# Patient Record
Sex: Male | Born: 1945
Health system: Southern US, Community
[De-identification: ages and names within clinical notes are randomized; demographics above are authoritative.]

## PROBLEM LIST (undated history)

## (undated) DIAGNOSIS — J449 Chronic obstructive pulmonary disease, unspecified: Secondary | ICD-10-CM

## (undated) DIAGNOSIS — Z5181 Encounter for therapeutic drug level monitoring: Secondary | ICD-10-CM

## (undated) DIAGNOSIS — K2970 Gastritis, unspecified, without bleeding: Secondary | ICD-10-CM

## (undated) DIAGNOSIS — I714 Abdominal aortic aneurysm, without rupture, unspecified: Secondary | ICD-10-CM

## (undated) DIAGNOSIS — I2510 Atherosclerotic heart disease of native coronary artery without angina pectoris: Secondary | ICD-10-CM

## (undated) DIAGNOSIS — I4891 Unspecified atrial fibrillation: Secondary | ICD-10-CM

## (undated) DIAGNOSIS — I10 Essential (primary) hypertension: Secondary | ICD-10-CM

## (undated) DIAGNOSIS — I499 Cardiac arrhythmia, unspecified: Secondary | ICD-10-CM

## (undated) DIAGNOSIS — F419 Anxiety disorder, unspecified: Secondary | ICD-10-CM

## (undated) DIAGNOSIS — Z79899 Other long term (current) drug therapy: Secondary | ICD-10-CM

## (undated) DIAGNOSIS — Z8719 Personal history of other diseases of the digestive system: Secondary | ICD-10-CM

## (undated) DIAGNOSIS — R51 Headache: Secondary | ICD-10-CM

## (undated) DIAGNOSIS — I739 Peripheral vascular disease, unspecified: Secondary | ICD-10-CM

## (undated) HISTORY — PX: INCISION AND DRAINAGE DEEP NECK ABSCESS: SHX1797

## (undated) HISTORY — PX: CORONARY ARTERY BYPASS GRAFT: SHX141

## (undated) HISTORY — DX: Headache: R51

## (undated) HISTORY — DX: Peripheral vascular disease, unspecified: I73.9

## (undated) HISTORY — DX: Chronic obstructive pulmonary disease, unspecified: J44.9

## (undated) HISTORY — DX: Atherosclerotic heart disease of native coronary artery without angina pectoris: I25.10

## (undated) HISTORY — DX: Essential (primary) hypertension: I10

## (undated) HISTORY — DX: Gastritis, unspecified, without bleeding: K29.70

## (undated) HISTORY — PX: ROTATOR CUFF REPAIR: SHX139

---

## 1999-05-30 ENCOUNTER — Emergency Department (HOSPITAL_COMMUNITY): Admission: EM | Admit: 1999-05-30 | Discharge: 1999-05-30 | Payer: Self-pay | Admitting: Emergency Medicine

## 1999-05-31 ENCOUNTER — Encounter: Payer: Self-pay | Admitting: Emergency Medicine

## 1999-06-08 ENCOUNTER — Ambulatory Visit (HOSPITAL_COMMUNITY): Admission: RE | Admit: 1999-06-08 | Discharge: 1999-06-08 | Payer: Self-pay | Admitting: *Deleted

## 1999-06-10 ENCOUNTER — Encounter: Payer: Self-pay | Admitting: *Deleted

## 1999-06-10 ENCOUNTER — Ambulatory Visit (HOSPITAL_COMMUNITY): Admission: RE | Admit: 1999-06-10 | Discharge: 1999-06-10 | Payer: Self-pay | Admitting: *Deleted

## 2001-02-01 ENCOUNTER — Encounter: Payer: Self-pay | Admitting: Family Medicine

## 2001-02-01 ENCOUNTER — Ambulatory Visit (HOSPITAL_COMMUNITY): Admission: RE | Admit: 2001-02-01 | Discharge: 2001-02-01 | Payer: Self-pay | Admitting: Family Medicine

## 2005-06-07 ENCOUNTER — Ambulatory Visit: Payer: Self-pay | Admitting: Orthopedic Surgery

## 2005-10-03 ENCOUNTER — Ambulatory Visit (HOSPITAL_COMMUNITY): Admission: RE | Admit: 2005-10-03 | Discharge: 2005-10-03 | Payer: Self-pay | Admitting: Family Medicine

## 2006-08-28 DIAGNOSIS — I2510 Atherosclerotic heart disease of native coronary artery without angina pectoris: Secondary | ICD-10-CM

## 2006-08-28 HISTORY — DX: Atherosclerotic heart disease of native coronary artery without angina pectoris: I25.10

## 2007-03-05 ENCOUNTER — Encounter: Payer: Self-pay | Admitting: Cardiothoracic Surgery

## 2007-03-05 ENCOUNTER — Ambulatory Visit: Payer: Self-pay | Admitting: Cardiothoracic Surgery

## 2007-03-05 ENCOUNTER — Inpatient Hospital Stay (HOSPITAL_COMMUNITY): Admission: EM | Admit: 2007-03-05 | Discharge: 2007-03-12 | Payer: Self-pay | Admitting: Emergency Medicine

## 2007-03-05 ENCOUNTER — Ambulatory Visit: Payer: Self-pay | Admitting: Cardiology

## 2007-04-12 ENCOUNTER — Ambulatory Visit: Payer: Self-pay | Admitting: Cardiothoracic Surgery

## 2007-04-12 ENCOUNTER — Encounter: Admission: RE | Admit: 2007-04-12 | Discharge: 2007-04-12 | Payer: Self-pay | Admitting: Cardiothoracic Surgery

## 2007-09-09 ENCOUNTER — Encounter: Admission: RE | Admit: 2007-09-09 | Discharge: 2007-09-09 | Payer: Self-pay | Admitting: Cardiology

## 2007-10-11 ENCOUNTER — Ambulatory Visit (HOSPITAL_COMMUNITY): Admission: RE | Admit: 2007-10-11 | Discharge: 2007-10-11 | Payer: Self-pay | Admitting: Family Medicine

## 2008-02-07 ENCOUNTER — Ambulatory Visit (HOSPITAL_COMMUNITY): Admission: RE | Admit: 2008-02-07 | Discharge: 2008-02-07 | Payer: Self-pay | Admitting: Family Medicine

## 2008-02-26 ENCOUNTER — Ambulatory Visit (HOSPITAL_COMMUNITY): Admission: RE | Admit: 2008-02-26 | Discharge: 2008-02-26 | Payer: Self-pay | Admitting: Family Medicine

## 2008-02-27 ENCOUNTER — Emergency Department (HOSPITAL_COMMUNITY): Admission: EM | Admit: 2008-02-27 | Discharge: 2008-02-27 | Payer: Self-pay | Admitting: Emergency Medicine

## 2008-04-16 ENCOUNTER — Ambulatory Visit (HOSPITAL_BASED_OUTPATIENT_CLINIC_OR_DEPARTMENT_OTHER): Admission: RE | Admit: 2008-04-16 | Discharge: 2008-04-17 | Payer: Self-pay | Admitting: Orthopedic Surgery

## 2008-09-15 ENCOUNTER — Encounter (INDEPENDENT_AMBULATORY_CARE_PROVIDER_SITE_OTHER): Payer: Self-pay | Admitting: Cardiology

## 2008-09-15 ENCOUNTER — Ambulatory Visit (HOSPITAL_COMMUNITY): Admission: RE | Admit: 2008-09-15 | Discharge: 2008-09-15 | Payer: Self-pay | Admitting: Cardiology

## 2008-09-15 ENCOUNTER — Ambulatory Visit: Payer: Self-pay | Admitting: Vascular Surgery

## 2008-11-25 ENCOUNTER — Ambulatory Visit (HOSPITAL_COMMUNITY): Admission: RE | Admit: 2008-11-25 | Discharge: 2008-11-25 | Payer: Self-pay | Admitting: Family Medicine

## 2009-01-14 ENCOUNTER — Ambulatory Visit: Payer: Self-pay | Admitting: *Deleted

## 2009-09-15 ENCOUNTER — Inpatient Hospital Stay (HOSPITAL_COMMUNITY): Admission: AD | Admit: 2009-09-15 | Discharge: 2009-09-18 | Payer: Self-pay

## 2009-09-16 ENCOUNTER — Encounter (INDEPENDENT_AMBULATORY_CARE_PROVIDER_SITE_OTHER): Payer: Self-pay

## 2010-03-14 ENCOUNTER — Ambulatory Visit (HOSPITAL_COMMUNITY)
Admission: RE | Admit: 2010-03-14 | Discharge: 2010-03-14 | Payer: Self-pay | Source: Home / Self Care | Admitting: Cardiology

## 2010-09-27 ENCOUNTER — Ambulatory Visit (HOSPITAL_COMMUNITY)
Admission: RE | Admit: 2010-09-27 | Discharge: 2010-09-27 | Payer: Self-pay | Source: Home / Self Care | Attending: Family Medicine | Admitting: Family Medicine

## 2010-10-26 ENCOUNTER — Encounter (HOSPITAL_BASED_OUTPATIENT_CLINIC_OR_DEPARTMENT_OTHER)
Admission: RE | Admit: 2010-10-26 | Discharge: 2010-10-26 | Disposition: A | Discharge status: Home / Self Care | Payer: Medicare Other | Source: Ambulatory Visit | Attending: Orthopedic Surgery | Admitting: Orthopedic Surgery

## 2010-10-26 DIAGNOSIS — Z01812 Encounter for preprocedural laboratory examination: Secondary | ICD-10-CM | POA: Insufficient documentation

## 2010-10-26 LAB — BASIC METABOLIC PANEL
Calcium: 9.1 mg/dL (ref 8.4–10.5)
Creatinine, Ser: 1.22 mg/dL (ref 0.4–1.5)
GFR calc non Af Amer: 59 mL/min — ABNORMAL LOW (ref >60)
Glucose, Bld: 85 mg/dL (ref 70–99)

## 2010-10-27 ENCOUNTER — Ambulatory Visit (HOSPITAL_BASED_OUTPATIENT_CLINIC_OR_DEPARTMENT_OTHER)
Admission: RE | Admit: 2010-10-27 | Discharge: 2010-10-28 | Disposition: A | Discharge status: Home / Self Care | Payer: Medicare Other | Source: Ambulatory Visit | Attending: Orthopedic Surgery | Admitting: Orthopedic Surgery

## 2010-10-27 DIAGNOSIS — M67919 Unspecified disorder of synovium and tendon, unspecified shoulder: Secondary | ICD-10-CM | POA: Insufficient documentation

## 2010-10-27 DIAGNOSIS — Z01812 Encounter for preprocedural laboratory examination: Secondary | ICD-10-CM | POA: Insufficient documentation

## 2010-10-27 DIAGNOSIS — Z01818 Encounter for other preprocedural examination: Secondary | ICD-10-CM | POA: Insufficient documentation

## 2010-10-27 DIAGNOSIS — M719 Bursopathy, unspecified: Secondary | ICD-10-CM | POA: Insufficient documentation

## 2010-10-27 DIAGNOSIS — M25819 Other specified joint disorders, unspecified shoulder: Secondary | ICD-10-CM | POA: Insufficient documentation

## 2010-10-27 DIAGNOSIS — Z5333 Arthroscopic surgical procedure converted to open procedure: Secondary | ICD-10-CM | POA: Insufficient documentation

## 2010-10-27 DIAGNOSIS — G8929 Other chronic pain: Secondary | ICD-10-CM | POA: Insufficient documentation

## 2010-10-27 DIAGNOSIS — M19019 Primary osteoarthritis, unspecified shoulder: Secondary | ICD-10-CM | POA: Insufficient documentation

## 2010-10-27 DIAGNOSIS — M6580 Other synovitis and tenosynovitis, unspecified site: Secondary | ICD-10-CM | POA: Insufficient documentation

## 2010-10-27 LAB — POCT HEMOGLOBIN-HEMACUE: Hemoglobin: 14.5 g/dL (ref 13.0–17.0)

## 2010-11-08 NOTE — Op Note (Signed)
NAMEMarland Kitchen  TAVARI, LOADHOLT                ACCOUNT NO.:  [id number]  MEDICAL RECORD NO.:  [id number]           PATIENT TYPE:  LOCATION:                                 FACILITY:  PHYSICIAN:  Katy Fitch. Gillian Kluever, M.D. DATE OF BIRTH:  1946-05-14  DATE OF PROCEDURE:  10/27/2010 DATE OF DISCHARGE:                              OPERATIVE REPORT   PREOPERATIVE DIAGNOSIS:  Chronic right shoulder pain with MRI documented bursal side degenerative delaminated rotator cuff tear due to chronic stage III impingement due to acromioclavicular degenerative arthritis and unfavorable acromial anatomy.  POSTOPERATIVE DIAGNOSES:  Chronic right shoulder pain with MRI documented bursal side degenerative delaminated rotator cuff tear due to chronic stage III impingement due to acromioclavicular degenerative arthritis and unfavorable acromial anatomy with confirmation of 20% biceps degenerative tear and labral degenerative tearing as well as synovitis within glenohumeral joint.  OPERATION: 1. Diagnostic arthroscopy, right glenohumeral joint. 2. Arthroscopic debridement of labrum, biceps, and deep surface of     rotator cuff tear. 3. Arthroscopic subacromial bursectomy, coracoacromial electrosurgical     release, acromioplasty. 4. Arthroscopic distal clavicle resection. 5. Open hybrid repair of supraspinatus, infraspinatus, delaminated     rotator cuff tear utilizing a McLaughlin through bone suture to     anatomically replace the supraspinatus and a FiberTape to a lateral     swivel-lock for reinforcement and compression.  OPERATING SURGEON:  Katy Fitch. Theodore Rahrig, MD  ASSISTANT:  Marveen Reeks Dasnoit, PA-C  ANESTHESIA:  General by endotracheal technique supplemented by a ropivacaine interscalene block placed with ultrasound guidance.  SUPERVISING ANESTHESIOLOGIST:  Janetta Hora. Gelene Mink, MD  INDICATIONS:  [name] is a 65 year old gentleman referred through the courtesy of Dr. Lilyan Punt of [place],  [place] for management of a right rotator cuff tear.  In 2009, we had repaired his left rotator cuff with a very satisfactory result.  With recurrent shoulder pain, he saw Dr. Gerda Diss and had plain x-ray examination and an MRI documenting a retracting supraspinatus degenerative rotator cuff tear with unfavorable AC and acromial anatomy.  [name] was referred back for followup orthopedic consult.  Clinical examination confirmed weakness of abduction and external rotation and positive impingement signs.  Given his anatomic predicament and desire to remain quite active, he requested we proceed with repair of his rotator cuff at this time.  We advised him preoperatively that we would decompress the [facility] joint, the anterior and lateral acromion, debride the necrotic rotator cuff, and repair the rotator cuff to decorticated greater tuberosity with a surgical construct utilizing anchors and through bone suture.  This was similar to the repair that was performed on his left side.  Questions were invited and answered in detail.  [name] was quite familiar with the surgery and aftercare anticipated.  PROCEDURE:  [name] was brought to room 1 at [facility] and placed in supine position on the operating table.  In the holding area, Dr. Gelene Mink had provided detailed anesthesia informed consent and placed an ultrasound-guided interscalene block.  Excellent anesthesia of the right arm and forequarter was achieved.  Under Dr. Thornton Dales direct supervision,  general endotracheal anesthesia was induced followed by careful positioning of Mr. Artola in the beach-chair position with the aid of a torso and head holder designed for shoulder arthroscopy.  The right upper extremity and forequarter was prepped with DuraPrep and draped with impervious arthroscopy drapes.  A routine surgical time-out was accomplished followed by proceeding to use a marking pencil to lay out the  anatomy of the shoulder.  The arthroscope was introduced through a standard posterior viewing portal followed by diagnostic arthroscopy of the glenohumeral joint.  The full- thickness rotator cuff tear was visualized posterior to the biceps tendon.  There was some moderate degree of necrotic tendinopathy noted. The labrum was degenerative superiorly and anteriorly and the biceps had a 20% fraying inferior tear approximately 1 cm from its origin at the superior glenoid.  An anterior portal was created under direct vision followed by use of a 4.2-mm suction shaver to debride the biceps to a stable margin, the labral tissues to a stable margin, and the deep articular side of the rotator cuff tear.  After synovectomy was accomplished, hemostasis was not problematic.  The scope was then removed from glenohumeral joint and placed in subacromial space.  A florid bursitis was noted that was thoroughly debrided with suction shaver.  The cuff tear was noted be delaminated and extending into the mid substance of the infraspinatus.  The capsule of AC joint was taken down the cutting cautery.  The osteophyte of the distal clavicle documented with a digital camera followed by use of the suction bur to remove the distal centimeter of clavicle and to perform anterior and lateral acromioplasty.  Hemostasis was achieved under direct vision with bipolar cautery.  After completion of the acromioplasty, the arthroscopic equipment was removed and an anterior middle third deltoid splitting incision accomplished.  Redundant bursa was removed and the rotator cuff tear inspected.  There was a sizable tear measuring approximately 3 cm from anterior to posterior and the superficial fibers of the supraspinatus had retracted medially at least 3 cm.  The interval between the supraspinatus and infraspinatus was laminating with a part of infraspinatus still attached to the tuberosity.  The margins of the tear were  freshened with a sharp mid-size rongeur and the tuberosity was decorticated with a suction bur with the profile of the tuberosity lowered 3 mm to bleeding cancellous bone.  The supraspinatus was gathered with a FiberTape with scorpion suture passer followed by use of a #2 FiberWire to re-laminate the layers of the supraspinatus and infraspinatus by carefully placing sutures and replacing the layers anatomically.  The #2 FiberWire was then placed through drill holes in bone with McLaughlin through bone technique and after abducting the arm, the supraspinous and infraspinatus tendons were restored to an anatomic footprint of the decorticated tuberosity.  The FiberTape was then woven through the infraspinatus to apply compression to smooth the margin of the repair and an over-the-top technique to a lateral swivel-lock was used for compression of the supraspinatus.  Hemostasis was achieved with bipolar cautery and the Bovie followed by irrigation.  The scope was placed in the glenohumeral joint from posterior approach and diagnostic arthroscopy confirmed that no sutures were following the long head biceps or other predicaments noted.  After intra-articular irrigation, the arthroscopic equipment was removed followed by repair of the portals with intradermal 3-0 Prolene and repair of the deltoid split with simple suture of 0 Vicryl, followed by repair of the subcutaneous tissue with 2-0 Vicryl and the skin with intradermal  3-0 Prolene and Steri-Strips.  For aftercare, Mr. Cindric will be admitted [facility] for observation of his vital signs.  We anticipate Ancef 1 g IV x3 doses and appropriate analgesics in the form of p.o. and IV Dilaudid once his ropivacaine block wears off.     Katy Fitch Penni Penado, M.D.     RVS/MEDQ  D:  10/27/2010  T:  10/28/2010  Job:  [id number]  cc:   Lorin Picket A. Gerda Diss, MD  Electronically Signed by Josephine Igo M.D. on 11/08/2010 01:11:16 PM

## 2010-11-12 LAB — CREATININE, SERUM: Creatinine, Ser: 1.06 mg/dL (ref 0.4–1.5)

## 2010-11-13 LAB — GRAM STAIN

## 2010-11-13 LAB — ANAEROBIC CULTURE

## 2010-11-13 LAB — BASIC METABOLIC PANEL
BUN: 14 mg/dL (ref 6–23)
CO2: 26 mEq/L (ref 19–32)
Calcium: 8.7 mg/dL (ref 8.4–10.5)
Chloride: 102 mEq/L (ref 96–112)
Glucose, Bld: 152 mg/dL — ABNORMAL HIGH (ref 70–99)

## 2010-11-13 LAB — CBC
HCT: 35.3 % — ABNORMAL LOW (ref 39.0–52.0)
Hemoglobin: 12 g/dL — ABNORMAL LOW (ref 13.0–17.0)
MCHC: 34.1 g/dL (ref 30.0–36.0)
RDW: 14.2 % (ref 11.5–15.5)

## 2010-11-13 LAB — DIFFERENTIAL
Eosinophils Absolute: 0.1 10*3/uL (ref 0.0–0.7)
Lymphs Abs: 1.5 10*3/uL (ref 0.7–4.0)
Monocytes Absolute: 0.7 10*3/uL (ref 0.1–1.0)
Neutro Abs: 8.1 10*3/uL — ABNORMAL HIGH (ref 1.7–7.7)

## 2010-11-13 LAB — WOUND CULTURE

## 2011-01-10 NOTE — Assessment & Plan Note (Signed)
OFFICE VISIT   [name], [name]  DOB:  02-12-1946                                        April 12, 2007  CHART #:  [id number]   CURRENT PROBLEMS:  1. Status post CABG x5 on March 06, 2007 for class IV unstable angina      with 3-vessel disease.  2. Hypertension.  3. History of a GI bleed from peptic ulcer disease.  4. COPD with recent smoking cessation 1 to 2 packs a day.   HISTORY OF PRESENT ILLNESS:  The patient returns for his first  postoperative office visit after CABG x5 for unstable angina.  He is  followed by Dr. Corliss Marcus and Dr. Lubertha South in [place].  He had  postoperative atrial fibrillation, but converted to sinus rhythm, and  his amiodarone had been reduced to 1 tablet daily.  He is also on  digoxin, Lopressor 12.5 b.i.d., and iron.  He has had no recurrent  angina and his surgical incisions are healing well.   PHYSICAL EXAM:  Blood pressure 120/70, pulse 60, respirations 18,  saturation 99%.  He is alert and pleasant.  Breath sounds are clear and  equal.  His cardiac rhythm is regular without gallops, murmurs, or rubs.  The sternal incision is well-healed.  The leg incision is well-healed  and there is no peripheral edema.   LABORATORY DATA:  PA and lateral chest x-ray shows clear lung fields, no  pleural effusion, and the sternal wires are well-aligned, and the  mediastinum appears intact.   IMPRESSION AND PLAN:  The patient has done well 1 month following  surgery and is ready to resume normal daily activities.  He knows not to  lift more than 20 pounds until October 1.  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  He will stop the digoxin  as  his heart rate is slow and he has maintained the sinus rhythm.  Otherwise, no prescriptions were requested and the patient will return  here as needed.   Kerin Perna, M.D.  Electronically Signed   PV/MEDQ  D:  04/12/2007  T:  04/13/2007  Job:  [id number]   cc:   Francisca December, M.D.

## 2011-01-10 NOTE — Procedures (Signed)
CAROTID DUPLEX EXAM   INDICATION:  Followup evaluation of known carotid artery disease.   HISTORY:  Diabetes:  No.  Cardiac:  Coronary artery bypass graft in July of 2008.  Atrial  fibrillation.  Hypertension:  Yes.  Smoking:  Yes.  Previous Surgery:  No.  CV History:  Previous duplex on 09/15/2008 revealed moderate right ICA  stenosis and mild left ICA stenosis.  Amaurosis Fugax No, Paresthesias No, Hemiparesis No                                       RIGHT             LEFT  Brachial systolic pressure:         116               112  Brachial Doppler waveforms:         Triphasic         Triphasic  Vertebral direction of flow:        Antegrade         Antegrade  DUPLEX VELOCITIES (cm/sec)  CCA peak systolic                   73                67  ECA peak systolic                   102               140  ICA peak systolic                   195               105  ICA end diastolic                   69                38  PLAQUE MORPHOLOGY:                  Mixed             Soft  PLAQUE AMOUNT:                      Moderate          Mild  PLAQUE LOCATION:                    Proximal ICA      Proximal ICA, ECA   IMPRESSION:  60-79% right ICA stenosis, 20-39% left ICA stenosis.       ___________________________________________  P. Liliane Bade, M.D.   MC/MEDQ  D:  01/14/2009  T:  01/14/2009  Job:  [id number]
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
## 2011-01-10 NOTE — Op Note (Signed)
NAMEMarland Kitchen  DISHAWN, BHARGAVA NO.:  [id number]   MEDICAL RECORD NO.:  [id number]          PATIENT TYPE:  INP   LOCATION:  2310                         FACILITY:  [facility]   PHYSICIAN:  Kerin Perna, M.D.  DATE OF BIRTH:  [birth year]   DATE OF PROCEDURE:  03/06/2007  DATE OF DISCHARGE:                               OPERATIVE REPORT   OPERATION:  1. Coronary artery bypass grafting x5 (left internal mammary artery to      left anterior descending, saphenous vein graft to diagonal,      saphenous vein graft to posterior descending, sequential saphenous      vein graft to obtuse marginal 1 and obtuse marginal 2).  2. Endoscopic vein harvest of both leg greater saphenous veins from      knee to groin.   SURGEON:  Kerin Perna, M.D.   ASSISTANT:  George Ina MD and Jacklynn Bue, [place]   PRE AND POSTOPERATIVE DIAGNOSIS:  Class IV unstable angina with severe  three-vessel coronary disease   ANESTHESIA:  General.   INDICATIONS:  The patient is a 65 year old male with COPD and active  smoking who has had exertional and nocturnal chest pain.  Cardiac  enzymes were negative when he presented to the emergency department and  Dr. Corliss Marcus proceeded with cardiac catheterization which  demonstrated high-grade 99% stenosis of the LAD diagonal with TIMI II  flow and high-grade stenosis of the circumflex right coronary arteries  as well.  Overall EF was fairly well-preserved.  He is felt to be  candidate for surgical revascularization.  Prior to surgery I reviewed  the patient's cardiac cath with the patient and family and discussed  indications and expected benefits of coronary bypass surgery for  treatment of his coronary artery disease.  I reviewed the alternatives  to surgical therapy as well.  I discussed the major aspects of the  planned operation including the choice of conduit to include internal  mammary artery and endoscopically harvested saphenous vein, location of  the surgical incisions and the use of general anesthesia and  cardiopulmonary bypass.  I discussed with the patient the risks to him  of coronary bypass surgery including risks of MI, CVA, bleeding, stroke,  infection and death.  After reviewing these issues, he demonstrated his  understanding and agreed to proceed with operation under what I felt was  an informed consent.   OPERATIVE FINDINGS:  The vein was of good quality from both legs.  The  mammary artery was small less than 1.5 mm but had good flow.  The LAD  was intramyocardial.  The diagonal and circumflex marginal vessels were  diffusely diseased.  The proximal right coronary was heavily diseased  and the right coronary graft was placed in the posterior descending.   PROCEDURE:  The patient was brought to operative and placed supine on  the operating table where general anesthesia was induced under invasive  hemodynamic monitoring.  The chest, abdomen and legs were prepped with  Betadine and draped as a sterile field.  A sternal incision was made as  the saphenous  vein was harvested endoscopically.  The left internal  mammary artery was harvested as a pedicle graft from its origin at the  subclavian vessels.  Heparin was administered and ACT was documented as  being therapeutic.  The sternal retractor was placed in the pericardium  was opened and suspended.  Pursestrings were placed in the ascending  aorta and right atrium and after the vein had been examined and found to  be adequate, the patient was cannulated and placed on bypass.  The  coronaries were identified for grafting and the mammary artery and vein  grafts were prepared for the distal anastomoses.  Cardioplegia catheters  were placed for both antegrade and retrograde cardioplegia.  The patient  was cooled to 30 degrees and aortic crossclamp was applied.  800 mL of  cold blood cardioplegia was delivered in split doses between the  antegrade aortic and retrograde  coronary catheters.  There is good  cardioplegic arrest and septal temperature dropped less than 12 degrees.   The distal coronary anastomoses were performed.  During the crossclamp  period, cardioplegia was delivered every 20 minutes.  The first distal  anastomosis was to posterior descending branch of right coronary.  This  was a 1.5-mm vessel and had a proximal 80% stenosis.  Reverse saphenous  vein was sewn end-to-side with running 7-0 Prolene.  There is good flow  through the graft.  The second distal anastomosis was to the diagonal  branch to LAD.  This a 1.4-mm vessel proximal 90% stenosis and reverse  saphenous vein was sewn end-to-side with running 7-0 Prolene with good  flow through graft.  The third and fourth distal anastomoses consisted  of a sequential vein graft to the OM1 and OM2.  The OM1 was a 1.5-mm  vessel with proximal 50% stenosis.  A side-to-side anastomosis with the  vein was constructed using running 7-0 Prolene.  The fourth distal  anastomosis was a continuation of this vein to the OM II which was a 1.5-  mm vessel with proximal 80% stenosis.  An end-to-side anastomosis was  constructed using running 7-0 Prolene.  There is good flow through  graft.  Cardioplegia was redosed.   The fifth distal anastomosis was to the mid LAD where it became  epicardial from its more proximal intramyocardial location.  The left  IMA pedicle was brought through an opening created in the left lateral  pericardium and was brought down onto the LAD and sewn end-to-side with  running 8-0 Prolene.  There is good flow through the anastomosis after  briefly releasing the pedicle bulldog on the mammary artery.  The  bulldog was reapplied and the pedicle secured epicardium.  Cardioplegia  was redosed.   While the crossclamp was still in place three proximal vein anastomoses  were performed on the ascending aorta using a 4.0-mm punch and running 6-  0 Prolene.  Prior to tying down the  final proximal anastomosis, air was  vented from the coronaries with a dose of retrograde warm blood  cardioplegia and the usual de-airing maneuvers on bypass.  The  crossclamp was then removed and the heart was reperfused.  The heart  resumed a spontaneous rhythm.  Air was aspirated from the vein grafts  and these were opened.  Each had good flow and hemostasis was documented  at the proximal distal sites.  Cardioplegia catheters were removed.  Temporary pacing wires were applied and after the patient had been  adequately rewarmed and reperfused, the lungs re-expanded.  The  ventilator was resumed.  The patient was then weaned from bypass on low-  dose dopamine with good cardiac output and stable blood pressure.  Protamine was administered without adverse reaction.  The cannulas were  removed.  The mediastinum was irrigated warm antibiotic irrigation.  Leg  incisions were irrigated and closed in a standard fashion.  The superior  pericardial fat was closed over the aorta.  Two mediastinal and left  pleural chest tube were placed brought through separate incisions.  The  sternum was closed interrupted steel wire.  The pectoralis fascia was  closed in running #1 Vicryl.  The subcutaneous and skin layers were  closed in running Vicryl and sterile dressings were applied.  Total  bypass time was 140 minutes with crossclamp time of 94 minutes.      Kerin Perna, M.D.  Electronically Signed     PV/MEDQ  D:  03/06/2007  T:  03/07/2007  Job:  [id number]   cc:   Francisca December, M.D.  Donna Bernard, M.D.

## 2011-01-10 NOTE — Cardiovascular Report (Signed)
[name], [name]                ACCOUNT NO.:  [id number]   MEDICAL RECORD NO.:  [id number]          PATIENT TYPE:  INP   LOCATION:  6525                         FACILITY:  [facility]   PHYSICIAN:  Francisca December, M.D.  DATE OF BIRTH:  12/20/1945   DATE OF PROCEDURE:  03/05/2007  DATE OF DISCHARGE:                            CARDIAC CATHETERIZATION   PROCEDURES PERFORMED:  1. Left heart catheterization.  2. Left ventriculogram.  3. Coronary angiography.   INDICATIONS:  Mr. [name] is a 65 year old man who has presented  with predominantly right-sided chest pain radiating to the right upper  arm.  He had three prolonged episodes.  No associated symptoms.  He was  admitted yesterday evening after a prolonged episode earlier in the day.  Initial cardiac enzymes were negative.  Subsequent troponin 0.11.  There  are  electrocardiographic changes of anterior ischemia.  He is brought  to catheterization laboratory at this time to identify the extent of  disease and provide for further therapeutic options.   PROCEDURE:  The patient is brought to cardiac catheterization laboratory  in fasting state.  The right groin was prepped and draped in the usual  sterile fashion.  Local anesthesia was obtained with infiltration of 1%  lidocaine.  A long 6-French catheter sheath was inserted percutaneously  into the right femoral artery utilizing an anterior approach over a  guiding J-wire.  A Wholey wire was required to place the catheter in the  femoral artery.  The J-wire persisted in turning retrograde.  There was  tortuosity in the right femoral artery.  Therefore a long sheath was  placed.  The 110 cm pigtail catheter was then used to measure pressures  in the ascending aorta and left ventricle both prior to and following  the ventriculogram.  A 30 degrees RAO cine left ventriculogram was  performed in a 30 degrees RAO angulation.  A coronary angiography then  proceeded using 6-French #4 left  and right Judkins catheters.  Cineangiography of each coronary was conducted in multiple LAO and RAO  projections.  At completion of the procedure the right femoral  arteriogram in the 45 degrees RAO angulation identified an aneurysm in  the distal femoral artery which is where the catheter entered and then a  significant stenosis just at the opening or neck to the aneurysm in the  range of 70-80%.  The femoral artery itself is tortuous and highly  diseased.  There is a 50% stenosis in the common femoral portion of the  artery.  Therefore, no AngioSeal was undertaken.   The patient was then transported to the recovery area where the sheath  was removed and he hemostasis achieved by direct pressure.   HEMODYNAMIC RESULTS:  Systemic arterial pressure was 148/68 with mean of  101 mmHg.  There was no systolic gradients across the aortic valve.  The  left ventricular end-diastolic pressure was 5 mmHg pre ventriculogram.   ANGIOGRAPHY:  The left ventriculogram demonstrated normal chamber size  and hyperdynamic global systolic function without regional wall motion  abnormality.  A visual estimate of the ejection fraction  is 80%.  There  is no mitral regurgitation and the aortic valve is trileaflet and opens  normally during systole.  There is left coronary calcification seen.   There is a right-dominant coronary system present.  The main left  coronary artery is short and normal.   The left anterior descending artery and its branches are highly  diseased; the vessel displays a subtotal/99% stenosis in the midportion  just at the bifurcation of the ongoing anterior descending artery and a  moderate-to-large sized diagonal branch.  There is ulceration seen in  the bifurcation LAD portion as well as some lucency suggestive of  thrombus.  The ongoing anterior descending artery reaches but does not  traverse the apex.  The vessel is under filled with does appear to have  a 50% mid to distal  narrowing and the diagonal has a 40% mid to distal  narrowing.   The left circumflex coronary artery is large and itself demonstrates no  significant disease.  There is a 20% narrowing in the proximal segment.  More distal portion of the artery divides into two large marginal  branches.  The superior marginal branch has a 50% narrowing proximally  and the inferior marginal branch has a 70% narrowing at the ostium.  These vessels go on to provide perfusion at the apex.   The right coronary artery and its branches are highly diseased; the  vessel has an ostial 80-90% narrowing.  There was pressure damping with  cannulation of the artery.  The midportion of the vessel then  demonstrates a tubular 70% narrowing.  The ongoing vessel then  demonstrates a 20% mid to distal narrowing and then the distal segment  is without obstruction.  The distal vessel divides into a moderate size  posterior descending artery and a small posterolateral segment and 2  small left ventricular branches.   Collateral vessels are not seen.   FINAL IMPRESSION:  1. Atherosclerotic coronary vascular disease, three-vessel.  2. Intact left ventricular size and hyperdynamic systolic function  3. 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   PLAN:  The patient will be initiated on IV nitroglycerin.  Anticoagulation is indicated despite his prior history of GI bleeding.  We will initiate at 6-12 hours after sheath removal.  A cardiac surgical  consult will be obtained.      Francisca December, M.D.  Electronically Signed     JHE/MEDQ  D:  03/05/2007  T:  03/05/2007  Job:  [id number]

## 2011-01-10 NOTE — Consult Note (Signed)
VASCULAR SURGERY CONSULTATION   [name], [name]  DOB:  03-Mar-1946                                       01/14/2009  EAVWU#:[id number]   REFERRING PHYSICIAN:  Corliss Marcus, MD.   REFERRAL DIAGNOSIS:  Carotid artery occlusive disease.   HISTORY:  The patient is a 65 year old gentleman with a history of  coronary artery disease, underwent coronary artery bypass in 2008 by Dr.  Donata Clay.   He has a history of heavy tobacco use in the past.  Continues to chew  tobacco daily.   He has an abnormal carotid Doppler.  Today in the office his carotid  reveals a 60-79% stenosis and left ICA minimal stenosis of 20-39%.  No  history of stroke.  Denies sensory, motor or visual deficit.  No speech  problems.  No gait abnormality.   Risk factors for cerebrovascular disease include tobacco use,  hypertension and coronary artery disease.   PAST MEDICAL HISTORY:  1. Coronary artery disease status post coronary artery bypass.  2. Hyperlipidemia.  3. Hypertension.  4. COPD.  5. Tobacco abuse.   MEDICATIONS:  1. Hydrochlorothiazide 25 mg daily.  2. Lisinopril 20 mg daily.  3. Metoprolol 25 mg 1/2 tablet b.i.d.  4. Pravastatin 40 mg 2 tablets daily.  5. Aspirin 325 mg daily.  6. Multivitamin 1 tablet daily.  7. Vitamin C 500 mg daily.   ALLERGIES:  DOXYCYCLINE causes a rash and hives.   SOCIAL HISTORY:  The patient is married with two children.  He works as  an Radio broadcast assistant.  He was smoking one pack of cigarettes daily  for 47 years, discontinued this in 2008.  Discontinued chew tobacco.  No  regular alcohol use.   REVIEW OF SYSTEMS:  Refer to patient encounter form.  The patient denies  any recent significant symptoms.   FAMILY HISTORY:  Mother is living age 67 with a history of congestive  heart failure.  Father deceased age 54 from a myocardial infarction.   PHYSICAL EXAM:  General:  A well-appearing 65 year old gentleman.  Alert  and oriented.  No  distress.  Vital signs:  BP is 125/83 in the left arm,  122/81 in the right arm, pulse is 57 per minute and regular.  HEENT:  Mouth and throat are clear.  Normocephalic.  Extraocular movements  intact.  Neck:  Supple.  No thyromegaly or adenopathy.  Chest:  Equal  air entry bilaterally without rales or rhonchi.  Cardiovascular:  Normal  heart sounds without murmurs.  No gallops or rubs.  No carotid bruits.  Regular rate and rhythm.  Abdomen:  Soft, nontender.  Normal bowel  sounds without bruits.  No masses or organomegaly.  Extremities:  No  peripheral edema.  Neurological:  Cranial nerves intact.  Strength equal  bilaterally.  1+ reflexes.  Skin:  Intact without rash or ulceration.   IMPRESSION:  1. Asymptomatic moderate to severe right internal carotid artery      stenosis.  2. Coronary artery disease.  3. Hyperlipidemia.  4. Hypertension.  5. Chronic obstructive pulmonary disease.  6. Tobacco abuse.   RECOMMENDATIONS:  The patient has moderate asymptomatic right internal  carotid artery stenosis, recommend 6 month followup with carotid  Doppler.  Discontinuation of tobacco use.  Continue current medications  for chronic medical conditions.   Balinda Quails, M.D.  Electronically  Signed  PGH/MEDQ  D:  01/14/2009  T:  01/15/2009  Job:  [id number]   cc:   Francisca December, M.D.  Donna Bernard, M.D.
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
## 2011-01-10 NOTE — H&P (Signed)
NAMEMarland Moyer  FREDI, GEILER NO.:  [id number]   MEDICAL RECORD NO.:  [id number]          PATIENT TYPE:  INP   LOCATION:  1824                         FACILITY:  [facility]   PHYSICIAN:  Vernice Jefferson, MD          DATE OF BIRTH:  [birth year]   DATE OF ADMISSION:  03/04/2007  DATE OF DISCHARGE:                              HISTORY & PHYSICAL   CHIEF COMPLAINT:  Chest pain.   HISTORY OF PRESENT ILLNESS:  Patient is a 65 year old white male with  hypertension and tobacco abuse only who comes in with a 3-4 day history  of right-sided chest pain that is somewhat pleuritic in nature and worse  when lying on his right side, not associated with exertion.  There are  no associated symptoms with this.  The patient states that the chest  pain did get worse and lasted longer than its usual 10 minutes.  He  additionally states it went across his precordium to the center of the  chest.  He does not have any left-sided pressure.  There is no dyspnea.  No diaphoresis, no nausea.  Patient is currently chest painfree.  Never  had a stress test and previously is being controlled for his  hypertension by his primary care Matheus Spiker in [place].   PAST MEDICAL HISTORY:  1. Hypertension.  2. Peptic ulcer disease, history of a GI bleed 2-3 years ago.   MEDICATIONS:  Vasotec and aspirin p.r.n. only.   ALLERGIES:  No known drug allergies.   SOCIAL HISTORY:  He is a 45-pack-year smoker, currently smoking a pack  and a half a day.  No alcohol.  No drug use.   FAMILY HISTORY:  His father had an MI at the age of 18 but no maternal  history of early cardiomyopathy or heart attack.   REVIEW OF SYSTEMS:  Negative 11-point review of systems except for  otherwise dictated in the above HPI.   PHYSICAL EXAMINATION:  VITAL SIGNS:  Blood pressure is 185/96, heart  rate is 66, afebrile.  GENERAL:  A well-developed and well-nourished white male in no acute  distress.  HEENT:  Moist mucous membranes.  No  scleral icterus.  No conjunctival  pallor.  NECK:  Supple.  Full range of motion.  No jugular venous distention.  No  carotid bruits noted.  CARDIOVASCULAR:  Regular rate and rhythm without murmurs, rubs or  gallops.  CHEST:  Clear to auscultation bilaterally.  No wheezes, rales, or  rhonchi.  ABDOMEN:  Soft, nontender, nondistended.  Normoactive bowel sounds.  EXTREMITIES:  No peripheral edema.  Pulses are 2+ bilaterally.  NEURO:  Nonfocal.   Chest x-ray is negative for acute infiltrates, hyperinflated lung fields  consistent with COPD.   EKG demonstrates a normal sinus rhythm with LVH and likely  repolarization abnormality.   LABORATORY DATA:  Hemoglobin 14, platelets 235, BUN and creatinine of 10  and 1.2.  Glucose 94.  First set of cardiac biomarkers are negative.   ASSESSMENT:  1. Acute coronary syndrome, unstable angina.  2. Hypertension.  3. Tobacco abuse.  4.  A positive family history for cardiovascular disease.   PLAN:  Will admit the patient to telemetry under Dr. Amil Amen service  since he is unassigned, rule out with serial biomarkers.  Given his  history of GI bleed, atypical story, and TIMI risk factor of 2-3, will  hold heparin, at least for now, and follow his biomarkers.  If positive,  actually will restart meds.  He has gotten aspirin therapy here in the  ED.  Beta blocker and Norvasc for his hypertension at the present.  Likely will need noninvasive risk stratification in the a.m.  Will let  Dr. Amil Amen decide and will keep patient n.p.o.      Vernice Jefferson, MD  Electronically Signed     JT/MEDQ  D:  03/05/2007  T:  03/05/2007  Job:  [id number]

## 2011-01-10 NOTE — Op Note (Signed)
NAMEMarland Kitchen  [name], [name]                ACCOUNT NO.:  [id number]   MEDICAL RECORD NO.:  [id number]          PATIENT TYPE:  AMB   LOCATION:  DSC                          FACILITY:  [facility]   PHYSICIAN:  Katy Fitch. Sypher, M.D. DATE OF BIRTH:  [birth year]   DATE OF PROCEDURE:  DATE OF DISCHARGE:                               OPERATIVE REPORT   PREOPERATIVE DIAGNOSIS:  Complex chronic retracted 3 tendon rotator cuff  tear, left shoulder.   POSTOPERATIVE DIAGNOSES:  Complex chronic retracted 3 tendon rotator  cuff tear, left shoulder with confirmation of significant  acromioclavicular degenerative arthritis.   OPERATION:  1. Open reconstruction of left rotator cuff tear with repair of      supraspinatus, infraspinatus, and delaminated teres minor tendon      with 4 McLaughlin 3 bone sutures and 2 over-the-top sutures.  2. Open resection of distal clavicle and subacromial decompression      including medial acromial osteophyte at [facility] joint.   OPERATING SURGEON:  Katy Fitch. Sypher, MD.   ASSISTANT:  None.   ANESTHESIA:  General by endotracheal technique supplemented at the  conclusion of procedure by 2% lidocaine intradermal and intra-articular  2% lidocaine.   SUPERVISING ANESTHESIOLOGIST:  Germaine Pomfret, MD   INDICATIONS:  [name] is a 65 year old gentleman referred  through the courtesy of Dr. Lubertha South of [place] for evaluation of  a chronic left shoulder impairment.  [name] has multiple background  medical problems including coronary artery disease, hypertension,  history of GI bleeding due to peptic ulcer disease, and chronic smoking.  He is status post coronary artery bypass graft surgery in 2008 for class  4 unstable angina.  He had a treadmill test in January 2009 that  revealed no signs of ischemia.  He subsequently saw Dr. Amil Amen, his  cardiologist for a preoperative screening visit in July 2009 and was  advised he was a safe candidate for general  anesthesia for a left  rotator cuff reconstruction.   Preoperatively, he was interviewed by Dr. Gypsy Balsam and Dr. Jairo Ben.  We discussed whether or not he was a candidate for an  infraclavicular block repair after pain management.   Given his history of some breathing difficulties following his coronary  bypass surgery, I elected to defer an infraclavicular block out of  concern that this would compromise his phrenic nerve.   After a lengthy informed consent in the office and once again in the  holding area, [name] was brought to the operating room at this time  anticipating primary repair of his complex rotator cuff predicament.   PROCEDURE:  [name] was brought to the operating room and  placed in supine position on the operating table.   Preoperatively, Dr. Jean Rosenthal had performed informed consent.  He was  brought to room 2, placed in supine position on the operating table and  under Dr. Edison Pace direct supervision, general anesthesia by  endotracheal technique was induced.   He was carefully positioned in a beach-chair position with aid of a  torso and headholder designed for  shoulder arthroscopy.  The left upper  extremity and forequarter were prepped with DuraPrep and draped with  impervious arthroscopy drapes.   The procedure commenced with a 6-cm incision from the distal clavicle  across the anterior acromion.  The anterior third of the deltoid was  elevated on a periosteal flap off of the [facility] joint capsule and anterior  acromion.  A type 3 acromion was noted.  After clearing bursa, a very  large retracted rotator cuff tear extending from the subscapularis  anteriorly all the way to the midportion of the teres minor was noted.  There was delamination and layered retraction of the teres minor.  The  supraspinatus and infraspinatus retracted to within 1 cm of the glenoid.   A very extensive mobilization of rotator the cuff was accomplished with  sharp  dissection using scissors, a osteotome, and a Cobb elevator.  Extensive bursectomy was accomplished.  The acromion was leveled to a  type 1 morphology and the distal 15 mm clavicle were dissected with the  oscillating saw.  The acromion was carefully tailored on its  undersurface removing the medial osteophyte at the [facility] joint.  The  rotator cuff was then gathered with a series of grasping sutures of #2  FiberWire.  The posterior delamination of the teres minor was  relaminated by weaving suture between the various layers, ultimately  achieving the anatomic footprint to decorticated bone.  This was  repaired with a through bone suture and a through 10 suture.  The  infraspinatus and supraspinatus were advanced through [facility] bone  tunnels to an anatomic footprint followed by placement of a medial  marked suture utilizing an RC needle creating a deep trans-bone tunnel  for mattress suture of the medial footprint of the rotator cuff.   An anatomic reconstruction of the cuff was achieved.  The tails of the  through bone sutures were then placed with over-the-top simple technique  to create good inset and profile of the repair.   The long head of the biceps was noted to be intact.   The wound was thoroughly lavaged with sterile saline followed by  meticulous repair of the capsule of the [facility] joint reconstructing the  origin of the deltoid followed by repair of the periosteum and the  deltoid over the anterior acromion, creating an anatomic reconstruction  of deltoid origin.  The split of the deltoid laterally was repaired with  simple suture of 0 Vicryl.  The skin was repaired with subcutaneous  suture of 2-0 Vicryl and intradermal 2-0 Prolene with Steri-Strips.   [name] was placed in a compressive dressing with sterile gauze and  Tegaderm.  For aftercare, we anticipate [facility] for observation of his vital signs.  He will be placed on PCA  morphine as  well as oral Dilaudid.   We anticipate Ancef 1 g IV q.8 h. x3 doses of prophylactic antibiotic  and was discharged him on oral Keflex.   He is noted to be allergic to VIBRAMYCIN.  He also has a history of GI  bleeding.  We will use nonsteroidal medication for only a few days in  the perioperative period for pain control and we will quickly  discontinue the nonsteroidal medication to prevent GI upset.      Katy Fitch Sypher, M.D.  Electronically Signed     RVS/MEDQ  D:  04/16/2008  T:  04/17/2008  Job:  [id number]   cc:   Donna Bernard, M.D.

## 2011-06-12 LAB — CBC
HCT: 24.6 — ABNORMAL LOW
Hemoglobin: 8.3 — ABNORMAL LOW
MCHC: 33.9
MCV: 95
Platelets: 229
RBC: 2.59 — ABNORMAL LOW
RDW: 14.3 — ABNORMAL HIGH
WBC: 10.9 — ABNORMAL HIGH

## 2011-06-13 LAB — COMPREHENSIVE METABOLIC PANEL
ALT: 11
ALT: 14
AST: 18
Albumin: 3 — ABNORMAL LOW
Alkaline Phosphatase: 70
BUN: 12
BUN: 12
CO2: 27
CO2: 29
Calcium: 8.4
Calcium: 8.6
Chloride: 105
Creatinine, Ser: 0.85
Creatinine, Ser: 1.04
GFR calc Af Amer: >60
GFR calc non Af Amer: >60
GFR calc non Af Amer: >60
Glucose, Bld: 114 — ABNORMAL HIGH
Glucose, Bld: 97
Potassium: 4.3
Sodium: 137
Total Bilirubin: 0.7
Total Protein: 6
Total Protein: 6.1

## 2011-06-13 LAB — CBC
HCT: 23.3 — ABNORMAL LOW
HCT: 25.8 — ABNORMAL LOW
HCT: 25.9 — ABNORMAL LOW
HCT: 26.5 — ABNORMAL LOW
HCT: 28.8 — ABNORMAL LOW
HCT: 31.9 — ABNORMAL LOW
HCT: 32.5 — ABNORMAL LOW
Hemoglobin: 10.8 — ABNORMAL LOW
Hemoglobin: 10.8 — ABNORMAL LOW
Hemoglobin: 12.8 — ABNORMAL LOW
Hemoglobin: 14
Hemoglobin: 7.9 — CL
Hemoglobin: 8.7 — ABNORMAL LOW
Hemoglobin: 8.7 — ABNORMAL LOW
Hemoglobin: 9.2 — ABNORMAL LOW
Hemoglobin: 9.7 — ABNORMAL LOW
MCHC: 33.2
MCHC: 33.6
MCHC: 33.7
MCHC: 33.7
MCHC: 33.8
MCHC: 33.9
MCHC: 34.1
MCHC: 34.6
MCV: 94.5
MCV: 95.3
MCV: 95.6
MCV: 96
MCV: 96.2
MCV: 96.2
MCV: 96.3
Platelets: 113 — ABNORMAL LOW
Platelets: 120 — ABNORMAL LOW
Platelets: 125 — ABNORMAL LOW
Platelets: 134 — ABNORMAL LOW
Platelets: 146 — ABNORMAL LOW
Platelets: 147 — ABNORMAL LOW
Platelets: 147 — ABNORMAL LOW
Platelets: 160
RBC: 2.41 — ABNORMAL LOW
RBC: 2.7 — ABNORMAL LOW
RBC: 2.7 — ABNORMAL LOW
RBC: 2.8 — ABNORMAL LOW
RBC: 3.02 — ABNORMAL LOW
RBC: 3.32 — ABNORMAL LOW
RBC: 3.38 — ABNORMAL LOW
RBC: 4.41
RDW: 13.8
RDW: 13.9
RDW: 14
RDW: 14
RDW: 14
RDW: 14.2 — ABNORMAL HIGH
RDW: 14.3 — ABNORMAL HIGH
RDW: 14.4 — ABNORMAL HIGH
RDW: 14.8 — ABNORMAL HIGH
WBC: 10.9 — ABNORMAL HIGH
WBC: 12.9 — ABNORMAL HIGH
WBC: 13.8 — ABNORMAL HIGH
WBC: 14.5 — ABNORMAL HIGH
WBC: 15.4 — ABNORMAL HIGH
WBC: 17.5 — ABNORMAL HIGH
WBC: 18 — ABNORMAL HIGH

## 2011-06-13 LAB — I-STAT 8, (EC8 V) (CONVERTED LAB)
Acid-Base Excess: 2
BUN: 10
Bicarbonate: 28.1 — ABNORMAL HIGH
Chloride: 107
HCT: 43
Hemoglobin: 14.6
Operator id: 282201
Sodium: 141
pCO2, Ven: 48.4

## 2011-06-13 LAB — POCT I-STAT 4, (NA,K, GLUC, HGB,HCT)
Glucose, Bld: 96
HCT: 25 — ABNORMAL LOW
HCT: 26 — ABNORMAL LOW
HCT: 33 — ABNORMAL LOW
HCT: 36 — ABNORMAL LOW
Hemoglobin: 11.2 — ABNORMAL LOW
Hemoglobin: 12.2 — ABNORMAL LOW
Hemoglobin: 12.9 — ABNORMAL LOW
Hemoglobin: 8.8 — ABNORMAL LOW
Operator id: 3342
Potassium: 3.3 — ABNORMAL LOW
Potassium: 4
Potassium: 4.1
Potassium: 5.9 — ABNORMAL HIGH
Sodium: 134 — ABNORMAL LOW
Sodium: 136
Sodium: 138
Sodium: 139
Sodium: 146 — ABNORMAL HIGH

## 2011-06-13 LAB — POCT CARDIAC MARKERS
Myoglobin, poc: 43
Myoglobin, poc: 51.5
Operator id: 272551
Troponin i, poc: <0.05

## 2011-06-13 LAB — APTT
aPTT: 35
aPTT: 38 — ABNORMAL HIGH
aPTT: 43 — ABNORMAL HIGH

## 2011-06-13 LAB — BASIC METABOLIC PANEL
BUN: 15
BUN: 17
BUN: 9
CO2: 24
CO2: 25
CO2: 27
CO2: 29
Calcium: 8 — ABNORMAL LOW
Calcium: 8.3 — ABNORMAL LOW
Calcium: 8.3 — ABNORMAL LOW
Calcium: 8.5
Chloride: 106
Chloride: 113 — ABNORMAL HIGH
Creatinine, Ser: 0.87
Creatinine, Ser: 1.03
Creatinine, Ser: 1.08
GFR calc Af Amer: >60
GFR calc Af Amer: >60
GFR calc non Af Amer: >60
GFR calc non Af Amer: >60
Glucose, Bld: 112 — ABNORMAL HIGH
Glucose, Bld: 112 — ABNORMAL HIGH
Glucose, Bld: 122 — ABNORMAL HIGH
Glucose, Bld: 83
Potassium: 4
Potassium: 4
Potassium: 4.4
Sodium: 138
Sodium: 138
Sodium: 143

## 2011-06-13 LAB — I-STAT EC8
BUN: 16
Bicarbonate: 24.2 — ABNORMAL HIGH
Chloride: 101
Glucose, Bld: 109 — ABNORMAL HIGH
pCO2 arterial: 45.6 — ABNORMAL HIGH
pH, Arterial: 7.334 — ABNORMAL LOW

## 2011-06-13 LAB — DIFFERENTIAL
Basophils Absolute: 0
Basophils Relative: 1
Lymphocytes Relative: 32
Monocytes Absolute: 0.5
Monocytes Relative: 5
Neutro Abs: 5.4
Neutrophils Relative %: 60

## 2011-06-13 LAB — POCT I-STAT 3, ART BLOOD GAS (G3+)
Acid-base deficit: 1
Bicarbonate: 22
Bicarbonate: 23.9
Bicarbonate: 24.1 — ABNORMAL HIGH
Bicarbonate: 24.5 — ABNORMAL HIGH
O2 Saturation: 100
O2 Saturation: 95
O2 Saturation: 96
Operator id: 257021
Operator id: 274841
Operator id: 3342
Patient temperature: 37
TCO2: 23
TCO2: 25
TCO2: 26
pCO2 arterial: 37.9
pCO2 arterial: 40.5
pCO2 arterial: 42.5
pCO2 arterial: 48.7 — ABNORMAL HIGH
pH, Arterial: 7.303 — ABNORMAL LOW
pH, Arterial: 7.41
pO2, Arterial: 313 — ABNORMAL HIGH
pO2, Arterial: 57 — ABNORMAL LOW
pO2, Arterial: 79 — ABNORMAL LOW

## 2011-06-13 LAB — URINALYSIS, ROUTINE W REFLEX MICROSCOPIC
Bilirubin Urine: NEGATIVE
Glucose, UA: NEGATIVE
Hgb urine dipstick: NEGATIVE
Ketones, ur: NEGATIVE
Nitrite: NEGATIVE
Nitrite: NEGATIVE
Protein, ur: 30 — AB
Protein, ur: NEGATIVE
Specific Gravity, Urine: 1.012
Urobilinogen, UA: 1
Urobilinogen, UA: 1
pH: 7

## 2011-06-13 LAB — BLOOD GAS, ARTERIAL
Acid-base deficit: 0.2
Bicarbonate: 24
FIO2: 0.21
O2 Saturation: 94.9
Patient temperature: 98.6
TCO2: 25.2
pCO2 arterial: 39.5
pH, Arterial: 7.401
pO2, Arterial: 73.4 — ABNORMAL LOW

## 2011-06-13 LAB — CK TOTAL AND CKMB (NOT AT ARMC)
CK, MB: 2.6
Total CK: 117

## 2011-06-13 LAB — URINE CULTURE: Culture: NO GROWTH

## 2011-06-13 LAB — HEMOGLOBIN A1C
Hgb A1c MFr Bld: 5.8
Hgb A1c MFr Bld: 6.2 — ABNORMAL HIGH

## 2011-06-13 LAB — URINE MICROSCOPIC-ADD ON

## 2011-06-13 LAB — TROPONIN I: Troponin I: 0.11 — ABNORMAL HIGH

## 2011-06-13 LAB — CREATININE, SERUM
Creatinine, Ser: 0.74
Creatinine, Ser: 1.27
GFR calc Af Amer: >60
GFR calc Af Amer: >60
GFR calc non Af Amer: 57 — ABNORMAL LOW
GFR calc non Af Amer: >60

## 2011-06-13 LAB — PLATELET COUNT: Platelets: 155

## 2011-06-13 LAB — POCT I-STAT CREATININE: Creatinine, Ser: 1.2

## 2011-06-13 LAB — CARDIAC PANEL(CRET KIN+CKTOT+MB+TROPI)
Relative Index: 2.5
Total CK: 111
Troponin I: 0.09 — ABNORMAL HIGH

## 2011-06-13 LAB — PROTIME-INR
INR: 0.9
INR: 1
INR: 1.3
Prothrombin Time: 12.4
Prothrombin Time: 13
Prothrombin Time: 16.2 — ABNORMAL HIGH

## 2011-06-13 LAB — TYPE AND SCREEN
ABO/RH(D): A POS
Antibody Screen: NEGATIVE

## 2011-06-13 LAB — HEMOGLOBIN AND HEMATOCRIT, BLOOD: Hemoglobin: 8.8 — ABNORMAL LOW

## 2011-06-13 LAB — POCT I-STAT 3, VENOUS BLOOD GAS (G3P V)
O2 Saturation: 83
TCO2: 25
pCO2, Ven: 52.9 — ABNORMAL HIGH
pO2, Ven: 55 — ABNORMAL HIGH

## 2011-06-13 LAB — MAGNESIUM
Magnesium: 2.5
Magnesium: 2.5
Magnesium: 2.7 — ABNORMAL HIGH

## 2011-06-13 LAB — LIPID PANEL
Triglycerides: 97
VLDL: 19

## 2011-06-13 LAB — POCT I-STAT GLUCOSE: Operator id: 156951

## 2011-06-13 LAB — ABO/RH: ABO/RH(D): A POS

## 2011-10-09 DIAGNOSIS — J209 Acute bronchitis, unspecified: Secondary | ICD-10-CM | POA: Diagnosis not present

## 2011-10-09 DIAGNOSIS — J0100 Acute maxillary sinusitis, unspecified: Secondary | ICD-10-CM | POA: Diagnosis not present

## 2011-10-23 DIAGNOSIS — J40 Bronchitis, not specified as acute or chronic: Secondary | ICD-10-CM | POA: Diagnosis not present

## 2011-10-25 DIAGNOSIS — I6529 Occlusion and stenosis of unspecified carotid artery: Secondary | ICD-10-CM | POA: Diagnosis not present

## 2011-11-15 ENCOUNTER — Encounter (INDEPENDENT_AMBULATORY_CARE_PROVIDER_SITE_OTHER): Payer: Self-pay | Admitting: General Surgery

## 2011-11-20 ENCOUNTER — Encounter (INDEPENDENT_AMBULATORY_CARE_PROVIDER_SITE_OTHER): Payer: Self-pay | Admitting: General Surgery

## 2011-11-20 ENCOUNTER — Ambulatory Visit (INDEPENDENT_AMBULATORY_CARE_PROVIDER_SITE_OTHER): Payer: Medicare Other | Admitting: General Surgery

## 2011-11-20 VITALS — BP 130/60 | HR 48 | Temp 98.0°F | Resp 16 | Ht 65.0 in | Wt 149.6 lb

## 2011-11-20 DIAGNOSIS — L905 Scar conditions and fibrosis of skin: Secondary | ICD-10-CM

## 2011-11-20 NOTE — Progress Notes (Signed)
Patient ID: [name], male   DOB: 11-03-1945, 66 y.o.   MRN: [id number]  Chief Complaint  Patient presents with  . Cyst    infected cyst on neck    HPI [name] is a 66 y.o. male.  He returns for a wound check.  On September 16, 2009 this gentleman was taken to the operating room for debridement of a complex carbuncle on his posterior neck. Cultures grew MRSA. The wound healed by secondary intention.  His wife is concerned because of what looks like a depression and hole in the wound and also because the area itches. The patient has noticed no pain, no tenderness, no drainage, and no redness to the skin. HPI  Past Medical History  Diagnosis Date  . Hypertension   . CAD (coronary artery disease)     Past Surgical History  Procedure Date  . Coronary artery bypass graft   . Incision and drainage deep neck abscess     No family history on file.  Social History History  Substance Use Topics  . Smoking status: Not on file  . Smokeless tobacco: Not on file  . Alcohol Use:     Allergies not on file  Current Outpatient Prescriptions  Medication Sig Dispense Refill  . Ascorbic Acid (VITAMIN C) 100 MG tablet Take 100 mg by mouth daily.      [name] aspirin 81 MG tablet Take 81 mg by mouth daily.      [name] atorvastatin (LIPITOR) 40 MG tablet Take 40 mg by mouth daily.      . fish oil-omega-3 fatty acids 1000 MG capsule Take 2 capsules by mouth daily.      [name] ALPRAZolam (XANAX) 0.5 MG tablet Take 0.5 mg by mouth 1 day or 1 dose.      [name] lisinopril (PRINIVIL,ZESTRIL) 20 MG tablet Take 20 mg by mouth 2 (two) times daily.      . metoprolol tartrate (LOPRESSOR) 25 MG tablet Take 25 mg by mouth 2 (two) times daily. One half pill in AM and one half pill in PM      . Multiple Vitamins-Minerals (MULTIVITAMIN WITH MINERALS) tablet Take 1 tablet by mouth daily.        Review of Systems Review of Systems  Constitutional: Negative for fever, chills and unexpected weight change.  HENT:  Negative for hearing loss, congestion, sore throat, trouble swallowing and voice change.   Eyes: Negative for visual disturbance.  Respiratory: Negative for cough and wheezing.   Cardiovascular: Negative for chest pain, palpitations and leg swelling.  Gastrointestinal: Negative for nausea, vomiting, abdominal pain, diarrhea, constipation, blood in stool, abdominal distention, anal bleeding and rectal pain.  Genitourinary: Negative for hematuria and difficulty urinating.  Musculoskeletal: Negative for arthralgias.  Skin: Negative for rash and wound.  Neurological: Negative for seizures, syncope, weakness and headaches.  Hematological: Negative for adenopathy. Does not bruise/bleed easily.  Psychiatric/Behavioral: Negative for confusion.    Blood pressure 130/60, pulse 48, temperature 98 F (36.7 C), resp. rate 16, height 5\' 5"  (1.651 m), weight 149 lb 9.6 oz (67.858 kg).  Physical Exam Physical Exam  Constitutional: He is oriented to person, place, and time. He appears well-developed and well-nourished. No distress.  HENT:  Head: Normocephalic and atraumatic.  Eyes: Scleral icterus is present.  Neck: Normal range of motion. Neck supple. No JVD present. No tracheal deviation present. No thyromegaly present.  Cardiovascular: Normal rate, regular rhythm and normal heart sounds.   Lymphadenopathy:    He  has no cervical adenopathy.  Neurological: He is alert and oriented to person, place, and time.  Skin: Skin is warm and dry. No rash noted. He is not diaphoretic. No erythema. No pallor.       Complex, depressed scar posterior neck just below the hairline. This is completely healed. There is no tenderness, no drainage, no mass, and no adenopathy.  Psychiatric: He has a normal mood and affect. His behavior is normal. Judgment and thought content normal.    Data Reviewed   Assessment    History complex MRSA carbuncle posterior neck.  Scar exam reveals complete healing without evidence  of recurrent infection.    Plan    The patient and his wife were reassured.  Return to see me p.r.n.       Angelia Mould. Derrell Lolling, M.D., [facility] General and Minimally invasive Surgery Breast and Colorectal Surgery Office:   [phone] Pager:   [phone]  11/20/2011, 4:15 PM

## 2011-11-20 NOTE — Patient Instructions (Signed)
The scar on the back of your neck has healed normally. There is no evidence of infection. Nothing further needs to be done.  Return to see Dr. Derrell Lolling if any new problems arise.

## 2011-11-23 DIAGNOSIS — I2510 Atherosclerotic heart disease of native coronary artery without angina pectoris: Secondary | ICD-10-CM | POA: Diagnosis not present

## 2011-11-23 DIAGNOSIS — I4949 Other premature depolarization: Secondary | ICD-10-CM | POA: Diagnosis not present

## 2011-11-23 DIAGNOSIS — I6529 Occlusion and stenosis of unspecified carotid artery: Secondary | ICD-10-CM | POA: Diagnosis not present

## 2011-11-23 DIAGNOSIS — E7800 Pure hypercholesterolemia, unspecified: Secondary | ICD-10-CM | POA: Diagnosis not present

## 2011-11-23 DIAGNOSIS — I10 Essential (primary) hypertension: Secondary | ICD-10-CM | POA: Diagnosis not present

## 2011-12-19 DIAGNOSIS — J218 Acute bronchiolitis due to other specified organisms: Secondary | ICD-10-CM | POA: Diagnosis not present

## 2011-12-19 DIAGNOSIS — J449 Chronic obstructive pulmonary disease, unspecified: Secondary | ICD-10-CM | POA: Diagnosis not present

## 2011-12-19 DIAGNOSIS — E785 Hyperlipidemia, unspecified: Secondary | ICD-10-CM | POA: Diagnosis not present

## 2011-12-19 DIAGNOSIS — I10 Essential (primary) hypertension: Secondary | ICD-10-CM | POA: Diagnosis not present

## 2011-12-26 DIAGNOSIS — Z125 Encounter for screening for malignant neoplasm of prostate: Secondary | ICD-10-CM | POA: Diagnosis not present

## 2011-12-26 DIAGNOSIS — E785 Hyperlipidemia, unspecified: Secondary | ICD-10-CM | POA: Diagnosis not present

## 2011-12-26 DIAGNOSIS — Z79899 Other long term (current) drug therapy: Secondary | ICD-10-CM | POA: Diagnosis not present

## 2012-01-23 DIAGNOSIS — Z0000 Encounter for general adult medical examination without abnormal findings: Secondary | ICD-10-CM | POA: Diagnosis not present

## 2012-06-03 DIAGNOSIS — J019 Acute sinusitis, unspecified: Secondary | ICD-10-CM | POA: Diagnosis not present

## 2012-07-05 DIAGNOSIS — I6529 Occlusion and stenosis of unspecified carotid artery: Secondary | ICD-10-CM | POA: Diagnosis not present

## 2012-07-12 DIAGNOSIS — Z23 Encounter for immunization: Secondary | ICD-10-CM | POA: Diagnosis not present

## 2012-07-18 DIAGNOSIS — I10 Essential (primary) hypertension: Secondary | ICD-10-CM | POA: Diagnosis not present

## 2012-07-18 DIAGNOSIS — I2510 Atherosclerotic heart disease of native coronary artery without angina pectoris: Secondary | ICD-10-CM | POA: Diagnosis not present

## 2012-07-18 DIAGNOSIS — I209 Angina pectoris, unspecified: Secondary | ICD-10-CM | POA: Diagnosis not present

## 2012-07-18 DIAGNOSIS — I6529 Occlusion and stenosis of unspecified carotid artery: Secondary | ICD-10-CM | POA: Diagnosis not present

## 2012-07-18 DIAGNOSIS — I4891 Unspecified atrial fibrillation: Secondary | ICD-10-CM | POA: Diagnosis not present

## 2012-08-05 DIAGNOSIS — J42 Unspecified chronic bronchitis: Secondary | ICD-10-CM | POA: Diagnosis not present

## 2012-08-05 DIAGNOSIS — J310 Chronic rhinitis: Secondary | ICD-10-CM | POA: Diagnosis not present

## 2012-09-02 ENCOUNTER — Encounter: Payer: Self-pay | Admitting: Vascular Surgery

## 2012-09-18 DIAGNOSIS — L570 Actinic keratosis: Secondary | ICD-10-CM | POA: Diagnosis not present

## 2012-09-18 DIAGNOSIS — D2370 Other benign neoplasm of skin of unspecified lower limb, including hip: Secondary | ICD-10-CM | POA: Diagnosis not present

## 2012-09-18 DIAGNOSIS — D485 Neoplasm of uncertain behavior of skin: Secondary | ICD-10-CM | POA: Diagnosis not present

## 2012-09-18 DIAGNOSIS — L821 Other seborrheic keratosis: Secondary | ICD-10-CM | POA: Diagnosis not present

## 2012-12-04 ENCOUNTER — Other Ambulatory Visit: Payer: Self-pay | Admitting: Gastroenterology

## 2012-12-04 DIAGNOSIS — Z09 Encounter for follow-up examination after completed treatment for conditions other than malignant neoplasm: Secondary | ICD-10-CM | POA: Diagnosis not present

## 2012-12-04 DIAGNOSIS — K5730 Diverticulosis of large intestine without perforation or abscess without bleeding: Secondary | ICD-10-CM | POA: Diagnosis not present

## 2012-12-04 DIAGNOSIS — Z86010 Personal history of colonic polyps: Secondary | ICD-10-CM | POA: Diagnosis not present

## 2012-12-04 DIAGNOSIS — D126 Benign neoplasm of colon, unspecified: Secondary | ICD-10-CM | POA: Diagnosis not present

## 2012-12-05 ENCOUNTER — Other Ambulatory Visit (HOSPITAL_COMMUNITY): Payer: Self-pay | Admitting: Family Medicine

## 2012-12-18 ENCOUNTER — Telehealth: Payer: Self-pay | Admitting: Family Medicine

## 2012-12-18 ENCOUNTER — Other Ambulatory Visit: Payer: Self-pay

## 2012-12-18 DIAGNOSIS — Z125 Encounter for screening for malignant neoplasm of prostate: Secondary | ICD-10-CM | POA: Diagnosis not present

## 2012-12-18 DIAGNOSIS — E782 Mixed hyperlipidemia: Secondary | ICD-10-CM

## 2012-12-18 DIAGNOSIS — Z79899 Other long term (current) drug therapy: Secondary | ICD-10-CM

## 2012-12-18 NOTE — Telephone Encounter (Signed)
BW papers for visit on 5/30

## 2012-12-18 NOTE — Telephone Encounter (Signed)
Lip Liv Met7 PSA ordered in system and faxed to [facility]. Left message on answering machine notifying patient.

## 2012-12-18 NOTE — Telephone Encounter (Signed)
Lip/liv/met 7/psa 

## 2012-12-30 DIAGNOSIS — Z125 Encounter for screening for malignant neoplasm of prostate: Secondary | ICD-10-CM | POA: Diagnosis not present

## 2012-12-30 DIAGNOSIS — Z79899 Other long term (current) drug therapy: Secondary | ICD-10-CM | POA: Diagnosis not present

## 2012-12-30 DIAGNOSIS — E782 Mixed hyperlipidemia: Secondary | ICD-10-CM | POA: Diagnosis not present

## 2012-12-30 LAB — BASIC METABOLIC PANEL
BUN: 12 mg/dL (ref 6–23)
CO2: 28 mEq/L (ref 19–32)
Chloride: 106 mEq/L (ref 96–112)
Creat: 1.13 mg/dL (ref 0.50–1.35)

## 2012-12-30 LAB — HEPATIC FUNCTION PANEL
Alkaline Phosphatase: 91 U/L (ref 39–117)
Indirect Bilirubin: 0.4 mg/dL (ref 0.0–0.9)
Total Bilirubin: 0.5 mg/dL (ref 0.3–1.2)

## 2012-12-30 LAB — LIPID PANEL: LDL Cholesterol: 55 mg/dL (ref 0–99)

## 2013-01-10 DIAGNOSIS — I6529 Occlusion and stenosis of unspecified carotid artery: Secondary | ICD-10-CM | POA: Diagnosis not present

## 2013-01-14 ENCOUNTER — Telehealth: Payer: Self-pay | Admitting: Family Medicine

## 2013-01-14 NOTE — Telephone Encounter (Signed)
Message:  Mr. [name] has an upcoming appointment @ [facility] with Dr. Mitzi Hansen, Anne Fu. The office is needing a copy of Mr. [name] last blood work to be faxed to them at [phone]. ** Mr. [name] last blood work was on 12/18/12. If you have any additional question and you cannot reach the patient on their land line, the cell phone number is [phone].

## 2013-01-14 NOTE — Telephone Encounter (Signed)
bloodwork faxed and pt notified by Matthias Hughs

## 2013-01-14 NOTE — Telephone Encounter (Signed)
Faxed over lab work encounter 12/18/12 to [facility](Dr. Anne Fu) & called patient to reconfirm on fax going out.-kal

## 2013-01-17 DIAGNOSIS — E7800 Pure hypercholesterolemia, unspecified: Secondary | ICD-10-CM | POA: Diagnosis not present

## 2013-01-17 DIAGNOSIS — I2510 Atherosclerotic heart disease of native coronary artery without angina pectoris: Secondary | ICD-10-CM | POA: Diagnosis not present

## 2013-01-17 DIAGNOSIS — I6529 Occlusion and stenosis of unspecified carotid artery: Secondary | ICD-10-CM | POA: Diagnosis not present

## 2013-01-17 DIAGNOSIS — I10 Essential (primary) hypertension: Secondary | ICD-10-CM | POA: Diagnosis not present

## 2013-01-17 DIAGNOSIS — F17200 Nicotine dependence, unspecified, uncomplicated: Secondary | ICD-10-CM | POA: Diagnosis not present

## 2013-01-21 ENCOUNTER — Encounter: Payer: Self-pay | Admitting: *Deleted

## 2013-01-24 ENCOUNTER — Telehealth: Payer: Self-pay | Admitting: Family Medicine

## 2013-01-24 ENCOUNTER — Encounter: Payer: Self-pay | Admitting: Family Medicine

## 2013-01-24 ENCOUNTER — Ambulatory Visit (INDEPENDENT_AMBULATORY_CARE_PROVIDER_SITE_OTHER): Payer: Medicare Other | Admitting: Family Medicine

## 2013-01-24 VITALS — Ht 66.5 in | Wt 151.8 lb

## 2013-01-24 DIAGNOSIS — E785 Hyperlipidemia, unspecified: Secondary | ICD-10-CM

## 2013-01-24 DIAGNOSIS — I25810 Atherosclerosis of coronary artery bypass graft(s) without angina pectoris: Secondary | ICD-10-CM

## 2013-01-24 DIAGNOSIS — I10 Essential (primary) hypertension: Secondary | ICD-10-CM

## 2013-01-24 DIAGNOSIS — Z0000 Encounter for general adult medical examination without abnormal findings: Secondary | ICD-10-CM

## 2013-01-24 DIAGNOSIS — J449 Chronic obstructive pulmonary disease, unspecified: Secondary | ICD-10-CM | POA: Diagnosis not present

## 2013-01-24 NOTE — Progress Notes (Signed)
Subjective:    Patient ID: [name], male    DOB: 05-27-1946, 67 y.o.   MRN: [id number]  HPI  Trying to watch diet "average". Sticking with meds. Results for orders placed in visit on 12/18/12  LIPID PANEL      Result Value Range   Cholesterol 109  0 - 200 mg/dL   Triglycerides 55  <409 mg/dL   HDL 43  >81 mg/dL   Total CHOL/HDL Ratio 2.5     VLDL 11  0 - 40 mg/dL   LDL Cholesterol 55  0 - 99 mg/dL  HEPATIC FUNCTION PANEL      Result Value Range   Total Bilirubin 0.5  0.3 - 1.2 mg/dL   Bilirubin, Direct 0.1  0.0 - 0.3 mg/dL   Indirect Bilirubin 0.4  0.0 - 0.9 mg/dL   Alkaline Phosphatase 91  39 - 117 U/L   AST 20  0 - 37 U/L   ALT 14  0 - 53 U/L   Total Protein 6.8  6.0 - 8.3 g/dL   Albumin 3.9  3.5 - 5.2 g/dL  BASIC METABOLIC PANEL      Result Value Range   Sodium 142  135 - 145 mEq/L   Potassium 4.2  3.5 - 5.3 mEq/L   Chloride 106  96 - 112 mEq/L   CO2 28  19 - 32 mEq/L   Glucose, Bld 93  70 - 99 mg/dL   BUN 12  6 - 23 mg/dL   Creat 1.91  4.78 - 2.95 mg/dL   Calcium 9.2  8.4 - 62.1 mg/dL  PSA, MEDICARE      Result Value Range   PSA 0.85  <=4.00 ng/mL   Staying active. Walking some. No chest pain. Somewhat short of breath when exercising excessively. Review of Systems  Constitutional: Negative for fever, activity change and appetite change.  HENT: Negative for congestion, rhinorrhea and neck pain.   Eyes: Negative for discharge.  Respiratory: Negative for cough and wheezing.   Cardiovascular: Negative for chest pain.  Gastrointestinal: Negative for vomiting, abdominal pain and blood in stool.  Genitourinary: Negative for frequency and difficulty urinating.  Skin: Negative for rash.  Allergic/Immunologic: Negative for environmental allergies and food allergies.  Neurological: Negative for weakness and headaches.  Psychiatric/Behavioral: Negative for agitation.       Objective:   Physical Exam  Vitals reviewed. Constitutional: He appears well-developed  and well-nourished.  HENT:  Head: Normocephalic and atraumatic.  Right Ear: External ear normal.  Left Ear: External ear normal.  Nose: Nose normal.  Mouth/Throat: Oropharynx is clear and moist.  Eyes: EOM are normal. Pupils are equal, round, and reactive to light.  Neck: Normal range of motion. Neck supple. No thyromegaly present.  Cardiovascular: Normal rate, regular rhythm and normal heart sounds.   No murmur heard. Pulmonary/Chest: Effort normal and breath sounds normal. No respiratory distress. He has no wheezes.  Breath sounds diffusely diminished but no obvious wheezes or crackles  Abdominal: Soft. Bowel sounds are normal. He exhibits no distension and no mass. There is no tenderness.  Genitourinary: Penis normal.  Musculoskeletal: Normal range of motion. He exhibits no edema.  Lymphadenopathy:    He has no cervical adenopathy.  Neurological: He is alert. He exhibits normal muscle tone.  Skin: Skin is warm and dry. No erythema.  Psychiatric: He has a normal mood and affect. His behavior is normal. Judgment normal.          Assessment & Plan:  Impression #1 wellness exam. #2 hypertension good control. #3 COPD stable. #4 coronary artery disease. #5 carotid stenosis followed by specialist. Plan diet exercise discussed. Maintain same medications. Blood work reviewed.

## 2013-01-24 NOTE — Telephone Encounter (Signed)
done

## 2013-01-24 NOTE — Telephone Encounter (Signed)
Patient needs a copy of his lab work mailed to him

## 2013-01-26 DIAGNOSIS — J449 Chronic obstructive pulmonary disease, unspecified: Secondary | ICD-10-CM | POA: Insufficient documentation

## 2013-01-26 DIAGNOSIS — E785 Hyperlipidemia, unspecified: Secondary | ICD-10-CM | POA: Insufficient documentation

## 2013-01-26 DIAGNOSIS — I25810 Atherosclerosis of coronary artery bypass graft(s) without angina pectoris: Secondary | ICD-10-CM | POA: Insufficient documentation

## 2013-01-26 DIAGNOSIS — I2510 Atherosclerotic heart disease of native coronary artery without angina pectoris: Secondary | ICD-10-CM | POA: Insufficient documentation

## 2013-01-26 DIAGNOSIS — I10 Essential (primary) hypertension: Secondary | ICD-10-CM | POA: Insufficient documentation

## 2013-02-14 ENCOUNTER — Other Ambulatory Visit: Payer: Self-pay | Admitting: Family Medicine

## 2013-04-11 ENCOUNTER — Other Ambulatory Visit: Payer: Self-pay | Admitting: Family Medicine

## 2013-06-03 ENCOUNTER — Other Ambulatory Visit: Payer: Self-pay | Admitting: Cardiology

## 2013-06-03 MED ORDER — LISINOPRIL 20 MG PO TABS: 20.0000 mg | ORAL_TABLET | Freq: Two times a day (BID) | ORAL | Status: DC

## 2013-06-04 ENCOUNTER — Telehealth: Payer: Self-pay | Admitting: Cardiology

## 2013-06-04 MED ORDER — LISINOPRIL 20 MG PO TABS: 20.0000 mg | ORAL_TABLET | Freq: Two times a day (BID) | ORAL | Status: DC

## 2013-06-04 NOTE — Telephone Encounter (Signed)
New message    Refill linsinopril at [facility]

## 2013-06-05 ENCOUNTER — Other Ambulatory Visit: Payer: Self-pay | Admitting: *Deleted

## 2013-06-05 ENCOUNTER — Other Ambulatory Visit: Payer: Self-pay | Admitting: Family Medicine

## 2013-06-05 MED ORDER — ALPRAZOLAM 0.5 MG PO TABS: 0.5000 mg | ORAL_TABLET | ORAL | Status: DC

## 2013-06-05 NOTE — Telephone Encounter (Signed)
Ok plus two ref 

## 2013-06-05 NOTE — Telephone Encounter (Signed)
Last office visit 01/24/13

## 2013-06-12 ENCOUNTER — Other Ambulatory Visit: Payer: Self-pay | Admitting: Family Medicine

## 2013-07-01 DIAGNOSIS — Z23 Encounter for immunization: Secondary | ICD-10-CM | POA: Diagnosis not present

## 2013-07-02 NOTE — Telephone Encounter (Signed)
Ok plus 5 ref 

## 2013-07-11 ENCOUNTER — Telehealth: Payer: Self-pay | Admitting: Family Medicine

## 2013-07-11 DIAGNOSIS — E782 Mixed hyperlipidemia: Secondary | ICD-10-CM

## 2013-07-11 DIAGNOSIS — Z79899 Other long term (current) drug therapy: Secondary | ICD-10-CM

## 2013-07-11 NOTE — Telephone Encounter (Signed)
Does patient need BW paperwork?

## 2013-07-13 NOTE — Telephone Encounter (Signed)
Lip and liv

## 2013-07-14 DIAGNOSIS — Z79899 Other long term (current) drug therapy: Secondary | ICD-10-CM | POA: Diagnosis not present

## 2013-07-14 DIAGNOSIS — E782 Mixed hyperlipidemia: Secondary | ICD-10-CM | POA: Diagnosis not present

## 2013-07-14 NOTE — Telephone Encounter (Signed)
Blood work ordered in [facility]. Left message on voicemail notifying patient.

## 2013-07-15 LAB — HEPATIC FUNCTION PANEL
ALT: 10 U/L (ref 0–53)
Albumin: 3.9 g/dL (ref 3.5–5.2)
Alkaline Phosphatase: 76 U/L (ref 39–117)
Indirect Bilirubin: 0.4 mg/dL (ref 0.0–0.9)
Total Protein: 6.6 g/dL (ref 6.0–8.3)

## 2013-07-15 LAB — LIPID PANEL
HDL: 46 mg/dL (ref >39)
LDL Cholesterol: 62 mg/dL (ref 0–99)
Total CHOL/HDL Ratio: 2.6 Ratio
Triglycerides: 67 mg/dL (ref <150)
VLDL: 13 mg/dL (ref 0–40)

## 2013-07-16 ENCOUNTER — Ambulatory Visit (HOSPITAL_COMMUNITY): Payer: Medicare Other | Attending: Cardiovascular Disease

## 2013-07-16 ENCOUNTER — Encounter: Payer: Self-pay | Admitting: Cardiovascular Disease

## 2013-07-16 ENCOUNTER — Encounter (INDEPENDENT_AMBULATORY_CARE_PROVIDER_SITE_OTHER): Payer: Self-pay

## 2013-07-16 DIAGNOSIS — E785 Hyperlipidemia, unspecified: Secondary | ICD-10-CM | POA: Insufficient documentation

## 2013-07-16 DIAGNOSIS — Z87891 Personal history of nicotine dependence: Secondary | ICD-10-CM | POA: Insufficient documentation

## 2013-07-16 DIAGNOSIS — I10 Essential (primary) hypertension: Secondary | ICD-10-CM | POA: Diagnosis not present

## 2013-07-16 DIAGNOSIS — I2510 Atherosclerotic heart disease of native coronary artery without angina pectoris: Secondary | ICD-10-CM | POA: Diagnosis not present

## 2013-07-16 DIAGNOSIS — I658 Occlusion and stenosis of other precerebral arteries: Secondary | ICD-10-CM | POA: Diagnosis not present

## 2013-07-16 DIAGNOSIS — I6529 Occlusion and stenosis of unspecified carotid artery: Secondary | ICD-10-CM | POA: Insufficient documentation

## 2013-07-16 DIAGNOSIS — R42 Dizziness and giddiness: Secondary | ICD-10-CM | POA: Insufficient documentation

## 2013-07-17 ENCOUNTER — Encounter (HOSPITAL_COMMUNITY): Payer: Self-pay | Admitting: Interventional Cardiology

## 2013-07-21 ENCOUNTER — Ambulatory Visit (INDEPENDENT_AMBULATORY_CARE_PROVIDER_SITE_OTHER): Payer: Medicare Other | Admitting: Cardiology

## 2013-07-21 ENCOUNTER — Encounter: Payer: Self-pay | Admitting: Cardiology

## 2013-07-21 VITALS — BP 142/90 | HR 62 | Ht 66.5 in | Wt 147.0 lb

## 2013-07-21 DIAGNOSIS — I779 Disorder of arteries and arterioles, unspecified: Secondary | ICD-10-CM

## 2013-07-21 DIAGNOSIS — J449 Chronic obstructive pulmonary disease, unspecified: Secondary | ICD-10-CM

## 2013-07-21 DIAGNOSIS — I6529 Occlusion and stenosis of unspecified carotid artery: Secondary | ICD-10-CM

## 2013-07-21 DIAGNOSIS — I10 Essential (primary) hypertension: Secondary | ICD-10-CM | POA: Diagnosis not present

## 2013-07-21 DIAGNOSIS — I25810 Atherosclerosis of coronary artery bypass graft(s) without angina pectoris: Secondary | ICD-10-CM | POA: Diagnosis not present

## 2013-07-21 DIAGNOSIS — E785 Hyperlipidemia, unspecified: Secondary | ICD-10-CM

## 2013-07-21 HISTORY — DX: Disorder of arteries and arterioles, unspecified: I77.9

## 2013-07-21 NOTE — Patient Instructions (Addendum)
Your physician recommends that you continue on your current medications as directed. Please refer to the Current Medication list given to you today.  Your physician has requested that you have a carotid duplex in 6 months.. This test is an ultrasound of the carotid arteries in your neck. It looks at blood flow through these arteries that supply the brain with blood. Allow one hour for this exam. There are no restrictions or special instructions.   Your physician wants you to follow-up in: 1 year with Dr. Anne Fu. You will receive a reminder letter in the mail two months in advance. If you don't receive a letter, please call our office to schedule the follow-up appointment.
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
## 2013-07-22 ENCOUNTER — Ambulatory Visit (INDEPENDENT_AMBULATORY_CARE_PROVIDER_SITE_OTHER): Payer: Medicare Other | Admitting: Family Medicine

## 2013-07-22 ENCOUNTER — Encounter: Payer: Self-pay | Admitting: Family Medicine

## 2013-07-22 VITALS — BP 128/84 | Ht 65.0 in | Wt 149.0 lb

## 2013-07-22 DIAGNOSIS — I779 Disorder of arteries and arterioles, unspecified: Secondary | ICD-10-CM | POA: Diagnosis not present

## 2013-07-22 DIAGNOSIS — J449 Chronic obstructive pulmonary disease, unspecified: Secondary | ICD-10-CM | POA: Diagnosis not present

## 2013-07-22 DIAGNOSIS — I25810 Atherosclerosis of coronary artery bypass graft(s) without angina pectoris: Secondary | ICD-10-CM | POA: Diagnosis not present

## 2013-07-22 DIAGNOSIS — E785 Hyperlipidemia, unspecified: Secondary | ICD-10-CM

## 2013-07-22 MED ORDER — ALPRAZOLAM 0.5 MG PO TABS: 0.5000 mg | ORAL_TABLET | ORAL | Status: DC

## 2013-07-22 MED ORDER — ATORVASTATIN CALCIUM 40 MG PO TABS: 40.0000 mg | ORAL_TABLET | Freq: Every day | ORAL | Status: DC

## 2013-07-22 MED ORDER — LISINOPRIL 20 MG PO TABS: 20.0000 mg | ORAL_TABLET | Freq: Two times a day (BID) | ORAL | Status: DC

## 2013-07-22 NOTE — Progress Notes (Signed)
  Subjective:    Patient ID: [name], male    DOB: 10-30-1945, 67 y.o.   MRN: [id number]  HPIHere for a med check up. No concerns.   Has had flu vaccine.   Staying real active, exercising regularly,  Breathing is stable. No excessive shortness of breath.  No chest pain. Sticking with aspirin faithfully,  Takes it in the morn, no exertional chest pain.  Compliant with lipid medications. Trying to watch fat and diet.  Compliant with blood pressure medicine. Watching salt in diet.   Review of Systems No chest pain no headache and back pain occasional shortness of breath with heavy exertion no nausea no diaphoresis no change in bowel habits no blood in stool ROS otherwise negative    Objective:   Physical Exam Alert HEENT normal. Lungs clear. Heart regular in rhythm. Ankles without edema. Blood pressure good on repeat. 136 or 78 Results for orders placed in visit on 07/11/13  LIPID PANEL      Result Value Range   Cholesterol 121  0 - 200 mg/dL   Triglycerides 67  <409 mg/dL   HDL 46  >81 mg/dL   Total CHOL/HDL Ratio 2.6     VLDL 13  0 - 40 mg/dL   LDL Cholesterol 62  0 - 99 mg/dL  HEPATIC FUNCTION PANEL      Result Value Range   Total Bilirubin 0.6  0.3 - 1.2 mg/dL   Bilirubin, Direct 0.2  0.0 - 0.3 mg/dL   Indirect Bilirubin 0.4  0.0 - 0.9 mg/dL   Alkaline Phosphatase 76  39 - 117 U/L   AST 17  0 - 37 U/L   ALT 10  0 - 53 U/L   Total Protein 6.6  6.0 - 8.3 g/dL   Albumin 3.9  3.5 - 5.2 g/dL         Assessment & Plan:  Impression 1 hypertension good control. #2 hyperlipidemia controlled good discuss. #3 coronary artery disease clinically silent. #4 COPD clinically silent. Plan patient is artery had flu shot. Diet exercise discussed. Maintain same medications. Warning signs discussed. Check every 6 months. WSL

## 2013-09-04 ENCOUNTER — Ambulatory Visit (INDEPENDENT_AMBULATORY_CARE_PROVIDER_SITE_OTHER): Payer: Medicare Other | Admitting: Family Medicine

## 2013-09-04 ENCOUNTER — Encounter: Payer: Self-pay | Admitting: Family Medicine

## 2013-09-04 VITALS — BP 110/68 | Temp 98.2°F | Ht 65.0 in | Wt 150.5 lb

## 2013-09-04 DIAGNOSIS — J329 Chronic sinusitis, unspecified: Secondary | ICD-10-CM | POA: Diagnosis not present

## 2013-09-04 MED ORDER — CEPHALEXIN 500 MG PO CAPS: 500.0000 mg | ORAL_CAPSULE | Freq: Four times a day (QID) | ORAL | Status: AC

## 2013-09-04 NOTE — Progress Notes (Signed)
   Subjective:    Patient ID: [name], male    DOB: Apr 25, 1946, 68 y.o.   MRN: [id number]  Cough This is a new problem. The current episode started in the past 7 days. The problem has been unchanged. The problem occurs constantly. The cough is non-productive. Associated symptoms include myalgias, nasal congestion and a sore throat. Nothing aggravates the symptoms. Treatments tried: theraflu. The treatment provided no relief.    Muscle aches, started mon night  Throat sore  No energy, no appetite  Notes congestion in chest, coughing a fair amnt  Headache  achey in joints   Review of Systems  HENT: Positive for sore throat.   Respiratory: Positive for cough.   Musculoskeletal: Positive for myalgias.   No vomiting no diarrhea no rash ROS otherwise negative    Objective:   Physical Exam  Alert mild malaise. Vital stable. HEENT mild nasal congestion pharynx normal neck supple. Lungs clear. Heart regular in rhythm.      Assessment & Plan:  Impression 1 rhinosinusitis likely post mild case of flu. Discussed plan Keflex 4 times a day 10 days. Symptomatic care discussed. WSL

## 2013-09-05 ENCOUNTER — Telehealth: Payer: Self-pay | Admitting: Family Medicine

## 2013-09-05 MED ORDER — HYDROCODONE-HOMATROPINE 5-1.5 MG/5ML PO SYRP: 5.0000 mL | ORAL_SOLUTION | Freq: Every evening | ORAL | Status: DC | PRN

## 2013-09-05 NOTE — Telephone Encounter (Signed)
Left message on voicemail to return call.

## 2013-09-05 NOTE — Telephone Encounter (Signed)
Patient stated he would like the hycodan prescribed. [name] stated she will be by the office in 45 mintues to pick up the script.

## 2013-09-05 NOTE — Telephone Encounter (Signed)
Pt seen yesterday for mild case of the flu according to wife  He has been coughing terribly since last night and they want to  Know if you can call in something for the cough?   [name]

## 2013-09-05 NOTE — Telephone Encounter (Signed)
Choices currently is Tessalon 3 times a day(#21 no refills) when necessary or Hycodan 1 teaspoon each bedtime when necessary-cautioned drowsiness not for long-term use (4 ounces no refills), please discuss with the family

## 2013-09-23 ENCOUNTER — Encounter: Payer: Self-pay | Admitting: Family Medicine

## 2013-09-23 ENCOUNTER — Ambulatory Visit (INDEPENDENT_AMBULATORY_CARE_PROVIDER_SITE_OTHER): Payer: Medicare Other | Admitting: Family Medicine

## 2013-09-23 VITALS — BP 132/88 | Temp 98.1°F | Ht 65.0 in | Wt 148.8 lb

## 2013-09-23 DIAGNOSIS — J209 Acute bronchitis, unspecified: Secondary | ICD-10-CM

## 2013-09-23 MED ORDER — LEVOFLOXACIN 500 MG PO TABS: 500.0000 mg | ORAL_TABLET | Freq: Every day | ORAL | Status: DC

## 2013-09-23 NOTE — Progress Notes (Signed)
   Subjective:    Patient ID: [name], male    DOB: March 22, 1946, 68 y.o.   MRN: [id number]  Cough This is a recurrent problem. The current episode started 1 to 4 weeks ago. The problem has been unchanged. The problem occurs every few minutes. The cough is productive of sputum. Associated symptoms include myalgias, nasal congestion and rhinorrhea. Associated symptoms comments: Pain in left shoulder that started yesterday. The symptoms are aggravated by lying down. He has tried OTC cough suppressant and prescription cough suppressant for the symptoms. The treatment provided mild relief.   cough productive of yellowish phlegm. Left posterior back pain.. Scapular worse with motions.    Review of Systems  HENT: Positive for rhinorrhea.   Respiratory: Positive for cough.   Musculoskeletal: Positive for myalgias.   ROS otherwise negative     Objective:   Physical Exam Alert bronchial cough during exam H&T moderate his congestion neck supple. Lungs no crackles no tachypnea some rhonchi no wheezes heart regular in rhythm. Left. Scapular tenderness.       Assessment & Plan:  Impression acute bronchitis plan Levaquin daily 10 days. Symptomatic care discussed. Back pain potentially related secondary to cough, but this does not represent pleurisy discussed. WSL

## 2013-11-12 DIAGNOSIS — M7020 Olecranon bursitis, unspecified elbow: Secondary | ICD-10-CM | POA: Diagnosis not present

## 2013-12-08 ENCOUNTER — Telehealth: Payer: Self-pay | Admitting: Family Medicine

## 2013-12-08 DIAGNOSIS — Z125 Encounter for screening for malignant neoplasm of prostate: Secondary | ICD-10-CM

## 2013-12-08 DIAGNOSIS — E782 Mixed hyperlipidemia: Secondary | ICD-10-CM

## 2013-12-08 DIAGNOSIS — Z79899 Other long term (current) drug therapy: Secondary | ICD-10-CM

## 2013-12-08 NOTE — Telephone Encounter (Signed)
Patient had lipid and liver 11/14

## 2013-12-08 NOTE — Telephone Encounter (Signed)
Blood work orders placed in Epic. Patient notified. 

## 2013-12-08 NOTE — Telephone Encounter (Signed)
Lip liv m7 psa 

## 2013-12-08 NOTE — Telephone Encounter (Signed)
bw orders for Wellness on 01/26/14   Does he need an PSA

## 2014-01-02 ENCOUNTER — Other Ambulatory Visit: Payer: Self-pay | Admitting: Family Medicine

## 2014-01-04 NOTE — Telephone Encounter (Signed)
Ok plus five monthly ref 

## 2014-01-15 ENCOUNTER — Ambulatory Visit (HOSPITAL_COMMUNITY): Payer: Medicare Other | Attending: Cardiovascular Disease | Admitting: Cardiology

## 2014-01-15 DIAGNOSIS — I658 Occlusion and stenosis of other precerebral arteries: Secondary | ICD-10-CM | POA: Insufficient documentation

## 2014-01-15 DIAGNOSIS — I6529 Occlusion and stenosis of unspecified carotid artery: Secondary | ICD-10-CM | POA: Diagnosis not present

## 2014-01-15 NOTE — Progress Notes (Signed)
Carotid duplex complete 

## 2014-01-16 DIAGNOSIS — E782 Mixed hyperlipidemia: Secondary | ICD-10-CM | POA: Diagnosis not present

## 2014-01-16 DIAGNOSIS — Z79899 Other long term (current) drug therapy: Secondary | ICD-10-CM | POA: Diagnosis not present

## 2014-01-16 DIAGNOSIS — Z125 Encounter for screening for malignant neoplasm of prostate: Secondary | ICD-10-CM | POA: Diagnosis not present

## 2014-01-16 LAB — HEPATIC FUNCTION PANEL
ALK PHOS: 79 U/L (ref 39–117)
ALT: 12 U/L (ref 0–53)
AST: 17 U/L (ref 0–37)
Albumin: 3.9 g/dL (ref 3.5–5.2)
BILIRUBIN INDIRECT: 0.5 mg/dL (ref 0.2–1.2)
Bilirubin, Direct: 0.1 mg/dL (ref 0.0–0.3)
TOTAL PROTEIN: 6.7 g/dL (ref 6.0–8.3)
Total Bilirubin: 0.6 mg/dL (ref 0.2–1.2)

## 2014-01-16 LAB — BASIC METABOLIC PANEL
BUN: 15 mg/dL (ref 6–23)
CO2: 29 mEq/L (ref 19–32)
Calcium: 9.3 mg/dL (ref 8.4–10.5)
Chloride: 106 mEq/L (ref 96–112)
Creat: 1.11 mg/dL (ref 0.50–1.35)
Glucose, Bld: 94 mg/dL (ref 70–99)
Potassium: 4.7 mEq/L (ref 3.5–5.3)
SODIUM: 144 meq/L (ref 135–145)

## 2014-01-16 LAB — LIPID PANEL
CHOLESTEROL: 120 mg/dL (ref 0–200)
HDL: 48 mg/dL (ref >39)
LDL Cholesterol: 61 mg/dL (ref 0–99)
TRIGLYCERIDES: 54 mg/dL (ref <150)
Total CHOL/HDL Ratio: 2.5 Ratio
VLDL: 11 mg/dL (ref 0–40)

## 2014-01-17 LAB — PSA, MEDICARE: PSA: 0.86 ng/mL (ref <=4.00)

## 2014-01-23 ENCOUNTER — Telehealth: Payer: Self-pay

## 2014-01-23 NOTE — Telephone Encounter (Signed)
called to give pt carotid dopp results.lmtcb 

## 2014-01-23 NOTE — Telephone Encounter (Signed)
receieved message for Mindy I., that pr was wanting to speak with me directly.returned call.lmom for pt or pt wife to call back if they have add questions.

## 2014-01-23 NOTE — Telephone Encounter (Signed)
Message copied by Lamar Laundry on Fri Jan 23, 2014  8:36 AM ------      Message from: Jerline Pain      Created: Fri Jan 16, 2014  6:15 AM       Stable. No changes. Repeat Doppler in 6 months. ------

## 2014-01-23 NOTE — Telephone Encounter (Signed)
Spoke with pts wife

## 2014-01-23 NOTE — Telephone Encounter (Signed)
Patients wife returned your call. I told her that I would let you know as she would really like to speak to you. I went ahead and told her as documented by Dr Marlou Porch that the doppler was stable with no changes and repeat in 6 months. She was very pleased that I was able to relay that message to her. Thanks, MI

## 2014-01-26 ENCOUNTER — Encounter: Payer: Self-pay | Admitting: Family Medicine

## 2014-01-26 ENCOUNTER — Ambulatory Visit (INDEPENDENT_AMBULATORY_CARE_PROVIDER_SITE_OTHER): Payer: Medicare Other | Admitting: Family Medicine

## 2014-01-26 VITALS — BP 122/82 | Ht 65.0 in | Wt 148.2 lb

## 2014-01-26 DIAGNOSIS — Z0000 Encounter for general adult medical examination without abnormal findings: Secondary | ICD-10-CM | POA: Diagnosis not present

## 2014-01-26 MED ORDER — LISINOPRIL 20 MG PO TABS: 20.0000 mg | ORAL_TABLET | Freq: Two times a day (BID) | ORAL | Status: DC

## 2014-01-26 MED ORDER — ATORVASTATIN CALCIUM 40 MG PO TABS: 40.0000 mg | ORAL_TABLET | Freq: Every day | ORAL | Status: DC

## 2014-01-26 MED ORDER — ALPRAZOLAM 0.5 MG PO TABS: ORAL_TABLET | ORAL | Status: DC

## 2014-01-26 NOTE — Progress Notes (Signed)
Subjective:    Patient ID: [name], male    DOB: 1946/03/18, 68 y.o.   MRN: [id number]  HPI Patient arrives for a medicare wellness exam. Mini cog-pass and no falls in last 6 months per patient. Discuss results of lab work.  Results for orders placed in visit on 12/08/13  LIPID PANEL      Result Value Ref Range   Cholesterol 120  0 - 200 mg/dL   Triglycerides 54  <150 mg/dL   HDL 48  >39 mg/dL   Total CHOL/HDL Ratio 2.5     VLDL 11  0 - 40 mg/dL   LDL Cholesterol 61  0 - 99 mg/dL  HEPATIC FUNCTION PANEL      Result Value Ref Range   Total Bilirubin 0.6  0.2 - 1.2 mg/dL   Bilirubin, Direct 0.1  0.0 - 0.3 mg/dL   Indirect Bilirubin 0.5  0.2 - 1.2 mg/dL   Alkaline Phosphatase 79  39 - 117 U/L   AST 17  0 - 37 U/L   ALT 12  0 - 53 U/L   Total Protein 6.7  6.0 - 8.3 g/dL   Albumin 3.9  3.5 - 5.2 g/dL  BASIC METABOLIC PANEL      Result Value Ref Range   Sodium 144  135 - 145 mEq/L   Potassium 4.7  3.5 - 5.3 mEq/L   Chloride 106  96 - 112 mEq/L   CO2 29  19 - 32 mEq/L   Glucose, Bld 94  70 - 99 mg/dL   BUN 15  6 - 23 mg/dL   Creat 1.11  0.50 - 1.35 mg/dL   Calcium 9.3  8.4 - 10.5 mg/dL  PSA, MEDICARE      Result Value Ref Range   PSA 0.86  <=4.00 ng/mL   Dr Watt Climes did the colonoscopy in 2014  Watching chol intake  Reg exercisre  Review of Systems  Constitutional: Negative for fever, activity change and appetite change.  HENT: Negative for congestion and rhinorrhea.   Eyes: Negative for discharge.  Respiratory: Negative for cough and wheezing.   Cardiovascular: Negative for chest pain.  Gastrointestinal: Negative for vomiting, abdominal pain and blood in stool.  Genitourinary: Negative for frequency and difficulty urinating.  Musculoskeletal: Negative for neck pain.  Skin: Negative for rash.  Allergic/Immunologic: Negative for environmental allergies and food allergies.  Neurological: Negative for weakness and headaches.  Psychiatric/Behavioral: Negative for  agitation.  All other systems reviewed and are negative.      Objective:   Physical Exam  Vitals reviewed. Constitutional: He appears well-developed and well-nourished.  HENT:  Head: Normocephalic and atraumatic.  Right Ear: External ear normal.  Left Ear: External ear normal.  Nose: Nose normal.  Mouth/Throat: Oropharynx is clear and moist.  Eyes: EOM are normal. Pupils are equal, round, and reactive to light.  Neck: Normal range of motion. Neck supple. No thyromegaly present.  Cardiovascular: Normal rate, regular rhythm and normal heart sounds.   No murmur heard. Pulmonary/Chest: Effort normal and breath sounds normal. No respiratory distress. He has no wheezes.  Abdominal: Soft. Bowel sounds are normal. He exhibits no distension and no mass. There is no tenderness.  Genitourinary: Penis normal.  Musculoskeletal: Normal range of motion. He exhibits no edema.  Lymphadenopathy:    He has no cervical adenopathy.  Neurological: He is alert. He exhibits normal muscle tone.  Skin: Skin is warm and dry. No erythema.  Psychiatric: He has a normal mood  and affect. His behavior is normal. Judgment normal.          Assessment & Plan:  Impression 1 wellness exam #2 coronary artery disease #3 hypertension #4 COPD #5 hyperlipidemia plan diet discussed. Exercise discussed. Compliant with medications discussed in encourage WSL

## 2014-03-18 ENCOUNTER — Encounter: Payer: Self-pay | Admitting: Cardiology

## 2014-05-05 ENCOUNTER — Telehealth: Payer: Self-pay | Admitting: Cardiology

## 2014-05-05 DIAGNOSIS — I10 Essential (primary) hypertension: Secondary | ICD-10-CM

## 2014-05-05 DIAGNOSIS — I779 Disorder of arteries and arterioles, unspecified: Secondary | ICD-10-CM

## 2014-05-05 DIAGNOSIS — I739 Peripheral vascular disease, unspecified: Principal | ICD-10-CM

## 2014-05-05 NOTE — Telephone Encounter (Signed)
Spoke with pts wife and noted in Dr Marlou Porch last result note from carotid doppler from May 2015 that pt should have this repeated in 6 months.  Wife states that she will have this scheduled for pt for November, b/c Oct is a busy month for them.  Wife states she will call scheduling herself to have this appt and f/u OV with Dr Marlou Porch set up.  Informed wife that would be fine, and I will place the order in epic.  Wife verbalized understanding and gracious for all the assistance provided.

## 2014-05-05 NOTE — Telephone Encounter (Signed)
New Message  Pt wife called states that the pt was supposed to have 2 carotids per year. She requests a call back to determine if the 2nd carotid is needed this year.. Please call to discuss

## 2014-05-18 ENCOUNTER — Encounter: Payer: Self-pay | Admitting: Cardiology

## 2014-06-29 ENCOUNTER — Encounter (HOSPITAL_COMMUNITY): Payer: Medicare Other

## 2014-07-02 ENCOUNTER — Other Ambulatory Visit: Payer: Self-pay | Admitting: Family Medicine

## 2014-07-02 NOTE — Telephone Encounter (Signed)
May refill lisinopril 3 may refill Xanax 1 needs office visitin December thank you

## 2014-07-02 NOTE — Telephone Encounter (Signed)
Last seen 01/26/14

## 2014-07-03 ENCOUNTER — Ambulatory Visit: Payer: Medicare Other | Admitting: Cardiology

## 2014-07-09 ENCOUNTER — Ambulatory Visit: Payer: Medicare Other | Admitting: Cardiology

## 2014-07-14 DIAGNOSIS — Z23 Encounter for immunization: Secondary | ICD-10-CM | POA: Diagnosis not present

## 2014-07-24 ENCOUNTER — Encounter: Payer: Self-pay | Admitting: Cardiology

## 2014-07-24 ENCOUNTER — Encounter (HOSPITAL_COMMUNITY): Payer: Medicare Other

## 2014-07-24 ENCOUNTER — Ambulatory Visit (HOSPITAL_COMMUNITY): Payer: Medicare Other | Attending: Cardiology | Admitting: *Deleted

## 2014-07-24 ENCOUNTER — Ambulatory Visit (INDEPENDENT_AMBULATORY_CARE_PROVIDER_SITE_OTHER): Payer: Medicare Other | Admitting: Cardiology

## 2014-07-24 VITALS — BP 120/72 | HR 62 | Ht 65.0 in | Wt 148.0 lb

## 2014-07-24 DIAGNOSIS — I10 Essential (primary) hypertension: Secondary | ICD-10-CM | POA: Diagnosis not present

## 2014-07-24 DIAGNOSIS — E785 Hyperlipidemia, unspecified: Secondary | ICD-10-CM

## 2014-07-24 DIAGNOSIS — I25810 Atherosclerosis of coronary artery bypass graft(s) without angina pectoris: Secondary | ICD-10-CM

## 2014-07-24 DIAGNOSIS — I6523 Occlusion and stenosis of bilateral carotid arteries: Secondary | ICD-10-CM | POA: Diagnosis not present

## 2014-07-24 DIAGNOSIS — Z720 Tobacco use: Secondary | ICD-10-CM | POA: Insufficient documentation

## 2014-07-24 DIAGNOSIS — R55 Syncope and collapse: Secondary | ICD-10-CM | POA: Diagnosis not present

## 2014-07-24 DIAGNOSIS — I6529 Occlusion and stenosis of unspecified carotid artery: Secondary | ICD-10-CM

## 2014-07-24 DIAGNOSIS — I779 Disorder of arteries and arterioles, unspecified: Secondary | ICD-10-CM

## 2014-07-24 DIAGNOSIS — I739 Peripheral vascular disease, unspecified: Secondary | ICD-10-CM

## 2014-07-24 NOTE — Patient Instructions (Signed)
The current medical regimen is effective;  continue present plan and medications.  Follow up in 1 year with Dr Skains.  You will receive a letter in the mail 2 months before you are due.  Please call us when you receive this letter to schedule your follow up appointment.  
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
## 2014-07-24 NOTE — Progress Notes (Signed)
Carotid duplex completed 

## 2014-07-29 ENCOUNTER — Ambulatory Visit: Payer: Medicare Other | Admitting: Family Medicine

## 2014-07-30 ENCOUNTER — Ambulatory Visit (INDEPENDENT_AMBULATORY_CARE_PROVIDER_SITE_OTHER): Payer: Medicare Other | Admitting: Family Medicine

## 2014-07-30 ENCOUNTER — Encounter: Payer: Self-pay | Admitting: Family Medicine

## 2014-07-30 VITALS — BP 158/88 | Ht 65.0 in | Wt 150.0 lb

## 2014-07-30 DIAGNOSIS — E785 Hyperlipidemia, unspecified: Secondary | ICD-10-CM | POA: Diagnosis not present

## 2014-07-30 DIAGNOSIS — M79642 Pain in left hand: Secondary | ICD-10-CM

## 2014-07-30 DIAGNOSIS — I6529 Occlusion and stenosis of unspecified carotid artery: Secondary | ICD-10-CM | POA: Diagnosis not present

## 2014-07-30 DIAGNOSIS — Z23 Encounter for immunization: Secondary | ICD-10-CM

## 2014-07-30 DIAGNOSIS — J438 Other emphysema: Secondary | ICD-10-CM | POA: Diagnosis not present

## 2014-07-30 DIAGNOSIS — Z79899 Other long term (current) drug therapy: Secondary | ICD-10-CM | POA: Diagnosis not present

## 2014-07-30 DIAGNOSIS — I10 Essential (primary) hypertension: Secondary | ICD-10-CM

## 2014-07-30 MED ORDER — AMOXICILLIN 500 MG PO TABS: 500.0000 mg | ORAL_TABLET | Freq: Three times a day (TID) | ORAL | Status: DC

## 2014-07-30 MED ORDER — HYDROCODONE-HOMATROPINE 5-1.5 MG/5ML PO SYRP: 5.0000 mL | ORAL_SOLUTION | Freq: Every evening | ORAL | Status: DC | PRN

## 2014-07-30 NOTE — Progress Notes (Signed)
   Subjective:    Patient ID: [name], male    DOB: 1945/11/24, 68 y.o.   MRN: [id number]  HPI Patient is here today for a check up.  Went to cardiologist. Has 79% blockage on right side and 50% on left side.   C/o cough that started Friday. It will wake him up from his sleep. Little bit of fever. Prod cough white in nature   cking carotids q six mo  bp elevated, compliant with nmeds  Lipid meds compliant. Sticking with diet. No obvious side effects.  Painful left hand. Injured it several months ago. Still experiencing swelling at the base of the thumb and the soft tissue between forefinger and thumb    Review of Systems No headache no chest pain no back pain no abdominal pain no change in bowel habits    Objective:   Physical Exam  Alert no acute distress. Vitals stable. Blood pressure good. Lungs clear. Heart rare rhythm. H&T mom his congestion intermittent bronchial cough during exam. Left hand some swelling has noted      Assessment & Plan:  Impression 1 hypertension good control #2 hyperlipidemia status uncertain #3 sinusitis/bronchitis discussed #4 status post hand injury plan x-ray hand. Antibiotics prescribed. Check appropriate blood work. Vaccines discuss and Prevnar administered. Recheck in 6 months. WSL

## 2014-08-03 DIAGNOSIS — E785 Hyperlipidemia, unspecified: Secondary | ICD-10-CM | POA: Diagnosis not present

## 2014-08-03 DIAGNOSIS — Z79899 Other long term (current) drug therapy: Secondary | ICD-10-CM | POA: Diagnosis not present

## 2014-08-04 ENCOUNTER — Encounter: Payer: Self-pay | Admitting: Family Medicine

## 2014-08-04 LAB — HEPATIC FUNCTION PANEL
ALBUMIN: 3.8 g/dL (ref 3.5–5.2)
ALK PHOS: 88 U/L (ref 39–117)
ALT: 24 U/L (ref 0–53)
AST: 26 U/L (ref 0–37)
Bilirubin, Direct: 0.1 mg/dL (ref 0.0–0.3)
Indirect Bilirubin: 0.3 mg/dL (ref 0.2–1.2)
TOTAL PROTEIN: 6.6 g/dL (ref 6.0–8.3)
Total Bilirubin: 0.4 mg/dL (ref 0.2–1.2)

## 2014-08-04 LAB — LIPID PANEL
CHOL/HDL RATIO: 2.6 ratio
Cholesterol: 120 mg/dL (ref 0–200)
HDL: 47 mg/dL (ref >39)
LDL CALC: 63 mg/dL (ref 0–99)
TRIGLYCERIDES: 51 mg/dL (ref <150)
VLDL: 10 mg/dL (ref 0–40)

## 2014-08-05 ENCOUNTER — Other Ambulatory Visit: Payer: Self-pay | Admitting: Family Medicine

## 2014-09-02 ENCOUNTER — Other Ambulatory Visit: Payer: Self-pay | Admitting: Family Medicine

## 2014-09-02 NOTE — Telephone Encounter (Signed)
Ok 6 mo worth 

## 2014-09-02 NOTE — Telephone Encounter (Signed)
Last seen 07/30/14

## 2014-09-03 ENCOUNTER — Other Ambulatory Visit: Payer: Self-pay | Admitting: Family Medicine

## 2014-09-04 NOTE — Telephone Encounter (Signed)
May fill this and 4 refills

## 2014-09-23 DIAGNOSIS — M79641 Pain in right hand: Secondary | ICD-10-CM | POA: Diagnosis not present

## 2014-09-24 ENCOUNTER — Telehealth: Payer: Self-pay | Admitting: *Deleted

## 2014-09-24 DIAGNOSIS — M79641 Pain in right hand: Secondary | ICD-10-CM | POA: Diagnosis not present

## 2014-09-24 MED ORDER — NITROGLYCERIN 0.4 MG SL SUBL: 0.4000 mg | SUBLINGUAL_TABLET | SUBLINGUAL | Status: DC | PRN

## 2014-09-24 NOTE — Telephone Encounter (Signed)
OK to refill

## 2014-09-24 NOTE — Telephone Encounter (Signed)
Ok to give patient an rx for nitro? Not on patients med list. Please advise. Thanks, MI

## 2014-09-24 NOTE — Telephone Encounter (Signed)
Follow up       Please call in presc for nitro to [facility]

## 2014-10-14 DIAGNOSIS — M79641 Pain in right hand: Secondary | ICD-10-CM | POA: Diagnosis not present

## 2014-11-26 ENCOUNTER — Telehealth: Payer: Self-pay | Admitting: Cardiology

## 2014-11-26 NOTE — Telephone Encounter (Signed)
Due back for repeat in 01/2015 - pt will be contacted by [facility] department.

## 2014-11-26 NOTE — Telephone Encounter (Signed)
New message     Patient wife calling wanted to know when his her husband next carotid doppler scheduled.    No order in the system.

## 2014-11-27 NOTE — Telephone Encounter (Signed)
Started to leave a message on voice mail as requested and wife picked up.  She is aware f/u carotid is due in June and pt will be contacted by that department to schedule.  She states understanding.

## 2014-11-30 ENCOUNTER — Telehealth: Payer: Self-pay | Admitting: Cardiology

## 2014-11-30 DIAGNOSIS — I739 Peripheral vascular disease, unspecified: Principal | ICD-10-CM

## 2014-11-30 DIAGNOSIS — I779 Disorder of arteries and arterioles, unspecified: Secondary | ICD-10-CM

## 2014-11-30 NOTE — Telephone Encounter (Signed)
New Message  Pt wife calling to speak w/ Rn about carotid doppler. Pt wants to do the test in May. No orders are present. Please call back and discuss.

## 2014-11-30 NOTE — Telephone Encounter (Signed)
Pt is needing to schedule his carotid doppler which was scheduled today for 5/10 at 3:30 pm.  They will call back if further issues or concerns.

## 2014-12-09 DIAGNOSIS — M79641 Pain in right hand: Secondary | ICD-10-CM | POA: Diagnosis not present

## 2014-12-21 ENCOUNTER — Telehealth: Payer: Self-pay | Admitting: Family Medicine

## 2014-12-21 DIAGNOSIS — Z125 Encounter for screening for malignant neoplasm of prostate: Secondary | ICD-10-CM

## 2014-12-21 DIAGNOSIS — Z79899 Other long term (current) drug therapy: Secondary | ICD-10-CM

## 2014-12-21 DIAGNOSIS — E785 Hyperlipidemia, unspecified: Secondary | ICD-10-CM

## 2014-12-21 NOTE — Telephone Encounter (Signed)
Pt is needing lab orders sent over for his upcoming appt. Last labs were lipid,hepatic,bmp,and psa on 01/16/14

## 2014-12-22 NOTE — Telephone Encounter (Signed)
Rep all same 

## 2014-12-22 NOTE — Telephone Encounter (Signed)
bw orders ready. Pt notified. 

## 2015-01-05 ENCOUNTER — Ambulatory Visit (HOSPITAL_COMMUNITY): Payer: Medicare Other | Attending: Cardiology

## 2015-01-05 ENCOUNTER — Encounter (HOSPITAL_COMMUNITY): Payer: Medicare Other

## 2015-01-05 DIAGNOSIS — I739 Peripheral vascular disease, unspecified: Secondary | ICD-10-CM

## 2015-01-05 DIAGNOSIS — I779 Disorder of arteries and arterioles, unspecified: Secondary | ICD-10-CM

## 2015-01-05 DIAGNOSIS — I6523 Occlusion and stenosis of bilateral carotid arteries: Secondary | ICD-10-CM | POA: Insufficient documentation

## 2015-01-08 DIAGNOSIS — Z125 Encounter for screening for malignant neoplasm of prostate: Secondary | ICD-10-CM | POA: Diagnosis not present

## 2015-01-08 DIAGNOSIS — E785 Hyperlipidemia, unspecified: Secondary | ICD-10-CM | POA: Diagnosis not present

## 2015-01-08 DIAGNOSIS — Z79899 Other long term (current) drug therapy: Secondary | ICD-10-CM | POA: Diagnosis not present

## 2015-01-09 LAB — LIPID PANEL
Chol/HDL Ratio: 2.1 ratio units (ref 0.0–5.0)
Cholesterol, Total: 125 mg/dL (ref 100–199)
HDL: 60 mg/dL (ref >39)
LDL Calculated: 57 mg/dL (ref 0–99)
TRIGLYCERIDES: 41 mg/dL (ref 0–149)
VLDL Cholesterol Cal: 8 mg/dL (ref 5–40)

## 2015-01-09 LAB — HEPATIC FUNCTION PANEL
ALT: 16 IU/L (ref 0–44)
AST: 26 IU/L (ref 0–40)
Albumin: 4 g/dL (ref 3.5–4.8)
Alkaline Phosphatase: 75 IU/L (ref 39–117)
BILIRUBIN, DIRECT: 0.19 mg/dL (ref 0.00–0.40)
Bilirubin Total: 0.6 mg/dL (ref 0.0–1.2)
TOTAL PROTEIN: 6.4 g/dL (ref 6.0–8.5)

## 2015-01-09 LAB — BASIC METABOLIC PANEL
BUN/Creatinine Ratio: 19 (ref 10–22)
BUN: 19 mg/dL (ref 8–27)
CALCIUM: 8.9 mg/dL (ref 8.6–10.2)
CO2: 23 mmol/L (ref 18–29)
Chloride: 104 mmol/L (ref 97–108)
Creatinine, Ser: 1.02 mg/dL (ref 0.76–1.27)
GFR calc Af Amer: 85 mL/min/{1.73_m2} (ref >59)
GFR calc non Af Amer: 74 mL/min/{1.73_m2} (ref >59)
GLUCOSE: 91 mg/dL (ref 65–99)
Potassium: 3.9 mmol/L (ref 3.5–5.2)
Sodium: 144 mmol/L (ref 134–144)

## 2015-01-09 LAB — PSA: Prostate Specific Ag, Serum: 0.8 ng/mL (ref 0.0–4.0)

## 2015-01-29 ENCOUNTER — Encounter: Payer: Medicare Other | Admitting: Family Medicine

## 2015-02-01 ENCOUNTER — Ambulatory Visit (INDEPENDENT_AMBULATORY_CARE_PROVIDER_SITE_OTHER): Payer: Medicare Other | Admitting: Family Medicine

## 2015-02-01 ENCOUNTER — Other Ambulatory Visit: Payer: Self-pay | Admitting: Family Medicine

## 2015-02-01 ENCOUNTER — Encounter: Payer: Self-pay | Admitting: Family Medicine

## 2015-02-01 VITALS — BP 120/74 | Ht 65.0 in | Wt 154.8 lb

## 2015-02-01 DIAGNOSIS — E785 Hyperlipidemia, unspecified: Secondary | ICD-10-CM

## 2015-02-01 DIAGNOSIS — Z0000 Encounter for general adult medical examination without abnormal findings: Secondary | ICD-10-CM

## 2015-02-01 DIAGNOSIS — I10 Essential (primary) hypertension: Secondary | ICD-10-CM

## 2015-02-01 MED ORDER — LISINOPRIL 20 MG PO TABS: 20.0000 mg | ORAL_TABLET | Freq: Two times a day (BID) | ORAL | Status: DC

## 2015-02-01 MED ORDER — ATORVASTATIN CALCIUM 40 MG PO TABS: 40.0000 mg | ORAL_TABLET | Freq: Every day | ORAL | Status: DC

## 2015-02-01 NOTE — Patient Instructions (Signed)
Results for orders placed or performed in visit on 12/21/14  Lipid panel  Result Value Ref Range   Cholesterol, Total 125 100 - 199 mg/dL   Triglycerides 41 0 - 149 mg/dL   HDL 60 >39 mg/dL   VLDL Cholesterol Cal 8 5 - 40 mg/dL   LDL Calculated 57 0 - 99 mg/dL   Chol/HDL Ratio 2.1 0.0 - 5.0 ratio units  Hepatic function panel  Result Value Ref Range   Total Protein 6.4 6.0 - 8.5 g/dL   Albumin 4.0 3.5 - 4.8 g/dL   Bilirubin Total 0.6 0.0 - 1.2 mg/dL   Bilirubin, Direct 0.19 0.00 - 0.40 mg/dL   Alkaline Phosphatase 75 39 - 117 IU/L   AST 26 0 - 40 IU/L   ALT 16 0 - 44 IU/L  Basic metabolic panel  Result Value Ref Range   Glucose 91 65 - 99 mg/dL   BUN 19 8 - 27 mg/dL   Creatinine, Ser 1.02 0.76 - 1.27 mg/dL   GFR calc non Af Amer 74 >59 mL/min/1.73   GFR calc Af Amer 85 >59 mL/min/1.73   BUN/Creatinine Ratio 19 10 - 22   Sodium 144 134 - 144 mmol/L   Potassium 3.9 3.5 - 5.2 mmol/L   Chloride 104 97 - 108 mmol/L   CO2 23 18 - 29 mmol/L   Calcium 8.9 8.6 - 10.2 mg/dL  PSA  Result Value Ref Range   Prostate Specific Ag, Serum 0.8 0.0 - 4.0 ng/mL

## 2015-02-01 NOTE — Telephone Encounter (Signed)
Ok six mo worth seeing soon

## 2015-02-01 NOTE — Progress Notes (Signed)
Subjective:    Patient ID: [name], male    DOB: [birth year], 69 y.o.   MRN: [id number]  HPI AWV- Annual Wellness Visit  The patient was seen for their annual wellness visit. The patient's past medical history, surgical history, and family history were reviewed. Pertinent vaccines were reviewed ( tetanus, pneumonia, shingles, flu) The patient's medication list was reviewed and updated.  The height and weight were entered. The patient's current BMI is: 25/76  Cognitive screening was completed. Outcome of Mini - Cog: pass  Falls within the past 6 months:none  Current tobacco usage: no (All patients who use tobacco were given written and verbal information on quitting)  Recent listing of emergency department/hospitalizations over the past year were reviewed.  current specialist the patient sees on a regular basis: cardiologist    Medicare annual wellness visit patient questionnaire was reviewed.  A written screening schedule for the patient for the next 5-10 years was given. Appropriate discussion of followup regarding next visit was discussed. .this  Results for orders placed or performed in visit on 12/21/14  Lipid panel  Result Value Ref Range   Cholesterol, Total 125 100 - 199 mg/dL   Triglycerides 41 0 - 149 mg/dL   HDL 60 >39 mg/dL   VLDL Cholesterol Cal 8 5 - 40 mg/dL   LDL Calculated 57 0 - 99 mg/dL   Chol/HDL Ratio 2.1 0.0 - 5.0 ratio units  Hepatic function panel  Result Value Ref Range   Total Protein 6.4 6.0 - 8.5 g/dL   Albumin 4.0 3.5 - 4.8 g/dL   Bilirubin Total 0.6 0.0 - 1.2 mg/dL   Bilirubin, Direct 0.19 0.00 - 0.40 mg/dL   Alkaline Phosphatase 75 39 - 117 IU/L   AST 26 0 - 40 IU/L   ALT 16 0 - 44 IU/L  Basic metabolic panel  Result Value Ref Range   Glucose 91 65 - 99 mg/dL   BUN 19 8 - 27 mg/dL   Creatinine, Ser 1.02 0.76 - 1.27 mg/dL   GFR calc non Af Amer 74 >59 mL/min/1.73   GFR calc Af Amer 85 >59 mL/min/1.73   BUN/Creatinine Ratio 19  10 - 22   Sodium 144 134 - 144 mmol/L   Potassium 3.9 3.5 - 5.2 mmol/L   Chloride 104 97 - 108 mmol/L   CO2 23 18 - 29 mmol/L   Calcium 8.9 8.6 - 10.2 mg/dL  PSA  Result Value Ref Range   Prostate Specific Ag, Serum 0.8 0.0 - 4.0 ng/mL   Compliant with lipid medication. No obvious side effects. Trying to cut down his spent intake.  Compliant blood pressure medicine. Does not miss a dose. Working on salt intake.  Patient had recent lab work   Review of Systems  Constitutional: Negative for fever, activity change and appetite change.  HENT: Negative for congestion and rhinorrhea.   Eyes: Negative for discharge.  Respiratory: Negative for cough and wheezing.   Cardiovascular: Negative for chest pain.  Gastrointestinal: Negative for vomiting, abdominal pain and blood in stool.  Genitourinary: Negative for frequency and difficulty urinating.  Musculoskeletal: Negative for neck pain.  Skin: Negative for rash.  Allergic/Immunologic: Negative for environmental allergies and food allergies.  Neurological: Negative for weakness and headaches.  Psychiatric/Behavioral: Negative for agitation.  All other systems reviewed and are negative.      Objective:   Physical Exam  Constitutional: He appears well-developed and well-nourished.  HENT:  Head: Normocephalic and atraumatic.  Right Ear:  External ear normal.  Left Ear: External ear normal.  Nose: Nose normal.  Mouth/Throat: Oropharynx is clear and moist.  Eyes: EOM are normal. Pupils are equal, round, and reactive to light.  Neck: Normal range of motion. Neck supple. No thyromegaly present.  Cardiovascular: Normal rate, regular rhythm and normal heart sounds.   No murmur heard. Pulmonary/Chest: Effort normal and breath sounds normal. No respiratory distress. He has no wheezes.  Abdominal: Soft. Bowel sounds are normal. He exhibits no distension and no mass. There is no tenderness.  Genitourinary: Penis normal.  Musculoskeletal:  Normal range of motion. He exhibits no edema.  Lymphadenopathy:    He has no cervical adenopathy.  Neurological: He is alert. He exhibits normal muscle tone.  Skin: Skin is warm and dry. No erythema.  Psychiatric: He has a normal mood and affect. His behavior is normal. Judgment normal.  Vitals reviewed.         Assessment & Plan:  Impression #1 wellness exam colonoscopy not due yet #2 coronary artery disease. #3 hypertension controlled good. #4 hyperlipidemia at a good goal considering the patient's coronary artery disease plan diet discussed exercise discussed. Medications refilled. Recheck in 6 months. WSL

## 2015-02-01 NOTE — Telephone Encounter (Signed)
This is Dr. Sandrea Hughs patient

## 2015-03-24 ENCOUNTER — Telehealth: Payer: Self-pay | Admitting: Cardiology

## 2015-03-24 NOTE — Telephone Encounter (Signed)
Printed copy of May Carotid Duplex results mailed to patient

## 2015-03-24 NOTE — Telephone Encounter (Signed)
New Message   Pt wife is calling to have rn call her back to talk bout her husbands Cardio Duplex Test

## 2015-06-09 ENCOUNTER — Telehealth: Payer: Self-pay | Admitting: Cardiology

## 2015-06-09 NOTE — Telephone Encounter (Signed)
New Message  Pt wife calling to see if pt needs a Carotid doppler before yearly appt w/ Skains on 12/1. If so, an order needs to be placed. Please call back and discuss.

## 2015-06-09 NOTE — Telephone Encounter (Signed)
Last carotid doppler 01/05/15, needs 6 mo follow-up per Dr. Marlou Porch

## 2015-06-10 NOTE — Telephone Encounter (Signed)
LMTCB

## 2015-06-24 ENCOUNTER — Ambulatory Visit (INDEPENDENT_AMBULATORY_CARE_PROVIDER_SITE_OTHER): Payer: Medicare Other | Admitting: Family Medicine

## 2015-06-24 VITALS — BP 144/84 | Temp 98.1°F | Ht 65.0 in | Wt 149.1 lb

## 2015-06-24 DIAGNOSIS — B9689 Other specified bacterial agents as the cause of diseases classified elsewhere: Secondary | ICD-10-CM

## 2015-06-24 DIAGNOSIS — J019 Acute sinusitis, unspecified: Secondary | ICD-10-CM | POA: Diagnosis not present

## 2015-06-24 MED ORDER — AMOXICILLIN-POT CLAVULANATE 875-125 MG PO TABS: 1.0000 | ORAL_TABLET | Freq: Two times a day (BID) | ORAL | Status: DC

## 2015-06-24 NOTE — Progress Notes (Signed)
   Subjective:    Patient ID: [name], male    DOB: 26-Oct-1945, 69 y.o.   MRN: [id number]  Cough This is a new problem. The current episode started in the past 7 days. The problem has been gradually worsening. The problem occurs every few minutes. The cough is productive of sputum. Associated symptoms include nasal congestion and rhinorrhea. Pertinent negatives include no chest pain, ear pain, fever or wheezing. Nothing aggravates the symptoms. He has tried OTC cough suppressant (Mucinex DM) for the symptoms. The treatment provided mild relief.   Patient states no other concerns this visit. Patient with moderate head congestion drainage coughing not feeling good sinus pressure symptoms over the past week  Review of Systems  Constitutional: Negative for fever and activity change.  HENT: Positive for congestion and rhinorrhea. Negative for ear pain.   Eyes: Negative for discharge.  Respiratory: Positive for cough. Negative for wheezing.   Cardiovascular: Negative for chest pain.       Objective:   Physical Exam  Constitutional: He appears well-developed.  HENT:  Head: Normocephalic.  Mouth/Throat: Oropharynx is clear and moist. No oropharyngeal exudate.  Neck: Normal range of motion.  Cardiovascular: Normal rate, regular rhythm and normal heart sounds.   No murmur heard. Pulmonary/Chest: Effort normal and breath sounds normal. He has no wheezes.  Lymphadenopathy:    He has no cervical adenopathy.  Neurological: He exhibits normal muscle tone.  Skin: Skin is warm and dry.  Nursing note and vitals reviewed.         Assessment & Plan:  Acute rhinosinusitis Augmentin recommended twice a day for the next 10 days warning signs discussed follow-up if problems patient states he will get his flu shot couple weeks at the pharmacy  I recommend pneumococcal 23 when the patient follows up after December 3

## 2015-07-19 ENCOUNTER — Other Ambulatory Visit: Payer: Self-pay | Admitting: Cardiology

## 2015-07-19 DIAGNOSIS — I6523 Occlusion and stenosis of bilateral carotid arteries: Secondary | ICD-10-CM

## 2015-07-27 ENCOUNTER — Ambulatory Visit (HOSPITAL_COMMUNITY)
Admission: RE | Admit: 2015-07-27 | Discharge: 2015-07-27 | Disposition: A | Discharge status: Home / Self Care | Payer: Medicare Other | Source: Ambulatory Visit | Attending: Cardiovascular Disease | Admitting: Cardiovascular Disease

## 2015-07-27 DIAGNOSIS — I6523 Occlusion and stenosis of bilateral carotid arteries: Secondary | ICD-10-CM | POA: Insufficient documentation

## 2015-07-27 DIAGNOSIS — I10 Essential (primary) hypertension: Secondary | ICD-10-CM | POA: Insufficient documentation

## 2015-07-28 ENCOUNTER — Other Ambulatory Visit: Payer: Self-pay | Admitting: Family Medicine

## 2015-07-29 ENCOUNTER — Encounter: Payer: Self-pay | Admitting: *Deleted

## 2015-07-29 ENCOUNTER — Ambulatory Visit (INDEPENDENT_AMBULATORY_CARE_PROVIDER_SITE_OTHER): Payer: Medicare Other | Admitting: Cardiology

## 2015-07-29 ENCOUNTER — Telehealth: Payer: Self-pay | Admitting: Cardiology

## 2015-07-29 ENCOUNTER — Encounter: Payer: Self-pay | Admitting: Cardiology

## 2015-07-29 VITALS — BP 122/82 | HR 141 | Ht 65.0 in | Wt 150.8 lb

## 2015-07-29 DIAGNOSIS — I739 Peripheral vascular disease, unspecified: Secondary | ICD-10-CM

## 2015-07-29 DIAGNOSIS — I10 Essential (primary) hypertension: Secondary | ICD-10-CM | POA: Diagnosis not present

## 2015-07-29 DIAGNOSIS — I25810 Atherosclerosis of coronary artery bypass graft(s) without angina pectoris: Secondary | ICD-10-CM

## 2015-07-29 DIAGNOSIS — E785 Hyperlipidemia, unspecified: Secondary | ICD-10-CM

## 2015-07-29 DIAGNOSIS — I4892 Unspecified atrial flutter: Secondary | ICD-10-CM

## 2015-07-29 DIAGNOSIS — I779 Disorder of arteries and arterioles, unspecified: Secondary | ICD-10-CM | POA: Diagnosis not present

## 2015-07-29 DIAGNOSIS — I6523 Occlusion and stenosis of bilateral carotid arteries: Secondary | ICD-10-CM

## 2015-07-29 LAB — CBC
HCT: 41.5 % (ref 39.0–52.0)
HEMOGLOBIN: 13.7 g/dL (ref 13.0–17.0)
MCH: 30.9 pg (ref 26.0–34.0)
MCHC: 33 g/dL (ref 30.0–36.0)
MCV: 93.5 fL (ref 78.0–100.0)
MPV: 9.7 fL (ref 8.6–12.4)
Platelets: 226 10*3/uL (ref 150–400)
RBC: 4.44 MIL/uL (ref 4.22–5.81)
RDW: 13.4 % (ref 11.5–15.5)
WBC: 8.5 10*3/uL (ref 4.0–10.5)

## 2015-07-29 LAB — BASIC METABOLIC PANEL
BUN: 18 mg/dL (ref 7–25)
CALCIUM: 9.1 mg/dL (ref 8.6–10.3)
CO2: 27 mmol/L (ref 20–31)
Chloride: 105 mmol/L (ref 98–110)
Creat: 1.23 mg/dL — ABNORMAL HIGH (ref 0.70–1.18)
GLUCOSE: 79 mg/dL (ref 65–99)
Potassium: 4.2 mmol/L (ref 3.5–5.3)
Sodium: 141 mmol/L (ref 135–146)

## 2015-07-29 MED ORDER — METOPROLOL TARTRATE 25 MG PO TABS
25.0000 mg | ORAL_TABLET | Freq: Two times a day (BID) | ORAL | Status: DC
Start: 2015-07-29 — End: 2015-08-02

## 2015-07-29 MED ORDER — RIVAROXABAN 20 MG PO TABS: 20.0000 mg | ORAL_TABLET | Freq: Every day | ORAL | Status: DC

## 2015-07-29 NOTE — Patient Instructions (Signed)
Medication Instructions:  Please stop your ASA.  Start Metoprolol 25 mg one tablet twice a day.  Start Xarelto 20 mg once daily with your evening meal. Continue all other medications as listed.  Labwork: Please have blood work today (CBC, BMP)  Testing/Procedures: Your physician has requested that you have a TEE/Cardioversion. During a TEE, sound waves are used to create images of your heart. It provides your doctor with information about the size and shape of your heart and how well your heart's chambers and valves are working. In this test, a transducer is attached to the end of a flexible tube that is guided down you throat and into your esophagus (the tube leading from your mouth to your stomach) to get a more detailed image of your heart. Once the TEE has determined that a blood clot is not present, the cardioversion begins. Electrical Cardioversion uses a jolt of electricity to your heart either through paddles or wired patches attached to your chest. This is a controlled, usually prescheduled, procedure. This procedure is done at the hospital and you are not awake during the procedure. You usually go home the day of the procedure. Please see the instruction sheet given to you today for more information.  Follow-Up: Follow up approximately 2 weeks after your cardioversion.  If you need a refill on your cardiac medications before your next appointment, please call your pharmacy.  Thank you for choosing [facility]!!

## 2015-07-29 NOTE — Telephone Encounter (Signed)
New Message    Pt wife wants test results mailed home from todays visit

## 2015-07-29 NOTE — Telephone Encounter (Signed)
Will print and give results to pt while he is here today for his 2:15 appt
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
## 2015-07-30 ENCOUNTER — Telehealth: Payer: Self-pay | Admitting: Cardiology

## 2015-07-30 NOTE — Telephone Encounter (Signed)
New Message  Pt was seen yesterday- went to pick up xarelto Rx afterwards and stated that it was very expensive. Pt wife calling to speak w/ RN on a cheaper alternative. Please call back and discuss.

## 2015-07-30 NOTE — Addendum Note (Signed)
Addended by: Jerline Pain on: 07/30/2015 04:33 PM   Modules accepted: Orders

## 2015-07-30 NOTE — Telephone Encounter (Signed)
Spoke with wife RE: RX for Xarelto.  She reports when she went to pick it up the cost was $408 and pt can not afford that.  Pt is requesting he be changed to something less expensive as he doesn't have any medication insurance coverage.  Advised the only thing he can be changed to that would cost less would be coumadin.  Pt does not want to take coumadin.  Pt was given samples at his appt yesterday.  He was advised to continue to take the samples as instructed, have his TEE/cardioversion as scheduled and changes to medication can be discussed at his follow up appt.  Wife aware we will look into pt assistance programs for him since he doesn't have medication insurance coverage.  She states understanding of above.

## 2015-08-02 ENCOUNTER — Ambulatory Visit (HOSPITAL_COMMUNITY): Payer: Medicare Other | Admitting: Certified Registered Nurse Anesthetist

## 2015-08-02 ENCOUNTER — Ambulatory Visit (HOSPITAL_BASED_OUTPATIENT_CLINIC_OR_DEPARTMENT_OTHER)
Admission: RE | Admit: 2015-08-02 | Discharge: 2015-08-02 | Disposition: A | Discharge status: Home / Self Care | Payer: Medicare Other | Source: Ambulatory Visit | Attending: Cardiology | Admitting: Cardiology

## 2015-08-02 ENCOUNTER — Ambulatory Visit (HOSPITAL_COMMUNITY)
Admission: RE | Admit: 2015-08-02 | Discharge: 2015-08-02 | Disposition: A | Discharge status: Home / Self Care | Payer: Medicare Other | Source: Ambulatory Visit | Attending: Cardiology | Admitting: Cardiology

## 2015-08-02 ENCOUNTER — Telehealth: Payer: Self-pay | Admitting: Cardiology

## 2015-08-02 ENCOUNTER — Encounter (HOSPITAL_COMMUNITY)
Admission: RE | Disposition: A | Discharge status: Home / Self Care | Payer: Self-pay | Source: Ambulatory Visit | Attending: Cardiology

## 2015-08-02 ENCOUNTER — Encounter (HOSPITAL_COMMUNITY): Payer: Self-pay | Admitting: Certified Registered Nurse Anesthetist

## 2015-08-02 ENCOUNTER — Other Ambulatory Visit: Payer: Self-pay | Admitting: Family Medicine

## 2015-08-02 DIAGNOSIS — I739 Peripheral vascular disease, unspecified: Secondary | ICD-10-CM | POA: Insufficient documentation

## 2015-08-02 DIAGNOSIS — Z79899 Other long term (current) drug therapy: Secondary | ICD-10-CM | POA: Diagnosis not present

## 2015-08-02 DIAGNOSIS — E785 Hyperlipidemia, unspecified: Secondary | ICD-10-CM | POA: Insufficient documentation

## 2015-08-02 DIAGNOSIS — M180 Bilateral primary osteoarthritis of first carpometacarpal joints: Secondary | ICD-10-CM | POA: Insufficient documentation

## 2015-08-02 DIAGNOSIS — Z7982 Long term (current) use of aspirin: Secondary | ICD-10-CM | POA: Insufficient documentation

## 2015-08-02 DIAGNOSIS — I10 Essential (primary) hypertension: Secondary | ICD-10-CM | POA: Diagnosis not present

## 2015-08-02 DIAGNOSIS — I4892 Unspecified atrial flutter: Secondary | ICD-10-CM | POA: Diagnosis not present

## 2015-08-02 DIAGNOSIS — I340 Nonrheumatic mitral (valve) insufficiency: Secondary | ICD-10-CM | POA: Diagnosis not present

## 2015-08-02 DIAGNOSIS — I351 Nonrheumatic aortic (valve) insufficiency: Secondary | ICD-10-CM | POA: Diagnosis not present

## 2015-08-02 DIAGNOSIS — Z87891 Personal history of nicotine dependence: Secondary | ICD-10-CM | POA: Diagnosis not present

## 2015-08-02 DIAGNOSIS — I2510 Atherosclerotic heart disease of native coronary artery without angina pectoris: Secondary | ICD-10-CM | POA: Diagnosis not present

## 2015-08-02 DIAGNOSIS — J449 Chronic obstructive pulmonary disease, unspecified: Secondary | ICD-10-CM | POA: Insufficient documentation

## 2015-08-02 HISTORY — PX: TEE WITHOUT CARDIOVERSION: SHX5443

## 2015-08-02 SURGERY — ECHOCARDIOGRAM, TRANSESOPHAGEAL
Anesthesia: Monitor Anesthesia Care

## 2015-08-02 MED ORDER — METOPROLOL TARTRATE 25 MG PO TABS: 50.0000 mg | ORAL_TABLET | Freq: Two times a day (BID) | ORAL | Status: DC

## 2015-08-02 MED ORDER — PROPOFOL 500 MG/50ML IV EMUL
INTRAVENOUS | Status: DC | PRN
  Administered 2015-08-02: 150 ug/kg/min via INTRAVENOUS

## 2015-08-02 MED ORDER — BUTAMBEN-TETRACAINE-BENZOCAINE 2-2-14 % EX AERO
INHALATION_SPRAY | CUTANEOUS | Status: DC | PRN
  Administered 2015-08-02 (×2): 1 via TOPICAL

## 2015-08-02 MED ORDER — SODIUM CHLORIDE 0.9 % IV SOLN: INTRAVENOUS | Status: DC

## 2015-08-02 MED ORDER — LIDOCAINE VISCOUS 2 % MT SOLN
OROMUCOSAL | Status: AC
  Filled 2015-08-02: qty 15

## 2015-08-02 MED ORDER — LACTATED RINGERS IV SOLN
INTRAVENOUS | Status: DC
  Administered 2015-08-02: 11:00:00 via INTRAVENOUS
  Administered 2015-08-02: 10 mL via INTRAVENOUS

## 2015-08-02 MED ORDER — PROPOFOL 10 MG/ML IV BOLUS
INTRAVENOUS | Status: DC | PRN
  Administered 2015-08-02: 20 mg via INTRAVENOUS

## 2015-08-02 NOTE — Interval H&P Note (Signed)
History and Physical Interval Note:  08/02/2015 11:17 AM  [name]  has presented today for surgery, with the diagnosis of A FLUTTER  The various methods of treatment have been discussed with the patient and family. After consideration of risks, benefits and other options for treatment, the patient has consented to  Procedure(s): TRANSESOPHAGEAL ECHOCARDIOGRAM (TEE) (N/A) CARDIOVERSION (N/A) as a surgical intervention .  The patient's history has been reviewed, patient examined, no change in status, stable for surgery.  I have reviewed the patient's chart and labs.  Questions were answered to the patient's satisfaction.     Ruhani Umland R

## 2015-08-02 NOTE — Interval H&P Note (Signed)
History and Physical Interval Note:  08/02/2015 10:12 AM  [name]  has presented today for surgery, with the diagnosis of A FLUTTER  The various methods of treatment have been discussed with the patient and family. After consideration of risks, benefits and other options for treatment, the patient has consented to  Procedure(s): TRANSESOPHAGEAL ECHOCARDIOGRAM (TEE) (N/A) CARDIOVERSION (N/A) as a surgical intervention .  The patient's history has been reviewed, patient examined, no change in status, stable for surgery.  I have reviewed the patient's chart and labs.  Questions were answered to the patient's satisfaction.     [name]

## 2015-08-02 NOTE — Telephone Encounter (Signed)
Patient needs to see extender on Thursday 12/8 for followup of med change with Lopressor increased to 50mg  BID for rate control.

## 2015-08-02 NOTE — Anesthesia Procedure Notes (Signed)
Procedure Name: MAC Date/Time: 08/02/2015 11:20 AM Performed by: Merrilyn Puma B Pre-anesthesia Checklist: Timeout performed, Patient identified, Emergency Drugs available, Suction available and Patient being monitored Patient Re-evaluated:Patient Re-evaluated prior to inductionOxygen Delivery Method: Nasal cannula Preoxygenation: Pre-oxygenation with 100% oxygen Intubation Type: IV induction Placement Confirmation: positive ETCO2 and breath sounds checked- equal and bilateral Dental Injury: Teeth and Oropharynx as per pre-operative assessment

## 2015-08-02 NOTE — Progress Notes (Signed)
  Echocardiogram Echocardiogram Transesophageal has been performed.  [name] 08/02/2015, 11:51 AM

## 2015-08-02 NOTE — Discharge Instructions (Signed)

## 2015-08-02 NOTE — Transfer of Care (Signed)
Immediate Anesthesia Transfer of Care Note  Patient: [name]  Procedure(s) Performed: Procedure(s): TRANSESOPHAGEAL ECHOCARDIOGRAM (TEE) (N/A)  Patient Location: endoscopy  Anesthesia Type:MAC  Level of Consciousness: awake and alert   Airway & Oxygen Therapy: Patient Spontanous Breathing and Patient connected to nasal cannula oxygen  Post-op Assessment: Report given to RN and Post -op Vital signs reviewed and stable  Post vital signs: Reviewed and stable  Last Vitals:  Filed Vitals:   08/02/15 0941  BP: 176/90  Pulse: 91  Temp: 36.4 C  Resp: 21    Complications: No apparent anesthesia complications

## 2015-08-02 NOTE — CV Procedure (Signed)
    PROCEDURE NOTE:  Procedure:  Transesophageal echocardiogram Operator:  Fransico Him, MD Indications:  Atrial flutter Complications: None IV Meds:Propafol per anesthesia  Results: Left ventricle: There was severe concentric hypertrophy. Systolic function was normal. The estimated ejection fraction was in the range of 55% to 60%. Wall motion was normal; there were no regional wall motion abnormalities. - Aortic valve: There are thin mobile filamentous densities noted on the aortic side of the AV most consistent with Lamble&'s excrescences. These appear to intermittently prolapse into the LVOT. There was trivial regurgitation. - Aorta: Mildly dilated aortic root at sinuses of Valsalva measuring 4 cm. - Mitral valve: No evidence of vegetation. There was mild regurgitation. - Left atrium: There is no evidence of thrombus in the LA appendage. The LA is dilated with significant spontaneous echo contrast near the MV inflow tract consistent with sluggish flow. There is significant swirling in this area with possible early forming thrombus. There is a mobile bright very small target that is seen in this area intermittently of unknown significance. Cannot discern whether this is a torn chordae tendinae or small thrombus. Would expect more mitral regurgitation if this were a ruptured chord. Would consider this a possible thrombus in setting of significant smoke. No evidence of thrombus in the atrial cavity or appendage. - Right atrium: No evidence of thrombus in the atrial cavity or appendage. - Atrial septum: There was increased thickness of the septum, consistent with lipomatous hypertrophy. - Tricuspid valve: There was trivial regurgitation. - Pulmonic valve: No evidence of vegetation. There was trivial regurgitation.  Due to concern for early forming thrombus in the LA, cardioversion was cancelled.  Patient's heart rate was mildly  increased so he was instructed to increase Lopressor to 50mg  BID and he will be seen by our extender in the office on Friday.  The patient tolerated the procedure well and was transferred back to their room in stable condition.  Signed: Fransico Him, MD [facility]

## 2015-08-02 NOTE — Telephone Encounter (Signed)
Ok 30 d 

## 2015-08-02 NOTE — Anesthesia Postprocedure Evaluation (Signed)
Anesthesia Post Note  Patient: [name]  Procedure(s) Performed: Procedure(s) (LRB): TRANSESOPHAGEAL ECHOCARDIOGRAM (TEE) (N/A)  Patient location during evaluation: PACU Anesthesia Type: MAC Level of consciousness: awake and alert Pain management: satisfactory to patient Vital Signs Assessment: post-procedure vital signs reviewed and stable Respiratory status: spontaneous breathing Cardiovascular status: blood pressure returned to baseline Anesthetic complications: no    Last Vitals:  Filed Vitals:   08/02/15 1211 08/02/15 1230  BP: 115/75 160/99  Pulse:    Temp:    Resp:      Last Pain: There were no vitals filed for this visit.               Tiajuana Amass
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
## 2015-08-02 NOTE — Anesthesia Preprocedure Evaluation (Addendum)
Anesthesia Evaluation  Patient identified by MRN, date of birth, ID band Patient awake    Airway Mallampati: II  TM Distance: >3 FB     Dental  (+) Dental Advisory Given   Pulmonary COPD, former smoker,    breath sounds clear to auscultation       Cardiovascular hypertension, Pt. on medications and Pt. on home beta blockers + CAD and + Peripheral Vascular Disease   Rhythm:Irregular Rate:Normal     Neuro/Psych  Headaches,    GI/Hepatic   Endo/Other    Renal/GU      Musculoskeletal   Abdominal   Peds  Hematology   Anesthesia Other Findings   Reproductive/Obstetrics                            Anesthesia Physical Anesthesia Plan  ASA: III  Anesthesia Plan: MAC   Post-op Pain Management:    Induction: Intravenous  Airway Management Planned: Nasal Cannula  Additional Equipment:   Intra-op Plan:   Post-operative Plan:   Informed Consent: I have reviewed the patients History and Physical, chart, labs and discussed the procedure including the risks, benefits and alternatives for the proposed anesthesia with the patient or authorized representative who has indicated his/her understanding and acceptance.   Dental advisory given  Plan Discussed with: Anesthesiologist, Surgeon and CRNA  Anesthesia Plan Comments:        Anesthesia Quick Evaluation

## 2015-08-03 ENCOUNTER — Other Ambulatory Visit: Payer: Self-pay | Admitting: Family Medicine

## 2015-08-03 ENCOUNTER — Encounter (HOSPITAL_COMMUNITY): Payer: Self-pay | Admitting: Cardiology

## 2015-08-03 ENCOUNTER — Ambulatory Visit: Payer: Medicare Other | Admitting: Family Medicine

## 2015-08-05 ENCOUNTER — Ambulatory Visit (INDEPENDENT_AMBULATORY_CARE_PROVIDER_SITE_OTHER): Payer: Medicare Other | Admitting: Family Medicine

## 2015-08-05 ENCOUNTER — Encounter: Payer: Self-pay | Admitting: Family Medicine

## 2015-08-05 VITALS — BP 136/84 | Ht 65.0 in | Wt 148.5 lb

## 2015-08-05 DIAGNOSIS — E785 Hyperlipidemia, unspecified: Secondary | ICD-10-CM

## 2015-08-05 DIAGNOSIS — E782 Mixed hyperlipidemia: Secondary | ICD-10-CM

## 2015-08-05 DIAGNOSIS — I483 Typical atrial flutter: Secondary | ICD-10-CM | POA: Diagnosis not present

## 2015-08-05 DIAGNOSIS — Z79899 Other long term (current) drug therapy: Secondary | ICD-10-CM

## 2015-08-05 DIAGNOSIS — I6523 Occlusion and stenosis of bilateral carotid arteries: Secondary | ICD-10-CM | POA: Diagnosis not present

## 2015-08-05 DIAGNOSIS — I10 Essential (primary) hypertension: Secondary | ICD-10-CM

## 2015-08-05 MED ORDER — ATORVASTATIN CALCIUM 40 MG PO TABS: 40.0000 mg | ORAL_TABLET | Freq: Every day | ORAL | Status: DC

## 2015-08-05 MED ORDER — LISINOPRIL 20 MG PO TABS: 20.0000 mg | ORAL_TABLET | Freq: Two times a day (BID) | ORAL | Status: DC

## 2015-08-05 NOTE — Progress Notes (Signed)
   Subjective:    Patient ID: [name], male    DOB: 09-28-1945, 69 y.o.   MRN: [id number]  Hyperlipidemia This is a chronic problem. The current episode started more than 1 year ago. There are no compliance problems.   compliant with lipid medicine. Doesn't miss a dose. Meds reviewed today.  Compliant with lipid medication. Generally does not miss a dose. Meds reviewed today. Old blood work reviewed.   Now back on metotolol, helping some. Compliant blood pressure medicine. Meds reviewed today. No obvious side effects.  Staying active, but notes some fatigue at times with exertion  Right upper arm achey at times. Worse when sleeping on that side. Pain primarily in the lateral bursa region worse with certain motions recurred recalls no acute injury   Patient states no other concerns this visit.  Review of Systems No headache no back pain no change in bowel habits no blood in stools some shortness of breath with exertion no chest pain    Objective:   Physical Exam  Alert vital stable HEENT normal lungs clear heart irregular but controlled rhythm blood pressure good on repeat ankles trace edema Good range of motion shoulder some lateral bursa tenderness     Assessment & Plan:  Impression 1 hypertension good control meds reviewed maintain same #2 hyperlipidemia good control to now. Maintain same meds await further results with new blood work #3right shoulder pain likely element of bursitis local measures range of motion over-the-counter meds discussed#4 atrial flutter discussed maintain same meds. May be facing an attempt at cardioversion discussed WSL follow-up as scheduled further recommendations based blood work

## 2015-08-07 DIAGNOSIS — I4892 Unspecified atrial flutter: Secondary | ICD-10-CM | POA: Insufficient documentation

## 2015-08-11 DIAGNOSIS — Z79899 Other long term (current) drug therapy: Secondary | ICD-10-CM | POA: Diagnosis not present

## 2015-08-11 DIAGNOSIS — E785 Hyperlipidemia, unspecified: Secondary | ICD-10-CM | POA: Diagnosis not present

## 2015-08-12 LAB — LIPID PANEL
CHOLESTEROL TOTAL: 113 mg/dL (ref 100–199)
Chol/HDL Ratio: 2.1 ratio units (ref 0.0–5.0)
HDL: 54 mg/dL (ref >39)
LDL Calculated: 47 mg/dL (ref 0–99)
TRIGLYCERIDES: 59 mg/dL (ref 0–149)
VLDL Cholesterol Cal: 12 mg/dL (ref 5–40)

## 2015-08-12 LAB — HEPATIC FUNCTION PANEL
ALBUMIN: 3.7 g/dL (ref 3.5–4.8)
ALT: 81 IU/L — AB (ref 0–44)
AST: 36 IU/L (ref 0–40)
Alkaline Phosphatase: 107 IU/L (ref 39–117)
BILIRUBIN TOTAL: 1.2 mg/dL (ref 0.0–1.2)
BILIRUBIN, DIRECT: 0.37 mg/dL (ref 0.00–0.40)
Total Protein: 6.1 g/dL (ref 6.0–8.5)

## 2015-08-15 ENCOUNTER — Encounter: Payer: Self-pay | Admitting: Family Medicine

## 2015-08-16 ENCOUNTER — Ambulatory Visit (INDEPENDENT_AMBULATORY_CARE_PROVIDER_SITE_OTHER): Payer: Medicare Other | Admitting: Physician Assistant

## 2015-08-16 ENCOUNTER — Encounter: Payer: Self-pay | Admitting: Physician Assistant

## 2015-08-16 VITALS — BP 148/60 | HR 90 | Ht 65.0 in | Wt 149.4 lb

## 2015-08-16 DIAGNOSIS — I10 Essential (primary) hypertension: Secondary | ICD-10-CM | POA: Diagnosis not present

## 2015-08-16 DIAGNOSIS — I4892 Unspecified atrial flutter: Secondary | ICD-10-CM | POA: Diagnosis not present

## 2015-08-16 DIAGNOSIS — I25700 Atherosclerosis of coronary artery bypass graft(s), unspecified, with unstable angina pectoris: Secondary | ICD-10-CM

## 2015-08-16 DIAGNOSIS — R0602 Shortness of breath: Secondary | ICD-10-CM | POA: Diagnosis not present

## 2015-08-16 DIAGNOSIS — I6523 Occlusion and stenosis of bilateral carotid arteries: Secondary | ICD-10-CM

## 2015-08-16 NOTE — Assessment & Plan Note (Signed)
Stable Without chest pain 

## 2015-08-16 NOTE — Assessment & Plan Note (Signed)
Agents atrial flutter is controlled today but he awakened 2 nights in a row with increased dyspnea. There is no evidence of heart failure on exam today. Has had a history of bradycardia 2 years ago on metoprolol and it was stopped. We'll place the monitor on him to make sure his rate is controlled and not bradycardic or tachycardic. Follow-up with Dr. Luther Parody in 2-3 weeks to be scheduled for cardioversion. Since TEE showed significant swirling that could be possible early thrombus. There was a mobile bright very small target that is seen in the area intermittently of unknown significance. She can't discern whether this was a torn chordae tendon or small thrombus. There was significant smoke. Recommend Xarelto for 1 month before attempted cardioversion.

## 2015-08-16 NOTE — Patient Instructions (Addendum)
Medication Instructions:   CONTINUE ON SAME MEDICATIONS    If you need a refill on your cardiac medications before your next appointment, please call your pharmacy.   Labwork: NONE ORDER TODAY   Testing/Procedures:  Your physician has recommended that you wear a holter monitor. 48 HOURS..TODAY IF POSSIBLE OR ASAP.Marland KitchenHolter monitors are medical devices that record the heart's electrical activity. Doctors most often use these monitors to diagnose arrhythmias. Arrhythmias are problems with the speed or rhythm of the heartbeat. The monitor is a small, portable device. You can wear one while you do your normal daily activities. This is usually used to diagnose what is causing palpitations/syncope (passing out).   Follow-Up: WITH SKAINS IN January OR NEXT AS SOON AS POSSIBLE AVAILABLE APPT FOR CARDIOVERSION   Any Other Special Instructions Will Be Listed Below (If Applicable).

## 2015-08-16 NOTE — Progress Notes (Signed)
Cardiology Office Note   Date:  08/16/2015   ID:  Tareek, Thelen 28-Jun-1946, MRN [id number]  PCP:  Mickie Hillier, MD  Cardiologist:  Dr. Marlou Porch  Chief Complaint:    History of Present Illness: [name] is a 69 y.o. male who presents for post hospital follow-up. He has CAD status post CABG in 2008. He had postop atrial fibrillation but had had no recurrence until he presented on 07/29/15 complaining of dizziness and near syncope. Was found to be in atrial flutter and was placed on Xarelto and set up for TEE guided cardioversion.CHADSVASC=3. TEE showed questionable clot in the left atrium so cardioversion was canceled. Lopressor was increased and he is here today for follow-up. It should be noted he had bradycardia at the rate of 45, 2 years ago medications were adjusted. His Lopressor was increased to 50 mg twice a day. He also has HTN, HLD, carotid disease.  Patient comes in today complaining of increased shortness of breath. He awakened Friday and Saturday evening short of breath and had to sit up for about 3 hours. Last night his breathing was fine. He says ever since the metoprolol was increased at the hospital he has some dyspnea on exertion but it seems to be getting a little better. His heart rate was 141 bpm the hospital and Dr. Radford Pax increased his metoprolol.    Past Medical History  Diagnosis Date  . Hypertension   . CAD (coronary artery disease)   . Headache(784.0)   . COPD (chronic obstructive pulmonary disease) ([place])   . Gastritis   . Bilateral carotid artery disease ([place]) 07/21/2013    Past Surgical History  Procedure Laterality Date  . Coronary artery bypass graft    . Incision and drainage deep neck abscess    . Tee without cardioversion N/A 08/02/2015    Procedure: TRANSESOPHAGEAL ECHOCARDIOGRAM (TEE);  Surgeon: Sueanne Margarita, MD;  Location: [facility] ENDOSCOPY;  Service: Cardiovascular;  Laterality: N/A;     Current Outpatient Prescriptions  Medication Sig  Dispense Refill  . ALPRAZolam (XANAX) 0.25 MG tablet Take 0.25 mg by mouth. Take one-half to one tablet by mouth twice daily as needed for anxiety. MUST LAST ONE MONTH    . atorvastatin (LIPITOR) 40 MG tablet Take 1 tablet (40 mg total) by mouth daily. 90 tablet 1  . lisinopril (PRINIVIL,ZESTRIL) 20 MG tablet Take 1 tablet (20 mg total) by mouth 2 (two) times daily. 180 tablet 1  . metoprolol tartrate (LOPRESSOR) 25 MG tablet Take 2 tablets (50 mg total) by mouth 2 (two) times daily. 60 tablet 6  . Multiple Vitamins-Minerals (MULTIVITAMIN WITH MINERALS) tablet Take 1 tablet by mouth daily.    . nitroGLYCERIN (NITROSTAT) 0.4 MG SL tablet Place 1 tablet (0.4 mg total) under the tongue every 5 (five) minutes as needed for chest pain. 25 tablet 6  . Omega-3 Fatty Acids (FISH OIL) 1200 MG CAPS Take 1 capsule by mouth daily.     . rivaroxaban (XARELTO) 20 MG TABS tablet Take 1 tablet (20 mg total) by mouth daily with supper. 30 tablet 6  . vitamin C (ASCORBIC ACID) 500 MG tablet Take 500 mg by mouth daily.     No current facility-administered medications for this visit.    Allergies:   Doxycycline and Nsaids    Social History:  The patient  reports that he has quit smoking. He does not have any smokeless tobacco history on file.   Family History:  The patient's  family history includes Diabetes in his mother and sister; Heart attack in his father; Hypertension in his mother and sister; Sudden death in his father.    ROS:  Please see the history of present illness.   Otherwise, review of systems are positive for none.   All other systems are reviewed and negative.    PHYSICAL EXAM: VS:  BP 148/60 mmHg  Pulse 90  Ht 5\' 5"  (1.651 m)  Wt 149 lb 6.4 oz (67.767 kg)  BMI 24.86 kg/m2  SpO2 99% , BMI Body mass index is 24.86 kg/(m^2). GEN: Well nourished, well developed, in no acute distress Neck: no JVD, HJR, carotid bruits, or masses Cardiac:  RRR; no murmurs,gallop, rubs, thrill or heave,   Respiratory:  clear to auscultation bilaterally, normal work of breathing GI: soft, nontender, nondistended, + BS MS: no deformity or atrophy Extremities: without cyanosis, clubbing, edema, good distal pulses bilaterally.  Skin: warm and dry, no rash Neuro:  Strength and sensation are intact    EKG:  EKG is ordered today. The ekg ordered today demonstrates v with ST-T wave abnormality   Recent Labs: 07/29/2015: BUN 18; Creat 1.23*; Hemoglobin 13.7; Platelets 226; Potassium 4.2; Sodium 141 08/11/2015: ALT 81*    Lipid Panel    Component Value Date/Time   CHOL 113 08/11/2015 0856   CHOL 120 08/03/2014 0822   TRIG 59 08/11/2015 0856   HDL 54 08/11/2015 0856   HDL 47 08/03/2014 0822   CHOLHDL 2.1 08/11/2015 0856   CHOLHDL 2.6 08/03/2014 0822   VLDL 10 08/03/2014 0822   LDLCALC 47 08/11/2015 0856   LDLCALC 63 08/03/2014 0822      Wt Readings from Last 3 Encounters:  08/16/15 149 lb 6.4 oz (67.767 kg)  08/05/15 148 lb 8 oz (67.359 kg)  08/02/15 150 lb (68.04 kg)      Other studies Reviewed: Additional studies/ records that were reviewed today include and review of the records demonstrates:  TEE:  Results: Left ventricle: There was severe concentric hypertrophy. Systolic   function was normal. The estimated ejection fraction was in the   range of 55% to 60%. Wall motion was normal; there were no   regional wall motion abnormalities. - Aortic valve: There are thin mobile filamentous densities noted   on the aortic side of the AV most consistent with Lamble&'s   excrescences. These appear to intermittently prolapse into the   LVOT. There was trivial regurgitation. - Aorta: Mildly dilated aortic root at sinuses of Valsalva   measuring 4 cm. - Mitral valve: No evidence of vegetation. There was mild   regurgitation. - Left atrium: There is no evidence of thrombus in the LA   appendage. The LA is dilated with significant spontaneous echo   contrast near the MV inflow  tract consistent with sluggish flow.   There is significant swirling in this area with possible early   forming thrombus. There is a mobile bright very small target that   is seen in this area intermittently of unknown significance.   Cannot discern whether this is a torn chordae tendinae or small   thrombus. Would expect more mitral regurgitation if this were a   ruptured chord. Would consider this a possible thrombus in   setting of significant smoke. No evidence of thrombus in the   atrial cavity or appendage. - Right atrium: No evidence of thrombus in the atrial cavity or   appendage. - Atrial septum: There was increased thickness of the septum,  consistent with lipomatous hypertrophy. - Tricuspid valve: There was trivial regurgitation. - Pulmonic valve: No evidence of vegetation. There was trivial   regurgitation.  Due to concern for early forming thrombus in the LA, cardioversion was cancelled.  Patient's heart rate was mildly increased so he was instructed to increase Lopressor to 50mg  BID and he will be seen by our extender in the office on Friday.  The patient tolerated the procedure well and was transferred back to their room in stable condition.  Carotid dopplers: 07/27/15: Notes Recorded by Jerline Pain, MD on 07/28/2015 at 6:15 AM Heterogeneous plaque, bilaterally.  Stable 60-79% RICA stenosis.  Stable 123456 LICA stenosis.  Essentially stable >50% stenosis in the bilateral ECA's.  Normal subclavian arteries, bilaterally.  Patent vertebral arteries with antegrade flow.  F/U 1 year  SKAINS, MARK, MD   ASSESSMENT AND PLAN:  Atrial flutter ([place]) Agents atrial flutter is controlled today but he awakened 2 nights in a row with increased dyspnea. There is no evidence of heart failure on exam today. Has had a history of bradycardia 2 years ago on metoprolol and it was stopped. We'll place the monitor on him to make sure his rate is controlled and not bradycardic or  tachycardic. Follow-up with Dr. Luther Parody in 2-3 weeks to be scheduled for cardioversion. Since TEE showed significant swirling that could be possible early thrombus. There was a mobile bright very small target that is seen in the area intermittently of unknown significance. She can't discern whether this was a torn chordae tendon or small thrombus. There was significant smoke. Recommend Xarelto for 1 month before attempted cardioversion.  CAD (coronary artery disease) of artery bypass graft Stable Without chest pain  Essential hypertension, benign Blood pressure controlled    Signed, Ermalinda Barrios, PA-C  08/16/2015 10:01 AM    [facility] [place], [place], [place]  [address] Phone: [phone]; Fax: [phone]

## 2015-08-16 NOTE — Assessment & Plan Note (Signed)
Blood pressure controlled. 

## 2015-08-17 ENCOUNTER — Ambulatory Visit (HOSPITAL_COMMUNITY)
Admission: RE | Admit: 2015-08-17 | Discharge: 2015-08-17 | Disposition: A | Discharge status: Home / Self Care | Payer: Medicare Other | Source: Ambulatory Visit | Attending: Physician Assistant | Admitting: Physician Assistant

## 2015-08-17 DIAGNOSIS — R0602 Shortness of breath: Secondary | ICD-10-CM

## 2015-08-17 DIAGNOSIS — I4892 Unspecified atrial flutter: Secondary | ICD-10-CM | POA: Insufficient documentation

## 2015-08-27 ENCOUNTER — Telehealth: Payer: Self-pay | Admitting: *Deleted

## 2015-08-27 NOTE — Telephone Encounter (Signed)
-----   Message from Imogene Burn, PA-C sent at 08/27/2015 11:06 AM EST ----- Heart rate pretty well controlled but a little on the high side. Should have f/u with Dr. Marlou Porch soon. Make sure his symptoms are stable

## 2015-08-27 NOTE — Telephone Encounter (Signed)
Called pt re: holter monitor results.  Pt has been made aware that his heart rate was pretty well controlled, but was a little on the high side.  Pt advises that he is not having any symptoms that he is aware of.  Pt was advised that if he starting feeling like something was not right, to call the office.  I told him that I would let Estella Husk, PA-C know that his f/u appt was 09/10/15 with Dr. Marlou Porch.  Pt verbalized understanding.

## 2015-08-31 ENCOUNTER — Other Ambulatory Visit: Payer: Self-pay | Admitting: Family Medicine

## 2015-08-31 NOTE — Telephone Encounter (Signed)
Ok plus five monthly ref 

## 2015-09-06 ENCOUNTER — Encounter: Payer: Self-pay | Admitting: Nurse Practitioner

## 2015-09-06 ENCOUNTER — Ambulatory Visit (INDEPENDENT_AMBULATORY_CARE_PROVIDER_SITE_OTHER): Payer: Medicare Other | Admitting: Nurse Practitioner

## 2015-09-06 ENCOUNTER — Other Ambulatory Visit: Payer: Self-pay | Admitting: Nurse Practitioner

## 2015-09-06 ENCOUNTER — Ambulatory Visit
Admission: RE | Admit: 2015-09-06 | Discharge: 2015-09-06 | Disposition: A | Discharge status: Home / Self Care | Payer: Medicare Other | Source: Ambulatory Visit | Attending: Nurse Practitioner | Admitting: Nurse Practitioner

## 2015-09-06 ENCOUNTER — Telehealth: Payer: Self-pay | Admitting: Cardiology

## 2015-09-06 VITALS — BP 138/80 | HR 96 | Ht 65.0 in | Wt 151.8 lb

## 2015-09-06 DIAGNOSIS — R0600 Dyspnea, unspecified: Secondary | ICD-10-CM | POA: Diagnosis not present

## 2015-09-06 DIAGNOSIS — J449 Chronic obstructive pulmonary disease, unspecified: Secondary | ICD-10-CM | POA: Diagnosis not present

## 2015-09-06 DIAGNOSIS — I480 Paroxysmal atrial fibrillation: Secondary | ICD-10-CM | POA: Diagnosis not present

## 2015-09-06 DIAGNOSIS — Z01810 Encounter for preprocedural cardiovascular examination: Secondary | ICD-10-CM | POA: Diagnosis not present

## 2015-09-06 DIAGNOSIS — I4891 Unspecified atrial fibrillation: Secondary | ICD-10-CM | POA: Diagnosis not present

## 2015-09-06 LAB — BASIC METABOLIC PANEL
BUN: 17 mg/dL (ref 7–25)
CO2: 25 mmol/L (ref 20–31)
Calcium: 8.8 mg/dL (ref 8.6–10.3)
Chloride: 104 mmol/L (ref 98–110)
Creat: 1.14 mg/dL (ref 0.70–1.18)
Glucose, Bld: 95 mg/dL (ref 65–99)
Potassium: 4.2 mmol/L (ref 3.5–5.3)
Sodium: 138 mmol/L (ref 135–146)

## 2015-09-06 LAB — CBC
HCT: 38.9 % — ABNORMAL LOW (ref 39.0–52.0)
Hemoglobin: 13 g/dL (ref 13.0–17.0)
MCH: 30.7 pg (ref 26.0–34.0)
MCHC: 33.4 g/dL (ref 30.0–36.0)
MCV: 92 fL (ref 78.0–100.0)
MPV: 10 fL (ref 8.6–12.4)
Platelets: 171 10*3/uL (ref 150–400)
RBC: 4.23 MIL/uL (ref 4.22–5.81)
RDW: 13.8 % (ref 11.5–15.5)
WBC: 9.9 10*3/uL (ref 4.0–10.5)

## 2015-09-06 MED ORDER — FUROSEMIDE 20 MG PO TABS: 20.0000 mg | ORAL_TABLET | Freq: Every day | ORAL | Status: DC

## 2015-09-06 NOTE — Progress Notes (Signed)
CARDIOLOGY OFFICE NOTE  Date:  09/06/2015    [name] Date of Birth: Nov 09, 1945 Medical Record [id number]  PCP:  Mickie Hillier, MD  Cardiologist:  [facility]    Chief Complaint  Patient presents with  . Shortness of Breath    Work in visit - seen for Dr. Marlou Porch  . Atrial Flutter    History of Present Illness: [name] is a 70 y.o. male who presents today for a work in visit. Seen for Dr. Marlou Porch.   He has known CAD status post CABG in 2008 by Dr. Darcey Nora. He had postop atrial fibrillation but had had no recurrence until he presented on 07/29/15 complaining of dizziness and near syncope. Was found to be in atrial flutter and was placed on Xarelto and set up for TEE guided cardioversion.CHADSVASC=3. TEE showed questionable clot in the left atrium so cardioversion was canceled. Lopressor was increased due to elevated HR. It should be noted he had bradycardia at the rate of 45 two years ago and had metoprolol stopped. Other issues include HTN, HLD, COPD & carotid disease.  Saw Ermalinda Barrios, PA in mid December - complained of dyspnea. Did not appear to be volume overloaded. Holter was arranged. Was to see Dr. Marlou Porch back in mid January to arrange another attempt at cardioversion.   Phone call today - "C/O SOB worsening x 3 weeks, pt feels it may have to do with the increase of his metoprolol 3 weeks ago. He was scheduled for a cardioversion 08/02/15 that was cancelled, he was placed on Xarelto and is waiting for reschedule. Pt states he can not lay down to sleep has to sleep in chair or propped on several pillows.  Denies edema in legs/ abdomen. Pt does have a bit of congestion and cough.  Pt states he is having times when his breathing is in a panting pattern. When he sits upright it will stop.  Current bp 114/84 p 78. I will discuss with Dr Marlou Porch for further advice. Pt to be seen today/ placed on Truitt Merle NP schedule."  Thus added to my schedule for today.   Comes in  today. Here with his wife. He remains short of breath with any exertion. Can't lie down. Sleeping upright in the recliner. As long as he is upright he is "ok". When he lies back - he is short of breath. Has had persistent cough - some white sputum. No chest pain. Not really dizzy. No syncope. Weight is up just a few pounds.  Past Medical History  Diagnosis Date  . Hypertension   . CAD (coronary artery disease)   . Headache(784.0)   . COPD (chronic obstructive pulmonary disease) ([place])   . Gastritis   . Bilateral carotid artery disease ([place]) 07/21/2013    Past Surgical History  Procedure Laterality Date  . Coronary artery bypass graft    . Incision and drainage deep neck abscess    . Tee without cardioversion N/A 08/02/2015    Procedure: TRANSESOPHAGEAL ECHOCARDIOGRAM (TEE);  Surgeon: Sueanne Margarita, MD;  Location: [facility] ENDOSCOPY;  Service: Cardiovascular;  Laterality: N/A;     Medications: Current Outpatient Prescriptions  Medication Sig Dispense Refill  . acetaminophen (TYLENOL) 500 MG tablet Take 500 mg by mouth every 4 (four) hours as needed.    . ALPRAZolam (XANAX) 0.25 MG tablet TAKE ONE-HALF TO ONE TABLET BY MOUTH TWICE DAILY AS NEEDED (MUST  LAST  ONE  MONTH) 30 tablet 5  . atorvastatin (LIPITOR) 40  MG tablet Take 1 tablet (40 mg total) by mouth daily. 90 tablet 1  . dextromethorphan-guaiFENesin (MUCINEX DM) 30-600 MG 12hr tablet Take 1 tablet by mouth 2 (two) times daily as needed for cough.    [name] lisinopril (PRINIVIL,ZESTRIL) 20 MG tablet Take 1 tablet (20 mg total) by mouth 2 (two) times daily. 180 tablet 1  . metoprolol tartrate (LOPRESSOR) 25 MG tablet Take 2 tablets (50 mg total) by mouth 2 (two) times daily. 60 tablet 6  . Multiple Vitamins-Minerals (MULTIVITAMIN WITH MINERALS) tablet Take 1 tablet by mouth daily.    . nitroGLYCERIN (NITROSTAT) 0.4 MG SL tablet Place 1 tablet (0.4 mg total) under the tongue every 5 (five) minutes as needed for chest pain. 25 tablet 6  .  Omega-3 Fatty Acids (FISH OIL) 1200 MG CAPS Take 1 capsule by mouth daily.     . rivaroxaban (XARELTO) 20 MG TABS tablet Take 1 tablet (20 mg total) by mouth daily with supper. 30 tablet 6  . vitamin C (ASCORBIC ACID) 500 MG tablet Take 500 mg by mouth daily.    . furosemide (LASIX) 20 MG tablet Take 1 tablet (20 mg total) by mouth daily. 10 tablet 6   No current facility-administered medications for this visit.    Allergies: Allergies  Allergen Reactions  . Doxycycline Shortness Of Breath    Also has to urinate more often  . Nsaids     Gastritis     Social History: The patient  reports that he has quit smoking. He does not have any smokeless tobacco history on file.   Family History: The patient's family history includes Diabetes in his mother and sister; Heart attack in his father; Hypertension in his mother and sister; Sudden death in his father.   Review of Systems: Please see the history of present illness.   Otherwise, the review of systems is positive for none.   All other systems are reviewed and negative.   Physical Exam: VS:  BP 138/80 mmHg  Pulse 96  Ht 5\' 5"  (1.651 m)  Wt 151 lb 12.8 oz (68.856 kg)  BMI 25.26 kg/m2  SpO2 97% .  BMI Body mass index is 25.26 kg/(m^2).  Wt Readings from Last 3 Encounters:  09/06/15 151 lb 12.8 oz (68.856 kg)  08/16/15 149 lb 6.4 oz (67.767 kg)  08/05/15 148 lb 8 oz (67.359 kg)    General: Pleasant. He looks chronically ill but in no acute distress. When he laid back on the exam table - oxygen sat dropped to 83%. Increases to 97% when upright.  HEENT: Normal. Neck: Supple, no JVD, carotid bruits, or masses noted.  Cardiac: Irregular rhythm. Rate fair. Heart tones are distant. No edema.  Respiratory:  Lungs are fairly clear and with normal work of breathing at rest.  GI: Soft and nontender.  MS: No deformity or atrophy. Gait and ROM intact. Skin: Warm and dry. Color is normal.  Neuro:  Strength and sensation are intact and no  gross focal deficits noted.  Psych: Alert, appropriate and with normal affect.   LABORATORY DATA:  EKG:  EKG is ordered today. This demonstrates atrial fib with a ventricular response of 96. Diffuse T wave changes noted. Reviewed with Dr. Marlou Porch.   Lab Results  Component Value Date   WBC 8.5 07/29/2015   HGB 13.7 07/29/2015   HCT 41.5 07/29/2015   PLT 226 07/29/2015   GLUCOSE 79 07/29/2015   CHOL 113 08/11/2015   TRIG 59 08/11/2015   HDL  54 08/11/2015   LDLCALC 47 08/11/2015   ALT 81* 08/11/2015   AST 36 08/11/2015   NA 141 07/29/2015   K 4.2 07/29/2015   CL 105 07/29/2015   CREATININE 1.23* 07/29/2015   BUN 18 07/29/2015   CO2 27 07/29/2015   TSH 0.709 Test methodology is 3rd generation TSH 03/09/2007   PSA 0.8 01/08/2015   INR 1.3 03/06/2007   HGBA1C  03/05/2007    5.8 (NOTE)   The ADA recommends the following therapeutic goals for glycemic   control related to Hgb A1C measurement:   Goal of Therapy:   < 7.0% Hgb A1C   Action Suggested:  > 8.0% Hgb A1C   Ref:  Diabetes Care, 22, Suppl. 1, 1999    BNP (last 3 results) No results for input(s): BNP in the last 8760 hours.  ProBNP (last 3 results) No results for input(s): PROBNP in the last 8760 hours.   Other Studies Reviewed Today:  TEE FROM 08/02/2015:  Results: Left ventricle: There was severe concentric hypertrophy. Systolic function was normal. The estimated ejection fraction was in the range of 55% to 60%. Wall motion was normal; there were no regional wall motion abnormalities. - Aortic valve: There are thin mobile filamentous densities noted on the aortic side of the AV most consistent with Lamble&'s excrescences. These appear to intermittently prolapse into the LVOT. There was trivial regurgitation. - Aorta: Mildly dilated aortic root at sinuses of Valsalva measuring 4 cm. - Mitral valve: No evidence of vegetation. There was mild regurgitation. - Left atrium: There is no evidence of  thrombus in the LA appendage. The LA is dilated with significant spontaneous echo contrast near the MV inflow tract consistent with sluggish flow. There is significant swirling in this area with possible early forming thrombus. There is a mobile bright very small target that is seen in this area intermittently of unknown significance. Cannot discern whether this is a torn chordae tendinae or small thrombus. Would expect more mitral regurgitation if this were a ruptured chord. Would consider this a possible thrombus in setting of significant smoke. No evidence of thrombus in the atrial cavity or appendage. - Right atrium: No evidence of thrombus in the atrial cavity or appendage. - Atrial septum: There was increased thickness of the septum, consistent with lipomatous hypertrophy. - Tricuspid valve: There was trivial regurgitation. - Pulmonic valve: No evidence of vegetation. There was trivial regurgitation.  Due to concern for early forming thrombus in the LA, cardioversion was cancelled. Patient's heart rate was mildly increased so he was instructed to increase Lopressor to 50mg  BID and he will be seen by our extender in the office on Friday.   Carotid dopplers: 07/27/15: Notes Recorded by Jerline Pain, MD on 07/28/2015 at 6:15 AM Heterogeneous plaque, bilaterally.  Stable 60-79% RICA stenosis.  Stable 123456 LICA stenosis.  Essentially stable >50% stenosis in the bilateral ECA's.  Normal subclavian arteries, bilaterally.  Patent vertebral arteries with antegrade flow.  F/U 1 year   Holter Study Highlights from 07/2015    Atrial flutter Rats 57 to 165 bpm Average HR 105 bpm  Frequent PVCs , couplet. 1 7 beat run NSVT. No pauses       ASSESSMENT AND PLAN:  1. Atrial flutter (HCC)/atrial fib - only with fair rate control. I worry that he has tachybrady syndrome. PPM implant may be needed and this was discussed briefly. Will proceed on  with repeat attempt at TEE/cardioversion - he has been on his Xarelto for  over one month since last attempt on 08/02/2015 - no missed doses reported. The procedure has been discussed in detail and he is willing to proceed. We will check labs today, send for CXR as well and I am starting him on Lasix 20 mg - to take one tablet this evening and repeat again in the AM. I suspect he has some degree of loss of atrial kick given the AF/diastolic HF.   2. Dyspnea - most likely multifactorial from COPD/diastolic dysfunction  3. CAD (coronary artery disease) of artery bypass graft - no reports of chest pain  4. Essential hypertension, benign - BP is ok on current regimen.  5. COPD - not smoking  6. Carotid disease - repeat study 06/2016   Current medicines are reviewed with the patient today.  The patient does not have concerns regarding medicines other than what has been noted above.  The following changes have been made:  See above.  Labs/ tests ordered today include:    Orders Placed This Encounter  Procedures  . DG Chest 2 View  . Basic metabolic panel  . Brain natriuretic peptide  . CBC  . Protime-INR  . APTT  . EKG 12-Lead     Disposition:   FU with Dr. Marlou Porch as planned later this week unless deemed otherwise.   Patient is agreeable to this plan and will call if any problems develop in the interim.   Signed: Burtis Junes, RN, ANP-C 09/06/2015 3:07 PM  [facility] [address] [place], [place]  [address] Phone: [phone] Fax: [phone]

## 2015-09-06 NOTE — Telephone Encounter (Signed)
C/O SOB worsening x 3 weeks, pt feels it may have to do with the increase of his metoprolol 3 weeks ago. He was scheduled for a cardioversion 08/02/15 that was cancelled, he was placed on Xarelto and is waiting for reschedule. Pt states he can not lay down to sleep has to sleep in chair or propped on several pillows. Denies edema in legs/ abdomen. Pt does have a bit of congestion and cough. Pt states he is having times when his breathing is in a panting pattern. When he sits upright it will stop. Current bp 114/84 p 78 I will discuss with Dr Marlou Porch for further advice.  Pt to be seen today/ placed on Truitt Merle NP schedule.

## 2015-09-06 NOTE — Patient Instructions (Addendum)
We will be checking the following labs today - BMET, BNP, CBC, PT, PTT  Please go to [facility] to [place] on the first floor for a chest Xray - you may walk in.    Medication Instructions:    Continue with your current medicines. This includes the metoprolol.  I am starting Lasix 20 mg a day - take this today and again tomorrow morning - then stop - this is at your pharmacy    Testing/Procedures To Be Arranged:  TEE/cardioversion  Follow-Up:   Will see how you test turns out and then decide about your visit for Friday     Other Special Instructions:  Your provider has recommended a TEE/cardioversion.   You are scheduled for a TEE/cardioversion on Wednesday, January 11th at [facility] with Dr. Acie Fredrickson or associates. Please go to [facility] [place] Stay at Wednesday, January 11th by 12:30 PM.  Enter through the [place] not have any food or drink after midnight on Tuesday.  You may take your medicines with a sip of water on the day of your procedure. DO NOT EAT You will need someone to drive you home following your procedure.   Call the [place] office at [phone] if you have any questions, problems or concerns.     Transesophageal Echocardiogram Transesophageal echocardiography (TEE) is a special type of test that produces images of the heart by using sound waves (echocardiogram). This type of echocardiography can obtain better images of the heart than standard echocardiography. TEE is done by passing a flexible tube down the esophagus. The heart is located in front of the esophagus. Because the heart and esophagus are close to one another, your health care provider can take very clear, detailed pictures of the heart via ultrasound waves. TEE may be done:  If your health care provider needs more information based on standard echocardiography findings.  If you had a stroke. This might have happened  because a clot formed in your heart. TEE can visualize different areas of the heart and check for clots.  To check valve anatomy and function.  To check for infection on the inside of your heart (endocarditis).  To evaluate the dividing wall (septum) of the heart and presence of a hole that did not close after birth (patent foramen ovale or atrial septal defect).  To help diagnose a tear in the wall of the aorta (aortic dissection).  During cardiac valve surgery. This allows the surgeon to assess the valve repair before closing the chest.  During a variety of other cardiac procedures to guide positioning of catheters.  Sometimes before a cardioversion, which is a shock to convert heart rhythm back to normal.  LET [facility] CARE PROVIDER KNOW ABOUT:   Any allergies you have.  All medicines you are taking, including vitamins, herbs, eye drops, creams, and over-the-counter medicines.  Previous problems you or members of your family have had with the use of anesthetics.  Any blood disorders you have.  Previous surgeries you have had.  Medical conditions you have.  Swallowing difficulties.  An esophageal obstruction.  RISKS AND COMPLICATIONS  Generally, TEE is a safe procedure. However, as with any procedure, complications can occur. Possible complications include an esophageal tear (rupture), perforation, aspiration and/or oversedation. You may have a sore throat following this procedure.  BEFORE THE PROCEDURE   Do not eat or drink for 6 hours before the procedure or as directed by your health  care provider.  Arrange for someone to drive you home after the procedure. Do not drive yourself home. During the procedure, you will be given medicines that can continue to make you feel drowsy and can impair your reflexes.  An IV access tube will be started in the arm.  PROCEDURE   A medicine to help you relax (sedative) will be given through the IV access tube.  A medicine may  be sprayed or gargled to numb the back of the throat.  Your blood pressure, heart rate, and breathing (vital signs) will be monitored during the procedure.  The TEE probe is a long, flexible tube. The tip of the probe is placed into the back of the mouth, and you will be asked to swallow. This helps to pass the tip of the probe into the esophagus. Once the tip of the probe is in the correct area, your health care provider can take pictures of the heart.  TEE is usually not a painful procedure. You may feel the probe press against the back of the throat. The probe does not enter the trachea and does not affect your breathing.  AFTER THE PROCEDURE   You will be in bed, resting, until you have fully returned to consciousness.  When you first awaken, your throat may feel slightly sore and will probably still feel numb. This will improve slowly over time.  You will not be allowed to eat or drink until it is clear that the numbness has improved.  Once you have been able to drink, urinate, and sit on the edge of the bed without feeling sick to your stomach (nausea) or dizzy, you may be cleared to go home.  You should have a friend or family member with you for the next 24 hours after your procedure.    Electrical Cardioversion Electrical cardioversion is the delivery of a jolt of electricity to change the rhythm of the heart. Sticky patches or metal paddles are placed on the chest to deliver the electricity from a device. This is done to restore a normal rhythm. A rhythm that is too fast or not regular keeps the heart from pumping well. Electrical cardioversion is done in an emergency if:  There is low or no blood pressure as a result of the heart rhythm.  Normal rhythm must be restored as fast as possible to protect the brain and heart from further damage.  It may save a life. Cardioversion may be done for heart rhythms that are not immediately life threatening, such as atrial fibrillation or  flutter, in which:  The heart is beating too fast or is not regular.  Medicine to change the rhythm has not worked.  It is safe to wait in order to allow time for preparation. Symptoms of the abnormal rhythm are bothersome. The risk of stroke and other serious problems can be reduced.  LET [facility] CARE PROVIDER KNOW ABOUT:  Any allergies you have. All medicines you are taking, including vitamins, herbs, eye drops, creams, and over-the-counter medicines. Previous problems you or members of your family have had with the use of anesthetics.  Any blood disorders you have.  Previous surgeries you have had.  Medical conditions you have.  RISKS AND COMPLICATIONS  Generally, this is a safe procedure. However, problems can occur and include:  Breathing problems related to the anesthetic used. A blood clot that breaks free and travels to other parts of your body. This could cause a stroke or other problems. The risk  of this is lowered by use of blood-thinning medicine (anticoagulant) prior to the procedure. Cardiac arrest (rare).  BEFORE THE PROCEDURE  You may have tests to detect blood clots in your heart and to evaluate heart function. You may start taking anticoagulants so your blood does not clot as easily.  Medicines may be given to help stabilize your heart rate and rhythm.  PROCEDURE You will be given medicine through an IV tube to reduce discomfort and make you sleepy (sedative).  An electrical shock will be delivered.  AFTER THE PROCEDURE Your heart rhythm will be watched to make sure it does not change. You will need someone to drive you home.    If you need a refill on your cardiac medications before your next appointment, please call your pharmacy.   Call the [place] office at [phone] if you have any questions, problems or concerns.

## 2015-09-06 NOTE — Telephone Encounter (Signed)
New message  Pt c/o Shortness Of Breath: STAT if SOB developed within the last 24 hours or pt is noticeably SOB on the phone  1. Are you currently SOB (can you hear that pt is SOB on the phone)? Yes  2. How long have you been experiencing SOB? About 3 weeks. Its gotten worse within 48 Hours  3. Are you SOB when sitting or when up moving around? Sitting / Laying down. When he stands up its not bad.  4.  Are you currently experiencing any other symptoms? A little headache but nothing major.   Comments; metoprolol was increased. Doubled in Dec 5th. This is when he began the SOB.

## 2015-09-07 LAB — APTT: aPTT: 58 seconds — ABNORMAL HIGH (ref 24–37)

## 2015-09-07 LAB — PROTIME-INR
INR: 1.68 — ABNORMAL HIGH (ref <1.50)
Prothrombin Time: 20 seconds — ABNORMAL HIGH (ref 11.6–15.2)

## 2015-09-07 LAB — BRAIN NATRIURETIC PEPTIDE: Brain Natriuretic Peptide: 495.4 pg/mL — ABNORMAL HIGH (ref 0.0–100.0)

## 2015-09-08 ENCOUNTER — Encounter (HOSPITAL_COMMUNITY)
Admission: RE | Disposition: A | Discharge status: Home / Self Care | Payer: Self-pay | Source: Ambulatory Visit | Attending: Cardiovascular Disease

## 2015-09-08 ENCOUNTER — Encounter (HOSPITAL_COMMUNITY): Payer: Self-pay

## 2015-09-08 ENCOUNTER — Ambulatory Visit (HOSPITAL_COMMUNITY)
Admission: RE | Admit: 2015-09-08 | Discharge: 2015-09-08 | Disposition: A | Discharge status: Home / Self Care | Payer: Medicare Other | Source: Ambulatory Visit | Attending: Cardiovascular Disease | Admitting: Cardiovascular Disease

## 2015-09-08 ENCOUNTER — Ambulatory Visit (HOSPITAL_COMMUNITY): Payer: Medicare Other | Admitting: Certified Registered Nurse Anesthetist

## 2015-09-08 ENCOUNTER — Ambulatory Visit (HOSPITAL_BASED_OUTPATIENT_CLINIC_OR_DEPARTMENT_OTHER): Payer: Medicare Other

## 2015-09-08 DIAGNOSIS — I491 Atrial premature depolarization: Secondary | ICD-10-CM | POA: Diagnosis not present

## 2015-09-08 DIAGNOSIS — Z951 Presence of aortocoronary bypass graft: Secondary | ICD-10-CM | POA: Insufficient documentation

## 2015-09-08 DIAGNOSIS — Z7901 Long term (current) use of anticoagulants: Secondary | ICD-10-CM | POA: Insufficient documentation

## 2015-09-08 DIAGNOSIS — I10 Essential (primary) hypertension: Secondary | ICD-10-CM | POA: Insufficient documentation

## 2015-09-08 DIAGNOSIS — I739 Peripheral vascular disease, unspecified: Secondary | ICD-10-CM | POA: Diagnosis not present

## 2015-09-08 DIAGNOSIS — I2510 Atherosclerotic heart disease of native coronary artery without angina pectoris: Secondary | ICD-10-CM | POA: Insufficient documentation

## 2015-09-08 DIAGNOSIS — E785 Hyperlipidemia, unspecified: Secondary | ICD-10-CM | POA: Insufficient documentation

## 2015-09-08 DIAGNOSIS — Z79899 Other long term (current) drug therapy: Secondary | ICD-10-CM | POA: Insufficient documentation

## 2015-09-08 DIAGNOSIS — I4891 Unspecified atrial fibrillation: Secondary | ICD-10-CM

## 2015-09-08 DIAGNOSIS — Z87891 Personal history of nicotine dependence: Secondary | ICD-10-CM | POA: Diagnosis not present

## 2015-09-08 DIAGNOSIS — I4892 Unspecified atrial flutter: Secondary | ICD-10-CM | POA: Diagnosis not present

## 2015-09-08 DIAGNOSIS — J449 Chronic obstructive pulmonary disease, unspecified: Secondary | ICD-10-CM | POA: Insufficient documentation

## 2015-09-08 DIAGNOSIS — R001 Bradycardia, unspecified: Secondary | ICD-10-CM | POA: Diagnosis not present

## 2015-09-08 HISTORY — PX: TEE WITHOUT CARDIOVERSION: SHX5443

## 2015-09-08 HISTORY — PX: CARDIOVERSION: SHX1299

## 2015-09-08 SURGERY — CARDIOVERSION
Anesthesia: Monitor Anesthesia Care

## 2015-09-08 MED ORDER — METOPROLOL TARTRATE 25 MG PO TABS: 25.0000 mg | ORAL_TABLET | Freq: Two times a day (BID) | ORAL | Status: DC

## 2015-09-08 MED ORDER — PERFLUTREN LIPID MICROSPHERE
INTRAVENOUS | Status: DC | PRN
  Administered 2015-09-08: 2 mL via INTRAVENOUS

## 2015-09-08 MED ORDER — PROPOFOL 10 MG/ML IV BOLUS
INTRAVENOUS | Status: DC | PRN
  Administered 2015-09-08: 10 mg via INTRAVENOUS
  Administered 2015-09-08 (×2): 20 mg via INTRAVENOUS

## 2015-09-08 MED ORDER — PERFLUTREN LIPID MICROSPHERE
INTRAVENOUS | Status: AC
  Filled 2015-09-08: qty 10

## 2015-09-08 MED ORDER — PROPOFOL 500 MG/50ML IV EMUL
INTRAVENOUS | Status: DC | PRN
  Administered 2015-09-08: 100 ug/kg/min via INTRAVENOUS

## 2015-09-08 MED ORDER — SODIUM CHLORIDE 0.9 % IV SOLN
INTRAVENOUS | Status: DC
  Administered 2015-09-08 (×2): via INTRAVENOUS

## 2015-09-08 NOTE — H&P (View-Only) (Signed)
CARDIOLOGY OFFICE NOTE  Date:  09/06/2015    [name] Date of Birth: Feb 04, 1946 Medical Record [id number]  PCP:  Mickie Hillier, MD  Cardiologist:  [facility]    Chief Complaint  Patient presents with  . Shortness of Breath    Work in visit - seen for Dr. Marlou Porch  . Atrial Flutter    History of Present Illness: ARVILLE HEXT is a 70 y.o. male who presents today for a work in visit. Seen for Dr. Marlou Porch.   He has known CAD status post CABG in 2008 by Dr. Darcey Nora. He had postop atrial fibrillation but had had no recurrence until he presented on 07/29/15 complaining of dizziness and near syncope. Was found to be in atrial flutter and was placed on Xarelto and set up for TEE guided cardioversion.CHADSVASC=3. TEE showed questionable clot in the left atrium so cardioversion was canceled. Lopressor was increased due to elevated HR. It should be noted he had bradycardia at the rate of 45 two years ago and had metoprolol stopped. Other issues include HTN, HLD, COPD & carotid disease.  Saw Ermalinda Barrios, PA in mid December - complained of dyspnea. Did not appear to be volume overloaded. Holter was arranged. Was to see Dr. Marlou Porch back in mid January to arrange another attempt at cardioversion.   Phone call today - "C/O SOB worsening x 3 weeks, pt feels it may have to do with the increase of his metoprolol 3 weeks ago. He was scheduled for a cardioversion 08/02/15 that was cancelled, he was placed on Xarelto and is waiting for reschedule. Pt states he can not lay down to sleep has to sleep in chair or propped on several pillows.  Denies edema in legs/ abdomen. Pt does have a bit of congestion and cough.  Pt states he is having times when his breathing is in a panting pattern. When he sits upright it will stop.  Current bp 114/84 p 78. I will discuss with Dr Marlou Porch for further advice. Pt to be seen today/ placed on Truitt Merle NP schedule."  Thus added to my schedule for today.   Comes in  today. Here with his wife. He remains short of breath with any exertion. Can't lie down. Sleeping upright in the recliner. As long as he is upright he is "ok". When he lies back - he is short of breath. Has had persistent cough - some white sputum. No chest pain. Not really dizzy. No syncope. Weight is up just a few pounds.  Past Medical History  Diagnosis Date  . Hypertension   . CAD (coronary artery disease)   . Headache(784.0)   . COPD (chronic obstructive pulmonary disease) ([place])   . Gastritis   . Bilateral carotid artery disease ([address]) 07/21/2013    Past Surgical History  Procedure Laterality Date  . Coronary artery bypass graft    . Incision and drainage deep neck abscess    . Tee without cardioversion N/A 08/02/2015    Procedure: TRANSESOPHAGEAL ECHOCARDIOGRAM (TEE);  Surgeon: Sueanne Margarita, MD;  Location: [facility] ENDOSCOPY;  Service: Cardiovascular;  Laterality: N/A;     Medications: Current Outpatient Prescriptions  Medication Sig Dispense Refill  . acetaminophen (TYLENOL) 500 MG tablet Take 500 mg by mouth every 4 (four) hours as needed.    . ALPRAZolam (XANAX) 0.25 MG tablet TAKE ONE-HALF TO ONE TABLET BY MOUTH TWICE DAILY AS NEEDED (MUST  LAST  ONE  MONTH) 30 tablet 5  . atorvastatin (LIPITOR) 40  MG tablet Take 1 tablet (40 mg total) by mouth daily. 90 tablet 1  . dextromethorphan-guaiFENesin (MUCINEX DM) 30-600 MG 12hr tablet Take 1 tablet by mouth 2 (two) times daily as needed for cough.    [name] lisinopril (PRINIVIL,ZESTRIL) 20 MG tablet Take 1 tablet (20 mg total) by mouth 2 (two) times daily. 180 tablet 1  . metoprolol tartrate (LOPRESSOR) 25 MG tablet Take 2 tablets (50 mg total) by mouth 2 (two) times daily. 60 tablet 6  . Multiple Vitamins-Minerals (MULTIVITAMIN WITH MINERALS) tablet Take 1 tablet by mouth daily.    . nitroGLYCERIN (NITROSTAT) 0.4 MG SL tablet Place 1 tablet (0.4 mg total) under the tongue every 5 (five) minutes as needed for chest pain. 25 tablet 6  .  Omega-3 Fatty Acids (FISH OIL) 1200 MG CAPS Take 1 capsule by mouth daily.     . rivaroxaban (XARELTO) 20 MG TABS tablet Take 1 tablet (20 mg total) by mouth daily with supper. 30 tablet 6  . vitamin C (ASCORBIC ACID) 500 MG tablet Take 500 mg by mouth daily.    . furosemide (LASIX) 20 MG tablet Take 1 tablet (20 mg total) by mouth daily. 10 tablet 6   No current facility-administered medications for this visit.    Allergies: Allergies  Allergen Reactions  . Doxycycline Shortness Of Breath    Also has to urinate more often  . Nsaids     Gastritis     Social History: The patient  reports that he has quit smoking. He does not have any smokeless tobacco history on file.   Family History: The patient's family history includes Diabetes in his mother and sister; Heart attack in his father; Hypertension in his mother and sister; Sudden death in his father.   Review of Systems: Please see the history of present illness.   Otherwise, the review of systems is positive for none.   All other systems are reviewed and negative.   Physical Exam: VS:  BP 138/80 mmHg  Pulse 96  Ht 5\' 5"  (1.651 m)  Wt 151 lb 12.8 oz (68.856 kg)  BMI 25.26 kg/m2  SpO2 97% .  BMI Body mass index is 25.26 kg/(m^2).  Wt Readings from Last 3 Encounters:  09/06/15 151 lb 12.8 oz (68.856 kg)  08/16/15 149 lb 6.4 oz (67.767 kg)  08/05/15 148 lb 8 oz (67.359 kg)    General: Pleasant. He looks chronically ill but in no acute distress. When he laid back on the exam table - oxygen sat dropped to 83%. Increases to 97% when upright.  HEENT: Normal. Neck: Supple, no JVD, carotid bruits, or masses noted.  Cardiac: Irregular rhythm. Rate fair. Heart tones are distant. No edema.  Respiratory:  Lungs are fairly clear and with normal work of breathing at rest.  GI: Soft and nontender.  MS: No deformity or atrophy. Gait and ROM intact. Skin: Warm and dry. Color is normal.  Neuro:  Strength and sensation are intact and no  gross focal deficits noted.  Psych: Alert, appropriate and with normal affect.   LABORATORY DATA:  EKG:  EKG is ordered today. This demonstrates atrial fib with a ventricular response of 96. Diffuse T wave changes noted. Reviewed with Dr. Marlou Porch.   Lab Results  Component Value Date   WBC 8.5 07/29/2015   HGB 13.7 07/29/2015   HCT 41.5 07/29/2015   PLT 226 07/29/2015   GLUCOSE 79 07/29/2015   CHOL 113 08/11/2015   TRIG 59 08/11/2015   HDL  54 08/11/2015   LDLCALC 47 08/11/2015   ALT 81* 08/11/2015   AST 36 08/11/2015   NA 141 07/29/2015   K 4.2 07/29/2015   CL 105 07/29/2015   CREATININE 1.23* 07/29/2015   BUN 18 07/29/2015   CO2 27 07/29/2015   TSH 0.709 Test methodology is 3rd generation TSH 03/09/2007   PSA 0.8 01/08/2015   INR 1.3 03/06/2007   HGBA1C  03/05/2007    5.8 (NOTE)   The ADA recommends the following therapeutic goals for glycemic   control related to Hgb A1C measurement:   Goal of Therapy:   < 7.0% Hgb A1C   Action Suggested:  > 8.0% Hgb A1C   Ref:  Diabetes Care, 22, Suppl. 1, 1999    BNP (last 3 results) No results for input(s): BNP in the last 8760 hours.  ProBNP (last 3 results) No results for input(s): PROBNP in the last 8760 hours.   Other Studies Reviewed Today:  TEE FROM 08/02/2015:  Results: Left ventricle: There was severe concentric hypertrophy. Systolic function was normal. The estimated ejection fraction was in the range of 55% to 60%. Wall motion was normal; there were no regional wall motion abnormalities. - Aortic valve: There are thin mobile filamentous densities noted on the aortic side of the AV most consistent with Lamble&'s excrescences. These appear to intermittently prolapse into the LVOT. There was trivial regurgitation. - Aorta: Mildly dilated aortic root at sinuses of Valsalva measuring 4 cm. - Mitral valve: No evidence of vegetation. There was mild regurgitation. - Left atrium: There is no evidence of  thrombus in the LA appendage. The LA is dilated with significant spontaneous echo contrast near the MV inflow tract consistent with sluggish flow. There is significant swirling in this area with possible early forming thrombus. There is a mobile bright very small target that is seen in this area intermittently of unknown significance. Cannot discern whether this is a torn chordae tendinae or small thrombus. Would expect more mitral regurgitation if this were a ruptured chord. Would consider this a possible thrombus in setting of significant smoke. No evidence of thrombus in the atrial cavity or appendage. - Right atrium: No evidence of thrombus in the atrial cavity or appendage. - Atrial septum: There was increased thickness of the septum, consistent with lipomatous hypertrophy. - Tricuspid valve: There was trivial regurgitation. - Pulmonic valve: No evidence of vegetation. There was trivial regurgitation.  Due to concern for early forming thrombus in the LA, cardioversion was cancelled. Patient's heart rate was mildly increased so he was instructed to increase Lopressor to 50mg  BID and he will be seen by our extender in the office on Friday.   Carotid dopplers: 07/27/15: Notes Recorded by Jerline Pain, MD on 07/28/2015 at 6:15 AM Heterogeneous plaque, bilaterally.  Stable 60-79% RICA stenosis.  Stable 123456 LICA stenosis.  Essentially stable >50% stenosis in the bilateral ECA's.  Normal subclavian arteries, bilaterally.  Patent vertebral arteries with antegrade flow.  F/U 1 year   Holter Study Highlights from 07/2015    Atrial flutter Rats 57 to 165 bpm Average HR 105 bpm  Frequent PVCs , couplet. 1 7 beat run NSVT. No pauses       ASSESSMENT AND PLAN:  1. Atrial flutter (HCC)/atrial fib - only with fair rate control. I worry that he has tachybrady syndrome. PPM implant may be needed and this was discussed briefly. Will proceed on  with repeat attempt at TEE/cardioversion - he has been on his Xarelto for  over one month since last attempt on 08/02/2015 - no missed doses reported. The procedure has been discussed in detail and he is willing to proceed. We will check labs today, send for CXR as well and I am starting him on Lasix 20 mg - to take one tablet this evening and repeat again in the AM. I suspect he has some degree of loss of atrial kick given the AF/diastolic HF.   2. Dyspnea - most likely multifactorial from COPD/diastolic dysfunction  3. CAD (coronary artery disease) of artery bypass graft - no reports of chest pain  4. Essential hypertension, benign - BP is ok on current regimen.  5. COPD - not smoking  6. Carotid disease - repeat study 06/2016   Current medicines are reviewed with the patient today.  The patient does not have concerns regarding medicines other than what has been noted above.  The following changes have been made:  See above.  Labs/ tests ordered today include:    Orders Placed This Encounter  Procedures  . DG Chest 2 View  . Basic metabolic panel  . Brain natriuretic peptide  . CBC  . Protime-INR  . APTT  . EKG 12-Lead     Disposition:   FU with Dr. Marlou Porch as planned later this week unless deemed otherwise.   Patient is agreeable to this plan and will call if any problems develop in the interim.   Signed: Burtis Junes, RN, ANP-C 09/06/2015 3:07 PM  [facility] [address] [place], [place]  [address] Phone: [phone] Fax: [phone]

## 2015-09-08 NOTE — Anesthesia Postprocedure Evaluation (Signed)
Anesthesia Post Note  Patient: [name]  Procedure(s) Performed: Procedure(s) (LRB): CARDIOVERSION (N/A) TRANSESOPHAGEAL ECHOCARDIOGRAM (TEE) (N/A)  Patient location during evaluation: Endoscopy Anesthesia Type: MAC Level of consciousness: awake and alert Pain management: pain level controlled Vital Signs Assessment: post-procedure vital signs reviewed and stable Respiratory status: spontaneous breathing and patient connected to nasal cannula oxygen Cardiovascular status: blood pressure returned to baseline and stable Postop Assessment: no headache and no signs of nausea or vomiting Anesthetic complications: no    Last Vitals:  Filed Vitals:   09/08/15 1251 09/08/15 1433  BP: 129/79   Pulse: 74 96  Resp: 13 16    Last Pain: There were no vitals filed for this visit.               Maryland Pink

## 2015-09-08 NOTE — Anesthesia Procedure Notes (Signed)
Procedure Name: MAC Date/Time: 09/08/2015 1:50 PM Performed by: Maryland Pink Pre-anesthesia Checklist: Patient identified, Emergency Drugs available, Suction available, Patient being monitored and Timeout performed Patient Re-evaluated:Patient Re-evaluated prior to inductionOxygen Delivery Method: Nasal cannula Preoxygenation: Pre-oxygenation with 100% oxygen Placement Confirmation: positive ETCO2 Dental Injury: Teeth and Oropharynx as per pre-operative assessment

## 2015-09-08 NOTE — Anesthesia Preprocedure Evaluation (Addendum)
Anesthesia Evaluation  Patient identified by MRN, date of birth, ID band Patient awake    Reviewed: Allergy & Precautions, H&P , NPO status , Patient's Chart, lab work & pertinent test results, reviewed documented beta blocker date and time   Airway Mallampati: II  TM Distance: >3 FB Neck ROM: Full    Dental no notable dental hx. (+) Teeth Intact, Dental Advisory Given   Pulmonary COPD, former smoker,    Pulmonary exam normal breath sounds clear to auscultation       Cardiovascular hypertension, Pt. on medications and Pt. on home beta blockers + CAD, + CABG and + Peripheral Vascular Disease  + dysrhythmias Atrial Fibrillation  Rhythm:Irregular Rate:Normal     Neuro/Psych  Headaches, negative psych ROS   GI/Hepatic negative GI ROS, Neg liver ROS,   Endo/Other  negative endocrine ROS  Renal/GU negative Renal ROS  negative genitourinary   Musculoskeletal   Abdominal   Peds  Hematology negative hematology ROS (+)   Anesthesia Other Findings   Reproductive/Obstetrics negative OB ROS                           Anesthesia Physical Anesthesia Plan  ASA: III  Anesthesia Plan: MAC   Post-op Pain Management:    Induction: Intravenous  Airway Management Planned: Nasal Cannula  Additional Equipment:   Intra-op Plan:   Post-operative Plan:   Informed Consent: I have reviewed the patients History and Physical, chart, labs and discussed the procedure including the risks, benefits and alternatives for the proposed anesthesia with the patient or authorized representative who has indicated his/her understanding and acceptance.   Dental advisory given  Plan Discussed with: CRNA  Anesthesia Plan Comments:         Anesthesia Quick Evaluation

## 2015-09-08 NOTE — Discharge Instructions (Signed)
Transesophageal Echocardiogram °Transesophageal echocardiography (TEE) is a picture test of your heart using sound waves. The pictures taken can give very detailed pictures of your heart. This can help your doctor see if there are problems with your heart. TEE can check: °· If your heart has blood clots in it. °· How well your heart valves are working. °· If you have an infection on the inside of your heart. °· Some of the major arteries of your heart. °· If your heart valve is working after a repair. °· Your heart before a procedure that uses a shock to your heart to get the rhythm back to normal. °BEFORE THE PROCEDURE °· Do not eat or drink for 6 hours before the procedure or as told by your doctor. °· Make plans to have someone drive you home after the procedure. Do not drive yourself home. °· An IV tube will be put in your arm. °PROCEDURE °· You will be given a medicine to help you relax (sedative). It will be given through the IV tube. °· A numbing medicine will be sprayed or gargled in the back of your throat to help numb it. °· The tip of the probe is placed into the back of your mouth. You will be asked to swallow. This helps to pass the probe into your esophagus. °· Once the tip of the probe is in the right place, your doctor can take pictures of your heart. °· You may feel pressure at the back of your throat. °AFTER THE PROCEDURE °· You will be taken to a recovery area so the sedative can wear off. °· Your throat may be sore and scratchy. This will go away slowly over time. °· You will go home when you are fully awake and able to swallow liquids. °· You should have someone stay with you for the next 24 hours. °· Do not drive or operate machinery for the next 24 hours. °  °This information is not intended to replace advice given to you by your health care provider. Make sure you discuss any questions you have with your health care provider. °  °Document Released: 06/11/2009 Document Revised: 08/19/2013  Document Reviewed: 02/13/2013 °Elsevier Interactive Patient Education ©2016 [employer] Inc[employer] °Electrical Cardioversion, Care After °Refer to this sheet in the next few weeks. These instructions provide you with information on caring for yourself after your procedure. Your health care provider may also give you more specific instructions. Your treatment has been planned according to current medical practices, but problems sometimes occur. Call your health care provider if you have any problems or questions after your procedure. °WHAT TO EXPECT AFTER THE PROCEDURE °After your procedure, it is typical to have the following sensations: °· Some redness on the skin where the shocks were delivered. If this is tender, a sunburn lotion or hydrocortisone cream may help. °· Possible return of an abnormal heart rhythm within hours or days after the procedure. °HOME CARE INSTRUCTIONS °· Take medicines only as directed by your health care provider. Be sure you understand how and when to take your medicine. °· Learn how to feel your pulse and check it often. °· Limit your activity for 48 hours after the procedure or as directed by your health care provider. °· Avoid or minimize caffeine and other stimulants as directed by your health care provider. °SEEK MEDICAL CARE IF: °· You feel like your heart is beating too fast or your pulse is not regular. °· You have any questions about your medicines. °·   You have bleeding that will not stop. °SEEK IMMEDIATE MEDICAL CARE IF: °· You are dizzy or feel faint. °· It is hard to breathe or you feel short of breath. °· There is a change in discomfort in your chest. °· Your speech is slurred or you have trouble moving an arm or leg on one side of your body. °· You get a serious muscle cramp that does not go away. °· Your fingers or toes turn cold or blue. °  °This information is not intended to replace advice given to you by your health care provider. Make sure you discuss any questions you have  with your health care provider. °  °Document Released: 06/04/2013 Document Revised: 09/04/2014 Document Reviewed: 06/04/2013 °Elsevier Interactive Patient Education ©2016 Elsevier Inc. ° °

## 2015-09-08 NOTE — CV Procedure (Signed)
    Transesophageal Echocardiogram Note  [name] [id number] 1946-03-26  Procedure: Transesophageal Echocardiogram Indications: atrial fib  Procedure Details Consent: Obtained Time Out: Verified patient identification, verified procedure, site/side was marked, verified correct patient position, special equipment/implants available, Radiology Safety Procedures followed,  medications/allergies/relevent history reviewed, required imaging and test results available.  Performed  Medications: Propofol 250 mg total for TEE and cardioversion   Left Ventrical:  Mild LV dysfunction   Mitral Valve: normal MV  Aortic Valve: normal , trace AI  Tricuspid Valve: normal   Pulmonic Valve: normal   Left Atrium/ Left atrial appendage: no thrombi.   We used Definity to help visualize the LAA.  No thrombi were seen   Atrial septum: no ASD or PFO by color doppler   Aorta: not visualized    Complications: No apparent complications Patient did tolerate procedure well.      Cardioversion Note  [name] [id number] 1945/12/26  Procedure: DC Cardioversion Indications: atrial fib   Procedure Details Consent: Obtained Time Out: Verified patient identification, verified procedure, site/side was marked, verified correct patient position, special equipment/implants available, Radiology Safety Procedures followed,  medications/allergies/relevent history reviewed, required imaging and test results available.  Performed  The patient has been on adequate anticoagulation.  The patient received IV Propofol ( see above )  for sedation.  Synchronous cardioversion was performed at 120  joules.  The cardioversion was successful.   It took about 5 seconds from cardioversion to the resumption of sinus rhythm. He is now in sinus brady     Complications: No apparent complications Patient did tolerate procedure well.   Thayer Headings, Brooke Bonito., MD, [facility] 09/08/2015, 2:21 PM

## 2015-09-08 NOTE — Progress Notes (Signed)
    Called back to see patient after the TEE / CArdioversion He had complained of double vision / blurry vision following the procedure   On exam,  His CN II-XII are completely intact. Vision is better  No double vision at present  Gross motor is normal - moves arms / legs , strength is normal  At this time, I see no evidence that he has had a CVA.  Perhaps the blurry vision is due to the residual effects of the Propofol.  Will assess in about 30 minutes.      Nahser, Wonda Cheng, MD  09/08/2015 2:50 PM    [facility],  [place], [place]  [address] Pager [phone] Phone: [phone]; Fax: [phone]   [facility]  [address] [place], [place]  [address] [phone]   Fax [phone]

## 2015-09-08 NOTE — Transfer of Care (Signed)
Immediate Anesthesia Transfer of Care Note  Patient: [name]  Procedure(s) Performed: Procedure(s): CARDIOVERSION (N/A) TRANSESOPHAGEAL ECHOCARDIOGRAM (TEE) (N/A)  Patient Location: Endoscopy Unit  Anesthesia Type:MAC  Level of Consciousness: awake, alert  and oriented  Airway & Oxygen Therapy: Patient Spontanous Breathing and Patient connected to nasal cannula oxygen  Post-op Assessment: Report given to RN and Post -op Vital signs reviewed and stable  Post vital signs: Reviewed and stable  Last Vitals:  Filed Vitals:   09/08/15 1251 09/08/15 1433  BP: 129/79   Pulse: 74 96  Resp: 13 16    Complications: No apparent anesthesia complications

## 2015-09-08 NOTE — Progress Notes (Signed)
*  PRELIMINARY RESULTS* Echocardiogram TEE has been performed.  [name] 09/08/2015, 2:38 PM

## 2015-09-08 NOTE — Interval H&P Note (Signed)
History and Physical Interval Note:  09/08/2015 1:33 PM  [name]  has presented today for surgery, with the diagnosis of aflutter  The various methods of treatment have been discussed with the patient and family. After consideration of risks, benefits and other options for treatment, the patient has consented to  Procedure(s): CARDIOVERSION (N/A) TRANSESOPHAGEAL ECHOCARDIOGRAM (TEE) (N/A) as a surgical intervention .  The patient's history has been reviewed, patient examined, no change in status, stable for surgery.  I have reviewed the patient's chart and labs.  Questions were answered to the patient's satisfaction.     Salman Wellen, Wonda Cheng

## 2015-09-09 ENCOUNTER — Encounter (HOSPITAL_COMMUNITY): Payer: Self-pay | Admitting: Cardiovascular Disease

## 2015-09-10 ENCOUNTER — Encounter: Payer: Self-pay | Admitting: Cardiology

## 2015-09-10 ENCOUNTER — Ambulatory Visit (INDEPENDENT_AMBULATORY_CARE_PROVIDER_SITE_OTHER): Payer: Medicare Other | Admitting: Cardiology

## 2015-09-10 VITALS — BP 120/80 | HR 56 | Ht 65.0 in | Wt 148.4 lb

## 2015-09-10 DIAGNOSIS — J438 Other emphysema: Secondary | ICD-10-CM | POA: Diagnosis not present

## 2015-09-10 DIAGNOSIS — I25708 Atherosclerosis of coronary artery bypass graft(s), unspecified, with other forms of angina pectoris: Secondary | ICD-10-CM

## 2015-09-10 DIAGNOSIS — E785 Hyperlipidemia, unspecified: Secondary | ICD-10-CM

## 2015-09-10 DIAGNOSIS — I4892 Unspecified atrial flutter: Secondary | ICD-10-CM | POA: Diagnosis not present

## 2015-09-10 DIAGNOSIS — I10 Essential (primary) hypertension: Secondary | ICD-10-CM

## 2015-09-10 DIAGNOSIS — I5023 Acute on chronic systolic (congestive) heart failure: Secondary | ICD-10-CM

## 2015-09-10 MED ORDER — FUROSEMIDE 20 MG PO TABS: 20.0000 mg | ORAL_TABLET | Freq: Every day | ORAL | Status: DC

## 2015-09-10 NOTE — Patient Instructions (Addendum)
Medication Instructions:  Please restart Furosemide 20 mg a day. The current medical regimen is effective;  continue present plan and medications.  Follow-Up: Follow up in 4 weeks with Truitt Merle, NP.  If you need a refill on your cardiac medications before your next appointment, please call your pharmacy.  Thank you for choosing [facility]!!

## 2015-09-10 NOTE — Progress Notes (Signed)
[facility] [address] [place], [place]  [address] Phone: [phone] Fax:  [phone]  Date:  09/10/2015   ID:  Ian, Moyer 1946/03/09, MRN [id number]  PCP:  Ian Hillier, MD   History of Present Illness: [name] is a 70 y.o. male here for post TEE cardioversion follow-up with CAD status post CABG in 2008.  COPD, tobacco use as well as carotid artery disease. Hyperlipidemia treated with pravastatin.   Original TEE cardioversion was canceled because a possible LA thrombus. Repeat looked okay. Successful cardioversion. EF remains 30-35%.  Chest x-ray showed COPD, small effusions. BNP was 495 mildly elevated.  After cardioversion, he was in the bathroom overnight quite a bit. He is still however feeling some orthopnea.  I have asked him to continue his Lasix 20mg   Stress test February of 2012 was low risk with no ischemia. Reassuring. This was prior to rotator cuff surgery.  Carotid artery duplex Q000111Q RICA [id number], LICA now Q000111Q mildly progressed.     Wt Readings from Last 3 Encounters:  09/10/15 148 lb 6.4 oz (67.314 kg)  09/08/15 150 lb (68.04 kg)  09/06/15 151 lb 12.8 oz (68.856 kg)     Past Medical History  Diagnosis Date  . Hypertension   . CAD (coronary artery disease)   . Headache(784.0)   . COPD (chronic obstructive pulmonary disease) ([place])   . Gastritis   . Bilateral carotid artery disease ([place]) 07/21/2013    Past Surgical History  Procedure Laterality Date  . Coronary artery bypass graft    . Incision and drainage deep neck abscess    . Tee without cardioversion N/A 08/02/2015    Procedure: TRANSESOPHAGEAL ECHOCARDIOGRAM (TEE);  Surgeon: Ian Margarita, MD;  Location: [facility];  Service: Cardiovascular;  Laterality: N/A;  . Cardioversion N/A 09/08/2015    Procedure: CARDIOVERSION;  Surgeon: Ian Headings, MD;  Location: [facility] ENDOSCOPY;  Service: Cardiovascular;  Laterality: N/A;  . Tee without cardioversion N/A 09/08/2015     Procedure: TRANSESOPHAGEAL ECHOCARDIOGRAM (TEE);  Surgeon: Ian Headings, MD;  Location: [facility] ENDOSCOPY;  Service: Cardiovascular;  Laterality: N/A;    Current Outpatient Prescriptions  Medication Sig Dispense Refill  . acetaminophen (TYLENOL) 500 MG tablet Take 1,000 mg by mouth daily as needed for headache.     . ALPRAZolam (XANAX) 0.25 MG tablet Take 0.25 mg by mouth at bedtime as needed for anxiety (Take 1/2-1 tablet twice a day as needed).    [name] atorvastatin (LIPITOR) 40 MG tablet Take 40 mg by mouth daily at 6 PM.    . dextromethorphan-guaiFENesin (MUCINEX DM) 30-600 MG 12hr tablet Take 1 tablet by mouth 2 (two) times daily as needed for cough. Reported on 09/07/2015    . lisinopril (PRINIVIL,ZESTRIL) 20 MG tablet Take 1 tablet (20 mg total) by mouth 2 (two) times daily. 180 tablet 1  . metoprolol tartrate (LOPRESSOR) 25 MG tablet Take 1 tablet (25 mg total) by mouth 2 (two) times daily. 60 tablet 6  . Multiple Vitamins-Minerals (MULTIVITAMIN WITH MINERALS) tablet Take 1 tablet by mouth daily.    . nitroGLYCERIN (NITROSTAT) 0.4 MG SL tablet Place 1 tablet (0.4 mg total) under the tongue every 5 (five) minutes as needed for chest pain. 25 tablet 6  . Omega-3 Fatty Acids (FISH OIL) 1200 MG CAPS Take 1 capsule by mouth daily.     . rivaroxaban (XARELTO) 20 MG TABS tablet Take 1 tablet (20 mg total) by mouth daily with  supper. 30 tablet 6  . vitamin C (ASCORBIC ACID) 500 MG tablet Take 500 mg by mouth daily.     No current facility-administered medications for this visit.    Allergies:    Allergies  Allergen Reactions  . Doxycycline Shortness Of Breath    Also has to urinate more often  . Nsaids     Gastritis     Social History:  The patient  reports that he has quit smoking. He does not have any smokeless tobacco history on file.   ROS:  Please see the history of present illness.   Thumb No syncope, no bleeding, no orthopnea, no PND   PHYSICAL EXAM: VS:  BP 120/80 mmHg  Pulse  56  Ht 5\' 5"  (1.651 m)  Wt 148 lb 6.4 oz (67.314 kg)  BMI 24.70 kg/m2  SpO2 96% Well nourished, well developed, in no acute distress HEENT: normal Neck: no JVDsoft bilateral bruits Cardiac:  normal S1, S2; Tachy REG; no murmur Lungs:  clear to auscultation bilaterally, no wheezing, rhonchi or rales Abd: soft, nontender, no hepatomegaly Ext: no edema Skin: warm and dry Neuro: no focal abnormalities noted  EKG: Today - 09/10/15-sinus bradycardia with premature supraventricular complexes, PA-C, 57, T-wave inversion inferiorly as well as precordial leads lateral. Nonspecific. Personally viewed-prior 07/24/14-sinus rhythm, 62, nonspecific ST-T wave changes, ounce living ST segment consider inferior/anterolateral ischemia.prior NSR, 62, NSSTW changes.     TEE 09/08/15:  - Ejection fraction 35-40%  - No obvious thrombus  Stress test February of 2012 was low risk with no ischemia. Reassuring. This was prior to rotator cuff surgery.  Carotid artery duplex 07/27/15 Heterogeneous plaque, bilaterally. Stable 60-79% RICA stenosis. Stable 123456 LICA stenosis. Essentially stable >50% stenosis in the bilateral ECA's. Normal subclavian arteries, bilaterally. Patent vertebral arteries with antegrade flow. F/U 1 year  ASSESSMENT AND PLAN:  Atrial flutter- 2:1  - NOAC, Xarelto 20 mg once a day  - Successful TEE cardioversion on 09/08/15  - EF 35-40%  - Metoprolol 25 mg twice a day, heart rate 57.  - Postconversion diuresis noted.  Acute on chronic systolic heart failure  - Orthopnea mildly improved but not perfect  - Continue with Lasix 20 mg once a day  - Chest x-ray personally viewed with minimal pleural effusion bilaterally, COPD noted  COPD  - Noted on chest x-ray  - Smoking history  CAD post bypass  - 2008  - Nuclear stress test 2012 reassuring   Carotid artery disease  -stable. Continuing to monitor every 12 months. Asymptomatic, Statin. 79%.  Chronic anticoagulation-  CHADS-VASc 3  Hypertension-currently reasonable controlled. Continue to monitor closely.   Hyperlipidemia-continue with atorvastatin. LDL <70. Dr. Wolfgang Phoenix following. Looks good  Arthritis-encouraged him to discuss this with Dr. Wolfgang Phoenix  Follow-up with Cecille Rubin in 4 weeks    Signed, Candee Furbish, MD [facility]  09/10/2015 11:15 AM

## 2015-10-13 ENCOUNTER — Ambulatory Visit (INDEPENDENT_AMBULATORY_CARE_PROVIDER_SITE_OTHER): Payer: Medicare Other | Admitting: Nurse Practitioner

## 2015-10-13 ENCOUNTER — Encounter: Payer: Self-pay | Admitting: Nurse Practitioner

## 2015-10-13 ENCOUNTER — Telehealth: Payer: Self-pay | Admitting: Nurse Practitioner

## 2015-10-13 VITALS — BP 122/66 | HR 60 | Ht 65.0 in | Wt 148.4 lb

## 2015-10-13 DIAGNOSIS — I480 Paroxysmal atrial fibrillation: Secondary | ICD-10-CM

## 2015-10-13 DIAGNOSIS — I10 Essential (primary) hypertension: Secondary | ICD-10-CM

## 2015-10-13 DIAGNOSIS — R7989 Other specified abnormal findings of blood chemistry: Secondary | ICD-10-CM | POA: Diagnosis not present

## 2015-10-13 DIAGNOSIS — R799 Abnormal finding of blood chemistry, unspecified: Secondary | ICD-10-CM | POA: Diagnosis not present

## 2015-10-13 DIAGNOSIS — Z7901 Long term (current) use of anticoagulants: Secondary | ICD-10-CM

## 2015-10-13 DIAGNOSIS — R945 Abnormal results of liver function studies: Secondary | ICD-10-CM

## 2015-10-13 DIAGNOSIS — I4891 Unspecified atrial fibrillation: Secondary | ICD-10-CM | POA: Diagnosis not present

## 2015-10-13 DIAGNOSIS — I4892 Unspecified atrial flutter: Secondary | ICD-10-CM

## 2015-10-13 LAB — CBC
HCT: 38.8 % — ABNORMAL LOW (ref 39.0–52.0)
Hemoglobin: 12.7 g/dL — ABNORMAL LOW (ref 13.0–17.0)
MCH: 30.7 pg (ref 26.0–34.0)
MCHC: 32.7 g/dL (ref 30.0–36.0)
MCV: 93.7 fL (ref 78.0–100.0)
MPV: 9.8 fL (ref 8.6–12.4)
Platelets: 209 10*3/uL (ref 150–400)
RBC: 4.14 MIL/uL — ABNORMAL LOW (ref 4.22–5.81)
RDW: 13.8 % (ref 11.5–15.5)
WBC: 11 10*3/uL — ABNORMAL HIGH (ref 4.0–10.5)

## 2015-10-13 MED ORDER — METOPROLOL TARTRATE 25 MG PO TABS: 12.5000 mg | ORAL_TABLET | Freq: Two times a day (BID) | ORAL | Status: DC

## 2015-10-13 NOTE — Telephone Encounter (Signed)
S/w pt's wife, stated I would check bathroom downstairs to see if AVS was still down there.  Sent Lorenda Peck, clinical supervisor to check.  Was no copy of AVS in bathroom.  I went to [facility] and stated how could we change pt's MRN number stated needed to talk with medical records.  In the mean time Corene Cornea came back over to my desk stated Cathie Beams went to medical records and asked Corene Cornea if he checked with the [employer], Corene Cornea went back downstairs.  I went to medical records stated if someone lose information after they leave the office we are not responsible for that information.  I walked back to front desk as Corene Cornea was getting off the [employer] our front desk girl was sitting there.  Corene Cornea stated janitor did not see AVS and Amber stated what are you talking about, I explained to [employer] what happened she walked over to the shred bid and pulled out pt's AVS.  Stated some guy with a brief case came in and handed the pts AVS  To her.  I called pt's wife back and explained and stated would mail copy today. In the mail.  Pt's wife was still stating concern that this person that handed our office the pt's AVS could have written down information off of it.  I couldn't confirm or deny just tried to reassure.

## 2015-10-13 NOTE — Telephone Encounter (Signed)
New message     Patient accidentally left his AVS in the downstairs bathroom.

## 2015-10-13 NOTE — Progress Notes (Signed)
CARDIOLOGY OFFICE NOTE  Date:  10/13/2015    [name] Date of Birth: 12/23/1945 Medical Record [id number]  PCP:  Mickie Hillier, MD  Cardiologist:  [facility]    Chief Complaint  Patient presents with  . Atrial Fibrillation  . Coronary Artery Disease    Follow up visit - seen for Dr. Marlou Porch    History of Present Illness: [name] is a 70 y.o. male who presents today for a follow up visit. Seen for Dr. Marlou Porch.   He has known CAD status post CABG in 2008 by Dr. Darcey Nora. He had postop atrial fibrillation but had had no recurrence until he presented on 07/29/15 complaining of dizziness and near syncope. Was found to be in atrial flutter and was placed on Xarelto and set up for TEE guided cardioversion.CHADSVASC=3. TEE showed questionable clot in the left atrium so cardioversion was canceled. Lopressor was increased due to elevated HR. It should be noted he had bradycardia at the rate of 45 two years ago and had metoprolol stopped. Other issues include HTN, HLD, COPD & carotid disease.  I saw him last month with worsening dyspnea - referred on for TEE cardioversion. This was successful.   Saw Dr. Marlou Porch back just a few days later - was doing ok. Was in NSR with rate of 57 by EKG.  Comes back today. Here with his wife. Doing ok. Has had his 2nd bout of this URI that has been going around. Some cough. No chest pain. Breathing is ok. No swelling. He likes his salt. Not dizzy or lightheaded. No syncope.    Past Medical History  Diagnosis Date  . Hypertension   . CAD (coronary artery disease)   . Headache(784.0)   . COPD (chronic obstructive pulmonary disease) ([place])   . Gastritis   . Bilateral carotid artery disease ([place]) 07/21/2013    Past Surgical History  Procedure Laterality Date  . Coronary artery bypass graft    . Incision and drainage deep neck abscess    . Tee without cardioversion N/A 08/02/2015    Procedure: TRANSESOPHAGEAL ECHOCARDIOGRAM (TEE);  Surgeon: Sueanne Margarita, MD;  Location: [facility];  Service: Cardiovascular;  Laterality: N/A;  . Cardioversion N/A 09/08/2015    Procedure: CARDIOVERSION;  Surgeon: Thayer Headings, MD;  Location: [facility] ENDOSCOPY;  Service: Cardiovascular;  Laterality: N/A;  . Tee without cardioversion N/A 09/08/2015    Procedure: TRANSESOPHAGEAL ECHOCARDIOGRAM (TEE);  Surgeon: Thayer Headings, MD;  Location: [facility];  Service: Cardiovascular;  Laterality: N/A;     Medications: Current Outpatient Prescriptions  Medication Sig Dispense Refill  . acetaminophen (TYLENOL) 500 MG tablet Take 1,000 mg by mouth daily as needed for headache.     . ALPRAZolam (XANAX) 0.25 MG tablet Take 0.25 mg by mouth at bedtime as needed for anxiety (Take 1/2-1 tablet twice a day as needed).    [name] atorvastatin (LIPITOR) 40 MG tablet Take 40 mg by mouth daily at 6 PM.    . dextromethorphan-guaiFENesin (MUCINEX DM) 30-600 MG 12hr tablet Take 1 tablet by mouth 2 (two) times daily as needed for cough. Reported on 09/07/2015    . furosemide (LASIX) 20 MG tablet Take 1 tablet (20 mg total) by mouth daily. 30 tablet 6  . lisinopril (PRINIVIL,ZESTRIL) 20 MG tablet Take 1 tablet (20 mg total) by mouth 2 (two) times daily. 180 tablet 1  . metoprolol tartrate (LOPRESSOR) 25 MG tablet Take 1 tablet (25 mg total) by mouth 2 (two)  times daily. 60 tablet 6  . Multiple Vitamins-Minerals (MULTIVITAMIN WITH MINERALS) tablet Take 1 tablet by mouth daily.    . nitroGLYCERIN (NITROSTAT) 0.4 MG SL tablet Place 1 tablet (0.4 mg total) under the tongue every 5 (five) minutes as needed for chest pain. 25 tablet 6  . Omega-3 Fatty Acids (FISH OIL) 1200 MG CAPS Take 1 capsule by mouth daily.     . rivaroxaban (XARELTO) 20 MG TABS tablet Take 1 tablet (20 mg total) by mouth daily with supper. 30 tablet 6  . vitamin C (ASCORBIC ACID) 500 MG tablet Take 500 mg by mouth daily.     No current facility-administered medications for this visit.    Allergies: Allergies    Allergen Reactions  . Doxycycline Shortness Of Breath    Also has to urinate more often  . Nsaids     Gastritis     Social History: The patient  reports that he has quit smoking. He does not have any smokeless tobacco history on file.   Family History: The patient's family history includes Diabetes in his mother and sister; Heart attack in his father; Hypertension in his mother and sister; Sudden death in his father.   Review of Systems: Please see the history of present illness.   Otherwise, the review of systems is positive for recurrent URI.   All other systems are reviewed and negative.   Physical Exam: VS:  BP 122/66 mmHg  Pulse 60  Ht 5\' 5"  (1.651 m)  Wt 148 lb 6.4 oz (67.314 kg)  BMI 24.70 kg/m2 .  BMI Body mass index is 24.7 kg/(m^2).  Wt Readings from Last 3 Encounters:  10/13/15 148 lb 6.4 oz (67.314 kg)  09/10/15 148 lb 6.4 oz (67.314 kg)  09/08/15 150 lb (68.04 kg)    General: Pleasant. Well developed, well nourished and in no acute distress.  HEENT: Normal. Neck: Supple, no JVD, carotid bruits, or masses noted.  Cardiac: Regular rate and rhythm but slow. No murmurs, rubs, or gallops. No edema.  Respiratory:  Lungs are clear to auscultation bilaterally with normal work of breathing.  GI: Soft and nontender.  MS: No deformity or atrophy. Gait and ROM intact. Skin: Warm and dry. Color is normal.  Neuro:  Strength and sensation are intact and no gross focal deficits noted.  Psych: Alert, appropriate and with normal affect.   LABORATORY DATA:  EKG:  EKG is ordered today. This demonstrates sinus bradycardia - rate of 49.  Lab Results  Component Value Date   WBC 9.9 09/06/2015   HGB 13.0 09/06/2015   HCT 38.9* 09/06/2015   PLT 171 09/06/2015   GLUCOSE 95 09/06/2015   CHOL 113 08/11/2015   TRIG 59 08/11/2015   HDL 54 08/11/2015   LDLCALC 47 08/11/2015   ALT 81* 08/11/2015   AST 36 08/11/2015   NA 138 09/06/2015   K 4.2 09/06/2015   CL 104 09/06/2015    CREATININE 1.14 09/06/2015   BUN 17 09/06/2015   CO2 25 09/06/2015   TSH 0.709 Test methodology is 3rd generation TSH 03/09/2007   PSA 0.86 01/16/2014   INR 1.68* 09/06/2015   HGBA1C  03/05/2007    5.8 (NOTE)   The ADA recommends the following therapeutic goals for glycemic   control related to Hgb A1C measurement:   Goal of Therapy:   < 7.0% Hgb A1C   Action Suggested:  > 8.0% Hgb A1C   Ref:  Diabetes Care, 22, Suppl. 1, 1999  BNP (last 3 results) No results for input(s): BNP in the last 8760 hours.  ProBNP (last 3 results) No results for input(s): PROBNP in the last 8760 hours.   Other Studies Reviewed Today:   Assessment/Plan:  TEE Study Conclusions from 09/08/2015  - Left ventricle: Systolic function was moderately reduced. The estimated ejection fraction was in the range of 35% to 40%. - Aortic valve: No evidence of vegetation. - Left atrium: No evidence of thrombus in the atrial cavity or appendage.  Impressions:  - This was done as the initial evaluation of a TEE / Cardioversion. The subsequent cardioversion was successful The patient complained of blurry vision for several minutes after waking up from the procedure His neuro exam was intact at that time. his blurry vision resolved completely after about 30 minutes. no complicatons  Procedure: DC Cardioversion Indications: atrial fib   Procedure Details Consent: Obtained Time Out: Verified patient identification, verified procedure, site/side was marked, verified correct patient position, special equipment/implants available, Radiology Safety Procedures followed, medications/allergies/relevent history reviewed, required imaging and test results available. Performed  The patient has been on adequate anticoagulation. The patient received IV Propofol ( see above ) for sedation. Synchronous cardioversion was performed at 120 joules.  The cardioversion was successful. It took about 5  seconds from cardioversion to the resumption of sinus rhythm. He is now in sinus brady    Complications: No apparent complications Patient did tolerate procedure well.   Thayer Headings, Brooke Bonito., MD, [facility] 09/08/2015, 2:21 PM    TEE FROM 08/02/2015:  Results: Left ventricle: There was severe concentric hypertrophy. Systolic function was normal. The estimated ejection fraction was in the range of 55% to 60%. Wall motion was normal; there were no regional wall motion abnormalities. - Aortic valve: There are thin mobile filamentous densities noted on the aortic side of the AV most consistent with Lamble&'s excrescences. These appear to intermittently prolapse into the LVOT. There was trivial regurgitation. - Aorta: Mildly dilated aortic root at sinuses of Valsalva measuring 4 cm. - Mitral valve: No evidence of vegetation. There was mild regurgitation. - Left atrium: There is no evidence of thrombus in the LA appendage. The LA is dilated with significant spontaneous echo contrast near the MV inflow tract consistent with sluggish flow. There is significant swirling in this area with possible early forming thrombus. There is a mobile bright very small target that is seen in this area intermittently of unknown significance. Cannot discern whether this is a torn chordae tendinae or small thrombus. Would expect more mitral regurgitation if this were a ruptured chord. Would consider this a possible thrombus in setting of significant smoke. No evidence of thrombus in the atrial cavity or appendage. - Right atrium: No evidence of thrombus in the atrial cavity or appendage. - Atrial septum: There was increased thickness of the septum, consistent with lipomatous hypertrophy. - Tricuspid valve: There was trivial regurgitation. - Pulmonic valve: No evidence of vegetation. There was trivial regurgitation.  Due to concern for early forming thrombus in  the LA, cardioversion was cancelled. Patient's heart rate was mildly increased so he was instructed to increase Lopressor to 50mg  BID and he will be seen by our extender in the office on Friday.   Carotid dopplers: 07/27/15: Notes Recorded by Jerline Pain, MD on 07/28/2015 at 6:15 AM Heterogeneous plaque, bilaterally.  Stable 60-79% RICA stenosis.  Stable 123456 LICA stenosis.  Essentially stable >50% stenosis in the bilateral ECA's.  Normal subclavian arteries, bilaterally.  Patent vertebral arteries  with antegrade flow.  F/U 1 year  Holter Study Highlights from 07/2015    Atrial flutter Rats 57 to 165 bpm Average HR 105 bpm  Frequent PVCs , couplet. 1 7 beat run NSVT. No pauses       ASSESSMENT AND PLAN:  1. Atrial flutter (HCC)/atrial fib - he is now s/p TEE cardioversion. He remains in NSR but slow. He has had bradycardia in the past and has had to have his metoprolol stopped. Cutting back to 12.5 mg BID. He is currently asymptomatic.   2. URI   3. Chronic anticoagulation - needs follow up labs today.  4. LV dysfunction - EF down to 35% by recent TEE - his EF was normal on the one prior. Would favor repeat Echo in 3 months. He is currently well compensated and without dyspnea. Would limit salt use.   5. Dyspnea - most likely multifactorial from COPD/systolic/diastolic dysfunction - this has improved with restoration of NSR  6. CAD (coronary artery disease) of artery bypass graft - no reports of chest pain  7. Essential hypertension, benign - BP is ok on current regimen.  6. Carotid disease - repeat study 06/2016         Current medicines are reviewed with the patient today.  The patient does not have concerns regarding medicines other than what has been noted above.  The following changes have been made:  See above.  Labs/ tests ordered today include:    Orders Placed This Encounter  Procedures  . Basic metabolic panel  . CBC  . Hepatic  function panel  . EKG 12-Lead     Disposition:   FU with Dr. Marlou Porch in 3 months. Consider echo on return.   Patient is agreeable to this plan and will call if any problems develop in the interim.   Signed: Burtis Junes, RN, ANP-C 10/13/2015 11:46 AM  [place] [address] [place], [place]  [address] Phone: [phone] Fax: [phone]

## 2015-10-13 NOTE — Patient Instructions (Addendum)
We will be checking the following labs today - BMET, CBC and HPF   Medication Instructions:    Continue with your current medicines. BUT  I am cutting the Metoprolol back to just 1/2 a tablet (12.5 mg) twice a day    Testing/Procedures To Be Arranged:  N/A  Follow-Up:   See Dr. Marlou Porch in 3 months with EKG    Other Special Instructions:   Limit your use of salt  Ok to use plain Mucinex  Ok to use plain Robitussin    If you need a refill on your cardiac medications before your next appointment, please call your pharmacy.   Call the [place] office at [phone] if you have any questions, problems or concerns.

## 2015-10-14 LAB — BASIC METABOLIC PANEL
BUN: 16 mg/dL (ref 7–25)
CO2: 26 mmol/L (ref 20–31)
Calcium: 9.2 mg/dL (ref 8.6–10.3)
Chloride: 107 mmol/L (ref 98–110)
Creat: 1.37 mg/dL — ABNORMAL HIGH (ref 0.70–1.18)
Glucose, Bld: 66 mg/dL (ref 65–99)
Potassium: 4 mmol/L (ref 3.5–5.3)
Sodium: 141 mmol/L (ref 135–146)

## 2015-10-14 LAB — HEPATIC FUNCTION PANEL
ALT: 20 U/L (ref 9–46)
AST: 27 U/L (ref 10–35)
Albumin: 3.6 g/dL (ref 3.6–5.1)
Alkaline Phosphatase: 73 U/L (ref 40–115)
Bilirubin, Direct: 0.2 mg/dL (ref <=0.2)
Indirect Bilirubin: 0.4 mg/dL (ref 0.2–1.2)
Total Bilirubin: 0.6 mg/dL (ref 0.2–1.2)
Total Protein: 6.7 g/dL (ref 6.1–8.1)

## 2015-10-25 DIAGNOSIS — Z23 Encounter for immunization: Secondary | ICD-10-CM | POA: Diagnosis not present

## 2015-12-06 ENCOUNTER — Ambulatory Visit (INDEPENDENT_AMBULATORY_CARE_PROVIDER_SITE_OTHER): Payer: Medicare Other | Admitting: Cardiology

## 2015-12-06 ENCOUNTER — Telehealth: Payer: Self-pay | Admitting: Nurse Practitioner

## 2015-12-06 ENCOUNTER — Encounter: Payer: Self-pay | Admitting: Cardiology

## 2015-12-06 VITALS — BP 112/80 | HR 120 | Ht 65.0 in | Wt 154.4 lb

## 2015-12-06 DIAGNOSIS — I10 Essential (primary) hypertension: Secondary | ICD-10-CM | POA: Diagnosis not present

## 2015-12-06 DIAGNOSIS — I4892 Unspecified atrial flutter: Secondary | ICD-10-CM | POA: Diagnosis not present

## 2015-12-06 DIAGNOSIS — Z79899 Other long term (current) drug therapy: Secondary | ICD-10-CM | POA: Diagnosis not present

## 2015-12-06 LAB — MAGNESIUM: MAGNESIUM: 2.1 mg/dL (ref 1.5–2.5)

## 2015-12-06 LAB — BASIC METABOLIC PANEL
BUN: 19 mg/dL (ref 7–25)
CALCIUM: 9.6 mg/dL (ref 8.6–10.3)
CO2: 29 mmol/L (ref 20–31)
CREATININE: 1.1 mg/dL (ref 0.70–1.18)
Chloride: 104 mmol/L (ref 98–110)
Glucose, Bld: 74 mg/dL (ref 65–99)
Potassium: 4.7 mmol/L (ref 3.5–5.3)
SODIUM: 142 mmol/L (ref 135–146)

## 2015-12-06 LAB — CBC WITH DIFFERENTIAL/PLATELET
BASOS ABS: 87 {cells}/uL (ref 0–200)
Basophils Relative: 1 %
EOS ABS: 87 {cells}/uL (ref 15–500)
EOS PCT: 1 %
HCT: 42.3 % (ref 38.5–50.0)
Hemoglobin: 13.8 g/dL (ref 13.2–17.1)
LYMPHS PCT: 27 %
Lymphs Abs: 2349 cells/uL (ref 850–3900)
MCH: 30.5 pg (ref 27.0–33.0)
MCHC: 32.6 g/dL (ref 32.0–36.0)
MCV: 93.6 fL (ref 80.0–100.0)
MONOS PCT: 7 %
MPV: 9.7 fL (ref 7.5–12.5)
Monocytes Absolute: 609 cells/uL (ref 200–950)
NEUTROS PCT: 64 %
Neutro Abs: 5568 cells/uL (ref 1500–7800)
PLATELETS: 253 10*3/uL (ref 140–400)
RBC: 4.52 MIL/uL (ref 4.20–5.80)
RDW: 14.4 % (ref 11.0–15.0)
WBC: 8.7 10*3/uL (ref 3.8–10.8)

## 2015-12-06 LAB — TSH: TSH: 1.32 m[IU]/L (ref 0.40–4.50)

## 2015-12-06 MED ORDER — METOPROLOL TARTRATE 25 MG PO TABS: 25.0000 mg | ORAL_TABLET | Freq: Two times a day (BID) | ORAL | Status: DC

## 2015-12-06 NOTE — Telephone Encounter (Signed)
PT  AWARE   WILL COME  IN  TODAY FOR   EKG  AND  TO  SEE BRITTANY SIMMONS AT  11:30 AM .Adonis Housekeeper

## 2015-12-06 NOTE — Telephone Encounter (Signed)
New Message  Pt wife called for the patient. Pt states that by 9 am on Sunday he had SOB for 5-10 minutes it eased off and went away but he is converned about it.  Pt c/o Shortness Of Breath: STAT if SOB developed within the last 24 hours or pt is noticeably SOB on the phone  1. Are you currently SOB (can you hear that pt is SOB on the phone)? No 2. How long have you been experiencing SOB? Sporadic.. Wednesday morning Saturday evening and Sunday morning . 3. Are you SOB when sitting or when up moving around? Moving around  4.  Are you currently experiencing any other symptoms? Light headed when it happens   Pt request to have this message sent to [facility]

## 2015-12-06 NOTE — Patient Instructions (Signed)
Medication Instructions:  Your physician has recommended you make the following change in your medication:  INCREASE Metoprolol to 25mg  Twice daily. An Rx has been sent to your pharmacy  Labwork: Bmet, Cbc, Tsh, Magnesium  Testing/Procedures: None ordered  Follow-Up: Your physician recommends that you schedule a follow-up appointment in: 1 week with Dr.Skains/ or Brittainy Simmons,PA   Any Other Special Instructions Will Be Listed Below (If Applicable). Call the office if your heartrate drops below 50 bpm or got to the ED if symptoms worsen     If you need a refill on your cardiac medications before your next appointment, please call your pharmacy.

## 2015-12-06 NOTE — Telephone Encounter (Signed)
SPOKE WITH PT  RE MESSAGE :PT  HAS  NOTED  APPROX  3 EPISODES OF  SOB  SINCE  LAST  WED  THAT  ONLY LAST   FEW  MINUTES.  ALSO NOTES   SOME LIGHTHEADEDNESS. PER PT  PRIOR  TO CABG  HAD  DIFFERENT  SYMPTOMS  DID  FEEL HOWEVER  THESE  SYMPTOMS WERE SIMILAR PRIOR TO  CARDIOVERSION FOR AFIB  ,HAD  PT  CHECK  B/P AND  HR   AND  READINGS  WERE  150/122 AND 127 .PT  HAS NOT  TAKEN AM MEDS ,  AS OF YET.  INSTRUCTED  PT  TO  TAKE   MEDS   AND   RE CHECK  VITALS   IN COUPLE OF  HOURS  WILL FORWARD TO [facility]  FOR REVIEW.CY

## 2015-12-06 NOTE — Telephone Encounter (Signed)
Would get EKG.  Most likely will need OV. ? Any Flex available today if EKG shows he is in AF?

## 2015-12-06 NOTE — Progress Notes (Signed)
12/06/2015 [name]   29-Mar-1946  [id number]  Primary Physician Mickie Hillier, MD Primary Cardiologist: Dr. Marlou Porch   Reason for Visit/CC: SOB; PAF  HPI:  70 y/o male, followed by Dr. Marlou Porch. He has known CAD status post CABG in 2008 by Dr. Darcey Nora. He had postop atrial fibrillation but had had no recurrence until he presented on 07/29/15 complaining of dizziness and near syncope. Was found to be in atrial flutter and was placed on Xarelto and set up for TEE guided cardioversion. CHADSVASC=3. TEE showed questionable clot in the left atrium so cardioversion was canceled. Lopressor was increased due to elevated HR. It should be noted he had bradycardia at the rate of 45 two years ago and had metoprolol stopped. He is now on low dose metoprolol and has been tolerating ok. Other issues include HTN, HLD, COPD & carotid disease.  He was seen in January in clinic with complaint of worsening dyspnea. He was still if afib. Repeat TEE was performed, this time negative for LA thrombus. He then underwent successul DCCV back to NSR/ sinus brady. He was seen by Truitt Merle, for post procedural f/u on 10/13/15 and was still in SR. His HR was slow, thus his metoprolol was reduced to 12.5 mg BID. He was advised to f/u with Dr. Marlou Porch in 3 months. This OV is scheduled for 01/11/16.   He now presents to [name], as an add on, with a complaint of SOB. This is described and intermittent bouts of dyspnea lasting 5 min at a time. This has been ocurring off and on for the last week. He denies CP. He notes occasional dizziness but no syncope/ near syncope. He is currently w/o dyspnea in clinic today. His is comfortable at rest in no distress.    Current Outpatient Prescriptions  Medication Sig Dispense Refill  . acetaminophen (TYLENOL) 500 MG tablet Take 1,000 mg by mouth daily as needed for headache.     . ALPRAZolam (XANAX) 0.25 MG tablet Take 0.25 mg by mouth at bedtime as needed for anxiety (Take 1/2-1  tablet twice a day as needed).    [name] atorvastatin (LIPITOR) 40 MG tablet Take 40 mg by mouth daily at 6 PM.    . dextromethorphan-guaiFENesin (MUCINEX DM) 30-600 MG 12hr tablet Take 1 tablet by mouth 2 (two) times daily as needed for cough. Reported on 09/07/2015    . furosemide (LASIX) 20 MG tablet Take 1 tablet (20 mg total) by mouth daily. 30 tablet 6  . lisinopril (PRINIVIL,ZESTRIL) 20 MG tablet Take 1 tablet (20 mg total) by mouth 2 (two) times daily. 180 tablet 1  . metoprolol tartrate (LOPRESSOR) 25 MG tablet Take 0.5 tablets (12.5 mg total) by mouth 2 (two) times daily. 60 tablet 6  . Multiple Vitamins-Minerals (MULTIVITAMIN WITH MINERALS) tablet Take 1 tablet by mouth daily.    . nitroGLYCERIN (NITROSTAT) 0.4 MG SL tablet Place 1 tablet (0.4 mg total) under the tongue every 5 (five) minutes as needed for chest pain. 25 tablet 6  . Omega-3 Fatty Acids (FISH OIL) 1200 MG CAPS Take 1 capsule by mouth daily.     . rivaroxaban (XARELTO) 20 MG TABS tablet Take 1 tablet (20 mg total) by mouth daily with supper. 30 tablet 6  . vitamin C (ASCORBIC ACID) 500 MG tablet Take 500 mg by mouth daily.     No current facility-administered medications for this visit.    Allergies  Allergen Reactions  . Doxycycline Shortness Of Breath  Also has to urinate more often  . Nsaids     Gastritis     Social History   Social History  . Marital Status: Married    Spouse Name: N/A  . Number of Children: N/A  . Years of Education: N/A   Occupational History  . Not on file.   Social History Main Topics  . Smoking status: Former Research scientist (life sciences)  . Smokeless tobacco: Not on file  . Alcohol Use: Not on file  . Drug Use: Not on file  . Sexual Activity: Not on file   Other Topics Concern  . Not on file   Social History Narrative     Review of Systems: General: negative for chills, fever, night sweats or weight changes.  Cardiovascular: negative for chest pain, dyspnea on exertion, edema, orthopnea,  palpitations, paroxysmal nocturnal dyspnea or shortness of breath Dermatological: negative for rash Respiratory: negative for cough or wheezing Urologic: negative for hematuria Abdominal: negative for nausea, vomiting, diarrhea, bright red blood per rectum, melena, or hematemesis Neurologic: negative for visual changes, syncope, or dizziness All other systems reviewed and are otherwise negative except as noted above.    Blood pressure 112/80, pulse 120, height 5\' 5"  (1.651 m), weight 154 lb 6.4 oz (70.035 kg), SpO2 94 %.  General appearance: alert, cooperative and no distress Neck: no JVD, supple, symmetrical, trachea midline and thyroid not enlarged, symmetric, no tenderness/mass/nodules Lungs: clear to auscultation bilaterally Heart: irregularly irregular rhythm and tachy rate Extremities: no LEE Pulses: 2+ and symmetric Skin: warm and dry Neurologic: Grossly normal  EKG atrial flutter with RVR 120 bpm   ASSESSMENT AND PLAN:   1. Paroxysmal Atrial Fibrillation/Flutter: rate is 120 bpm but BP is stable at 112/80. He is currently asymptomatic w/o resting dyspnea. O2 sats are 94% on RA. He notes occasional bouts of dyspnea, usually lasting 5 min at a time. No exertional CP. He reports full medication compliance with Xarelto and metoprolol. He drinks a moderate amount of caffeine. No ETOH or OTC drugs. No recent fever, chills, n/v/d. Patient advised to reduce caffeine intake. We will also increase his metoprolol back up to 25 mg BID for better rate control. I recommended monitoring HR with ambulatory monitor to help guide rate control therapy, however patient not agreeable to this at this time. He has a BP monitor at home and will monitor pulse rate closely. He is to notify our office if he develops symptomatic bradycardia, particularly any pulse rate < 50 bpm. If we are unable to manage his rate adequately due to issues with bradycardia, we may need to consider PPM implantation to allow for  aggressive rate control therapy vs initiation of AAD or possible ablation. Given his history of LA thrombus, I am hesitant to initiate amiodarone at this time w/o TEE to rule out recurrent thrombus. He is to continue Xarelto daily and will f/u in clinic in 1 week for repeat assessment. We will also obtain a TSH, BMP, Mg and CBC today to assess for any reversible causes. Patient instructed to go to the ED if condition worsens.    PLAN  F/u in 1 week.   Lyda Jester PA-C 12/06/2015 12:28 PM

## 2015-12-08 ENCOUNTER — Telehealth: Payer: Self-pay | Admitting: Cardiology

## 2015-12-08 NOTE — Telephone Encounter (Signed)
According to the notes on the last carotid doppler completed 07/27/15 Dr Marlou Porch ordered a 1 yr f/u.  Pt is not due until 07/26/16.

## 2015-12-08 NOTE — Telephone Encounter (Signed)
Pt's wife wants to make appt for Carotid doppler -need order-appt with [facility] 01-11-16- if needed -he has them every 6 months  [phone] until 2p or can leave message

## 2015-12-08 NOTE — Telephone Encounter (Signed)
Left message for pt of below, as requested.

## 2015-12-12 NOTE — Progress Notes (Signed)
Cardiology Office Note   Date:  12/14/2015   ID:  [name], [name] 1946-06-02, MRN [id number]  PCP:  Mickie Hillier, MD  Cardiologist:  Dr. Marlou Porch    Chief Complaint  Patient presents with  . paroxysmal atrial fibrillation  . Hypertension      History of Present Illness: [name] is a 70 y.o. male who presents for a fib visit.    He has a history of known CAD status post CABG in 2008 by Dr. Darcey Nora. He had postop atrial fibrillation but had had no recurrence until he presented on 07/29/15 complaining of dizziness and near syncope. Was found to be in atrial flutter and was placed on Xarelto and set up for TEE guided cardioversion. CHADSVASC=3. TEE showed questionable clot in the left atrium so cardioversion was canceled. Lopressor was increased due to elevated HR. It should be noted he had bradycardia at the rate of 45 two years ago and had metoprolol stopped. He was on low dose metoprolol and has been tolerating ok. Other issues include HTN, HLD, COPD & carotid disease. in January in clinic with complaint of worsening dyspnea. He was still if afib. Repeat TEE was performed, this time negative for LA thrombus. He then underwent successul DCCV back to NSR/ sinus brady. He was seen by Truitt Merle, for post procedural f/u on 10/13/15 and was still in SR. His HR was slow in the 40s, , thus his metoprolol was reduced to 12.5 mg BID.   [facility] present[facility] to [facility],  Last week with a complaint of SOB. This was described and intermittent bouts of dyspnea lasting 5 min at a time. This has been ocurring off and on for the week prior to visit.  He denied CP. He notes occasional dizziness but no syncope/ near syncope.  He was back in A fib with RVR.  His metoprolol was increased and he is back today to re-eval..    Today he continues in a fib now rate controlled.  No further symptoms of SOB and no chest pain.    We discussed his tachy-Brady syndrome and medication choices.  We briefly discussed  PPMs as well.    He has not missed any Xarelto and denies any bleeding.    Past Medical History  Diagnosis Date  . Hypertension   . CAD (coronary artery disease)   . Headache(784.0)   . COPD (chronic obstructive pulmonary disease) ([place])   . Gastritis   . Bilateral carotid artery disease ([place]) 07/21/2013    Past Surgical History  Procedure Laterality Date  . Coronary artery bypass graft    . Incision and drainage deep neck abscess    . Tee without cardioversion N/A 08/02/2015    Procedure: TRANSESOPHAGEAL ECHOCARDIOGRAM (TEE);  Surgeon: Sueanne Margarita, MD;  Location: [place];  Service: Cardiovascular;  Laterality: N/A;  . Cardioversion N/A 09/08/2015    Procedure: CARDIOVERSION;  Surgeon: Thayer Headings, MD;  Location: [facility] ENDOSCOPY;  Service: Cardiovascular;  Laterality: N/A;  . Tee without cardioversion N/A 09/08/2015    Procedure: TRANSESOPHAGEAL ECHOCARDIOGRAM (TEE);  Surgeon: Thayer Headings, MD;  Location[facility] ENDOSCOPY;  Service: Cardiovascular;  Laterality: N/A;     Current Outpatient Prescriptions  Medication Sig Dispense Refill  . acetaminophen (TYLENOL) 500 MG tablet Take 1,000 mg by mouth daily as needed for headache.     . ALPRAZolam (XANAX) 0.25 MG tablet Take 0.25 mg by mouth at bedtime as needed for anxiety (Take 1/2-1 tablet twice a  day as needed).    [name] atorvastatin (LIPITOR) 40 MG tablet Take 40 mg by mouth daily at 6 PM.    . furosemide (LASIX) 20 MG tablet Take 1 tablet (20 mg total) by mouth daily. 30 tablet 6  . lisinopril (PRINIVIL,ZESTRIL) 20 MG tablet Take 1 tablet (20 mg total) by mouth 2 (two) times daily. 180 tablet 1  . metoprolol tartrate (LOPRESSOR) 25 MG tablet Take 1 tablet (25 mg total) by mouth 2 (two) times daily. 60 tablet 6  . Multiple Vitamins-Minerals (MULTIVITAMIN WITH MINERALS) tablet Take 1 tablet by mouth daily.    . nitroGLYCERIN (NITROSTAT) 0.4 MG SL tablet Place 0.4 mg under the tongue every 5 (five) minutes as needed for chest pain  (x 3 doses).    . Omega-3 Fatty Acids (FISH OIL) 1200 MG CAPS Take 1 capsule by mouth daily.     . rivaroxaban (XARELTO) 20 MG TABS tablet Take 1 tablet (20 mg total) by mouth daily with supper. 30 tablet 6  . vitamin C (ASCORBIC ACID) 500 MG tablet Take 500 mg by mouth daily.     No current facility-administered medications for this visit.    Allergies:   Doxycycline and Nsaids    Social History:  The patient  reports that he has quit smoking. He does not have any smokeless tobacco history on file.   Family History:  The patient's family history includes Diabetes in his mother and sister; Heart attack in his father; Hypertension in his mother and sister; Sudden death in his father.    ROS:  General:no colds or fevers, no weight changes Skin:no rashes or ulcers HEENT:no blurred vision, no congestion CV:see HPI PUL:see HPI GI:no diarrhea constipation or melena, no indigestion GU:no hematuria, no dysuria MS:no joint pain, no claudication Neuro:no syncope, no lightheadedness Endo:no diabetes, no thyroid disease  Wt Readings from Last 3 Encounters:  12/13/15 154 lb 6.4 oz (70.035 kg)  12/06/15 154 lb 6.4 oz (70.035 kg)  10/13/15 148 lb 6.4 oz (67.314 kg)     PHYSICAL EXAM: VS:  BP 122/82 mmHg  Pulse 80  Ht 5\' 5"  (1.651 m)  Wt 154 lb 6.4 oz (70.035 kg)  BMI 25.69 kg/m2  SpO2 97% , BMI Body mass index is 25.69 kg/(m^2). General:Pleasant affect, NAD Skin:Warm and dry, brisk capillary refill HEENT:normocephalic, sclera clear, mucus membranes moist Neck:supple, no JVD, no bruits  Heart:irreg irreg without murmur, gallup, rub or click Lungs:clear without rales, rhonchi, or wheezes [id number], non tender, + BS, do not palpate liver spleen or masses Ext:no lower ext edema, 2+ pedal pulses, 2+ radial pulses Neuro:alert and oriented X 3, MAE, follows commands, + facial symmetry    EKG:  EKG is ordered today. The ekg ordered today demonstrates a fib to flutter rate now controlled,  at 79 the nonspecific ST and T wave abnormality is chronic.     Recent Labs: 10/13/2015: ALT 20 12/06/2015: BUN 19; Creat 1.10; Hemoglobin 13.8; Magnesium 2.1; Platelets 253; Potassium 4.7; Sodium 142; TSH 1.32    Lipid Panel    Component Value Date/Time   CHOL 113 08/11/2015 0856   CHOL 120 08/03/2014 0822   TRIG 59 08/11/2015 0856   HDL 54 08/11/2015 0856   HDL 47 08/03/2014 0822   CHOLHDL 2.1 08/11/2015 0856   CHOLHDL 2.6 08/03/2014 0822   VLDL 10 08/03/2014 0822   LDLCALC 47 08/11/2015 0856   LDLCALC 63 08/03/2014 0822       Other studies Reviewed: Additional studies/ records that  were reviewed today include: labs, previous notes. .  Echo TEE Study Conclusions  - Left ventricle: Systolic function was moderately reduced. The  estimated ejection fraction was in the range of 35% to 40%. - Aortic valve: No evidence of vegetation. - Left atrium: No evidence of thrombus in the atrial cavity or  appendage.  Impressions:  - This was done as the initial evaluation of a TEE / Cardioversion.  The subsequent cardioversion was successful  The patient complained of blurry vision for several minutes after  waking up from the procedure  His neuro exam was intact at that time.  his blurry vision resolved completely after about 30 minutes.  no complicatons  ASSESSMENT AND PLAN:  1. Paroxysmal Atrial Fibrillation/Flutter:  Recurrent and symptomatic though symptoms improved with controlled HR.  Discussed with Dr. Marlou Porch and he would like pt seen in a fib clinic.   Today HR controlled no acute symptoms now that rate is controlled.    ? antiarrythmic vs. Ablation or leave in A fib?  To see [name], NP in a fib clinic.    I kept appt with Dr. Marlou Porch and will leave to [facility]ch Penny if that needs to be put further out.    2.  SSS with HR in SB in 40s in AFib without increased BB HR 120 - hx of brady in past with BB.  Possible need for PPM  3. Anticoagulation on Xarelto.   No missed doses.  4. CAD with hx CABG no chest pain.   5. COPD  occ wheeze  6. Chronic systolic HF    Current medicines are reviewed with the patient today.  The patient Has no concerns regarding medicines.  The following changes have been made:  See above Labs/ tests ordered today include:see above  Disposition:   FU:  see above  Lennie Muckle, NP  12/14/2015 2:51 PM    [facility] [place], [place], [place], [place] Phone: [phone]; Fax: [phone]

## 2015-12-13 ENCOUNTER — Ambulatory Visit (INDEPENDENT_AMBULATORY_CARE_PROVIDER_SITE_OTHER): Payer: Medicare Other | Admitting: Cardiology

## 2015-12-13 ENCOUNTER — Encounter: Payer: Self-pay | Admitting: Cardiology

## 2015-12-13 VITALS — BP 122/82 | HR 80 | Ht 65.0 in | Wt 154.4 lb

## 2015-12-13 DIAGNOSIS — I25708 Atherosclerosis of coronary artery bypass graft(s), unspecified, with other forms of angina pectoris: Secondary | ICD-10-CM | POA: Diagnosis not present

## 2015-12-13 DIAGNOSIS — I4892 Unspecified atrial flutter: Secondary | ICD-10-CM

## 2015-12-13 DIAGNOSIS — Z7901 Long term (current) use of anticoagulants: Secondary | ICD-10-CM

## 2015-12-13 DIAGNOSIS — I480 Paroxysmal atrial fibrillation: Secondary | ICD-10-CM | POA: Diagnosis not present

## 2015-12-13 DIAGNOSIS — J438 Other emphysema: Secondary | ICD-10-CM

## 2015-12-13 NOTE — Patient Instructions (Signed)
Medication Instructions:  Your physician recommends that you continue on your current medications as directed. Please refer to the Current Medication list given to you today.    Labwork: -None  Testing/Procedures: -None  Follow-Up: Keep follow at A-Fib clinic with [name], NP, April 25 and Keep follow up appointment with Dr. Marlou Porch in May  Any Other Special Instructions Will Be Listed Below (If Applicable).     If you need a refill on your cardiac medications before your next appointment, please call your pharmacy.

## 2015-12-14 ENCOUNTER — Encounter: Payer: Self-pay | Admitting: Cardiology

## 2015-12-21 ENCOUNTER — Ambulatory Visit (HOSPITAL_COMMUNITY)
Admission: RE | Admit: 2015-12-21 | Discharge: 2015-12-21 | Disposition: A | Discharge status: Home / Self Care | Payer: Medicare Other | Source: Ambulatory Visit | Attending: Nurse Practitioner | Admitting: Nurse Practitioner

## 2015-12-21 ENCOUNTER — Encounter (HOSPITAL_COMMUNITY): Payer: Self-pay | Admitting: Nurse Practitioner

## 2015-12-21 VITALS — BP 138/82 | HR 126 | Ht 65.0 in | Wt 153.2 lb

## 2015-12-21 DIAGNOSIS — Z0189 Encounter for other specified special examinations: Secondary | ICD-10-CM | POA: Insufficient documentation

## 2015-12-21 DIAGNOSIS — Z7901 Long term (current) use of anticoagulants: Secondary | ICD-10-CM | POA: Diagnosis not present

## 2015-12-21 DIAGNOSIS — J449 Chronic obstructive pulmonary disease, unspecified: Secondary | ICD-10-CM | POA: Diagnosis not present

## 2015-12-21 DIAGNOSIS — I2510 Atherosclerotic heart disease of native coronary artery without angina pectoris: Secondary | ICD-10-CM | POA: Diagnosis not present

## 2015-12-21 DIAGNOSIS — I4891 Unspecified atrial fibrillation: Secondary | ICD-10-CM | POA: Diagnosis not present

## 2015-12-21 DIAGNOSIS — K2970 Gastritis, unspecified, without bleeding: Secondary | ICD-10-CM | POA: Diagnosis not present

## 2015-12-21 DIAGNOSIS — I4892 Unspecified atrial flutter: Secondary | ICD-10-CM | POA: Insufficient documentation

## 2015-12-21 DIAGNOSIS — I7789 Other specified disorders of arteries and arterioles: Secondary | ICD-10-CM | POA: Diagnosis not present

## 2015-12-21 DIAGNOSIS — Z87891 Personal history of nicotine dependence: Secondary | ICD-10-CM | POA: Insufficient documentation

## 2015-12-21 DIAGNOSIS — I10 Essential (primary) hypertension: Secondary | ICD-10-CM | POA: Diagnosis not present

## 2015-12-21 DIAGNOSIS — R51 Headache: Secondary | ICD-10-CM | POA: Diagnosis not present

## 2015-12-21 MED ORDER — METOPROLOL TARTRATE 25 MG PO TABS: 37.5000 mg | ORAL_TABLET | Freq: Two times a day (BID) | ORAL | Status: DC

## 2015-12-21 MED ORDER — RIVAROXABAN 20 MG PO TABS: 20.0000 mg | ORAL_TABLET | Freq: Every day | ORAL | Status: DC

## 2015-12-21 NOTE — Progress Notes (Signed)
Patient ID: [name], male   DOB: 1946-03-31, 70 y.o.   MRN: [id number]     Primary Care Physician: Mickie Hillier, MD Referring Physician: Cecilie Kicks, NP, Dr. Marlou Porch, MD   [name] is a 70 y.o. male with a h/o known CAD status post CABG in 2008 by Dr. Darcey Nora. He had postop atrial fibrillation but had had no recurrence until he presented on 07/29/15 complaining of dizziness and near syncope. Was found to be in atrial flutter and was placed on Xarelto and set up for TEE guided cardioversion. CHADSVASC=3. TEE showed questionable clot in the left atrium so cardioversion was canceled. Lopressor was increased due to elevated HR. It should be noted he had bradycardia at the rate of 45 two years ago and had metoprolol stopped. He was on low dose metoprolol and has been tolerating ok. Other issues include HTN, HLD, COPD & carotid disease.  In January was seen with complaint of worsening dyspnea. He was still if afib. Repeat TEE was performed, this time negative for LA thrombus. He then underwent successul DCCV back to NSR/ sinus brady. He was seen by [name], for post procedural f/u on 10/13/15 and was still in SR. His HR was slow in the 40s,  thus his metoprolol was reduced to 12.5 mg BID.   [facility] presentd to [facility], with a complaint of SOB. This was described and intermittent bouts of dyspnea lasting 5 min at a time. This has been ocurring off and on for the week prior to visit. He denied CP. He notes occasional dizziness but no syncope/ near syncope. He was back in A fib with RVR. His metoprolol was increased.   He is in afib clinic today to discuss options going forward to management afib. He tolerates afib ok but does have more energy in SR. He still works full time as an Clinical biochemist. One issue is that when he is in SR, he is very slow mostly in the 40's and rate control has to be stopped. SSS has been mentioned and possible need for pacemaker. He does not have medicare drug  coverage and is paying $400 a month for DOAC so I think the cost of tiksoyn would out weigh its use. He has not had a TTE and I cannot see left atrial size by TEE so will order a TTE to look at atrial size. I have known Dr. Rayann Heman in  some pts who have issues with bradycardia in SR, take them straight to ablation without having to fail antiarrythmic.  Today, he denies symptoms of palpitations, chest pain, shortness of breath, orthopnea, PND, lower extremity edema, dizziness, presyncope, syncope, or neurologic sequela. The patient is tolerating medications without difficulties and is otherwise without complaint today.   Past Medical History  Diagnosis Date  . Hypertension   . CAD (coronary artery disease)   . Headache(784.0)   . COPD (chronic obstructive pulmonary disease) ([place])   . Gastritis   . Bilateral carotid artery disease ([place]) 07/21/2013   Past Surgical History  Procedure Laterality Date  . Coronary artery bypass graft    . Incision and drainage deep neck abscess    . Tee without cardioversion N/A 08/02/2015    Procedure: TRANSESOPHAGEAL ECHOCARDIOGRAM (TEE);  Surgeon: Sueanne Margarita, MD;  Location: [place];  Service: Cardiovascular;  Laterality: N/A;  . Cardioversion N/A 09/08/2015    Procedure: CARDIOVERSION;  Surgeon: Thayer Headings, MD;  Location: [place];  Service: Cardiovascular;  Laterality: N/A;  .  Tee without cardioversion N/A 09/08/2015    Procedure: TRANSESOPHAGEAL ECHOCARDIOGRAM (TEE);  Surgeon: Thayer Headings, MD;  Location: [facility] ENDOSCOPY;  Service: Cardiovascular;  Laterality: N/A;    Current Outpatient Prescriptions  Medication Sig Dispense Refill  . acetaminophen (TYLENOL) 500 MG tablet Take 1,000 mg by mouth daily as needed for headache.     . ALPRAZolam (XANAX) 0.25 MG tablet Take 0.25 mg by mouth at bedtime as needed for anxiety (Take 1/2-1 tablet twice a day as needed).    [name] atorvastatin (LIPITOR) 40 MG tablet Take 40 mg by mouth daily at 6 PM.    .  furosemide (LASIX) 20 MG tablet Take 1 tablet (20 mg total) by mouth daily. 30 tablet 6  . lisinopril (PRINIVIL,ZESTRIL) 20 MG tablet Take 1 tablet (20 mg total) by mouth 2 (two) times daily. 180 tablet 1  . metoprolol tartrate (LOPRESSOR) 25 MG tablet Take 1.5 tablets (37.5 mg total) by mouth 2 (two) times daily. 60 tablet 6  . Multiple Vitamins-Minerals (MULTIVITAMIN WITH MINERALS) tablet Take 1 tablet by mouth daily.    . nitroGLYCERIN (NITROSTAT) 0.4 MG SL tablet Place 0.4 mg under the tongue every 5 (five) minutes as needed for chest pain (x 3 doses).    . Omega-3 Fatty Acids (FISH OIL) 1200 MG CAPS Take 1 capsule by mouth daily.     . rivaroxaban (XARELTO) 20 MG TABS tablet Take 1 tablet (20 mg total) by mouth daily with supper. 30 tablet 11  . vitamin C (ASCORBIC ACID) 500 MG tablet Take 500 mg by mouth daily.     No current facility-administered medications for this encounter.    Allergies  Allergen Reactions  . Doxycycline Shortness Of Breath    Also has to urinate more often  . Nsaids Hypertension    Gastritis     Social History   Social History  . Marital Status: Married    Spouse Name: N/A  . Number of Children: N/A  . Years of Education: N/A   Occupational History  . Not on file.   Social History Main Topics  . Smoking status: Former Research scientist (life sciences)  . Smokeless tobacco: Not on file  . Alcohol Use: Not on file  . Drug Use: Not on file  . Sexual Activity: Not on file   Other Topics Concern  . Not on file   Social History Narrative    Family History  Problem Relation Age of Onset  . Diabetes Mother   . Heart attack Father   . Hypertension Mother   . Hypertension Sister   . Diabetes Sister   . Sudden death Father     ROS- All systems are reviewed and negative except as per the HPI above  Physical Exam: Filed Vitals:   12/21/15 0946  BP: 138/82  Pulse: 126  Height: 5\' 5"  (1.651 m)  Weight: 153 lb 3.2 oz (69.491 kg)    GEN- The patient is well  appearing, alert and oriented x 3 today.   Head- normocephalic, atraumatic Eyes-  Sclera clear, conjunctiva pink Ears- hearing intact Oropharynx- clear Neck- supple, no JVP Lymph- no cervical lymphadenopathy Lungs- Clear to ausculation bilaterally, normal work of breathing Heart- Rapid irregular rate and rhythm, no murmurs, rubs or gallops, PMI not laterally displaced GI- soft, NT, ND, + BS Extremities- no clubbing, cyanosis, or edema MS- no significant deformity or atrophy Skin- no rash or lesion Psych- euthymic mood, full affect Neuro- strength and sensation are intact  EKG-aflutter with 2:1 AV conduction  at 126 bpm, qrs int 78 ms, QTc 448 ms Epic records reviewed    Assessment and Plan: 1. Afib/flutter For now will rate control by increasing metoprolol to 37.5 mg bid Will obtain TTE He is to continue xarelto, pt helped with filling out assistance forms since drug is costing him $400 a month Considering if pt may be a candidate for amiodarone, but it does have BB properties and may exacerbate  bradycardia Flecainide is out due to h/o CAD Multaq would probably be ineffective and also has BB properties  Tikosyn is out of question due to pt not having Medicare D and having to pay for his drugs out of pocket, would probably run several hundred dollars a month PPM may be have to considered with his issues with Brady(SSS) in SR. Will discuss with Dr. Rayann Heman and further discuss when pt returns in one - two weeks after  Echo.  [name], [facility] [address] [place], [place] [address] [phone]

## 2015-12-21 NOTE — Patient Instructions (Signed)
Your physician has recommended you make the following change in your medication:  1)Increase metoprolol to 37.5mg  (1 1/2 tablet) twice a day

## 2015-12-27 DIAGNOSIS — Z5181 Encounter for therapeutic drug level monitoring: Secondary | ICD-10-CM

## 2015-12-27 DIAGNOSIS — Z79899 Other long term (current) drug therapy: Secondary | ICD-10-CM

## 2015-12-27 HISTORY — DX: Encounter for therapeutic drug level monitoring: Z51.81

## 2015-12-27 HISTORY — DX: Other long term (current) drug therapy: Z79.899

## 2015-12-29 ENCOUNTER — Ambulatory Visit (HOSPITAL_COMMUNITY)
Admission: RE | Admit: 2015-12-29 | Discharge: 2015-12-29 | Disposition: A | Discharge status: Home / Self Care | Payer: Medicare Other | Source: Ambulatory Visit | Attending: Nurse Practitioner | Admitting: Nurse Practitioner

## 2015-12-29 DIAGNOSIS — I340 Nonrheumatic mitral (valve) insufficiency: Secondary | ICD-10-CM | POA: Diagnosis not present

## 2015-12-29 DIAGNOSIS — I071 Rheumatic tricuspid insufficiency: Secondary | ICD-10-CM | POA: Diagnosis not present

## 2015-12-29 DIAGNOSIS — I119 Hypertensive heart disease without heart failure: Secondary | ICD-10-CM | POA: Insufficient documentation

## 2015-12-29 DIAGNOSIS — I2510 Atherosclerotic heart disease of native coronary artery without angina pectoris: Secondary | ICD-10-CM | POA: Diagnosis not present

## 2015-12-29 DIAGNOSIS — E785 Hyperlipidemia, unspecified: Secondary | ICD-10-CM | POA: Insufficient documentation

## 2015-12-29 DIAGNOSIS — I4891 Unspecified atrial fibrillation: Secondary | ICD-10-CM | POA: Diagnosis present

## 2015-12-29 DIAGNOSIS — I4892 Unspecified atrial flutter: Secondary | ICD-10-CM | POA: Insufficient documentation

## 2015-12-29 NOTE — Progress Notes (Signed)
Echocardiogram 2D Echocardiogram has been performed.  [name] 12/29/2015, 9:52 AM

## 2016-01-03 ENCOUNTER — Ambulatory Visit (HOSPITAL_COMMUNITY)
Admission: RE | Admit: 2016-01-03 | Discharge: 2016-01-03 | Disposition: A | Discharge status: Home / Self Care | Payer: Medicare Other | Source: Ambulatory Visit | Attending: Nurse Practitioner | Admitting: Nurse Practitioner

## 2016-01-03 ENCOUNTER — Telehealth: Payer: Self-pay | Admitting: Physician Assistant

## 2016-01-03 ENCOUNTER — Encounter (HOSPITAL_COMMUNITY): Payer: Self-pay | Admitting: Nurse Practitioner

## 2016-01-03 VITALS — BP 142/68 | HR 75 | Ht 65.0 in | Wt 153.6 lb

## 2016-01-03 DIAGNOSIS — Z87891 Personal history of nicotine dependence: Secondary | ICD-10-CM | POA: Insufficient documentation

## 2016-01-03 DIAGNOSIS — I779 Disorder of arteries and arterioles, unspecified: Secondary | ICD-10-CM | POA: Diagnosis not present

## 2016-01-03 DIAGNOSIS — Z0189 Encounter for other specified special examinations: Secondary | ICD-10-CM | POA: Insufficient documentation

## 2016-01-03 DIAGNOSIS — J449 Chronic obstructive pulmonary disease, unspecified: Secondary | ICD-10-CM | POA: Insufficient documentation

## 2016-01-03 DIAGNOSIS — I2510 Atherosclerotic heart disease of native coronary artery without angina pectoris: Secondary | ICD-10-CM | POA: Insufficient documentation

## 2016-01-03 DIAGNOSIS — I481 Persistent atrial fibrillation: Secondary | ICD-10-CM | POA: Diagnosis not present

## 2016-01-03 DIAGNOSIS — I10 Essential (primary) hypertension: Secondary | ICD-10-CM | POA: Insufficient documentation

## 2016-01-03 DIAGNOSIS — I4892 Unspecified atrial flutter: Secondary | ICD-10-CM | POA: Insufficient documentation

## 2016-01-03 DIAGNOSIS — Z7901 Long term (current) use of anticoagulants: Secondary | ICD-10-CM | POA: Insufficient documentation

## 2016-01-03 DIAGNOSIS — I4819 Other persistent atrial fibrillation: Secondary | ICD-10-CM

## 2016-01-03 DIAGNOSIS — K2970 Gastritis, unspecified, without bleeding: Secondary | ICD-10-CM | POA: Insufficient documentation

## 2016-01-03 MED ORDER — METOPROLOL TARTRATE 25 MG PO TABS
37.5000 mg | ORAL_TABLET | Freq: Two times a day (BID) | ORAL | Status: DC
Start: 2016-01-03 — End: 2016-01-13

## 2016-01-03 NOTE — Progress Notes (Addendum)
Patient ID: [name], male   DOB: 08-Jun-1946, 70 y.o.   MRN: [id number]    Primary Care Physician: Mickie Hillier, MD Referring Physician: Dr. Elijah Birk is a 70 y.o. male with a h/o persistent afib/flutter with evidence for SSS, heart rate in the 40's, when in SR, prompting stopping of BB, f/u in the afib clinic today, for options for afib management. Echo obtained and shows a left atrium severely enlarged at 51 mm. EF mildly reduced at 45-50%. He reports that he feels well currently if HR stays controlled but one day he forgot BB dose and felt short of breath.Taking xarelto without fail for chadsvasc score of at least 4.  Discussed with Dr. Rayann Heman, and multaq/ flecainide not options due to LVD, CAD. Sotalol and amiodarone are not good options due to chronotropic effects. Genetic af not an option due to large amounts of bb involved with study, either bucindolol or metoprolol at 200 mg a day, which would cause issues with bradycardia/SSS. Dr. Rayann Heman does not want to pursue ablation with out trying at least one antiarrythmic and left atrial size may make him not an optimal candidate. Tikosyn may be an option but pt never signed up for medicate D and is paying $400 a month for eliquis out of pocket due to a stiff penalty. I talked to the SW and she said that at this point, the price of the penalty may be less  than having to pay out of pocket for both drugs. Pt will look into this with insurance agency. 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  Today, he denies symptoms of palpitations, chest pain, shortness of breath, orthopnea, PND, lower extremity edema, dizziness, presyncope, syncope, or neurologic sequela. The patient is tolerating medications without difficulties and is otherwise without complaint today.   Past Medical History  Diagnosis Date  . Hypertension   . CAD  (coronary artery disease)   . Headache(784.0)   . COPD (chronic obstructive pulmonary disease) ([place])   . Gastritis   . Bilateral carotid artery disease ([place]) 07/21/2013   Past Surgical History  Procedure Laterality Date  . Coronary artery bypass graft    . Incision and drainage deep neck abscess    . Tee without cardioversion N/A 08/02/2015    Procedure: TRANSESOPHAGEAL ECHOCARDIOGRAM (TEE);  Surgeon: Sueanne Margarita, MD;  Location: [place];  Service: Cardiovascular;  Laterality: N/A;  . Cardioversion N/A 09/08/2015    Procedure: CARDIOVERSION;  Surgeon: Thayer Headings, MD;  Location: [facility] ENDOSCOPY;  Service: Cardiovascular;  Laterality: N/A;  . Tee without cardioversion N/A 09/08/2015    Procedure: TRANSESOPHAGEAL ECHOCARDIOGRAM (TEE);  Surgeon: Thayer Headings, MD;  Location: [facility] ENDOSCOPY;  Service: Cardiovascular;  Laterality: N/A;    Current Outpatient Prescriptions  Medication Sig Dispense Refill  . acetaminophen (TYLENOL) 500 MG tablet Take 1,000 mg by mouth daily as needed for headache.     . ALPRAZolam (XANAX) 0.25 MG tablet Take 0.25 mg by mouth at bedtime as needed for anxiety (Take 1/2-1 tablet twice a day as needed).    [name] atorvastatin (LIPITOR) 40 MG tablet Take 40 mg by mouth daily at 6 PM.    . furosemide (LASIX) 20 MG tablet Take 1 tablet (20 mg total) by mouth daily. 30 tablet 6  . lisinopril (PRINIVIL,ZESTRIL) 20 MG tablet Take 1 tablet (20 mg total)  by mouth 2 (two) times daily. 180 tablet 1  . metoprolol tartrate (LOPRESSOR) 25 MG tablet Take 1.5 tablets (37.5 mg total) by mouth 2 (two) times daily. 90 tablet 6  . Multiple Vitamins-Minerals (MULTIVITAMIN WITH MINERALS) tablet Take 1 tablet by mouth daily.    . nitroGLYCERIN (NITROSTAT) 0.4 MG SL tablet Place 0.4 mg under the tongue every 5 (five) minutes as needed for chest pain (x 3 doses).    . Omega-3 Fatty Acids (FISH OIL) 1200 MG CAPS Take 1 capsule by mouth daily.     . rivaroxaban (XARELTO) 20 MG TABS tablet  Take 1 tablet (20 mg total) by mouth daily with supper. 30 tablet 11  . vitamin C (ASCORBIC ACID) 500 MG tablet Take 500 mg by mouth daily.     No current facility-administered medications for this encounter.    Allergies  Allergen Reactions  . Doxycycline Shortness Of Breath    Also has to urinate more often  . Nsaids Hypertension    Gastritis     Social History   Social History  . Marital Status: Married    Spouse Name: N/A  . Number of Children: N/A  . Years of Education: N/A   Occupational History  . Not on file.   Social History Main Topics  . Smoking status: Former Research scientist (life sciences)  . Smokeless tobacco: Not on file  . Alcohol Use: Not on file  . Drug Use: Not on file  . Sexual Activity: Not on file   Other Topics Concern  . Not on file   Social History Narrative    Family History  Problem Relation Age of Onset  . Diabetes Mother   . Heart attack Father   . Hypertension Mother   . Hypertension Sister   . Diabetes Sister   . Sudden death Father     ROS- All systems are reviewed and negative except as per the HPI above  Physical Exam: Filed Vitals:   01/03/16 0939  BP: 142/68  Pulse: 75  Height: 5\' 5"  (1.651 m)  Weight: 153 lb 9.6 oz (69.673 kg)    GEN- The patient is well appearing, alert and oriented x 3 today.   Head- normocephalic, atraumatic Eyes-  Sclera clear, conjunctiva pink Ears- hearing intact Oropharynx- clear Neck- supple, no JVP Lymph- no cervical lymphadenopathy Lungs- Clear to ausculation bilaterally, normal work of breathing Heart- irregular rate and rhythm, no murmurs, rubs or gallops, PMI not laterally displaced GI- soft, NT, ND, + BS Extremities- no clubbing, cyanosis, or edema MS- no significant deformity or atrophy Skin- no rash or lesion Psych- euthymic mood, full affect Neuro- strength and sensation are intact  EKG-aflutter with variable block at 75 bpm, qrs int 76 ms, qtc 402 ms Echo-- Left ventricle: The cavity size was  normal. Wall thickness was  normal. Systolic function was mildly reduced. The estimated  ejection fraction was in the range of 45% to 50%. Abnormal GLPSS  at -13% with inferoseptal strain abnormality. The study is not  technically sufficient to allow evaluation of LV diastolic  function. - Mitral valve: Mildly thickened leaflets . There was trivial  regurgitation. - Left atrium: Severely dilated at 51 ml/m2. - Right ventricle: The cavity size was mildly dilated. - Right atrium: The atrium was mildly dilated. - Tricuspid valve: There was trivial regurgitation. - Pulmonary arteries: PA peak pressure: 21 mm Hg (S). - Inferior vena cava: The vessel was normal in size. The  respirophasic diameter changes were in the normal range (>=  50%),  consistent with normal central venous pressure.  Impressions:  - Compared to the most recent echo in 08/2015, the LVEF is slightly  improved at 45-50% wtih inferoseptal wall motion and strain  abnormalities.  Assessment and Plan: 1. Persistent afib/flutter SEE HPI for options for restoring sinus rhtyhm Unfortunately, it boils down to staying rate controlled or tikosyn and there are cost restraints with this drug due to fact pt did not sign up for medicare D at the appropriate time and there is a penalty involved which at this point may be less that the cost of tikosyn/xarelto combined. He will check into this with insurance company and consider options, Continue xarelto, no missed doses  F/u with afib clinic in 3 weeks  Butch Penny C. Brailyn Delman, [facility] [address] [place], [place] [address] [phone]  [place] called into clinic today and said he had two bad episodes yesterday with afib with elevated HR and being short of breath with dizziness. 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 He is willing to pay out of pocket for the drug until he  can get signed up for Medicare D in October. I will staff message the pharmD's at [facility] street to get appointment for tikosyn loading.

## 2016-01-03 NOTE — Telephone Encounter (Signed)
     patient called on call provider. Had two bouts of SOB and dizziness that have now resolved. Patient has  Symptomatic afib and SSS. He saw Doristine Devoid in [place] clinic today to discuss options. They are plannign rate control vs Tikosyn depending on insurance coverage. I went over ED precautions. If he continues to feel better, he will continue follow up with afib clinic.    Angelena Form PA-C  [facility]

## 2016-01-04 NOTE — Addendum Note (Signed)
Encounter addended by: Sherran Needs, NP on: 01/04/2016  3:26 PM<BR>     Documentation filed: Notes Section

## 2016-01-10 ENCOUNTER — Encounter (HOSPITAL_COMMUNITY): Payer: Self-pay | Admitting: General Practice

## 2016-01-10 ENCOUNTER — Inpatient Hospital Stay (HOSPITAL_COMMUNITY)
Admission: AD | Admit: 2016-01-10 | Discharge: 2016-01-13 | DRG: 310 | Disposition: A | Discharge status: Home / Self Care | Payer: Medicare Other | Source: Ambulatory Visit | Attending: Internal Medicine | Admitting: Internal Medicine

## 2016-01-10 ENCOUNTER — Ambulatory Visit (INDEPENDENT_AMBULATORY_CARE_PROVIDER_SITE_OTHER): Payer: Medicare Other | Admitting: Pharmacist

## 2016-01-10 DIAGNOSIS — I25709 Atherosclerosis of coronary artery bypass graft(s), unspecified, with unspecified angina pectoris: Secondary | ICD-10-CM | POA: Diagnosis not present

## 2016-01-10 DIAGNOSIS — J449 Chronic obstructive pulmonary disease, unspecified: Secondary | ICD-10-CM | POA: Diagnosis not present

## 2016-01-10 DIAGNOSIS — Z951 Presence of aortocoronary bypass graft: Secondary | ICD-10-CM | POA: Diagnosis not present

## 2016-01-10 DIAGNOSIS — I779 Disorder of arteries and arterioles, unspecified: Secondary | ICD-10-CM

## 2016-01-10 DIAGNOSIS — N182 Chronic kidney disease, stage 2 (mild): Secondary | ICD-10-CM | POA: Diagnosis not present

## 2016-01-10 DIAGNOSIS — I255 Ischemic cardiomyopathy: Secondary | ICD-10-CM | POA: Diagnosis present

## 2016-01-10 DIAGNOSIS — I2510 Atherosclerotic heart disease of native coronary artery without angina pectoris: Secondary | ICD-10-CM | POA: Diagnosis present

## 2016-01-10 DIAGNOSIS — I495 Sick sinus syndrome: Secondary | ICD-10-CM | POA: Diagnosis not present

## 2016-01-10 DIAGNOSIS — I4892 Unspecified atrial flutter: Secondary | ICD-10-CM | POA: Diagnosis present

## 2016-01-10 DIAGNOSIS — Z7901 Long term (current) use of anticoagulants: Secondary | ICD-10-CM | POA: Diagnosis not present

## 2016-01-10 DIAGNOSIS — I739 Peripheral vascular disease, unspecified: Secondary | ICD-10-CM

## 2016-01-10 DIAGNOSIS — Z5181 Encounter for therapeutic drug level monitoring: Secondary | ICD-10-CM

## 2016-01-10 DIAGNOSIS — I481 Persistent atrial fibrillation: Principal | ICD-10-CM | POA: Diagnosis present

## 2016-01-10 DIAGNOSIS — E785 Hyperlipidemia, unspecified: Secondary | ICD-10-CM

## 2016-01-10 DIAGNOSIS — I4430 Unspecified atrioventricular block: Secondary | ICD-10-CM | POA: Diagnosis present

## 2016-01-10 DIAGNOSIS — I10 Essential (primary) hypertension: Secondary | ICD-10-CM | POA: Diagnosis present

## 2016-01-10 DIAGNOSIS — Z79899 Other long term (current) drug therapy: Secondary | ICD-10-CM

## 2016-01-10 HISTORY — DX: Unspecified atrial fibrillation: I48.91

## 2016-01-10 HISTORY — DX: Other long term (current) drug therapy: Z79.899

## 2016-01-10 HISTORY — DX: Encounter for therapeutic drug level monitoring: Z51.81

## 2016-01-10 LAB — BASIC METABOLIC PANEL
Anion gap: 9 (ref 5–15)
BUN: 16 mg/dL (ref 6–20)
CHLORIDE: 103 mmol/L (ref 101–111)
CO2: 29 mmol/L (ref 22–32)
CREATININE: 1.25 mg/dL — AB (ref 0.61–1.24)
Calcium: 9.5 mg/dL (ref 8.9–10.3)
GFR, EST NON AFRICAN AMERICAN: 56 mL/min — AB (ref >60)
Glucose, Bld: 97 mg/dL (ref 65–99)
Potassium: 4.1 mmol/L (ref 3.5–5.1)
SODIUM: 141 mmol/L (ref 135–145)

## 2016-01-10 LAB — MAGNESIUM: MAGNESIUM: 2 mg/dL (ref 1.7–2.4)

## 2016-01-10 MED ORDER — FUROSEMIDE 20 MG PO TABS
20.0000 mg | ORAL_TABLET | Freq: Every day | ORAL | Status: DC
  Administered 2016-01-11 – 2016-01-12 (×2): 20 mg via ORAL
  Filled 2016-01-10 (×2): qty 1

## 2016-01-10 MED ORDER — RIVAROXABAN 20 MG PO TABS
20.0000 mg | ORAL_TABLET | Freq: Every day | ORAL | Status: DC
  Administered 2016-01-10 – 2016-01-12 (×3): 20 mg via ORAL
  Filled 2016-01-10 (×3): qty 1

## 2016-01-10 MED ORDER — SODIUM CHLORIDE 0.9 % IV SOLN: 250.0000 mL | INTRAVENOUS | Status: DC | PRN

## 2016-01-10 MED ORDER — DOFETILIDE 250 MCG PO CAPS
250.0000 ug | ORAL_CAPSULE | Freq: Two times a day (BID) | ORAL | Status: DC
  Administered 2016-01-10: 250 ug via ORAL
  Filled 2016-01-10 (×2): qty 1

## 2016-01-10 MED ORDER — METOPROLOL TARTRATE 25 MG PO TABS
37.5000 mg | ORAL_TABLET | Freq: Two times a day (BID) | ORAL | Status: DC
  Administered 2016-01-10 – 2016-01-11 (×2): 37.5 mg via ORAL
  Filled 2016-01-10 (×2): qty 1

## 2016-01-10 MED ORDER — NITROGLYCERIN 0.4 MG SL SUBL: 0.4000 mg | SUBLINGUAL_TABLET | SUBLINGUAL | Status: DC | PRN

## 2016-01-10 MED ORDER — ALPRAZOLAM 0.25 MG PO TABS
0.2500 mg | ORAL_TABLET | Freq: Every day | ORAL | Status: DC
  Administered 2016-01-10 – 2016-01-12 (×3): 0.25 mg via ORAL
  Filled 2016-01-10 (×3): qty 1

## 2016-01-10 MED ORDER — ATORVASTATIN CALCIUM 40 MG PO TABS
40.0000 mg | ORAL_TABLET | Freq: Every day | ORAL | Status: DC
  Administered 2016-01-10 – 2016-01-12 (×3): 40 mg via ORAL
  Filled 2016-01-10 (×4): qty 1

## 2016-01-10 MED ORDER — SODIUM CHLORIDE 0.9% FLUSH
3.0000 mL | Freq: Two times a day (BID) | INTRAVENOUS | Status: DC
  Administered 2016-01-10: 3 mL via INTRAVENOUS

## 2016-01-10 MED ORDER — LISINOPRIL 20 MG PO TABS
20.0000 mg | ORAL_TABLET | Freq: Two times a day (BID) | ORAL | Status: DC
  Administered 2016-01-10 – 2016-01-13 (×6): 20 mg via ORAL
  Filled 2016-01-10 (×6): qty 1

## 2016-01-10 MED ORDER — SODIUM CHLORIDE 0.9% FLUSH: 3.0000 mL | INTRAVENOUS | Status: DC | PRN

## 2016-01-10 NOTE — H&P (Signed)
ELECTROPHYSIOLOGY CONSULT NOTE    Patient ID: [name] MRN: [id number], DOB/AGE: 72-Dec-1947 72 y.o.  Admit date: 01/10/2016 Date of Admission: 01/10/2016  Primary Physician: Mickie Hillier, MD Primary Cardiologist: Dr. Marlou Porch  Reason for Admission: Tikosyn Initiation  HPI: [name] is a 70 y.o. male who was seen in the AF Clinic 01/04/16, with a h/o persistent afib/flutter with evidence for SSS, heart rate in the 40's, when in SR, prompting stopping of BB, f/u in the afib clinic today, for options for afib management. Echo obtained and shows a left atrium severely enlarged at 51 mm. EF mildly reduced at 45-50%. He reports that he feels well currently if HR stays controlled but one day he forgot BB dose and felt short of breath.Taking xarelto without fail for chadsvasc score of at least 4.  [name] notes state she discussed with Dr. Rayann Heman, and multaq/ flecainide not options due to LVD, CAD. Sotalol and amiodarone are not good options due to chronotropic effects. Genetic af not an option due to large amounts of bb involved with study, either bucindolol or metoprolol at 200 mg a day, which would cause issues with bradycardia/SSS. Dr. Rayann Heman does not want to pursue ablation with out trying at least one antiarrythmic and left atrial size may make him not an optimal candidate. Tikosyn may be an option but pt never signed up for medicare D and is paying $400 a month for eliquis out of pocket due to a stiff penalty. I talked to the SW and she said that at this point, the price of the penalty may be less than having to pay out of pocket for both drugs. Pt will look into this with insurance agency. 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  Since this though he had 2 episodes of AFib feeling poorly and wanted to proceed with Tikosyn and met with RPH today at the office.   He had a  visit with [name] today for Tikosyn teaching, labs and EKG which her note states was reviewed by Dr. Rayann Heman showed Aflutter with variable AV block. Vent rate 81 bpm. QTC 411 msec.  The patient and his wife at bedside have no further questions and the patient wants to proceed with Tikosyn initiation.    He assures me he has not missed any doses of his Xarelto in > 4 weeks (longer, states he has not missed since it was started months ago.  He confirms his medication list as accurate.  Past Medical History  Diagnosis Date  . Hypertension   . CAD (coronary artery disease)   . Headache(784.0)   . COPD (chronic obstructive pulmonary disease) ([place])   . Gastritis   . Bilateral carotid artery disease ([place]) 07/21/2013     Surgical History:  Past Surgical History  Procedure Laterality Date  . Coronary artery bypass graft    . Incision and drainage deep neck abscess    . Tee without cardioversion N/A 08/02/2015    Procedure: TRANSESOPHAGEAL ECHOCARDIOGRAM (TEE);  Surgeon: Sueanne Margarita, MD;  Location: [place];  Service: Cardiovascular;  Laterality: N/A;  . Cardioversion N/A 09/08/2015    Procedure: CARDIOVERSION;  Surgeon: Thayer Headings, MD;  Location: [facility] ENDOSCOPY;  Service: Cardiovascular;  Laterality: N/A;  . Tee without cardioversion N/A 09/08/2015    Procedure: TRANSESOPHAGEAL ECHOCARDIOGRAM (TEE);  Surgeon: Thayer Headings, MD;  Location:  [facility] ENDOSCOPY;  Service: Cardiovascular;  Laterality: N/A;     Prescriptions prior to admission  Medication Sig Dispense Refill Last Dose  . acetaminophen (TYLENOL) 500 MG tablet Take 1,000 mg by mouth daily as needed for headache.    Taking  . ALPRAZolam (XANAX) 0.25 MG tablet Take 0.25 mg by mouth at bedtime as needed for anxiety (Take 1/2-1 tablet twice a day as needed).   Taking  . atorvastatin (LIPITOR) 40 MG tablet Take 40 mg by mouth daily at 6 PM.   Taking  . furosemide (LASIX) 20 MG tablet Take 1 tablet (20 mg total) by mouth daily. 30  tablet 6 Taking  . lisinopril (PRINIVIL,ZESTRIL) 20 MG tablet Take 1 tablet (20 mg total) by mouth 2 (two) times daily. 180 tablet 1 Taking  . metoprolol tartrate (LOPRESSOR) 25 MG tablet Take 1.5 tablets (37.5 mg total) by mouth 2 (two) times daily. 90 tablet 6   . Multiple Vitamins-Minerals (MULTIVITAMIN WITH MINERALS) tablet Take 1 tablet by mouth daily.   Taking  . nitroGLYCERIN (NITROSTAT) 0.4 MG SL tablet Place 0.4 mg under the tongue every 5 (five) minutes as needed for chest pain (x 3 doses).   Taking  . Omega-3 Fatty Acids (FISH OIL) 1200 MG CAPS Take 1 capsule by mouth daily.    Taking  . rivaroxaban (XARELTO) 20 MG TABS tablet Take 1 tablet (20 mg total) by mouth daily with supper. 30 tablet 11 Taking  . vitamin C (ASCORBIC ACID) 500 MG tablet Take 500 mg by mouth daily.   Taking    Inpatient Medications:   Allergies:  Allergies  Allergen Reactions  . Doxycycline Shortness Of Breath    Also has to urinate more often  . Nsaids Hypertension    Gastritis     Social History   Social History  . Marital Status: Married    Spouse Name: N/A  . Number of Children: N/A  . Years of Education: N/A   Occupational History  . Not on file.   Social History Main Topics  . Smoking status: Former Research scientist (life sciences)  . Smokeless tobacco: Not on file  . Alcohol Use: Not on file  . Drug Use: Not on file  . Sexual Activity: Not on file   Other Topics Concern  . Not on file   Social History Narrative     Family History  Problem Relation Age of Onset  . Diabetes Mother   . Heart attack Father   . Hypertension Mother   . Hypertension Sister   . Diabetes Sister   . Sudden death Father      Review of Systems: All other systems reviewed and are otherwise negative except as noted above.  Physical Exam: Filed Vitals:   01/10/16 1332  BP: 111/90  Pulse: 129  Temp: 97.9 F (36.6 C)  TempSrc: Oral  Weight: 152 lb 11.2 oz (69.264 kg)  SpO2: 94%    GEN- The patient is well appearing,  alert and oriented x 3 today.   HEENT: normocephalic, atraumatic; sclera clear, conjunctiva pink; hearing intact; oropharynx clear; neck supple, no JVP Lymph- no cervical lymphadenopathy Lungs- Clear to ausculation bilaterally, normal work of breathing.  No wheezes, rales, rhonchi Heart- irregular rate and rhythm, no murmurs, rubs or gallops, PMI not laterally displaced GI- soft, non-tender, non-distended, bowel sounds present Extremities- no clubbing, cyanosis, or edema MS- no significant deformity or atrophy Skin- warm and dry, no rash or lesion Psych- euthymic mood, full affect Neuro- no gross deficits observed  Labs:   Lab Results  Component Value Date   WBC 8.7 12/06/2015   HGB 13.8 12/06/2015   HCT 42.3 12/06/2015   MCV 93.6 12/06/2015   PLT 253 12/06/2015    Recent Labs Lab 01/10/16 0950  NA 141  K 4.1  CL 103  CO2 29  BUN 16  CREATININE 1.25*  CALCIUM 9.5  GLUCOSE 97      Radiology/Studies: No results found.  WVP:XTGG, 121 bpm TELEMETRY: AFib 80's-90's 12/29/15: Echocardiogram Study Conclusions - Left ventricle: The cavity size was normal. Wall thickness was  normal. Systolic function was mildly reduced. The estimated  ejection fraction was in the range of 45% to 50%. Abnormal GLPSS  at -13% with inferoseptal strain abnormality. The study is not  technically sufficient to allow evaluation of LV diastolic  function. - Mitral valve: Mildly thickened leaflets . There was trivial  regurgitation. - Left atrium: Severely dilated at 51 ml/m2. - Right ventricle: The cavity size was mildly dilated. - Right atrium: The atrium was mildly dilated. - Tricuspid valve: There was trivial regurgitation. - Pulmonary arteries: PA peak pressure: 21 mm Hg (S). - Inferior vena cava: The vessel was normal in size. The  respirophasic diameter changes were in the normal range (>= 50%),  consistent with normal central venous pressure. Impressions: - Compared to the  most recent echo in 08/2015, the LVEF is slightly  improved at 45-50% wtih inferoseptal wall motion and strain  abnormalities.  ECG afib 121 with QT 345 and QTc [place] of 410  Assessment and Plan:   1. Persistent Afib admit for Tikosyn initiation     CHA2DS2Vasc is 3 on Xarelto     K+ 4.1     Mag 2.0     Creat 1.25 (Calc. Cr. Cl is 52.09), start dose is 272mg BID     Plan for DCCV Wed if not in SR     EKG reviewed with Dr. KCaryl Comes QTc stable to proceed  2. CAD     Hx CABG     no CP  3. HTN     Stable  4. COPD     Stable, he denies SOB      5. Mild CM     Exam is compensated   Signed, RTommye Standard PA-C 01/10/2016 2:32 PM   Very symptomatic atrial fibrillation with dyspnea and presyncope. S/p CABG with mldly impaired (45-50%) LV function and severe LAE (39/2.1/50) admitted for dofetilide intiation PE as noted Risks reviewed  Anticipate DCCV on Wed if not spontaneous cardioversion

## 2016-01-10 NOTE — Progress Notes (Signed)
UR COMPLETED  

## 2016-01-10 NOTE — Progress Notes (Signed)
Pharmacy Review for Dofetilide (Tikosyn) Initiation  Admit Complaint: 70 y.o. male admitted 01/10/2016 with atrial fibrillation to be initiated on dofetilide.   Assessment:  Patient Exclusion Criteria: If any screening criteria checked as "Yes", then  patient  should NOT receive dofetilide until criteria item is corrected. If "Yes" please indicate correction plan.  YES  NO Patient  Exclusion Criteria Correction Plan  []  [x]  Baseline QTc interval is greater than or equal to 440 msec. IF above YES box checked dofetilide contraindicated unless patient has ICD; then may proceed if QTc 500-550 msec or with known ventricular conduction abnormalities may proceed with QTc 550-600 msec. QTc = 0.4   []  [x]  Magnesium level is less than 1.8 mEq/l : Last magnesium:  Lab Results  Component Value Date   MG 2.0 01/10/2016         []  [x]  Potassium level is less than 4 mEq/l : Last potassium:  Lab Results  Component Value Date   K 4.1 01/10/2016         []  [x]  Patient is known or suspected to have a digoxin level greater than 2 ng/ml: No results found for: DIGOXIN    []  [x]  Creatinine clearance less than 20 ml/min (calculated using Cockcroft-Gault, actual body weight and serum creatinine): Estimated Creatinine Clearance: 46.5 mL/min (by C-G formula based on Cr of 1.25).  Calculated crcl ~52 ml/min  []  [x]  Patient has received drugs known to prolong the QT intervals within the last 48 hours (phenothiazines, tricyclics or tetracyclic antidepressants, erythromycin, H-1 antihistamines, cisapride, fluoroquinolones, azithromycin). Drugs not listed above may have an, as yet, undetected potential to prolong the QT interval, updated information on QT prolonging agents is available at this website:QT prolonging agents   []  [x]  Patient received a dose of hydrochlorothiazide (Oretic) alone or in any combination including triamterene (Dyazide, Maxzide) in the last 48 hours.   []  [x]  Patient received a medication  known to increase dofetilide plasma concentrations prior to initial dofetilide dose:  . Trimethoprim (Primsol, Proloprim) in the last 36 hours . Verapamil (Calan, Verelan) in the last 36 hours or a sustained release dose in the last 72 hours . Megestrol (Megace) in the last 5 days  . Cimetidine (Tagamet) in the last 6 hours . Ketoconazole (Nizoral) in the last 24 hours . Itraconazole (Sporanox) in the last 48 hours  . Prochlorperazine (Compazine) in the last 36 hours    []  [x]  Patient is known to have a history of torsades de pointes; congenital or acquired long QT syndromes.   []  [x]  Patient has received a Class 1 antiarrhythmic with less than 2 half-lives since last dose. (Disopyramide, Quinidine, Procainamide, Lidocaine, Mexiletine, Flecainide, Propafenone)   []  [x]  Patient has received amiodarone therapy in the past 3 months or amiodarone level is greater than 0.3 ng/ml.    Patient has been appropriately anticoagulated with Xarelto.  Ordering provider was confirmed at [email or web address] if they are not listed on the [place] Prescribers list.  Goal of Therapy: Follow renal function, electrolytes, potential drug interactions, and dose adjustment. Provide education and 1 week supply at discharge.  Plan:  [x]   Physician selected initial dose within range recommended for patients level of renal function - will monitor for response.  []   Physician selected initial dose outside of range recommended for patients level of renal function - will discuss if the dose should be altered at this time.   [facility] One Calculated CrCl  Dose q12h  []  > 60 ml/min 500 mcg  [  x] 40-60 ml/min 250 mcg  []  20-40 ml/min 125 mcg   2. Follow up QTc after the first 5 doses, renal function, electrolytes (K & Mg) daily x 3     days, dose adjustment, success of initiation and facilitate 1 week discharge supply as     clinically indicated.  3. Initiate Tikosyn education video (Call [phone] and ask for video  # [phone]).  4. Patient also had a visit with pharmacist in Cardiology office this morning.  Arty Baumgartner , [place] Pager: [phone]  5:19 PM 01/10/2016

## 2016-01-10 NOTE — Progress Notes (Signed)
Patient ID: [name], male   DOB: [birth year], 70 y.o.   MRN: [id number]     Primary Care Physician: Mickie Hillier, MD Referring Physician: Dr. Lorra Hals [name] is a 70 y.o. male patient of Dr. Marlou Porch with a h/o persistent afib/flutter with evidence for SSS, heart rate in the 40's, when in SR, prompting stopping of BB who was seen in the Afib clinic on 01/03/16.  Pt was symptomatic with afib so plans for maintaining SR were discussed.  Multaq and flecainide were not options due to LVD and CAD.  Sotalol and amiodarone were not chosen due to chronotropic effects.  Best option was Tikosyn.  Unfortunately he does not have prescription insurance coverage so plan was to wait until he could afford the [place].  Pt called back to the office 2 days later after having 2 bad episodes of afib.  He was willing to start Tikosyn despite the cost.    Pt is accompanied by his wife.  We discussed potential side effects of Tikosyn including QTc prolongation.  He is aware of the importance of compliance and will call the office with any missed doses of Tikosyn.  Reviewed pt's medication list.  He is currently not taking any QTc prolongating or contraindicated medications. He is appropriately anticoagulated with Xarelto and has not missed any doses within the past 4 weeks.    EKG reviewed by Dr. Rayann Heman showed Aflutter with variable AV blcok.  Vent rate 81 bpm.  QTC 411 msec.    Past Medical History  Diagnosis Date  . Hypertension   . CAD (coronary artery disease)   . Headache(784.0)   . COPD (chronic obstructive pulmonary disease) ([place])   . Gastritis   . Bilateral carotid artery disease ([place]) 07/21/2013   Past Surgical History  Procedure Laterality Date  . Coronary artery bypass graft    . Incision and drainage deep neck abscess    . Tee without cardioversion N/A 08/02/2015    Procedure: TRANSESOPHAGEAL ECHOCARDIOGRAM (TEE);  Surgeon: Sueanne Margarita, MD;  Location: [place];  Service:  Cardiovascular;  Laterality: N/A;  . Cardioversion N/A 09/08/2015    Procedure: CARDIOVERSION;  Surgeon: Thayer Headings, MD;  Location[facility] ENDOSCOPY;  Service: Cardiovascular;  Laterality: N/A;  . Tee without cardioversion N/A 09/08/2015    Procedure: TRANSESOPHAGEAL ECHOCARDIOGRAM (TEE);  Surgeon: Thayer Headings, MD;  Location[facility] ENDOSCOPY;  Service: Cardiovascular;  Laterality: N/A;    Current Outpatient Prescriptions  Medication Sig Dispense Refill  . acetaminophen (TYLENOL) 500 MG tablet Take 1,000 mg by mouth daily as needed for headache.     . ALPRAZolam (XANAX) 0.25 MG tablet Take 0.25 mg by mouth at bedtime as needed for anxiety (Take 1/2-1 tablet twice a day as needed).    [name] atorvastatin (LIPITOR) 40 MG tablet Take 40 mg by mouth daily at 6 PM.    . furosemide (LASIX) 20 MG tablet Take 1 tablet (20 mg total) by mouth daily. 30 tablet 6  . lisinopril (PRINIVIL,ZESTRIL) 20 MG tablet Take 1 tablet (20 mg total) by mouth 2 (two) times daily. 180 tablet 1  . metoprolol tartrate (LOPRESSOR) 25 MG tablet Take 1.5 tablets (37.5 mg total) by mouth 2 (two) times daily. 90 tablet 6  . Multiple Vitamins-Minerals (MULTIVITAMIN WITH MINERALS) tablet Take 1 tablet by mouth daily.    . nitroGLYCERIN (NITROSTAT) 0.4 MG SL tablet Place 0.4 mg under the tongue every 5 (five) minutes as needed for chest pain (x 3  doses).    . Omega-3 Fatty Acids (FISH OIL) 1200 MG CAPS Take 1 capsule by mouth daily.     . rivaroxaban (XARELTO) 20 MG TABS tablet Take 1 tablet (20 mg total) by mouth daily with supper. 30 tablet 11  . vitamin C (ASCORBIC ACID) 500 MG tablet Take 500 mg by mouth daily.     No current facility-administered medications for this visit.    Allergies  Allergen Reactions  . Doxycycline Shortness Of Breath    Also has to urinate more often  . Nsaids Hypertension    Gastritis     Assessment and Plan: 1. Persistent afib/flutter Reviewed pt's labs.  Potassium 4.1, Mg 2.0.  QTc < 440 msec.   Okay to start Tikosyn.  SCr- 1.25; CrCl- 53 mL/min.  Would start Tikosyn at 212mcg BID.  Pt aware to report to hospital for admission.

## 2016-01-10 NOTE — Care Management Important Message (Signed)
Important Message  Patient Details  Name: [name] MRN: [id number] Date of Birth: February 07, 1946   Medicare Important Message Given:  Yes    Sharin Mons, RN 01/10/2016, 2:21 PM

## 2016-01-11 ENCOUNTER — Ambulatory Visit: Payer: Medicare Other | Admitting: Cardiology

## 2016-01-11 DIAGNOSIS — I255 Ischemic cardiomyopathy: Secondary | ICD-10-CM

## 2016-01-11 DIAGNOSIS — I10 Essential (primary) hypertension: Secondary | ICD-10-CM

## 2016-01-11 LAB — BASIC METABOLIC PANEL
ANION GAP: 13 (ref 5–15)
BUN: 18 mg/dL (ref 6–20)
CHLORIDE: 104 mmol/L (ref 101–111)
CO2: 23 mmol/L (ref 22–32)
Calcium: 9 mg/dL (ref 8.9–10.3)
Creatinine, Ser: 1.05 mg/dL (ref 0.61–1.24)
Glucose, Bld: 94 mg/dL (ref 65–99)
POTASSIUM: 3.9 mmol/L (ref 3.5–5.1)
SODIUM: 140 mmol/L (ref 135–145)

## 2016-01-11 LAB — MAGNESIUM: MAGNESIUM: 2.1 mg/dL (ref 1.7–2.4)

## 2016-01-11 MED ORDER — DOFETILIDE 500 MCG PO CAPS
500.0000 ug | ORAL_CAPSULE | Freq: Two times a day (BID) | ORAL | Status: DC
  Administered 2016-01-11 – 2016-01-13 (×5): 500 ug via ORAL
  Filled 2016-01-11 (×5): qty 1

## 2016-01-11 MED ORDER — SODIUM CHLORIDE 0.9% FLUSH
3.0000 mL | Freq: Two times a day (BID) | INTRAVENOUS | Status: DC
  Administered 2016-01-11 (×2): 3 mL via INTRAVENOUS

## 2016-01-11 MED ORDER — METOPROLOL TARTRATE 25 MG PO TABS
25.0000 mg | ORAL_TABLET | Freq: Two times a day (BID) | ORAL | Status: DC
  Filled 2016-01-11: qty 1

## 2016-01-11 MED ORDER — POTASSIUM CHLORIDE CRYS ER 20 MEQ PO TBCR
20.0000 meq | EXTENDED_RELEASE_TABLET | Freq: Once | ORAL | Status: AC
  Administered 2016-01-11: 20 meq via ORAL
  Filled 2016-01-11: qty 1

## 2016-01-11 MED ORDER — SODIUM CHLORIDE 0.9% FLUSH: 3.0000 mL | INTRAVENOUS | Status: DC | PRN

## 2016-01-11 MED ORDER — SODIUM CHLORIDE 0.9 % IV SOLN: 250.0000 mL | INTRAVENOUS | Status: DC

## 2016-01-11 MED ORDER — HYDROCORTISONE 1 % EX CREA: 1.0000 "application " | TOPICAL_CREAM | Freq: Three times a day (TID) | CUTANEOUS | Status: DC | PRN

## 2016-01-11 NOTE — Progress Notes (Signed)
Report received in patient's room via Dawn/Shanell RN using SBAR format, reviewed orders, labs, VS, meds and patient's general condition, assumed care of patient.

## 2016-01-11 NOTE — Progress Notes (Signed)
   SUBJECTIVE: The patient is doing well today.  At this time, he denies chest pain, shortness of breath, or any new concerns.  . ALPRAZolam  0.25 mg Oral QHS  . atorvastatin  40 mg Oral QHS  . dofetilide  500 mcg Oral BID  . furosemide  20 mg Oral Daily  . lisinopril  20 mg Oral BID  . metoprolol tartrate  37.5 mg Oral BID  . rivaroxaban  20 mg Oral Q supper  . sodium chloride flush  3 mL Intravenous Q12H      OBJECTIVE: Physical Exam: Filed Vitals:   01/10/16 1332 01/10/16 2109 01/10/16 2245 01/11/16 0500  BP: 111/90 162/88 162/88 119/76  Pulse: 129 85 84   Temp: 97.9 F (36.6 C) 97.9 F (36.6 C)  97.9 F (36.6 C)  TempSrc: Oral Oral  Oral  Resp:    16  Height: 5\' 5"  (1.651 m)     Weight: 152 lb 11.2 oz (69.264 kg)   148 lb 11.2 oz (67.45 kg)  SpO2: 94% 99%  95%    Intake/Output Summary (Last 24 hours) at 01/11/16 0753 Last data filed at 01/11/16 0400  Gross per 24 hour  Intake    840 ml  Output    700 ml  Net    140 ml    Telemetry reveals rate controlled AF, rare PVCs  GEN- The patient is well appearing, alert and oriented x 3 today.   Head- normocephalic, atraumatic Eyes-  Sclera clear, conjunctiva pink Ears- hearing intact Oropharynx- clear Neck- supple,  Lungs- Clear to ausculation bilaterally, normal work of breathing Heart- irregular rate and rhythm, no murmurs, rubs or gallops, PMI not laterally displaced GI- soft, NT, ND, + BS Extremities- no clubbing, cyanosis, or edema Skin- no rash or lesion Psych- euthymic mood, full affect Neuro- strength and sensation are intact  LABS: Basic Metabolic Panel:  Recent Labs  01/10/16 0950 01/11/16 0508  NA 141 140  K 4.1 3.9  CL 103 104  CO2 29 23  GLUCOSE 97 94  BUN 16 18  CREATININE 1.25* 1.05  CALCIUM 9.5 9.0  MG 2.0 2.1   ASSESSMENT AND PLAN:  1. Persistent atrial fibrillation Currently being started on tikosyn. CrCL is 60. Will therefore increase tikosyn to 500 mcg BID and follow  closely Continue xarelto chads2vasc score is 4 If still in AF tomorrow, would anticipate cardioversion  2. HTN Stable No change required today  3. CAD No ischemic symptoms  4. Ischemic CM Continue current medicine regimen  Thompson Grayer, MD 01/11/2016 7:53 AM

## 2016-01-11 NOTE — Care Management Note (Addendum)
Case Management Note  Patient Details  Name: [name] MRN: [id number] Date of Birth: Oct 20, 1945  Subjective/Objective:       Pt admitted for Tikosyn Load.              Action/Plan: Benefits check in process and will make pt aware of cost once completed. Pt will need a Rx for 7 day supply no refills- CM will assist via [place]. Pt will need an additional Rx for 30 day supply with refills. No further needs at this time.    Expected Discharge Date:                  Expected Discharge Plan:  Home/Self Care  In-House Referral:  NA  Discharge planning Services  CM Consult, Medication Assistance  Post Acute Care Choice:  NA Choice offered to:  NA  DME Arranged:  N/A DME Agency:  NA  HH Arranged:  NA HH Agency:  NA  Status of Service:  Completed, signed off  Medicare Important Message Given:  Yes Date Medicare IM Given:    Medicare IM give by:    Date Additional Medicare IM Given:    Additional Medicare Important Message give by:     If discussed at [place] of Stay Meetings, dates discussed:    Additional Comments: 1045  01-12-16 Jacqlyn Krauss, RN, BSN [phone] [name]. Pt is aware of the penalty without part D coverage. [name] CM did call the [facility] in [place] and the medication can be ordered. CM will assist with the 7 day Rx. via the [place]. Cost for cash price generic will be $422.90, Brand Price: $520.64- CM will place patient assistance forms on the chart. [name] MD please fill the forms out in designated area.  No further needs at this time.  Bethena Roys, RN 01/11/2016, 2:22 PM

## 2016-01-11 NOTE — Plan of Care (Signed)
Problem: Education: Goal: Knowledge of [place] [facility] Education information/materials will improve Outcome: Progressing Patient viewed video on Tikosyn and all questions answered, will continue to monitor

## 2016-01-11 NOTE — Progress Notes (Signed)
Pt converted to SB, 10:34. EP paged and made aware.

## 2016-01-12 ENCOUNTER — Encounter (HOSPITAL_COMMUNITY)
Admission: AD | Disposition: A | Discharge status: Home / Self Care | Payer: Self-pay | Source: Ambulatory Visit | Attending: Internal Medicine

## 2016-01-12 DIAGNOSIS — Z5181 Encounter for therapeutic drug level monitoring: Secondary | ICD-10-CM

## 2016-01-12 DIAGNOSIS — Z79899 Other long term (current) drug therapy: Secondary | ICD-10-CM

## 2016-01-12 LAB — BASIC METABOLIC PANEL
Anion gap: 11 (ref 5–15)
BUN: 24 mg/dL — ABNORMAL HIGH (ref 6–20)
CALCIUM: 8.9 mg/dL (ref 8.9–10.3)
CO2: 24 mmol/L (ref 22–32)
CREATININE: 1.38 mg/dL — AB (ref 0.61–1.24)
Chloride: 104 mmol/L (ref 101–111)
GFR calc Af Amer: 57 mL/min — ABNORMAL LOW (ref >60)
GFR calc non Af Amer: 50 mL/min — ABNORMAL LOW (ref >60)
GLUCOSE: 103 mg/dL — AB (ref 65–99)
Potassium: 4.2 mmol/L (ref 3.5–5.1)
Sodium: 139 mmol/L (ref 135–145)

## 2016-01-12 LAB — MAGNESIUM: Magnesium: 2.2 mg/dL (ref 1.7–2.4)

## 2016-01-12 SURGERY — CARDIOVERSION (CATH LAB)

## 2016-01-12 MED ORDER — OFF THE BEAT BOOK
Freq: Once | Status: AC
  Administered 2016-01-12: 13:00:00
  Filled 2016-01-12: qty 1

## 2016-01-12 MED ORDER — METOPROLOL TARTRATE 12.5 MG HALF TABLET
12.5000 mg | ORAL_TABLET | Freq: Two times a day (BID) | ORAL | Status: DC
  Administered 2016-01-12 – 2016-01-13 (×2): 12.5 mg via ORAL
  Filled 2016-01-12 (×2): qty 1

## 2016-01-12 NOTE — Progress Notes (Addendum)
Wrong note on pt.

## 2016-01-12 NOTE — Plan of Care (Signed)
Problem: Education: Goal: Knowledge of disease or condition will improve Outcome: Progressing " off the beat " booklet given to pt. Risk factors, medication, stroke prevention, anticoagulation and pathophysiology reviewed with pt. Pt has no further questions at this time. States he will review the booklet. Pt was sinus brady in am, metoprolol held, EP aware, now in NSR. Pt has no pain.Will continue to monitor.

## 2016-01-12 NOTE — Progress Notes (Signed)
Asked to call patient's wife to discuss patient's niece who passed away unexpectedly.  Left message, will try to speak with her tomorrow.   Chanetta Marshall, NP 01/12/2016 4:24 PM

## 2016-01-12 NOTE — Progress Notes (Signed)
   SUBJECTIVE: The patient is doing well today.  At this time, he denies chest pain, shortness of breath, or any new concerns.  CURRENT MEDICATIONS: . ALPRAZolam  0.25 mg Oral QHS  . atorvastatin  40 mg Oral QHS  . dofetilide  500 mcg Oral BID  . lisinopril  20 mg Oral BID  . metoprolol tartrate  25 mg Oral BID  . rivaroxaban  20 mg Oral Q supper  . sodium chloride flush  3 mL Intravenous Q12H   . sodium chloride      OBJECTIVE: Physical Exam: Filed Vitals:   01/11/16 1426 01/11/16 2114 01/11/16 2123 01/12/16 0538  BP: 118/77  121/73 124/78  Pulse: 88 52 65 60  Temp: 98 F (36.7 C)  97.9 F (36.6 C) 97.6 F (36.4 C)  TempSrc: Oral  Oral Oral  Resp: 17   18  Height:      Weight:    152 lb 8 oz (69.174 kg)  SpO2: 98%  96% 97%    Intake/Output Summary (Last 24 hours) at 01/12/16 0757 Last data filed at 01/11/16 1910  Gross per 24 hour  Intake    930 ml  Output    600 ml  Net    330 ml    Telemetry reveals sinus rhythm, rare PVC's   GEN- The patient is well appearing, alert and oriented x 3 today.   Head- normocephalic, atraumatic Eyes-  Sclera clear, conjunctiva pink Ears- hearing intact Oropharynx- clear Neck- supple,  Lungs- Clear to ausculation bilaterally, normal work of breathing Heart- Regular rate and rhythm, no murmurs, rubs or gallops  GI- soft, NT, ND, + BS Extremities- no clubbing, cyanosis, or edema Skin- no rash or lesion Psych- euthymic mood, full affect Neuro- strength and sensation are intact  LABS: Basic Metabolic Panel:  Recent Labs  01/11/16 0508 01/12/16 0353  NA 140 139  K 3.9 4.2  CL 104 104  CO2 23 24  GLUCOSE 94 103*  BUN 18 24*  CREATININE 1.05 1.38*  CALCIUM 9.0 8.9  MG 2.1 2.2   ASSESSMENT AND PLAN:  1. Persistent atrial fibrillation Admitted for tikosyn 01/10/16 CrCl 47 this morning. He was started on Lasix when he developed AF - will discontinue now that in SR.  BMET in am.  If he has persistent renal insufficiency  or QT lengthens, will need to decrease Tikosyn dose Discussed with Dr Rayann Heman - as patient already received Tikosyn dose this morning, will leave on 515mcg twice daily for now.  Continue xarelto for chads2vasc score of 4  2. HTN Stable No change required today  3. CAD No ischemic symptoms  4. Ischemic CM Continue current medicine regimen  Chanetta Marshall, NP 01/12/2016 8:10 AM    Thompson Grayer MD, [facility] 01/12/2016

## 2016-01-12 NOTE — Plan of Care (Signed)
Problem: Activity: Goal: Ability to tolerate increased activity will improve Outcome: Completed/Met Date Met:  01/12/16 Patient maintaining sinus bradycardia versus normal sinus rhythm with frequent PACs on telemetry. Tolerating increasing activity in room without symptoms.  QTc before Tikosyn administration last evening was 400 msec, increased to 480 msec by 12-lead EKG after dose. Due to bradycardia, evening dose of metoprolol was held (see MAR). No ectopy noted on telemetry overnight.  Recent vital signs are stable: Filed Vitals:    01/11/16 1049 01/11/16 1426 01/11/16 2114 01/11/16 2123  BP: 117/65 118/77   121/73  Pulse:   88 52 65  Temp:   98 F (36.7 C)   97.9 F (36.6 C)  TempSrc:   Oral   Oral  Resp:   17      Height:          Weight:          SpO2:   98%   96%     Education continues per plan of care (see educational record).  Continuing to monitor.

## 2016-01-13 ENCOUNTER — Other Ambulatory Visit: Payer: Self-pay | Admitting: *Deleted

## 2016-01-13 DIAGNOSIS — N182 Chronic kidney disease, stage 2 (mild): Secondary | ICD-10-CM

## 2016-01-13 LAB — MAGNESIUM: Magnesium: 2 mg/dL (ref 1.7–2.4)

## 2016-01-13 LAB — BASIC METABOLIC PANEL
ANION GAP: 8 (ref 5–15)
BUN: 21 mg/dL — AB (ref 6–20)
CHLORIDE: 104 mmol/L (ref 101–111)
CO2: 28 mmol/L (ref 22–32)
Calcium: 9.1 mg/dL (ref 8.9–10.3)
Creatinine, Ser: 1.32 mg/dL — ABNORMAL HIGH (ref 0.61–1.24)
GFR, EST NON AFRICAN AMERICAN: 52 mL/min — AB (ref >60)
Glucose, Bld: 104 mg/dL — ABNORMAL HIGH (ref 65–99)
POTASSIUM: 4.5 mmol/L (ref 3.5–5.1)
SODIUM: 140 mmol/L (ref 135–145)

## 2016-01-13 MED ORDER — LISINOPRIL 20 MG PO TABS: 20.0000 mg | ORAL_TABLET | Freq: Every day | ORAL | Status: DC

## 2016-01-13 MED ORDER — DOFETILIDE 500 MCG PO CAPS: 500.0000 ug | ORAL_CAPSULE | Freq: Two times a day (BID) | ORAL | Status: DC

## 2016-01-13 NOTE — Patient Outreach (Signed)
[facility]) Care Management  01/13/2016  ELRY SLUYTER 1945-12-03 [id number]  [name] was discharged from the hospital today after admission on 01/10/16 for persistent afib > Tikosyn load. Mr. Pacheco also has COPD. [facility] Care Management was consulted and Mr. Forbush was referred to our community clinical case management program for transition of care follow up on medication assistance for Tikosyn as he has no Part D prescription benefit/is paying for medications out of pocket.  I will reach out to Mr. Pinal in the morning for a transition of care assessment and assure that pharmacy has been consulted.    [place] Management  [phone]

## 2016-01-13 NOTE — Discharge Summary (Signed)
ELECTROPHYSIOLOGY PROCEDURE DISCHARGE SUMMARY    Patient ID: [name],  MRN: [id number], DOB/AGE: 02/15/1946 70 y.o.  Admit date: 01/10/2016 Discharge date: 01/13/2016  Primary Care Physician: Mickie Hillier, MD Primary Cardiologist: [facility]  Primary Discharge Diagnosis:  1.  Persistent atrial fibrillation status post Tikosyn loading this admission  Secondary Discharge Diagnosis:  1.  CAD 2.  SSS 3.  COPD 4.  Carotid artery disease 5.  HTN  Allergies  Allergen Reactions  . Doxycycline Shortness Of Breath    Also has to urinate more often  . Nsaids Hypertension    Gastritis      Procedures This Admission:  1.  Tikosyn loading.   Brief HPI: [name] is a 70 y.o. male with a past medical history as noted above.  They were referred to EP in the outpatient setting for treatment options of atrial fibrillation.  Risks, benefits, and alternatives to Tikosyn were reviewed with the patient who wished to proceed.    Hospital Course:  The patient was admitted and Tikosyn was initiated.  Renal function and electrolytes were followed during the hospitalization.  Their QTc remained stable. Renal function was elevated and his Lasix was discontinued.  Lisinopril was also decreased to once daily.  Metoprolol was discontinued 2/2 bradycardia. Per Dr Rayann Heman, will continue on Tikosyn 584mcg at discharge with plans for repeating BMET on Monday since we are planning on discontinuing Lasix and decreasing Lisinopril. They were monitored until discharge on telemetry which demonstrated sinus rhythm with PAC's.  On the day of discharge, they were examined by Dr Rayann Heman who considered them stable for discharge to home.  Follow-up has been arranged with [name], NP on Monday to re-evaluate BMET.  If CrCl remains decreased despite stopping Lasix and decreasing Lisinopril, will need to decrease Tikosyn dose.   Physical Exam: Filed Vitals:   01/12/16 1430 01/12/16 1714 01/12/16 2217  01/13/16 0501  BP: 117/64 136/73 140/76 129/69  Pulse: 61 64 50 61  Temp: 97.5 F (36.4 C) 98.2 F (36.8 C) 97.9 F (36.6 C) 98 F (36.7 C)  TempSrc: Oral Oral Oral Oral  Resp: 18 18    Height:      Weight:    148 lb 12.8 oz (67.495 kg)  SpO2: 95% 97% 94% 96%    GEN- The patient is elderly appearing, alert and oriented x 3 today.   HEENT: normocephalic, atraumatic; sclera clear, conjunctiva pink; hearing intact; oropharynx clear; neck supple  Lungs- Clear to ausculation bilaterally, normal work of breathing.  No wheezes, rales, rhonchi Heart- Regular rate and rhythm  GI- soft, non-tender, non-distended, bowel sounds present  Extremities- no clubbing, cyanosis, or edema  MS- no significant deformity or atrophy Skin- warm and dry, no rash or lesion Psych- euthymic mood, full affect Neuro- strength and sensation are intact   Labs:   Lab Results  Component Value Date   WBC 8.7 12/06/2015   HGB 13.8 12/06/2015   HCT 42.3 12/06/2015   MCV 93.6 12/06/2015   PLT 253 12/06/2015     Recent Labs Lab 01/13/16 0234  NA 140  K 4.5  CL 104  CO2 28  BUN 21*  CREATININE 1.32*  CALCIUM 9.1  GLUCOSE 104*     Discharge Medications:    Medication List    STOP taking these medications        furosemide 20 MG tablet  Commonly known as:  LASIX     metoprolol tartrate 25 MG tablet  Commonly known as:  LOPRESSOR      TAKE these medications        acetaminophen 500 MG tablet  Commonly known as:  TYLENOL  Take 1,000 mg by mouth daily as needed for headache (pain).     ALPRAZolam 0.25 MG tablet  Commonly known as:  XANAX  Take 0.25 mg by mouth at bedtime.     atorvastatin 40 MG tablet  Commonly known as:  LIPITOR  Take 40 mg by mouth at bedtime.     dofetilide 500 MCG capsule  Commonly known as:  TIKOSYN  Take 1 capsule (500 mcg total) by mouth 2 (two) times daily.     Fish Oil 1200 MG Caps  Take 1,200 mg by mouth at bedtime.     lisinopril 20 MG tablet    Commonly known as:  PRINIVIL,ZESTRIL  Take 1 tablet (20 mg total) by mouth daily.     multivitamin with minerals tablet  Take 1 tablet by mouth at bedtime.     nitroGLYCERIN 0.4 MG SL tablet  Commonly known as:  NITROSTAT  Place 0.4 mg under the tongue every 5 (five) minutes as needed for chest pain (x 3 doses).     rivaroxaban 20 MG Tabs tablet  Commonly known as:  XARELTO  Take 1 tablet (20 mg total) by mouth daily with supper.     vitamin C 500 MG tablet  Commonly known as:  ASCORBIC ACID  Take 500 mg by mouth at bedtime.        Disposition:  Discharge Instructions    Diet - low sodium heart healthy    Complete by:  As directed      Increase activity slowly    Complete by:  As directed           Follow-up Information    Follow up with [facility] On 01/17/2016.   Specialty:  Cardiology   Why:  at [facility] information:   [address] [id number] [place] [phone]      Duration of Discharge Encounter: Greater than 30 minutes including physician time.  Signed, Chanetta Marshall, NP 01/13/2016 9:58 AM   I have seen, examined the patient, and reviewed the above assessment and plan.  Changes to above are made where necessary.    Co Sign: Thompson Grayer, MD 01/13/2016 10:06 AM

## 2016-01-13 NOTE — Consult Note (Signed)
   [facility] CM Inpatient Consult   01/13/2016  [name] 03/26/1946 [id number]   Patient evaluated for long-term disease management services with [place] Management program. Martin Majestic to bedside to discuss and offer [place] Management services with [name] and wife. He is agreeable and written consent signed. Explained to patient that he will receive post hospital transition of care calls and will be evaluated for monthly home visits. Confirmed Primary Care MD as Dr. Wolfgang Phoenix.  Confirmed best contact number as [phone]. Left [facility] Care Management packet and contact information at bedside. Made inpatient RNCM aware that [name] will be followed by [place] Management post hospital discharge.  Discussed [facility] Care Management follow up for COPD disease and symptom management. He is currently in hospital for AFIB and the initiation for Tikosyn therapy. [name] does not currently have a Part D prescription benefit. He states he was not aware that he needed to sign up for it before. He plans on signing up for Part D prescription benefit in October however. In the meantime, he has paid out of pocket for his medications. Discussed [place] [facility] pharmacy follow up as well for possible assistance with his medications. His Tikosyn will cost around four hundred dollars or so. Spoke with inpatient RNCM who indicates she provided patient assistance form for Tikosyn to see if the [place] can provide assistance. Patient will need to send in some additional information as well. Will request [place] follow up for this. Will request for inpatient RNCM for transition of care. Has AFIB and history of COPD. [name] states his goal is to continue to work as long as he can. 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 Discussed all of the above with inpatient RNCM. Mr. Fennewald expressed appreciation of visit.   [name], MSN-Ed, RN,BSN [facility] Liaison [phone]

## 2016-01-13 NOTE — Care Management Important Message (Signed)
Important Message  Patient Details  Name: [name] MRN: [id number] Date of Birth: 1946-03-21   Medicare Important Message Given:  Yes    Bethena Roys, RN 01/13/2016, 11:14 AM

## 2016-01-14 ENCOUNTER — Other Ambulatory Visit: Payer: Self-pay | Admitting: *Deleted

## 2016-01-14 NOTE — Patient Outreach (Signed)
[facility]) Care Management  01/14/2016  [name] 1946-02-24 [id number]  I reached out to [name] this afternoon by phone to follow up on his recent hospitalization and perform a transition of care assessment. I was unable to speak with [name] but left a HIPPA compliant voice message requesting a return call.   I noted that [name] does have an appointment on Monday 01/17/16  @ 2pm in the Afib Clinic and has a previously scheduled primary care appointment scheduled for 02/04/16 @ 9:30am with [name].   Plan: I will reach out to [name] again on Tuesday of next week, given that he will be in the afib clinic on Monday.    [place] Management  [phone]

## 2016-01-17 ENCOUNTER — Encounter (HOSPITAL_COMMUNITY): Payer: Self-pay | Admitting: Nurse Practitioner

## 2016-01-17 ENCOUNTER — Ambulatory Visit (HOSPITAL_COMMUNITY)
Admission: RE | Admit: 2016-01-17 | Discharge: 2016-01-17 | Disposition: A | Discharge status: Home / Self Care | Payer: Medicare Other | Source: Ambulatory Visit | Attending: Nurse Practitioner | Admitting: Nurse Practitioner

## 2016-01-17 VITALS — BP 140/98 | HR 125 | Ht 65.0 in | Wt 154.0 lb

## 2016-01-17 DIAGNOSIS — Z87891 Personal history of nicotine dependence: Secondary | ICD-10-CM | POA: Diagnosis not present

## 2016-01-17 DIAGNOSIS — J449 Chronic obstructive pulmonary disease, unspecified: Secondary | ICD-10-CM | POA: Insufficient documentation

## 2016-01-17 DIAGNOSIS — Z79899 Other long term (current) drug therapy: Secondary | ICD-10-CM | POA: Insufficient documentation

## 2016-01-17 DIAGNOSIS — I510 Cardiac septal defect, acquired: Secondary | ICD-10-CM | POA: Insufficient documentation

## 2016-01-17 DIAGNOSIS — Z9889 Other specified postprocedural states: Secondary | ICD-10-CM | POA: Insufficient documentation

## 2016-01-17 DIAGNOSIS — Z7901 Long term (current) use of anticoagulants: Secondary | ICD-10-CM | POA: Insufficient documentation

## 2016-01-17 DIAGNOSIS — R9431 Abnormal electrocardiogram [ECG] [EKG]: Secondary | ICD-10-CM | POA: Insufficient documentation

## 2016-01-17 DIAGNOSIS — I2510 Atherosclerotic heart disease of native coronary artery without angina pectoris: Secondary | ICD-10-CM | POA: Insufficient documentation

## 2016-01-17 DIAGNOSIS — Z888 Allergy status to other drugs, medicaments and biological substances status: Secondary | ICD-10-CM | POA: Diagnosis not present

## 2016-01-17 DIAGNOSIS — I481 Persistent atrial fibrillation: Secondary | ICD-10-CM | POA: Diagnosis not present

## 2016-01-17 DIAGNOSIS — I10 Essential (primary) hypertension: Secondary | ICD-10-CM | POA: Diagnosis not present

## 2016-01-17 DIAGNOSIS — Z8249 Family history of ischemic heart disease and other diseases of the circulatory system: Secondary | ICD-10-CM | POA: Diagnosis not present

## 2016-01-17 DIAGNOSIS — I4892 Unspecified atrial flutter: Secondary | ICD-10-CM | POA: Diagnosis not present

## 2016-01-17 DIAGNOSIS — I4589 Other specified conduction disorders: Secondary | ICD-10-CM | POA: Insufficient documentation

## 2016-01-17 DIAGNOSIS — Z833 Family history of diabetes mellitus: Secondary | ICD-10-CM | POA: Insufficient documentation

## 2016-01-17 LAB — BASIC METABOLIC PANEL
Anion gap: 7 (ref 5–15)
BUN: 11 mg/dL (ref 6–20)
CALCIUM: 9.6 mg/dL (ref 8.9–10.3)
CO2: 27 mmol/L (ref 22–32)
CREATININE: 1.21 mg/dL (ref 0.61–1.24)
Chloride: 108 mmol/L (ref 101–111)
GFR calc Af Amer: >60 mL/min (ref >60)
GFR, EST NON AFRICAN AMERICAN: 58 mL/min — AB (ref >60)
Glucose, Bld: 81 mg/dL (ref 65–99)
POTASSIUM: 5.2 mmol/L — AB (ref 3.5–5.1)
SODIUM: 142 mmol/L (ref 135–145)

## 2016-01-17 LAB — MAGNESIUM: MAGNESIUM: 2.3 mg/dL (ref 1.7–2.4)

## 2016-01-17 NOTE — Patient Instructions (Addendum)
Take 25mg  of metoprolol today then call in morning with readings

## 2016-01-17 NOTE — Progress Notes (Addendum)
Patient ID: [name], male   DOB: 1946-01-01, 70 y.o.   MRN: [id number]    Primary Care Physician: Mickie Hillier, MD Referring Physician: Dr. Elijah Birk is a 70 y.o. male with a h/o persistent afib/flutter with evidence for SSS, heart rate in the 40's, when in SR, prompting stopping of BB, f/u in the afib clinic today, for options for afib management. Echo obtained and shows a left atrium severely enlarged at 51 mm. EF mildly reduced at 45-50%. He reports that he feels well currently if HR stays controlled but one day he forgot BB dose and felt short of breath.Taking xarelto without fail for chadsvasc score of at least 4.  Discussed with Dr. Rayann Heman, and multaq/ flecainide not options due to LVD, CAD. Sotalol and amiodarone are not good options due to chronotropic effects. Genetic af not an option due to large amounts of bb involved with study, either bucindolol or metoprolol at 200 mg a day, which would cause issues with bradycardia/SSS. Dr. Rayann Heman does not want to pursue ablation with out trying at least one antiarrythmic and left atrial size may make him not an optimal candidate. Tikosyn may be an option but pt never signed up for medicate D and is paying $400 a month for eliquis out of pocket due to a stiff penalty. I talked to the SW and she said that at this point, the price of the penalty may be less  than having to pay out of pocket for both drugs. Pt will look into this with insurance agency. 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  Returns 5/22, after successful loading with tikosyn 5/15 thru 5/18.  QTc remained stable. Renal function was elevated and his Lasix was discontinued. Lisinopril was also decreased to once daily. Metoprolol was discontinued 2/2 bradycardia. Per Dr Rayann Heman, will continue on Tikosyn 568mcg at discharge with plans for repeating BMET on Monday  since we are planning on discontinuing Lasix and decreasing Lisinopril. He were monitored until discharge on telemetry which demonstrated sinus rhythm with PAC's. On the day of discharge, he was examined by Dr Rayann Heman who considered him stable for discharge to home. Follow-up was arranged today to re-evaluate BMET. Plans were if CrCl remains decreased despite stopping Lasix and decreasing Lisinopril, will need to decrease Tikosyn dose. Currently feels well without any awareness of afib. However he returns today with aflutter at 125 bpm. He was unaware he was in this rhythm until he was told. His heart rates at home have been in the 60's.  Today, he denies symptoms of palpitations, chest pain, shortness of breath, orthopnea, PND, lower extremity edema, dizziness, presyncope, syncope, or neurologic sequela. The patient is tolerating medications without difficulties and is otherwise without complaint today.   Past Medical History  Diagnosis Date  . Hypertension   . CAD (coronary artery disease)   . Headache(784.0)   . COPD (chronic obstructive pulmonary disease) ([place])   . Gastritis   . Bilateral carotid artery disease ([place]) 07/21/2013  . Atrial fibrillation ([place])   . Visit for monitoring Tikosyn therapy 12/2015   Past Surgical History  Procedure Laterality Date  . Coronary artery bypass graft    . Incision and drainage deep neck abscess    . Tee without cardioversion N/A 08/02/2015    Procedure: TRANSESOPHAGEAL ECHOCARDIOGRAM (TEE);  Surgeon: Sueanne Margarita, MD;  Location:  [facility];  Service: Cardiovascular;  Laterality: N/A;  . Cardioversion N/A 09/08/2015    Procedure: CARDIOVERSION;  Surgeon: Thayer Headings, MD;  Location: [facility];  Service: Cardiovascular;  Laterality: N/A;  . Tee without cardioversion N/A 09/08/2015    Procedure: TRANSESOPHAGEAL ECHOCARDIOGRAM (TEE);  Surgeon: Thayer Headings, MD;  Location: [facility];  Service: Cardiovascular;  Laterality: N/A;  . Rotator cuff  repair Bilateral     Current Outpatient Prescriptions  Medication Sig Dispense Refill  . acetaminophen (TYLENOL) 500 MG tablet Take 1,000 mg by mouth daily as needed for headache (pain).     [name] ALPRAZolam (XANAX) 0.25 MG tablet Take 0.25 mg by mouth at bedtime.     [name] atorvastatin (LIPITOR) 40 MG tablet Take 40 mg by mouth at bedtime.     . dofetilide (TIKOSYN) 500 MCG capsule Take 1 capsule (500 mcg total) by mouth 2 (two) times daily. 180 capsule 3  . lisinopril (PRINIVIL,ZESTRIL) 20 MG tablet Take 1 tablet (20 mg total) by mouth daily. 180 tablet 1  . Multiple Vitamins-Minerals (MULTIVITAMIN WITH MINERALS) tablet Take 1 tablet by mouth at bedtime.     . nitroGLYCERIN (NITROSTAT) 0.4 MG SL tablet Place 0.4 mg under the tongue every 5 (five) minutes as needed for chest pain (x 3 doses).    . Omega-3 Fatty Acids (FISH OIL) 1200 MG CAPS Take 1,200 mg by mouth at bedtime.     . rivaroxaban (XARELTO) 20 MG TABS tablet Take 1 tablet (20 mg total) by mouth daily with supper. (Patient taking differently: Take 20 mg by mouth daily after supper. ) 30 tablet 11  . vitamin C (ASCORBIC ACID) 500 MG tablet Take 500 mg by mouth at bedtime.      No current facility-administered medications for this encounter.    Allergies  Allergen Reactions  . Doxycycline Shortness Of Breath    Also has to urinate more often  . Nsaids Hypertension    Gastritis     Social History   Social History  . Marital Status: Married    Spouse Name: N/A  . Number of Children: N/A  . Years of Education: N/A   Occupational History  . Not on file.   Social History Main Topics  . Smoking status: Former Smoker    Quit date: 08/28/2006  . Smokeless tobacco: Never Used  . Alcohol Use: No     Comment: quit drinking in the 70's  . Drug Use: No  . Sexual Activity: Not on file   Other Topics Concern  . Not on file   Social History Narrative    Family History  Problem Relation Age of Onset  . Diabetes Mother   .  Heart attack Father   . Hypertension Mother   . Hypertension Sister   . Diabetes Sister   . Sudden death Father     ROS- All systems are reviewed and negative except as per the HPI above  Physical Exam: There were no vitals filed for this visit.  GEN- The patient is well appearing, alert and oriented x 3 today.   Head- normocephalic, atraumatic Eyes-  Sclera clear, conjunctiva pink Ears- hearing intact Oropharynx- clear Neck- supple, no JVP Lymph- no cervical lymphadenopathy Lungs- Clear to ausculation bilaterally, normal work of breathing Heart- regular rate and rhythm, no murmurs, rubs or gallops, PMI not laterally displaced GI- soft, NT, ND, + BS Extremities- no clubbing, cyanosis, or edema MS- no significant deformity or atrophy Skin- no rash or lesion Psych-  euthymic mood, full affect Neuro- strength and sensation are intact  EKG- aflutter at 125 bpm, 2:1 AV conduction, qrs int 64 ms, qtc 433 ms Echo-- Left ventricle: The cavity size was normal. Wall thickness was  normal. Systolic function was mildly reduced. The estimated  ejection fraction was in the range of 45% to 50%. Abnormal GLPSS  at -13% with inferoseptal strain abnormality. The study is not  technically sufficient to allow evaluation of LV diastolic  function. - Mitral valve: Mildly thickened leaflets . There was trivial  regurgitation. - Left atrium: Severely dilated at 51 ml/m2. - Right ventricle: The cavity size was mildly dilated. - Right atrium: The atrium was mildly dilated. - Tricuspid valve: There was trivial regurgitation. - Pulmonary arteries: PA peak pressure: 21 mm Hg (S). - Inferior vena cava: The vessel was normal in size. The  respirophasic diameter changes were in the normal range (>= 50%),  consistent with normal central venous pressure. [facility] records reviewed  Impressions:  - Compared to the most recent echo in 08/2015, the LVEF is slightly  improved at 45-50% wtih  inferoseptal wall motion and strain  abnormalities.   Bmet 5/18 with creatinine clearance at 1.32 and BUN at 21  Assessment and Plan: 1. Persistent afib/flutter  Successful tikosyn load but has aflutter with rvr today Take 25 mg of metoprolol when he gets home and tikosyn as scheduled this pm He will call the office in the am to report BP/HR and may be placed back on BB at 12.5 mg bid depending on HR Bmet pending to reassess renal function and tikosyn dose F/u Wednesday   Kylia Grajales C. Eileen Kangas, [facility] [address] [place], [place] [address] [phone]

## 2016-01-18 ENCOUNTER — Other Ambulatory Visit: Payer: Self-pay | Admitting: *Deleted

## 2016-01-18 DIAGNOSIS — I4819 Other persistent atrial fibrillation: Secondary | ICD-10-CM

## 2016-01-18 NOTE — Patient Outreach (Signed)
[facility]) Care Management  01/18/2016  [name] 07-Aug-1946 [id number]   Mr. [name] is a 70 year old male with history of recent hospitalization 01/10/16 - 01/13/16 for Tikosyn load for treatment of persistent afib/flutter with evidence for SSS. Mr. [name] was seen in follow up in the Afib Clinic on Monday. He did not sign up for Medicare Part D benefit and is experiencing financial barriers related to medication costs. Therefore, I have referred him to our pharmacy team for assistance.  Plan: I have been unable to reach Mr. [name] by phone but will continue to reach out to him.    [place] Management  [phone]

## 2016-01-19 ENCOUNTER — Telehealth: Payer: Self-pay | Admitting: Family Medicine

## 2016-01-19 ENCOUNTER — Other Ambulatory Visit: Payer: Self-pay | Admitting: *Deleted

## 2016-01-19 ENCOUNTER — Ambulatory Visit (HOSPITAL_COMMUNITY)
Admission: RE | Admit: 2016-01-19 | Discharge: 2016-01-19 | Disposition: A | Discharge status: Home / Self Care | Payer: Medicare Other | Source: Ambulatory Visit | Attending: Nurse Practitioner | Admitting: Nurse Practitioner

## 2016-01-19 DIAGNOSIS — I4581 Long QT syndrome: Secondary | ICD-10-CM | POA: Diagnosis not present

## 2016-01-19 DIAGNOSIS — I480 Paroxysmal atrial fibrillation: Secondary | ICD-10-CM

## 2016-01-19 DIAGNOSIS — Z131 Encounter for screening for diabetes mellitus: Secondary | ICD-10-CM

## 2016-01-19 DIAGNOSIS — I4891 Unspecified atrial fibrillation: Secondary | ICD-10-CM | POA: Insufficient documentation

## 2016-01-19 DIAGNOSIS — Z79899 Other long term (current) drug therapy: Secondary | ICD-10-CM

## 2016-01-19 DIAGNOSIS — Z125 Encounter for screening for malignant neoplasm of prostate: Secondary | ICD-10-CM

## 2016-01-19 DIAGNOSIS — E785 Hyperlipidemia, unspecified: Secondary | ICD-10-CM

## 2016-01-19 LAB — BASIC METABOLIC PANEL
ANION GAP: 8 (ref 5–15)
BUN: 14 mg/dL (ref 6–20)
CALCIUM: 9.4 mg/dL (ref 8.9–10.3)
CO2: 28 mmol/L (ref 22–32)
Chloride: 104 mmol/L (ref 101–111)
Creatinine, Ser: 1.11 mg/dL (ref 0.61–1.24)
GFR calc Af Amer: >60 mL/min (ref >60)
Glucose, Bld: 99 mg/dL (ref 65–99)
POTASSIUM: 4.3 mmol/L (ref 3.5–5.1)
Sodium: 140 mmol/L (ref 135–145)

## 2016-01-19 NOTE — Telephone Encounter (Signed)
Patient has wellness 6/9 and requesting lab paper.

## 2016-01-19 NOTE — Telephone Encounter (Signed)
Lip psa liv glu

## 2016-01-19 NOTE — Telephone Encounter (Signed)
Left message on voicemail notifying patient that blood work has been ordered.  

## 2016-01-19 NOTE — Patient Outreach (Signed)
[facility]) Care Management  01/19/2016  [name] 26-Dec-1945 [id number]  I reached [name] by phone today when I reached out to [name] for transition of care assessment and follow up on medication needs. [name] was not at home but [name] was able to confirm that [name] is paying $313 out of pocket for generic Tikosyn at [facility] after he used the "Singular" coupon given to him at the [facility]. [name] indicated that he was also given a "[employer]" application for patient assistance but has thus far not completed it stating he felt uncomfortable providing the requested documentation (tax returns).   [name] stated she had a poor memory and asked if I would call back and leave a message on the answering machine which I did. She also asked if I would be able to call late this evening as she anticipated [name] being out most of the day.   Plan: I will attempt to reach [name] later this evening. I have referred [name] to our pharmacy team for further intervention related to medication needs and lack of Part D plan.    [place] Management  [phone]

## 2016-01-19 NOTE — Progress Notes (Addendum)
Pt in for repeat EKG. [name] NP to review.  Pt is in SR today, qtc stable, bmet pending. Has f/u in 2 weeks.Feels well.

## 2016-01-20 ENCOUNTER — Telehealth (HOSPITAL_COMMUNITY): Payer: Self-pay | Admitting: *Deleted

## 2016-01-20 ENCOUNTER — Other Ambulatory Visit: Payer: Self-pay

## 2016-01-20 NOTE — Telephone Encounter (Signed)
Pt wife called in to confirm he could have lidocaine and spetracaine injections for dental work done with [employer].  Discussed with pharmacist and since localized would be permissible.

## 2016-01-20 NOTE — Patient Outreach (Signed)
I called Mr. Cordoves to see how I could assist with his medications needs.  I had to leave a HIPPA compliant message for the patient to call me back.  If I do not hear back from the patient today, I will call the patient back at a later date.   Deanne Coffer, PharmD, [place] [phone]

## 2016-01-21 ENCOUNTER — Other Ambulatory Visit: Payer: Self-pay | Admitting: *Deleted

## 2016-01-21 NOTE — Patient Outreach (Addendum)
[name]) Care Management  01/21/2016  [name] Sep 15, 1945 [id number]  Received a return call from [name] who agreed to meet me at [facility] on Wednesday morning to review medications and discuss medication assistance needs and his new Afib diagnosis.   Plan: I will see [name] at [facility] next week.    [place] Management  [phone]

## 2016-01-25 ENCOUNTER — Other Ambulatory Visit: Payer: Self-pay | Admitting: Pharmacist

## 2016-01-25 DIAGNOSIS — Z79899 Other long term (current) drug therapy: Secondary | ICD-10-CM | POA: Diagnosis not present

## 2016-01-25 DIAGNOSIS — E785 Hyperlipidemia, unspecified: Secondary | ICD-10-CM | POA: Diagnosis not present

## 2016-01-25 DIAGNOSIS — Z131 Encounter for screening for diabetes mellitus: Secondary | ICD-10-CM | POA: Diagnosis not present

## 2016-01-25 DIAGNOSIS — Z125 Encounter for screening for malignant neoplasm of prostate: Secondary | ICD-10-CM | POA: Diagnosis not present

## 2016-01-25 NOTE — Patient Outreach (Signed)
[facility]) Care Management  01/25/2016  [name] 06-17-1946 [id number]  [place] received a referral from [place], [facility] Liaison regarding patient assistance options for dofetilide as patient reported he does not have Medicare Part D benefits.   [place] attempted, unsuccessfully to reach patient on 01/20/16, and a second unsuccessful attempt to reach patient was made today with a HIPAA compliant message left requesting a return call.    Per chart review, there is a note from [place], [place] on 01/19/16 that patient has the [place] patient assistance application, this may be for Xarelto as Tikosyn patient assistance would be through [employer].    Plan:  Will allow patient time to call [place] back and if no return call will make another attempt within the next week.   Karrie Meres, PharmD, [place] [phone]

## 2016-01-26 ENCOUNTER — Other Ambulatory Visit: Payer: Self-pay | Admitting: *Deleted

## 2016-01-26 LAB — HEPATIC FUNCTION PANEL
ALK PHOS: 90 IU/L (ref 39–117)
ALT: 21 IU/L (ref 0–44)
AST: 28 IU/L (ref 0–40)
Albumin: 4.2 g/dL (ref 3.5–4.8)
BILIRUBIN TOTAL: 0.6 mg/dL (ref 0.0–1.2)
BILIRUBIN, DIRECT: 0.21 mg/dL (ref 0.00–0.40)
Total Protein: 7.1 g/dL (ref 6.0–8.5)

## 2016-01-26 LAB — LIPID PANEL
CHOLESTEROL TOTAL: 134 mg/dL (ref 100–199)
Chol/HDL Ratio: 2.2 ratio units (ref 0.0–5.0)
HDL: 62 mg/dL (ref >39)
LDL Calculated: 61 mg/dL (ref 0–99)
TRIGLYCERIDES: 55 mg/dL (ref 0–149)
VLDL Cholesterol Cal: 11 mg/dL (ref 5–40)

## 2016-01-26 LAB — PSA: PROSTATE SPECIFIC AG, SERUM: 0.9 ng/mL (ref 0.0–4.0)

## 2016-01-26 LAB — GLUCOSE, RANDOM: Glucose: 92 mg/dL (ref 65–99)

## 2016-01-26 NOTE — Patient Outreach (Signed)
[facility]) Care Management  01/26/2016  BILLION MCVICKER June 05, 1946 [id number]   HIPPA compliant voice message left requesting return call from Mr. Clement Husbands.   Plan: In my absence, I will ask my coverage partner to follow up with Mr. Nardozzi and [place] Management pharmacist Lennette Bihari Reudinger next week.    [place] Management  [phone]

## 2016-01-28 ENCOUNTER — Other Ambulatory Visit: Payer: Self-pay | Admitting: Pharmacist

## 2016-01-28 NOTE — Patient Outreach (Signed)
[facility]) Care Management  01/28/2016  [name] 1946/06/27 [id number]  [facility] Pharmacist made unsuccessful attempt to reach patient.  HIPAA compliant message requesting a call back was left.   Will make a third attempt to reach patient within the next week if no return call from patient.  [place] referral was for patient assistance with medication cost.   Karrie Meres, PharmD, [place] [phone]

## 2016-01-31 ENCOUNTER — Ambulatory Visit (HOSPITAL_COMMUNITY): Payer: Medicare Other | Admitting: Nurse Practitioner

## 2016-02-01 ENCOUNTER — Other Ambulatory Visit: Payer: Self-pay | Admitting: *Deleted

## 2016-02-01 NOTE — Patient Outreach (Signed)
Covering RNCM-Transition of care outreach call attempted. No answer, VM  left requesting call back. Plan to follow up with assigned [place]. Laymond Purser, RN, BSN, [place] [phone]

## 2016-02-03 ENCOUNTER — Encounter: Payer: Self-pay | Admitting: Family Medicine

## 2016-02-04 ENCOUNTER — Ambulatory Visit (INDEPENDENT_AMBULATORY_CARE_PROVIDER_SITE_OTHER): Payer: Medicare Other | Admitting: Family Medicine

## 2016-02-04 ENCOUNTER — Encounter: Payer: Self-pay | Admitting: Pharmacist

## 2016-02-04 ENCOUNTER — Encounter: Payer: Self-pay | Admitting: *Deleted

## 2016-02-04 ENCOUNTER — Other Ambulatory Visit: Payer: Self-pay | Admitting: Pharmacist

## 2016-02-04 ENCOUNTER — Encounter: Payer: Self-pay | Admitting: Family Medicine

## 2016-02-04 ENCOUNTER — Other Ambulatory Visit: Payer: Self-pay | Admitting: *Deleted

## 2016-02-04 VITALS — BP 132/74 | Ht 65.75 in | Wt 150.0 lb

## 2016-02-04 DIAGNOSIS — Z0000 Encounter for general adult medical examination without abnormal findings: Secondary | ICD-10-CM | POA: Diagnosis not present

## 2016-02-04 NOTE — Patient Outreach (Signed)
Voicemail received from patient wife.  She states that patient no longer wants to participate because he is working and does not feel he can devote time To the [facility] program. She requests case closure.  Called back to patient number, no answer, left RNCM contact.  Plan to let assigned RNCM know of patient wishes and close case per protocol. RNCM community will sign off. Royetta Crochet. Laymond Purser, RN, BSN, [place] [phone]

## 2016-02-04 NOTE — Patient Outreach (Signed)
[facility]) Care Management  02/04/2016  [name] [birth year] [id number]  Third attempt by [facility] Pharmacist to reach patient to follow-up on referral for patient assistance.  No answer, HIPAA compliant message left requesting call back.    Plan:  Will mail outreach letter to patient, if no response received in 10 business days, will close pharmacy case.    Karrie Meres, PharmD, [place] [phone]

## 2016-02-04 NOTE — Progress Notes (Signed)
Subjective:    Patient ID: [name], male    DOB: [birth year], 70 y.o.   MRN: [id number]  HPI AWV- Annual Wellness Visit  The patient was seen for their annual wellness visit. The patient's past medical history, surgical history, and family history were reviewed. Pertinent vaccines were reviewed ( tetanus, pneumonia, shingles, flu) The patient's medication list was reviewed and updated.  The height and weight were entered. The patient's current BMI is: 24.39  Cognitive screening was completed. Outcome of Mini - Cog: pass  Falls within the past 6 months: none  Current tobacco usage: none (All patients who use tobacco were given written and verbal information on quitting)  Recent listing of emergency department/hospitalizations over the past year were reviewed.  current specialist the patient sees on a regular basis: cardiologist   Medicare annual wellness visit patient questionnaire was reviewed.  A written screening schedule for the patient for the next 5-10 years was given. Appropriate discussion of followup regarding next visit was discussed.  Working hard on diet and exercising reg   xarelto now due to a fib issues  Results for orders placed or performed in visit on 01/19/16  Lipid panel  Result Value Ref Range   Cholesterol, Total 134 100 - 199 mg/dL   Triglycerides 55 0 - 149 mg/dL   HDL 62 >39 mg/dL   VLDL Cholesterol Cal 11 5 - 40 mg/dL   LDL Calculated 61 0 - 99 mg/dL   Chol/HDL Ratio 2.2 0.0 - 5.0 ratio units  Hepatic function panel  Result Value Ref Range   Total Protein 7.1 6.0 - 8.5 g/dL   Albumin 4.2 3.5 - 4.8 g/dL   Bilirubin Total 0.6 0.0 - 1.2 mg/dL   Bilirubin, Direct 0.21 0.00 - 0.40 mg/dL   Alkaline Phosphatase 90 39 - 117 IU/L   AST 28 0 - 40 IU/L   ALT 21 0 - 44 IU/L  PSA  Result Value Ref Range   Prostate Specific Ag, Serum 0.9 0.0 - 4.0 ng/mL  Glucose, random  Result Value Ref Range   Glucose 92 65 - 99 mg/dL    Colon due next  yr  compiant with meds    Review of Systems  Constitutional: Negative for fever, activity change and appetite change.  HENT: Negative for congestion and rhinorrhea.   Eyes: Negative for discharge.  Respiratory: Negative for cough and wheezing.   Cardiovascular: Negative for chest pain.  Gastrointestinal: Negative for vomiting, abdominal pain and blood in stool.  Genitourinary: Negative for frequency and difficulty urinating.  Musculoskeletal: Negative for neck pain.  Skin: Negative for rash.  Allergic/Immunologic: Negative for environmental allergies and food allergies.  Neurological: Negative for weakness and headaches.  Psychiatric/Behavioral: Negative for agitation.  All other systems reviewed and are negative.      Objective:   Physical Exam  Constitutional: He appears well-developed and well-nourished.  HENT:  Head: Normocephalic and atraumatic.  Right Ear: External ear normal.  Left Ear: External ear normal.  Nose: Nose normal.  Mouth/Throat: Oropharynx is clear and moist.  Eyes: EOM are normal. Pupils are equal, round, and reactive to light.  Neck: Normal range of motion. Neck supple. No thyromegaly present.  Cardiovascular: Normal rate, regular rhythm and normal heart sounds.   No murmur heard. Pulmonary/Chest: Effort normal and breath sounds normal. No respiratory distress. He has no wheezes.  Abdominal: Soft. Bowel sounds are normal. He exhibits no distension and no mass. There is no tenderness.  Genitourinary: Penis  normal.  Musculoskeletal: Normal range of motion. He exhibits no edema.  Lymphadenopathy:    He has no cervical adenopathy.  Neurological: He is alert. He exhibits normal muscle tone.  Skin: Skin is warm and dry. No erythema.  Psychiatric: He has a normal mood and affect. His behavior is normal. Judgment normal.  Vitals reviewed.   Prostate gland within normal limits ankles no edema, positive CABG chest scar      Assessment & Plan:   Impression 1 wellness exam. Up-to-date on colonoscopy and vaccinations #2 coronary artery disease #3 COPD #4 hypertension #5 hyperlipidemia plan appropriate blood work discussed. Diet exercise discussed. Cardiologist said now taken over prescriptions for hypertension and hyperlipidemia so evaluated in one year W[facility]

## 2016-02-07 ENCOUNTER — Other Ambulatory Visit: Payer: Self-pay | Admitting: Pharmacist

## 2016-02-07 NOTE — Patient Outreach (Addendum)
[facility]) Care Management  02/07/2016  [name] 25-Aug-1946 [id number]  Patient was referred to [place] for medication assistance.  Pharmacy has made 4 unsuccessful attempts to reach patient with no return calls from patient.    Per chart review, note on 02/04/16, patient's spouse returned call to Merrily Brittle, [place] Coordinator that patient is not interested in [facility] services.  [facility] RN Community has sent patient and PCP case closure letters.    Pharmacist was going to mail an unsuccessful outreach letter to patient, this letter was not mailed as patient declined [facility] services.  Given return call to [place] of patient declining [facility] services, will close out pharmacy case.    Plan:  [place] case closed as patient is not interested in [facility] services per 02/04/16 note in chart from [place], [place].   Karrie Meres, PharmD, [place] [phone]

## 2016-02-09 ENCOUNTER — Encounter (HOSPITAL_COMMUNITY): Payer: Self-pay | Admitting: Nurse Practitioner

## 2016-02-09 ENCOUNTER — Ambulatory Visit (HOSPITAL_COMMUNITY)
Admission: RE | Admit: 2016-02-09 | Discharge: 2016-02-09 | Disposition: A | Discharge status: Home / Self Care | Payer: Medicare Other | Source: Ambulatory Visit | Attending: Nurse Practitioner | Admitting: Nurse Practitioner

## 2016-02-09 DIAGNOSIS — R9431 Abnormal electrocardiogram [ECG] [EKG]: Secondary | ICD-10-CM | POA: Insufficient documentation

## 2016-02-09 DIAGNOSIS — I480 Paroxysmal atrial fibrillation: Secondary | ICD-10-CM

## 2016-02-09 DIAGNOSIS — I4891 Unspecified atrial fibrillation: Secondary | ICD-10-CM | POA: Diagnosis present

## 2016-02-09 DIAGNOSIS — I491 Atrial premature depolarization: Secondary | ICD-10-CM | POA: Insufficient documentation

## 2016-02-10 ENCOUNTER — Encounter (HOSPITAL_COMMUNITY): Payer: Self-pay | Admitting: Nurse Practitioner

## 2016-02-10 NOTE — Progress Notes (Signed)
Patient ID: [name], male   DOB: 04-24-1946, 70 y.o.   MRN: [id number]     Primary Care Physician: Mickie Hillier, MD Referring Physician:Dr. Wolfe Rentz is a 70 y.o. male with a h/o PAF that underwent tikosyn loading at [name] 5/15 for f/u. He reports that he is doing well and has not had any further issues with afib. He is being compliant with tikosyn as well as xarelto. BP up slightly today but at home well managed. 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  Today, he denies symptoms of palpitations, chest pain, shortness of breath, orthopnea, PND, lower extremity edema, dizziness, presyncope, syncope, or neurologic sequela. The patient is tolerating medications without difficulties and is otherwise without complaint today.   Past Medical History  Diagnosis Date  . Hypertension   . CAD (coronary artery disease)   . Headache(784.0)   . COPD (chronic obstructive pulmonary disease) ([place])   . Gastritis   . Bilateral carotid artery disease ([place]) 07/21/2013  . Atrial fibrillation ([place])   . Visit for monitoring Tikosyn therapy 12/2015   Past Surgical History  Procedure Laterality Date  . Coronary artery bypass graft    . Incision and drainage deep neck abscess    . Tee without cardioversion N/A 08/02/2015    Procedure: TRANSESOPHAGEAL ECHOCARDIOGRAM (TEE);  Surgeon: Sueanne Margarita, MD;  Location: [place];  Service: Cardiovascular;  Laterality: N/A;  . Cardioversion N/A 09/08/2015    Procedure: CARDIOVERSION;  Surgeon: Thayer Headings, MD;  Location: [facility] ENDOSCOPY;  Service: Cardiovascular;  Laterality: N/A;  . Tee without cardioversion N/A 09/08/2015    Procedure: TRANSESOPHAGEAL ECHOCARDIOGRAM (TEE);  Surgeon: Thayer Headings, MD;  Location: [place];  Service: Cardiovascular;  Laterality: N/A;  . Rotator cuff repair Bilateral     Current Outpatient Prescriptions  Medication Sig Dispense Refill  . ALPRAZolam (XANAX) 0.25 MG tablet Take 0.25 mg  by mouth at bedtime.     [name] atorvastatin (LIPITOR) 40 MG tablet Take 40 mg by mouth at bedtime.     . dofetilide (TIKOSYN) 500 MCG capsule Take 1 capsule (500 mcg total) by mouth 2 (two) times daily. 180 capsule 3  . lisinopril (PRINIVIL,ZESTRIL) 20 MG tablet Take 1 tablet (20 mg total) by mouth daily. 180 tablet 1  . Multiple Vitamins-Minerals (MULTIVITAMIN WITH MINERALS) tablet Take 1 tablet by mouth at bedtime.     . nitroGLYCERIN (NITROSTAT) 0.4 MG SL tablet Place 0.4 mg under the tongue every 5 (five) minutes as needed for chest pain (x 3 doses).    . Omega-3 Fatty Acids (FISH OIL) 1200 MG CAPS Take 1,200 mg by mouth at bedtime.     . rivaroxaban (XARELTO) 20 MG TABS tablet Take 1 tablet (20 mg total) by mouth daily with supper. (Patient taking differently: Take 20 mg by mouth daily after supper. ) 30 tablet 11  . vitamin C (ASCORBIC ACID) 500 MG tablet Take 500 mg by mouth at bedtime.      No current facility-administered medications for this encounter.    Allergies  Allergen Reactions  . Doxycycline Shortness Of Breath    Also has to urinate more often  . Nsaids Hypertension    Gastritis     Social History   Social History  . Marital Status: Married    Spouse Name: N/A  . Number of Children: N/A  . Years of Education: N/A   Occupational History  . Not on file.  Social History Main Topics  . Smoking status: Former Smoker    Quit date: 08/28/2006  . Smokeless tobacco: Never Used  . Alcohol Use: No     Comment: quit drinking in the 70's  . Drug Use: No  . Sexual Activity: Not on file   Other Topics Concern  . Not on file   Social History Narrative    Family History  Problem Relation Age of Onset  . Diabetes Mother   . Heart attack Father   . Hypertension Mother   . Hypertension Sister   . Diabetes Sister   . Sudden death Father     ROS- All systems are reviewed and negative except as per the HPI above  Physical Exam: Filed Vitals:   02/09/16 1027  BP:  150/86  Pulse: 71  Height: 5\' 5"  (1.651 m)  Weight: 151 lb 12.8 oz (68.856 kg)    GEN- The patient is well appearing, alert and oriented x 3 today.   Head- normocephalic, atraumatic Eyes-  Sclera clear, conjunctiva pink Ears- hearing intact Oropharynx- clear Neck- supple, no JVP Lymph- no cervical lymphadenopathy Lungs- Clear to ausculation bilaterally, normal work of breathing Heart- Regular rate and rhythm, no murmurs, rubs or gallops, PMI not laterally displaced GI- soft, NT, ND, + BS Extremities- no clubbing, cyanosis, or edema MS- no significant deformity or atrophy Skin- no rash or lesion Psych- euthymic mood, full affect Neuro- strength and sensation are intact  EKG-SR ar 71 bpm, rare PAC, pr int 150 ms, qrs int 73 bpm, qtc 456 ms(stable) Epic records reviewed  Assessment and Plan: 1. PAF Maintaining SR on tikosyn 500 mg bid Mag 2.3 (5/22), K+ 5.2 ( 5/24) Continue xarelto 20 mg a day  F/u in 3 months  Butch Penny C. Angelize Ryce, [facility] [address] [place], [place] [address] [phone]

## 2016-02-21 ENCOUNTER — Ambulatory Visit: Payer: Medicare Other | Admitting: Nurse Practitioner

## 2016-02-21 ENCOUNTER — Ambulatory Visit (INDEPENDENT_AMBULATORY_CARE_PROVIDER_SITE_OTHER): Payer: Medicare Other | Admitting: Family Medicine

## 2016-02-21 ENCOUNTER — Encounter: Payer: Self-pay | Admitting: Family Medicine

## 2016-02-21 VITALS — BP 132/80 | Temp 98.2°F | Ht 65.75 in | Wt 154.2 lb

## 2016-02-21 DIAGNOSIS — B9689 Other specified bacterial agents as the cause of diseases classified elsewhere: Secondary | ICD-10-CM

## 2016-02-21 DIAGNOSIS — J019 Acute sinusitis, unspecified: Secondary | ICD-10-CM | POA: Diagnosis not present

## 2016-02-21 MED ORDER — AMOXICILLIN-POT CLAVULANATE 875-125 MG PO TABS: 1.0000 | ORAL_TABLET | Freq: Two times a day (BID) | ORAL | Status: DC

## 2016-02-21 NOTE — Patient Instructions (Signed)
If not improving over the next few days or if fevers get worse as the week goes on let us recheck you  Currently no Xrays or labs needed  Call us if any problems

## 2016-02-21 NOTE — Progress Notes (Signed)
   Subjective:    Patient ID: [name], male    DOB: Jan 03, 1946, 70 y.o.   MRN: [id number]  Cough This is a new problem. The current episode started in the past 7 days. The problem has been unchanged. The cough is non-productive. Associated symptoms include chills, a fever and rhinorrhea. Pertinent negatives include no chest pain, ear pain or wheezing. Nothing aggravates the symptoms. He has tried nothing for the symptoms. The treatment provided no relief.   Patient states that he has no other concerns at this time.    patient with head congestion drainage coughing some chest congestion denies nausea vomiting diarrhea currently Review of Systems  Constitutional: Positive for fever and chills. Negative for activity change.  HENT: Positive for congestion and rhinorrhea. Negative for ear pain.   Eyes: Negative for discharge.  Respiratory: Positive for cough. Negative for wheezing.   Cardiovascular: Negative for chest pain.       Objective:   Physical Exam  Constitutional: He appears well-developed.  HENT:  Head: Normocephalic.  Mouth/Throat: Oropharynx is clear and moist. No oropharyngeal exudate.  Neck: Normal range of motion.  Cardiovascular: Normal rate, regular rhythm and normal heart sounds.   No murmur heard. Pulmonary/Chest: Effort normal and breath sounds normal. He has no wheezes.  Lymphadenopathy:    He has no cervical adenopathy.  Neurological: He exhibits normal muscle tone.  Skin: Skin is warm and dry.  Nursing note and vitals reviewed.         Assessment & Plan:   viral URI Secondary rhinosinusitis Bronchitis Antibiotics prescribed warning signs discussed  follow-up if progressive troubles

## 2016-02-28 ENCOUNTER — Other Ambulatory Visit: Payer: Self-pay | Admitting: Family Medicine

## 2016-02-28 NOTE — Telephone Encounter (Signed)
May have this +3 refills 

## 2016-05-03 ENCOUNTER — Other Ambulatory Visit: Payer: Self-pay | Admitting: Family Medicine

## 2016-05-11 ENCOUNTER — Encounter (HOSPITAL_COMMUNITY): Payer: Self-pay | Admitting: Nurse Practitioner

## 2016-05-11 ENCOUNTER — Other Ambulatory Visit (HOSPITAL_COMMUNITY): Payer: Self-pay | Admitting: *Deleted

## 2016-05-11 ENCOUNTER — Ambulatory Visit (HOSPITAL_COMMUNITY)
Admission: RE | Admit: 2016-05-11 | Discharge: 2016-05-11 | Disposition: A | Discharge status: Home / Self Care | Payer: Medicare Other | Source: Ambulatory Visit | Attending: Nurse Practitioner | Admitting: Nurse Practitioner

## 2016-05-11 VITALS — BP 180/90 | HR 65 | Ht 65.75 in | Wt 149.0 lb

## 2016-05-11 DIAGNOSIS — J449 Chronic obstructive pulmonary disease, unspecified: Secondary | ICD-10-CM | POA: Diagnosis not present

## 2016-05-11 DIAGNOSIS — I10 Essential (primary) hypertension: Secondary | ICD-10-CM | POA: Insufficient documentation

## 2016-05-11 DIAGNOSIS — I4819 Other persistent atrial fibrillation: Secondary | ICD-10-CM

## 2016-05-11 DIAGNOSIS — I481 Persistent atrial fibrillation: Secondary | ICD-10-CM | POA: Diagnosis not present

## 2016-05-11 DIAGNOSIS — I2510 Atherosclerotic heart disease of native coronary artery without angina pectoris: Secondary | ICD-10-CM | POA: Diagnosis not present

## 2016-05-11 DIAGNOSIS — Z87891 Personal history of nicotine dependence: Secondary | ICD-10-CM | POA: Diagnosis not present

## 2016-05-11 DIAGNOSIS — K2970 Gastritis, unspecified, without bleeding: Secondary | ICD-10-CM | POA: Insufficient documentation

## 2016-05-11 DIAGNOSIS — I480 Paroxysmal atrial fibrillation: Secondary | ICD-10-CM | POA: Diagnosis present

## 2016-05-11 LAB — BASIC METABOLIC PANEL
ANION GAP: 6 (ref 5–15)
BUN: 13 mg/dL (ref 6–20)
CALCIUM: 9.4 mg/dL (ref 8.9–10.3)
CHLORIDE: 107 mmol/L (ref 101–111)
CO2: 29 mmol/L (ref 22–32)
Creatinine, Ser: 1.1 mg/dL (ref 0.61–1.24)
GFR calc non Af Amer: >60 mL/min (ref >60)
Glucose, Bld: 105 mg/dL — ABNORMAL HIGH (ref 65–99)
Potassium: 3.7 mmol/L (ref 3.5–5.1)
SODIUM: 142 mmol/L (ref 135–145)

## 2016-05-11 LAB — MAGNESIUM: MAGNESIUM: 2.1 mg/dL (ref 1.7–2.4)

## 2016-05-11 MED ORDER — POTASSIUM CHLORIDE ER 10 MEQ PO TBCR: 10.0000 meq | EXTENDED_RELEASE_TABLET | Freq: Every day | ORAL | 6 refills | Status: DC

## 2016-05-11 NOTE — Progress Notes (Signed)
Patient ID: [name], male   DOB: July 26, 1946, 70 y.o.   MRN: [id number]     Primary Care Physician: Mickie Hillier, MD Referring Physician:Dr. Karn Ledonne is a 70 y.o. male with a h/o PAF that underwent tikosyn loading at [facility] 5/15 for f/u. He reports that he is doing well and has not had any further issues with afib. He is being compliant with tikosyn as well as xarelto. 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  Today, he denies symptoms of palpitations, chest pain, shortness of breath, orthopnea, PND, lower extremity edema, dizziness, presyncope, syncope, or neurologic sequela. The patient is tolerating medications without difficulties and is otherwise without complaint today.   Past Medical History:  Diagnosis Date  . Atrial fibrillation ([place])   . Bilateral carotid artery disease ([place]) 07/21/2013  . CAD (coronary artery disease)   . COPD (chronic obstructive pulmonary disease) ([place])   . Gastritis   . Headache(784.0)   . Hypertension   . Visit for monitoring Tikosyn therapy 12/2015   Past Surgical History:  Procedure Laterality Date  . CARDIOVERSION N/A 09/08/2015   Procedure: CARDIOVERSION;  Surgeon: Thayer Headings, MD;  Location: [place];  Service: Cardiovascular;  Laterality: N/A;  . CORONARY ARTERY BYPASS GRAFT    . INCISION AND DRAINAGE DEEP NECK ABSCESS    . ROTATOR CUFF REPAIR Bilateral   . TEE WITHOUT CARDIOVERSION N/A 08/02/2015   Procedure: TRANSESOPHAGEAL ECHOCARDIOGRAM (TEE);  Surgeon: Sueanne Margarita, MD;  Location: [facility] ENDOSCOPY;  Service: Cardiovascular;  Laterality: N/A;  . TEE WITHOUT CARDIOVERSION N/A 09/08/2015   Procedure: TRANSESOPHAGEAL ECHOCARDIOGRAM (TEE);  Surgeon: Thayer Headings, MD;  Location: [facility] ENDOSCOPY;  Service: Cardiovascular;  Laterality: N/A;    Current Outpatient  Prescriptions  Medication Sig Dispense Refill  . ALPRAZolam (XANAX) 0.25 MG tablet TAKE ONE-HALF TO ONE TABLET BY MOUTH TWICE DAILY AS NEEDED (MUST  LAST  ONE  MONTH) 30 tablet 3  . atorvastatin (LIPITOR) 40 MG tablet TAKE ONE TABLET BY MOUTH EVERY DAY. 90 tablet 1  . dofetilide (TIKOSYN) 500 MCG capsule Take 1 capsule (500 mcg total) by mouth 2 (two) times daily. 180 capsule 3  . lisinopril (PRINIVIL,ZESTRIL) 20 MG tablet Take 1 tablet (20 mg total) by mouth daily. 180 tablet 1  . Multiple Vitamins-Minerals (MULTIVITAMIN WITH MINERALS) tablet Take 1 tablet by mouth at bedtime.     . nitroGLYCERIN (NITROSTAT) 0.4 MG SL tablet Place 0.4 mg under the tongue every 5 (five) minutes as needed for chest pain (x 3 doses).    . Omega-3 Fatty Acids (FISH OIL) 1200 MG CAPS Take 1,200 mg by mouth at bedtime.     . rivaroxaban (XARELTO) 20 MG TABS tablet Take 1 tablet (20 mg total) by mouth daily with supper. (Patient taking differently: Take 20 mg by mouth daily after supper. ) 30 tablet 11  . vitamin C (ASCORBIC ACID) 500 MG tablet Take 500 mg by mouth at bedtime.      No current facility-administered medications for this encounter.     Allergies  Allergen Reactions  . Doxycycline Shortness Of Breath    Also has to urinate more often  . Nsaids Hypertension    Gastritis     Social History   Social History  . Marital status: Married  Spouse name: N/A  . Number of children: N/A  . Years of education: N/A   Occupational History  . Not on file.   Social History Main Topics  . Smoking status: Former Smoker    Quit date: 08/28/2006  . Smokeless tobacco: Never Used  . Alcohol use No     Comment: quit drinking in the 70's  . Drug use: No  . Sexual activity: Not on file   Other Topics Concern  . Not on file   Social History Narrative  . No narrative on file    Family History  Problem Relation Age of Onset  . Diabetes Mother   . Heart attack Father   . Hypertension Mother   .  Hypertension Sister   . Diabetes Sister   . Sudden death Father     ROS- All systems are reviewed and negative except as per the HPI above  Physical Exam: Vitals:   05/11/16 0944  BP: (!) 180/90  Pulse: 65  Weight: 149 lb (67.6 kg)  Height: 5' 5.75" (1.67 m)    GEN- The patient is well appearing, alert and oriented x 3 today.   Head- normocephalic, atraumatic Eyes-  Sclera clear, conjunctiva pink Ears- hearing intact Oropharynx- clear Neck- supple, no JVP Lymph- no cervical lymphadenopathy Lungs- Clear to ausculation bilaterally, normal work of breathing Heart- Regular rate and rhythm, no murmurs, rubs or gallops, PMI not laterally displaced GI- soft, NT, ND, + BS Extremities- no clubbing, cyanosis, or edema MS- no significant deformity or atrophy Skin- no rash or lesion Psych- euthymic mood, full affect Neuro- strength and sensation are intact  EKG-SR with PAC's at 65 bpm, rare PAC, pr int 164 ms, qrs int 76 bpm, qtc 430 ms(stable) Epic records reviewed  Assessment and Plan: 1. PAF Maintaining SR on tikosyn 500 mg bid Bmet/mag today Continue xarelto 20 mg a day  2. H/o Carotid artery disease  Is due for carotid screening and f/u with Dr. Marlou Porch  Will request that this be scheduled  F/u in 3 months afib clinic for Tikosyn monitoring  Butch Penny C. Allan Minotti, [facility] [address] [place], [place] [address] [phone]

## 2016-05-12 ENCOUNTER — Telehealth: Payer: Self-pay | Admitting: Cardiology

## 2016-05-12 DIAGNOSIS — I739 Peripheral vascular disease, unspecified: Principal | ICD-10-CM

## 2016-05-12 DIAGNOSIS — I779 Disorder of arteries and arterioles, unspecified: Secondary | ICD-10-CM

## 2016-05-12 NOTE — Telephone Encounter (Signed)
New message    Pts wife is calling to have an order put in the system for a carotid. Please call.

## 2016-05-12 NOTE — Telephone Encounter (Signed)
Returned call to patient's wife.She stated husband is due to have carotid dopplers the end of 06/2016.Advised scheduler will call back to schedule carotid dopplers and follow up visit with him.

## 2016-05-25 ENCOUNTER — Ambulatory Visit (HOSPITAL_COMMUNITY)
Admission: RE | Admit: 2016-05-25 | Discharge: 2016-05-25 | Disposition: A | Discharge status: Home / Self Care | Payer: Medicare Other | Source: Ambulatory Visit | Attending: Nurse Practitioner | Admitting: Nurse Practitioner

## 2016-05-25 DIAGNOSIS — I481 Persistent atrial fibrillation: Secondary | ICD-10-CM | POA: Insufficient documentation

## 2016-05-25 DIAGNOSIS — I480 Paroxysmal atrial fibrillation: Secondary | ICD-10-CM

## 2016-05-25 LAB — BASIC METABOLIC PANEL
Anion gap: 6 (ref 5–15)
BUN: 12 mg/dL (ref 6–20)
CHLORIDE: 109 mmol/L (ref 101–111)
CO2: 26 mmol/L (ref 22–32)
CREATININE: 1.13 mg/dL (ref 0.61–1.24)
Calcium: 9.2 mg/dL (ref 8.9–10.3)
GFR calc Af Amer: >60 mL/min (ref >60)
GFR calc non Af Amer: >60 mL/min (ref >60)
Glucose, Bld: 102 mg/dL — ABNORMAL HIGH (ref 65–99)
Potassium: 4.2 mmol/L (ref 3.5–5.1)
SODIUM: 141 mmol/L (ref 135–145)

## 2016-06-29 ENCOUNTER — Other Ambulatory Visit: Payer: Self-pay | Admitting: Family Medicine

## 2016-06-29 NOTE — Telephone Encounter (Signed)
One mo plus 2 refok

## 2016-07-25 ENCOUNTER — Encounter: Payer: Self-pay | Admitting: Cardiology

## 2016-07-28 ENCOUNTER — Ambulatory Visit (HOSPITAL_COMMUNITY)
Admission: RE | Admit: 2016-07-28 | Discharge: 2016-07-28 | Disposition: A | Discharge status: Home / Self Care | Payer: Medicare Other | Source: Ambulatory Visit | Attending: Internal Medicine | Admitting: Internal Medicine

## 2016-07-28 DIAGNOSIS — I739 Peripheral vascular disease, unspecified: Secondary | ICD-10-CM

## 2016-07-28 DIAGNOSIS — I6523 Occlusion and stenosis of bilateral carotid arteries: Secondary | ICD-10-CM | POA: Diagnosis not present

## 2016-07-28 DIAGNOSIS — I779 Disorder of arteries and arterioles, unspecified: Secondary | ICD-10-CM | POA: Insufficient documentation

## 2016-08-01 DIAGNOSIS — Z23 Encounter for immunization: Secondary | ICD-10-CM | POA: Diagnosis not present

## 2016-08-02 ENCOUNTER — Ambulatory Visit (INDEPENDENT_AMBULATORY_CARE_PROVIDER_SITE_OTHER): Payer: Medicare Other | Admitting: Cardiology

## 2016-08-02 ENCOUNTER — Telehealth: Payer: Self-pay | Admitting: Cardiology

## 2016-08-02 VITALS — BP 148/84 | HR 68 | Ht 65.0 in | Wt 154.8 lb

## 2016-08-02 DIAGNOSIS — Z7901 Long term (current) use of anticoagulants: Secondary | ICD-10-CM

## 2016-08-02 DIAGNOSIS — I10 Essential (primary) hypertension: Secondary | ICD-10-CM

## 2016-08-02 DIAGNOSIS — I25708 Atherosclerosis of coronary artery bypass graft(s), unspecified, with other forms of angina pectoris: Secondary | ICD-10-CM | POA: Diagnosis not present

## 2016-08-02 DIAGNOSIS — E7800 Pure hypercholesterolemia, unspecified: Secondary | ICD-10-CM

## 2016-08-02 DIAGNOSIS — I739 Peripheral vascular disease, unspecified: Secondary | ICD-10-CM

## 2016-08-02 DIAGNOSIS — I480 Paroxysmal atrial fibrillation: Secondary | ICD-10-CM | POA: Diagnosis not present

## 2016-08-02 DIAGNOSIS — I779 Disorder of arteries and arterioles, unspecified: Secondary | ICD-10-CM

## 2016-08-02 NOTE — Telephone Encounter (Signed)
Pt's wife calling [phone] -returning Altha Harm call, pls call before 530pm

## 2016-08-02 NOTE — Patient Instructions (Signed)

## 2016-08-02 NOTE — Progress Notes (Signed)
[facility]. [address], [address] [place], [place]  [address] Phone: [phone] Fax:  [phone]  Date:  08/02/2016   ID:  Ian, Moyer [birth year], MRN [id number]  PCP:  Mickie Hillier, MD   History of Present Illness: [name] is a 70 y.o. male with paroxysmal atrial fibrillation that underwent Tikosyn loading on 01/10/16, followed by [name]. Has been taking both Xarelto and Tikosyn.  He works as an Clinical biochemist.   He has been feeling really good since being on Tikosyn and restoration of sinus rhythm has taken place. His ejection fraction has improved. Previously 30-35%, now 45-50%. No longer on diuretic.  Prior TEE cardioversion. Has CAD status post CABG in 2008.  COPD, tobacco use as well as carotid artery disease. Hyperlipidemia treated with pravastatin.   Original TEE cardioversion was canceled because a possible LA thrombus. Repeat looked okay. Successful cardioversion.  Chest x-ray showed COPD.  Stress test February of 2012 was low risk with no ischemia. Reassuring. This was prior to rotator cuff surgery.  Carotid artery duplex -Stable RICA [id number], LICA now [id number] mildly progressed.     Wt Readings from Last 3 Encounters:  08/02/16 154 lb 12.8 oz (70.2 kg)  05/11/16 149 lb (67.6 kg)  02/21/16 154 lb 4 oz (70 kg)     Past Medical History:  Diagnosis Date  . Atrial fibrillation ([name])   . Bilateral carotid artery disease ([name]) 07/21/2013  . CAD (coronary artery disease)   . COPD (chronic obstructive pulmonary disease) ([name])   . Gastritis   . Headache(784.0)   . Hypertension   . Visit for monitoring Tikosyn therapy 12/2015    Past Surgical History:  Procedure Laterality Date  . CARDIOVERSION N/A 09/08/2015   Procedure: CARDIOVERSION;  Surgeon: Thayer Headings, MD;  Location: [place];  Service: Cardiovascular;  Laterality: N/A;  . CORONARY ARTERY BYPASS GRAFT    . INCISION AND DRAINAGE DEEP NECK ABSCESS    . ROTATOR CUFF REPAIR Bilateral   .  TEE WITHOUT CARDIOVERSION N/A 08/02/2015   Procedure: TRANSESOPHAGEAL ECHOCARDIOGRAM (TEE);  Surgeon: Sueanne Margarita, MD;  Location: [facility] ENDOSCOPY;  Service: Cardiovascular;  Laterality: N/A;  . TEE WITHOUT CARDIOVERSION N/A 09/08/2015   Procedure: TRANSESOPHAGEAL ECHOCARDIOGRAM (TEE);  Surgeon: Thayer Headings, MD;  Location: [facility] ENDOSCOPY;  Service: Cardiovascular;  Laterality: N/A;    Current Outpatient Prescriptions  Medication Sig Dispense Refill  . ALPRAZolam (XANAX) 0.25 MG tablet TAKE ONE-HALF TO ONE TABLET BY MOUTH TWICE DAILY AS NEEDED ** MUST LAST ONE MONTH ** 30 tablet 2  . atorvastatin (LIPITOR) 40 MG tablet TAKE ONE TABLET BY MOUTH EVERY DAY. 90 tablet 1  . dofetilide (TIKOSYN) 500 MCG capsule Take 1 capsule (500 mcg total) by mouth 2 (two) times daily. 180 capsule 3  . lisinopril (PRINIVIL,ZESTRIL) 20 MG tablet Take 1 tablet (20 mg total) by mouth daily. 180 tablet 1  . Multiple Vitamins-Minerals (MULTIVITAMIN WITH MINERALS) tablet Take 1 tablet by mouth at bedtime.     . nitroGLYCERIN (NITROSTAT) 0.4 MG SL tablet Place 0.4 mg under the tongue every 5 (five) minutes as needed for chest pain (x 3 doses).    . Omega-3 Fatty Acids (FISH OIL) 1200 MG CAPS Take 1,200 mg by mouth at bedtime.     . potassium chloride (K-DUR) 10 MEQ tablet Take 1 tablet (10 mEq total) by mouth daily. 30 tablet 6  . rivaroxaban (XARELTO) 20 MG TABS tablet Take 1 tablet (20  mg total) by mouth daily with supper. (Patient taking differently: Take 20 mg by mouth daily after supper. ) 30 tablet 11  . vitamin C (ASCORBIC ACID) 500 MG tablet Take 500 mg by mouth at bedtime.      No current facility-administered medications for this visit.     Allergies:    Allergies  Allergen Reactions  . Doxycycline Shortness Of Breath    Also has to urinate more often  . Nsaids Hypertension    Gastritis     Social History:  The patient  reports that he quit smoking about 9 years ago. He has never used smokeless tobacco.  He reports that he does not drink alcohol or use drugs.   ROS:  Please see the history of present illness.   Thumb No syncope, no bleeding, no orthopnea, no PND   PHYSICAL EXAM: VS:  BP (!) 148/84   Pulse 68   Ht 5\' 5"  (1.651 m)   Wt 154 lb 12.8 oz (70.2 kg)   BMI 25.76 kg/m  Well nourished, well developed, in no acute distress  HEENT: normal  Neck: no JVD soft bilateral bruits Cardiac:  normal S1, S2; regular rate and rhythm; no murmur  Lungs:  clear to auscultation bilaterally, no wheezing, rhonchi or rales  Abd: soft, nontender, no hepatomegaly  Ext: no edema  Skin: warm and dry  Neuro: no focal abnormalities noted  EKG: Today - 09/10/15-sinus bradycardia with premature supraventricular complexes, PA-C, 57, T-wave inversion inferiorly as well as precordial leads lateral. Nonspecific. Personally viewed-prior 07/24/14-sinus rhythm, 62, nonspecific ST-T wave changes, ounce living ST segment consider inferior/anterolateral ischemia.prior NSR, 62, NSSTW changes.     TEE 09/08/15:  - Ejection fraction 35-40%  - No obvious thrombus  Echo 12/29/15: - Left ventricle: The cavity size was normal. Wall thickness was   normal. Systolic function was mildly reduced. The estimated   ejection fraction was in the range of 45% to 50%. Abnormal GLPSS   at -13% with inferoseptal strain abnormality. The study is not   technically sufficient to allow evaluation of LV diastolic   function. - Mitral valve: Mildly thickened leaflets . There was trivial   regurgitation. - Left atrium: Severely dilated at 51 ml/m2. - Right ventricle: The cavity size was mildly dilated. - Right atrium: The atrium was mildly dilated. - Tricuspid valve: There was trivial regurgitation. - Pulmonary arteries: PA peak pressure: 21 mm Hg (S). - Inferior vena cava: The vessel was normal in size. The   respirophasic diameter changes were in the normal range (>= 50%),   consistent with normal central venous  pressure.  Impressions:  - Compared to the most recent echo in 08/2015, the LVEF is slightly   improved at 45-50% wtih inferoseptal wall motion and strain   abnormalities.  Stress test February of 2012 was low risk with no ischemia. Reassuring. This was prior to rotator cuff surgery.  Carotid artery duplex 07/28/16: Heterogeneous plaque, bilaterally.  Stable 60-79% RICA stenosis.  Stable 123456 LICA stenosis.  Essentially stable >50% stenosis in the bilateral ECA's.  Normal subclavian arteries, bilaterally.  Patent vertebral arteries with antegrade flow.  F/U 1 year    ASSESSMENT AND PLAN:  Atrial flutter- 2:1 /prior atrial fibrillation paroxysmal - NOAC, Xarelto 20 mg once a day  - Successful TEE cardioversion on 09/08/15  - EF 35-40%-improved to 50% on echocardiogram in May 2017 after resolution of atrial fibrillation.  - Tikosyn 500 mg twice a day to maintain rhythm. Working well.  Appreciate Dr. Rayann Heman, [name] in atrial fibrillation clinic.  COPD  - Noted on chest x-ray  - Smoking history  CAD post bypass  - 2008  - Nuclear stress test 2012 reassuring  Prior cardiomyopathy  - EF is improved after restoration of sinus rhythm  - No need for Lasix. Feeling better.   Carotid artery disease  -stable. Continuing to monitor every 12 months. Asymptomatic, Statin. 79%.  Chronic anticoagulation- CHADS-VASc 3  Hypertension-currently reasonable controlled. Continue to monitor closely.   Hyperlipidemia-continue with atorvastatin. LDL <70.   Follow-up in 6 months with me.   Signed, Candee Furbish, MD [facility]  08/02/2016 11:17 AM

## 2016-08-03 NOTE — Telephone Encounter (Signed)
Pt's wife returning call from yesterday, said she will be in and out -ok to leave message on voicemail

## 2016-08-03 NOTE — Telephone Encounter (Signed)
PT 'S WIFE  AWARE CALLED TO GIVE  CAROTID  RESULTS WAS NOT AWRE PT  HAD APT  YESTERDAY  ./CY

## 2016-08-10 ENCOUNTER — Encounter (HOSPITAL_COMMUNITY): Payer: Self-pay | Admitting: Nurse Practitioner

## 2016-08-10 ENCOUNTER — Ambulatory Visit (HOSPITAL_COMMUNITY)
Admission: RE | Admit: 2016-08-10 | Discharge: 2016-08-10 | Disposition: A | Discharge status: Home / Self Care | Payer: Medicare Other | Source: Ambulatory Visit | Attending: Nurse Practitioner | Admitting: Nurse Practitioner

## 2016-08-10 VITALS — BP 154/88 | HR 59 | Ht 65.0 in | Wt 153.6 lb

## 2016-08-10 DIAGNOSIS — I10 Essential (primary) hypertension: Secondary | ICD-10-CM | POA: Insufficient documentation

## 2016-08-10 DIAGNOSIS — Z7901 Long term (current) use of anticoagulants: Secondary | ICD-10-CM | POA: Insufficient documentation

## 2016-08-10 DIAGNOSIS — I2510 Atherosclerotic heart disease of native coronary artery without angina pectoris: Secondary | ICD-10-CM | POA: Diagnosis not present

## 2016-08-10 DIAGNOSIS — J449 Chronic obstructive pulmonary disease, unspecified: Secondary | ICD-10-CM | POA: Diagnosis not present

## 2016-08-10 DIAGNOSIS — Z87891 Personal history of nicotine dependence: Secondary | ICD-10-CM | POA: Insufficient documentation

## 2016-08-10 DIAGNOSIS — I4891 Unspecified atrial fibrillation: Secondary | ICD-10-CM | POA: Diagnosis present

## 2016-08-10 DIAGNOSIS — I481 Persistent atrial fibrillation: Secondary | ICD-10-CM

## 2016-08-10 DIAGNOSIS — I480 Paroxysmal atrial fibrillation: Secondary | ICD-10-CM | POA: Insufficient documentation

## 2016-08-10 DIAGNOSIS — I4819 Other persistent atrial fibrillation: Secondary | ICD-10-CM

## 2016-08-10 LAB — BASIC METABOLIC PANEL
ANION GAP: 7 (ref 5–15)
BUN: 15 mg/dL (ref 6–20)
CALCIUM: 9.4 mg/dL (ref 8.9–10.3)
CO2: 28 mmol/L (ref 22–32)
Chloride: 107 mmol/L (ref 101–111)
Creatinine, Ser: 1.19 mg/dL (ref 0.61–1.24)
GFR, EST NON AFRICAN AMERICAN: 59 mL/min — AB (ref >60)
GLUCOSE: 100 mg/dL — AB (ref 65–99)
POTASSIUM: 4.1 mmol/L (ref 3.5–5.1)
SODIUM: 142 mmol/L (ref 135–145)

## 2016-08-10 LAB — MAGNESIUM: MAGNESIUM: 2.2 mg/dL (ref 1.7–2.4)

## 2016-08-10 NOTE — Progress Notes (Signed)
Patient ID: [name], male   DOB: [birth year], 70 y.o.   MRN: [id number]     Primary Care Physician: Mickie Hillier, MD Referring Physician:Dr. Melecio Wyly is a 70 y.o. male with a h/o PAF that underwent tikosyn loading at [facility] 5/15 for f/u. He reports that he is doing well and has not had any further issues with afib. He is being compliant with tikosyn as well as xarelto. 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  Return afib clinic 12/14, he not not had any afib. He feels well. QTc stable. He recently had a check up with Dr. Marlou Porch and felt to be stable.   Today, he denies symptoms of palpitations, chest pain, shortness of breath, orthopnea, PND, lower extremity edema, dizziness, presyncope, syncope, or neurologic sequela. The patient is tolerating medications without difficulties and is otherwise without complaint today.   Past Medical History:  Diagnosis Date  . Atrial fibrillation ([place])   . Bilateral carotid artery disease ([place]) 07/21/2013  . CAD (coronary artery disease)   . COPD (chronic obstructive pulmonary disease) ([place])   . Gastritis   . Headache(784.0)   . Hypertension   . Visit for monitoring Tikosyn therapy 12/2015   Past Surgical History:  Procedure Laterality Date  . CARDIOVERSION N/A 09/08/2015   Procedure: CARDIOVERSION;  Surgeon: Thayer Headings, MD;  Location: [place];  Service: Cardiovascular;  Laterality: N/A;  . CORONARY ARTERY BYPASS GRAFT    . INCISION AND DRAINAGE DEEP NECK ABSCESS    . ROTATOR CUFF REPAIR Bilateral   . TEE WITHOUT CARDIOVERSION N/A 08/02/2015   Procedure: TRANSESOPHAGEAL ECHOCARDIOGRAM (TEE);  Surgeon: Sueanne Margarita, MD;  Location: [facility] ENDOSCOPY;  Service: Cardiovascular;  Laterality: N/A;  . TEE WITHOUT CARDIOVERSION N/A 09/08/2015   Procedure: TRANSESOPHAGEAL  ECHOCARDIOGRAM (TEE);  Surgeon: Thayer Headings, MD;  Location: [facility] ENDOSCOPY;  Service: Cardiovascular;  Laterality: N/A;    Current Outpatient Prescriptions  Medication Sig Dispense Refill  . ALPRAZolam (XANAX) 0.25 MG tablet TAKE ONE-HALF TO ONE TABLET BY MOUTH TWICE DAILY AS NEEDED ** MUST LAST ONE MONTH ** 30 tablet 2  . atorvastatin (LIPITOR) 40 MG tablet TAKE ONE TABLET BY MOUTH EVERY DAY. 90 tablet 1  . dofetilide (TIKOSYN) 500 MCG capsule Take 1 capsule (500 mcg total) by mouth 2 (two) times daily. 180 capsule 3  . lisinopril (PRINIVIL,ZESTRIL) 20 MG tablet Take 1 tablet (20 mg total) by mouth daily. 180 tablet 1  . Multiple Vitamins-Minerals (MULTIVITAMIN WITH MINERALS) tablet Take 1 tablet by mouth at bedtime.     . nitroGLYCERIN (NITROSTAT) 0.4 MG SL tablet Place 0.4 mg under the tongue every 5 (five) minutes as needed for chest pain (x 3 doses).    . Omega-3 Fatty Acids (FISH OIL) 1200 MG CAPS Take 1,200 mg by mouth at bedtime.     . potassium chloride (K-DUR) 10 MEQ tablet Take 1 tablet (10 mEq total) by mouth daily. 30 tablet 6  . rivaroxaban (XARELTO) 20 MG TABS tablet Take 1 tablet (20 mg total) by mouth daily with supper. (Patient taking differently: Take 20 mg by mouth daily after supper. ) 30 tablet 11  . vitamin C (ASCORBIC ACID) 500 MG tablet Take 500 mg by mouth at bedtime.      No current facility-administered  medications for this encounter.     Allergies  Allergen Reactions  . Doxycycline Shortness Of Breath    Also has to urinate more often  . Nsaids Hypertension    Gastritis     Social History   Social History  . Marital status: Married    Spouse name: N/A  . Number of children: N/A  . Years of education: N/A   Occupational History  . Not on file.   Social History Main Topics  . Smoking status: Former Smoker    Quit date: 08/28/2006  . Smokeless tobacco: Never Used  . Alcohol use No     Comment: quit drinking in the 70's  . Drug use: No  . Sexual  activity: Not on file   Other Topics Concern  . Not on file   Social History Narrative  . No narrative on file    Family History  Problem Relation Age of Onset  . Diabetes Mother   . Hypertension Mother   . Heart attack Father   . Sudden death Father   . Hypertension Sister   . Diabetes Sister     ROS- All systems are reviewed and negative except as per the HPI above  Physical Exam: Vitals:   08/10/16 0929  BP: (!) 154/88  Pulse: (!) 59  Weight: 153 lb 9.6 oz (69.7 kg)  Height: 5\' 5"  (1.651 m)    GEN- The patient is well appearing, alert and oriented x 3 today.   Head- normocephalic, atraumatic Eyes-  Sclera clear, conjunctiva pink Ears- hearing intact Oropharynx- clear Neck- supple, no JVP Lymph- no cervical lymphadenopathy Lungs- Clear to ausculation bilaterally, normal work of breathing Heart- Regular rate and rhythm, no murmurs, rubs or gallops, PMI not laterally displaced GI- soft, NT, ND, + BS Extremities- no clubbing, cyanosis, or edema MS- no significant deformity or atrophy Skin- no rash or lesion Psych- euthymic mood, full affect Neuro- strength and sensation are intact  EKG-S brady with occasional PVC's at 59 bpm, t wave abnormality, consider later ischemia, pr int 120 ms, qrs int 82 ms, qtc 417 ms Epic records reviewed  Assessment and Plan: 1. PAF Maintaining SR on tikosyn 500 mg bid Bmet/mag today Continue xarelto 20 mg a day  2. H/o Carotid artery disease  Stable when checked recently with Dr. Marlou Porch    F/u in 3 months afib clinic for Tikosyn monitoring  Butch Penny C. Carroll, [facility] [address] [place], [place] [address] [phone]

## 2016-09-11 ENCOUNTER — Other Ambulatory Visit: Payer: Self-pay | Admitting: Cardiology

## 2016-09-11 DIAGNOSIS — I4892 Unspecified atrial flutter: Secondary | ICD-10-CM

## 2016-09-11 DIAGNOSIS — I10 Essential (primary) hypertension: Secondary | ICD-10-CM

## 2016-09-18 ENCOUNTER — Ambulatory Visit (INDEPENDENT_AMBULATORY_CARE_PROVIDER_SITE_OTHER): Payer: Medicare Other | Admitting: Nurse Practitioner

## 2016-09-18 VITALS — BP 122/80 | Temp 98.1°F | Wt 155.4 lb

## 2016-09-18 DIAGNOSIS — J069 Acute upper respiratory infection, unspecified: Secondary | ICD-10-CM | POA: Diagnosis not present

## 2016-09-18 DIAGNOSIS — B9689 Other specified bacterial agents as the cause of diseases classified elsewhere: Secondary | ICD-10-CM | POA: Diagnosis not present

## 2016-09-18 MED ORDER — AMOXICILLIN-POT CLAVULANATE 875-125 MG PO TABS: 1.0000 | ORAL_TABLET | Freq: Two times a day (BID) | ORAL | 0 refills | Status: DC

## 2016-09-18 MED ORDER — HYDROCODONE-HOMATROPINE 5-1.5 MG/5ML PO SYRP: 5.0000 mL | ORAL_SOLUTION | ORAL | 0 refills | Status: DC | PRN

## 2016-09-19 ENCOUNTER — Encounter: Payer: Self-pay | Admitting: Nurse Practitioner

## 2016-09-19 NOTE — Progress Notes (Signed)
Subjective:  Presents for complaints of cough and sore throat that began 3 days ago. No documented fever. Occasional headache. Clear runny nose. Frequent cough especially in the morning and at nighttime, mainly nonproductive. No ear pain or wheezing. No vomiting diarrhea acid reflux or abdominal pain.  Objective:   BP 122/80   Temp 98.1 F (36.7 C) (Oral)   Wt 155 lb 6.4 oz (70.5 kg)   BMI 25.86 kg/m  NAD. Alert, oriented. TMs clear effusion, no erythema. Pharynx mildly erythematous with PND noted. Neck supple with mild soft anterior adenopathy. Lungs clear. Heart regular rate rhythm.  Assessment:  Bacterial upper respiratory infection    Plan:  Meds ordered this encounter  Medications  . HYDROcodone-homatropine (HYCODAN) 5-1.5 MG/5ML syrup    Sig: Take 5 mLs by mouth every 4 (four) hours as needed.    Dispense:  120 mL    Refill:  0    Order Specific Question:   Supervising Provider    Answer:   Mikey Kirschner [[id number]]  . amoxicillin-clavulanate (AUGMENTIN) 875-125 MG tablet    Sig: Take 1 tablet by mouth 2 (two) times daily.    Dispense:  20 tablet    Refill:  0    Order Specific Question:   Supervising Provider    Answer:   Mikey Kirschner [[id number]]   Drowsiness precautions with cough syrup. OTC meds as recommended for daytime use. Call back if symptoms worsen or persist.

## 2016-09-29 ENCOUNTER — Other Ambulatory Visit: Payer: Self-pay | Admitting: Family Medicine

## 2016-09-29 NOTE — Telephone Encounter (Signed)
[place] for three mo worth

## 2016-11-07 ENCOUNTER — Telehealth: Payer: Self-pay | Admitting: Family Medicine

## 2016-11-07 MED ORDER — ATORVASTATIN CALCIUM 40 MG PO TABS: 40.0000 mg | ORAL_TABLET | Freq: Every day | ORAL | 1 refills | Status: DC

## 2016-11-07 NOTE — Telephone Encounter (Signed)
Prescription sent electronically to pharmacy. Patient notified. 

## 2016-11-07 NOTE — Telephone Encounter (Signed)
Pt is needing 90 day supply refills sent in on his atorvastatin (LIPITOR) 40 MG tablet   [facility] [place]

## 2016-11-09 ENCOUNTER — Ambulatory Visit (HOSPITAL_COMMUNITY)
Admission: RE | Admit: 2016-11-09 | Discharge: 2016-11-09 | Disposition: A | Discharge status: Home / Self Care | Payer: Medicare Other | Source: Ambulatory Visit | Attending: Nurse Practitioner | Admitting: Nurse Practitioner

## 2016-11-09 VITALS — BP 164/98 | HR 75 | Ht 65.0 in | Wt 159.0 lb

## 2016-11-09 DIAGNOSIS — J449 Chronic obstructive pulmonary disease, unspecified: Secondary | ICD-10-CM | POA: Diagnosis not present

## 2016-11-09 DIAGNOSIS — Z951 Presence of aortocoronary bypass graft: Secondary | ICD-10-CM | POA: Diagnosis not present

## 2016-11-09 DIAGNOSIS — I2510 Atherosclerotic heart disease of native coronary artery without angina pectoris: Secondary | ICD-10-CM | POA: Diagnosis not present

## 2016-11-09 DIAGNOSIS — I10 Essential (primary) hypertension: Secondary | ICD-10-CM | POA: Insufficient documentation

## 2016-11-09 DIAGNOSIS — Z7901 Long term (current) use of anticoagulants: Secondary | ICD-10-CM | POA: Insufficient documentation

## 2016-11-09 DIAGNOSIS — I481 Persistent atrial fibrillation: Secondary | ICD-10-CM | POA: Diagnosis not present

## 2016-11-09 DIAGNOSIS — I6523 Occlusion and stenosis of bilateral carotid arteries: Secondary | ICD-10-CM | POA: Diagnosis not present

## 2016-11-09 DIAGNOSIS — I480 Paroxysmal atrial fibrillation: Secondary | ICD-10-CM | POA: Insufficient documentation

## 2016-11-09 DIAGNOSIS — I4819 Other persistent atrial fibrillation: Secondary | ICD-10-CM

## 2016-11-09 DIAGNOSIS — Z87891 Personal history of nicotine dependence: Secondary | ICD-10-CM | POA: Insufficient documentation

## 2016-11-09 LAB — BASIC METABOLIC PANEL
ANION GAP: 7 (ref 5–15)
BUN: 14 mg/dL (ref 6–20)
CO2: 28 mmol/L (ref 22–32)
Calcium: 9.1 mg/dL (ref 8.9–10.3)
Chloride: 105 mmol/L (ref 101–111)
Creatinine, Ser: 1.21 mg/dL (ref 0.61–1.24)
GFR calc Af Amer: >60 mL/min (ref >60)
GFR calc non Af Amer: 58 mL/min — ABNORMAL LOW (ref >60)
GLUCOSE: 101 mg/dL — AB (ref 65–99)
POTASSIUM: 4 mmol/L (ref 3.5–5.1)
Sodium: 140 mmol/L (ref 135–145)

## 2016-11-09 LAB — MAGNESIUM: MAGNESIUM: 2 mg/dL (ref 1.7–2.4)

## 2016-11-09 NOTE — Progress Notes (Signed)
Patient ID: [name], male   DOB: June 15, 1946, 71 y.o.   MRN: [id number]     Primary Care Physician: Mickie Hillier, MD Referring Physician:Dr. Jachob Mcclean is a 71 y.o. male with a h/o PAF that underwent tikosyn loading at [facility] 5/15 for f/u. He reports that he is doing well and has not had any further issues with afib. He is being compliant with tikosyn as well as xarelto. 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  Return afib clinic 12/14, he not not had any afib. He feels well. QTc stable. He recently had a check up with Dr. Marlou Porch and felt to be stable.  Return to afib clinic 3/15. He has not had any afib that he is aware of. No issues with anticoagulant or tikosyn. BP is up today, states better at home. BP 160/84 recheck.   Today, he denies symptoms of palpitations, chest pain, shortness of breath, orthopnea, PND, lower extremity edema, dizziness, presyncope, syncope, or neurologic sequela. The patient is tolerating medications without difficulties and is otherwise without complaint today.   Past Medical History:  Diagnosis Date  . Atrial fibrillation ([place])   . Bilateral carotid artery disease ([place]) 07/21/2013  . CAD (coronary artery disease)   . COPD (chronic obstructive pulmonary disease) ([place])   . Gastritis   . Headache(784.0)   . Hypertension   . Visit for monitoring Tikosyn therapy 12/2015   Past Surgical History:  Procedure Laterality Date  . CARDIOVERSION N/A 09/08/2015   Procedure: CARDIOVERSION;  Surgeon: Thayer Headings, MD;  Location: [place];  Service: Cardiovascular;  Laterality: N/A;  . CORONARY ARTERY BYPASS GRAFT    . INCISION AND DRAINAGE DEEP NECK ABSCESS    . ROTATOR CUFF REPAIR Bilateral   . TEE WITHOUT CARDIOVERSION N/A 08/02/2015   Procedure: TRANSESOPHAGEAL ECHOCARDIOGRAM (TEE);   Surgeon: Sueanne Margarita, MD;  [facility] ENDOSCOPY;  Service: Cardiovascular;  Laterality: N/A;  . TEE WITHOUT CARDIOVERSION N/A 09/08/2015   Procedure: TRANSESOPHAGEAL ECHOCARDIOGRAM (TEE);  Surgeon: Thayer Headings, MD;  Location: [facility] ENDOSCOPY;  Service: Cardiovascular;  Laterality: N/A;    Current Outpatient Prescriptions  Medication Sig Dispense Refill  . ALPRAZolam (XANAX) 0.25 MG tablet TAKE ONE-HALF TO ONE TABLET BY MOUTH TWICE DAILY AS NEEDED 30 tablet 2  . amoxicillin-clavulanate (AUGMENTIN) 875-125 MG tablet Take 1 tablet by mouth 2 (two) times daily. 20 tablet 0  . atorvastatin (LIPITOR) 40 MG tablet Take 1 tablet (40 mg total) by mouth daily. 90 tablet 1  . dofetilide (TIKOSYN) 500 MCG capsule Take 1 capsule (500 mcg total) by mouth 2 (two) times daily. 180 capsule 3  . lisinopril (PRINIVIL,ZESTRIL) 20 MG tablet Take 1 tablet (20 mg total) by mouth daily. 180 tablet 1  . Multiple Vitamins-Minerals (MULTIVITAMIN WITH MINERALS) tablet Take 1 tablet by mouth at bedtime.     . nitroGLYCERIN (NITROSTAT) 0.4 MG SL tablet Place 0.4 mg under the tongue every 5 (five) minutes as needed for chest pain (x 3 doses).    . Omega-3 Fatty Acids (FISH OIL) 1200 MG CAPS Take 1,200 mg by mouth at bedtime.     . potassium chloride (K-DUR) 10 MEQ tablet Take 1 tablet (10 mEq total) by mouth daily. 30 tablet 6  . XARELTO 20 MG TABS tablet TAKE ONE  TABLET BY MOUTH DAILY WITH  SUPPER 30 tablet 6  . HYDROcodone-homatropine (HYCODAN) 5-1.5 MG/5ML syrup Take 5 mLs by mouth every 4 (four) hours as needed. (Patient not taking: Reported on 11/09/2016) 120 mL 0  . vitamin C (ASCORBIC ACID) 500 MG tablet Take 500 mg by mouth at bedtime.      No current facility-administered medications for this encounter.     Allergies  Allergen Reactions  . Doxycycline Shortness Of Breath    Also has to urinate more often  . Nsaids Hypertension    Gastritis     Social History   Social History  . Marital status:  Married    Spouse name: N/A  . Number of children: N/A  . Years of education: N/A   Occupational History  . Not on file.   Social History Main Topics  . Smoking status: Former Smoker    Quit date: 08/28/2006  . Smokeless tobacco: Never Used  . Alcohol use No     Comment: quit drinking in the 70's  . Drug use: No  . Sexual activity: Not on file   Other Topics Concern  . Not on file   Social History Narrative  . No narrative on file    Family History  Problem Relation Age of Onset  . Diabetes Mother   . Hypertension Mother   . Heart attack Father   . Sudden death Father   . Hypertension Sister   . Diabetes Sister     ROS- All systems are reviewed and negative except as per the HPI above  Physical Exam: Vitals:   11/09/16 0928  BP: (!) 164/98  Pulse: 75  Weight: 159 lb (72.1 kg)  Height: 5\' 5"  (1.651 m)    GEN- The patient is well appearing, alert and oriented x 3 today.   Head- normocephalic, atraumatic Eyes-  Sclera clear, conjunctiva pink Ears- hearing intact Oropharynx- clear Neck- supple, no JVP Lymph- no cervical lymphadenopathy Lungs- Clear to ausculation bilaterally, normal work of breathing Heart- Regular rate and rhythm, no murmurs, rubs or gallops, PMI not laterally displaced GI- soft, NT, ND, + BS Extremities- no clubbing, cyanosis, or edema MS- no significant deformity or atrophy Skin- no rash or lesion Psych- euthymic mood, full affect Neuro- strength and sensation are intact  EKG-Sinus rhythm at 75 bpm, pr int 134 ms, qrs int 82 bpm, qtc 442 ms (stable) Epic records reviewed  Assessment and Plan: 1. PAF Maintaining SR on tikosyn 500 mg bid Bmet/mag today Continue xarelto 20 mg a day  2. H/o Carotid artery disease  Stable when checked recently with Dr. Marlou Porch    F/u in 3-4 months afib clinic for Tikosyn monitoring  Butch Penny C. Kenslee Achorn, [facility] [address] [place], [place]  [address] [phone]

## 2016-12-05 ENCOUNTER — Other Ambulatory Visit (HOSPITAL_COMMUNITY): Payer: Self-pay | Admitting: Nurse Practitioner

## 2016-12-07 DIAGNOSIS — D126 Benign neoplasm of colon, unspecified: Secondary | ICD-10-CM | POA: Diagnosis not present

## 2016-12-07 DIAGNOSIS — K5730 Diverticulosis of large intestine without perforation or abscess without bleeding: Secondary | ICD-10-CM | POA: Diagnosis not present

## 2016-12-07 DIAGNOSIS — K635 Polyp of colon: Secondary | ICD-10-CM | POA: Diagnosis not present

## 2016-12-07 DIAGNOSIS — Z86010 Personal history of colonic polyps: Secondary | ICD-10-CM | POA: Diagnosis not present

## 2016-12-12 DIAGNOSIS — K635 Polyp of colon: Secondary | ICD-10-CM | POA: Diagnosis not present

## 2016-12-12 DIAGNOSIS — D126 Benign neoplasm of colon, unspecified: Secondary | ICD-10-CM | POA: Diagnosis not present

## 2016-12-28 ENCOUNTER — Other Ambulatory Visit: Payer: Self-pay | Admitting: Family Medicine

## 2016-12-28 NOTE — Telephone Encounter (Signed)
Ok six mo worth 

## 2017-01-09 ENCOUNTER — Telehealth: Payer: Self-pay | Admitting: Family Medicine

## 2017-01-09 NOTE — Telephone Encounter (Signed)
Pt is requesting lab orders to be sent over to [facility]. Last labs per epic were: magnesium,bmp on 11/09/16.

## 2017-01-25 ENCOUNTER — Telehealth: Payer: Self-pay | Admitting: Family Medicine

## 2017-01-25 DIAGNOSIS — I10 Essential (primary) hypertension: Secondary | ICD-10-CM

## 2017-01-25 DIAGNOSIS — Z125 Encounter for screening for malignant neoplasm of prostate: Secondary | ICD-10-CM

## 2017-01-25 DIAGNOSIS — E7800 Pure hypercholesterolemia, unspecified: Secondary | ICD-10-CM

## 2017-01-25 NOTE — Telephone Encounter (Signed)
Pt is requesting lab orders to be sent over for an upcoming appt. Last labs per epic were: magnesium,bmp on 11/09/16

## 2017-01-25 NOTE — Telephone Encounter (Signed)
Blood work ordered in EPIC. Patient notified. 

## 2017-01-25 NOTE — Telephone Encounter (Signed)
Lip liv m7 psa 

## 2017-01-26 ENCOUNTER — Ambulatory Visit (INDEPENDENT_AMBULATORY_CARE_PROVIDER_SITE_OTHER): Payer: Medicare Other | Admitting: Cardiology

## 2017-01-26 ENCOUNTER — Encounter: Payer: Self-pay | Admitting: Cardiology

## 2017-01-26 VITALS — BP 118/84 | HR 46 | Ht 65.0 in | Wt 155.6 lb

## 2017-01-26 DIAGNOSIS — I739 Peripheral vascular disease, unspecified: Secondary | ICD-10-CM

## 2017-01-26 DIAGNOSIS — Z7901 Long term (current) use of anticoagulants: Secondary | ICD-10-CM

## 2017-01-26 DIAGNOSIS — I779 Disorder of arteries and arterioles, unspecified: Secondary | ICD-10-CM | POA: Diagnosis not present

## 2017-01-26 NOTE — Progress Notes (Signed)
[place]. [address] [place], [place]  [address] Phone: [phone] Fax:  [phone]  Date:  01/26/2017   ID:  Dawan, Farney 1945/10/04, MRN [id number]  PCP:  Mikey Kirschner, MD   History of Present Illness: BERLE FITZ is a 71 y.o. male with paroxysmal atrial fibrillation that underwent Tikosyn loading on 01/10/16, followed by [name]. Has been taking both Xarelto and Tikosyn.  He works as an Clinical biochemist.   He has been feeling really good since being on Tikosyn and restoration of sinus rhythm has taken place. His ejection fraction has improved. Previously 30-35%, now 45-50%. No longer on diuretic.  Prior TEE cardioversion. Has CAD status post CABG in 2008.  COPD, tobacco use as well as carotid artery disease. Hyperlipidemia treated with pravastatin.   Original TEE cardioversion was canceled because a possible LA thrombus. Repeat looked okay. Successful cardioversion.  Chest x-ray showed COPD.  Stress test February of 2012 was low risk with no ischemia. Reassuring. This was prior to rotator cuff surgery.  Carotid artery duplex -Stable RICA 77%, LICA now 93-90% mildly progressed.   01/26/17-overall doing quite well, no syncope. He did have one episode of dizziness when driving, lasting about 10 seconds see states. He pulled over immediately. He has not had any since. No chest pain, no significant shortness of breath. He is still helping out with electrician work   [employer] from Last 3 Encounters:  01/26/17 155 lb 9.6 oz (70.6 kg)  11/09/16 159 lb (72.1 kg)  09/18/16 155 lb 6.4 oz (70.5 kg)     Past Medical History:  Diagnosis Date  . Atrial fibrillation ([place])   . Bilateral carotid artery disease ([place]) 07/21/2013  . CAD (coronary artery disease)   . COPD (chronic obstructive pulmonary disease) ([place])   . Gastritis   . Headache(784.0)   . Hypertension   . Visit for monitoring Tikosyn therapy 12/2015    Past Surgical History:  Procedure  Laterality Date  . CARDIOVERSION N/A 09/08/2015   Procedure: CARDIOVERSION;  Surgeon: Thayer Headings, MD;  Location: [place];  Service: Cardiovascular;  Laterality: N/A;  . CORONARY ARTERY BYPASS GRAFT    . INCISION AND DRAINAGE DEEP NECK ABSCESS    . ROTATOR CUFF REPAIR Bilateral   . TEE WITHOUT CARDIOVERSION N/A 08/02/2015   Procedure: TRANSESOPHAGEAL ECHOCARDIOGRAM (TEE);  Surgeon: Sueanne Margarita, MD;  Location: [facility] ENDOSCOPY;  Service: Cardiovascular;  Laterality: N/A;  . TEE WITHOUT CARDIOVERSION N/A 09/08/2015   Procedure: TRANSESOPHAGEAL ECHOCARDIOGRAM (TEE);  Surgeon: Thayer Headings, MD;  Location: [facility] ENDOSCOPY;  Service: Cardiovascular;  Laterality: N/A;    Current Outpatient Prescriptions  Medication Sig Dispense Refill  . ALPRAZolam (XANAX) 0.25 MG tablet TAKE ONE-HALF TO ONE TABLET BY MOUTH TWICE DAILY AS NEEDED 30 tablet 5  . atorvastatin (LIPITOR) 40 MG tablet Take 1 tablet (40 mg total) by mouth daily. 90 tablet 1  . dofetilide (TIKOSYN) 500 MCG capsule Take 1 capsule (500 mcg total) by mouth 2 (two) times daily. 180 capsule 3  . lisinopril (PRINIVIL,ZESTRIL) 20 MG tablet Take 1 tablet (20 mg total) by mouth daily. 180 tablet 1  . Multiple Vitamins-Minerals (MULTIVITAMIN WITH MINERALS) tablet Take 1 tablet by mouth at bedtime.     . Omega-3 Fatty Acids (FISH OIL) 1200 MG CAPS Take 1,200 mg by mouth at bedtime.     . potassium chloride (K-DUR) 10 MEQ tablet TAKE ONE TABLET BY MOUTH ONCE DAILY 30  tablet 6  . vitamin C (ASCORBIC ACID) 500 MG tablet Take 500 mg by mouth at bedtime.     [name] 20 MG TABS tablet TAKE ONE TABLET BY MOUTH DAILY WITH  SUPPER 30 tablet 6   No current facility-administered medications for this visit.     Allergies:    Allergies  Allergen Reactions  . Doxycycline Shortness Of Breath    Also has to urinate more often  . Nsaids Hypertension    Gastritis     Social History:  The patient  reports that he quit smoking about 10 years ago. He  has never used smokeless tobacco. He reports that he does not drink alcohol or use drugs.   ROS:  Please see the history of present illness.   Denies syncope, bleeding, orthopnea, PND he does have easy bruising however. PHYSICAL EXAM: VS:  BP 118/84   Pulse (!) 46   Ht 5\' 5"  (1.651 m)   Wt 155 lb 9.6 oz (70.6 kg)   BMI 25.89 kg/m  GEN: Well nourished, well developed, in no acute distress  HEENT: normal  Neck: no JVD, carotid bruits, or masses Cardiac: RRR/brady rhythm; no murmurs, rubs, or gallops,no edema  Respiratory:  clear to auscultation bilaterally, normal work of breathing GI: soft, nontender, nondistended, + BS MS: no deformity or atrophy  Skin: warm and dry, no rash Neuro:  Alert and Oriented x 3, Strength and sensation are intact Psych: euthymic mood, full affect   EKG: 09/10/15-sinus bradycardia with premature supraventricular complexes, PA-C, 57, T-wave inversion inferiorly as well as precordial leads lateral. Nonspecific. Personally viewed-prior 07/24/14-sinus rhythm, 62, nonspecific ST-T wave changes, ounce living ST segment consider inferior/anterolateral ischemia.prior NSR, 62, NSSTW changes.     TEE 09/08/15:  - Ejection fraction 35-40%  - No obvious thrombus  Echo 12/29/15: - Left ventricle: The cavity size was normal. Wall thickness was   normal. Systolic function was mildly reduced. The estimated   ejection fraction was in the range of 45% to 50%. Abnormal GLPSS   at -13% with inferoseptal strain abnormality. The study is not   technically sufficient to allow evaluation of LV diastolic   function. - Mitral valve: Mildly thickened leaflets . There was trivial   regurgitation. - Left atrium: Severely dilated at 51 ml/m2. - Right ventricle: The cavity size was mildly dilated. - Right atrium: The atrium was mildly dilated. - Tricuspid valve: There was trivial regurgitation. - Pulmonary arteries: PA peak pressure: 21 mm Hg (S). - Inferior vena cava: The vessel was  normal in size. The   respirophasic diameter changes were in the normal range (>= 50%),   consistent with normal central venous pressure.  Impressions:  - Compared to the most recent echo in 08/2015, the LVEF is slightly   improved at 45-50% wtih inferoseptal wall motion and strain   abnormalities.  Stress test February of 2012 was low risk with no ischemia. Reassuring. This was prior to rotator cuff surgery.  Carotid artery duplex 07/28/16: Heterogeneous plaque, bilaterally.  Stable 60-79% RICA stenosis.  Stable 48-27% LICA stenosis.  Essentially stable >50% stenosis in the bilateral ECA's.  Normal subclavian arteries, bilaterally.  Patent vertebral arteries with antegrade flow.  F/U 1 year    ASSESSMENT AND PLAN:  Atrial flutter- 2:1 /prior atrial fibrillation paroxysmal - NOAC, Xarelto 20 mg once a day, no change  - Successful TEE cardioversion on 09/08/15  - EF 35-40%-improved to 50% on echocardiogram in May 2017 after resolution of atrial fibrillation.  -  Tikosyn 500 mg twice a day to maintain rhythm. Working well. Appreciate Dr. Rayann Heman, [name] in atrial fibrillation clinic. She is going to be seeing him in July.  COPD  - Noted on chest x-ray  - Smoking history, overall doing well.  CAD post bypass  - 2008  - Nuclear stress test 2012 reassuring, no changes.  Prior cardiomyopathy  - EF is improved after restoration of sinus rhythm  - No need for Lasix. Feeling better. Excellent.   Carotid artery disease  -stable. Continuing to monitor every 12 months. Asymptomatic, Statin. 79%.  Chronic anticoagulation- CHADS-VASc 3  Hypertension-currently reasonable controlled. Continue to monitor closely.   Hyperlipidemia-continue with atorvastatin. LDL <70.   Follow-up in 12 months with me. He is seeing [name], NP in July.   Signed, Candee Furbish, MD [facility]  01/26/2017 10:47 AM

## 2017-01-26 NOTE — Patient Instructions (Signed)

## 2017-01-29 ENCOUNTER — Ambulatory Visit: Payer: Medicare Other | Admitting: Cardiology

## 2017-01-29 DIAGNOSIS — E7800 Pure hypercholesterolemia, unspecified: Secondary | ICD-10-CM | POA: Diagnosis not present

## 2017-01-29 DIAGNOSIS — Z125 Encounter for screening for malignant neoplasm of prostate: Secondary | ICD-10-CM | POA: Diagnosis not present

## 2017-01-29 DIAGNOSIS — I10 Essential (primary) hypertension: Secondary | ICD-10-CM | POA: Diagnosis not present

## 2017-01-30 LAB — HEPATIC FUNCTION PANEL
ALT: 16 IU/L (ref 0–44)
AST: 28 IU/L (ref 0–40)
Albumin: 4.1 g/dL (ref 3.5–4.8)
Alkaline Phosphatase: 70 IU/L (ref 39–117)
BILIRUBIN, DIRECT: 0.18 mg/dL (ref 0.00–0.40)
Bilirubin Total: 0.6 mg/dL (ref 0.0–1.2)
Total Protein: 6.7 g/dL (ref 6.0–8.5)

## 2017-01-30 LAB — BASIC METABOLIC PANEL
BUN/Creatinine Ratio: 12 (ref 10–24)
BUN: 14 mg/dL (ref 8–27)
CALCIUM: 9.2 mg/dL (ref 8.6–10.2)
CO2: 24 mmol/L (ref 18–29)
Chloride: 107 mmol/L — ABNORMAL HIGH (ref 96–106)
Creatinine, Ser: 1.15 mg/dL (ref 0.76–1.27)
GFR, EST AFRICAN AMERICAN: 73 mL/min/{1.73_m2} (ref >59)
GFR, EST NON AFRICAN AMERICAN: 63 mL/min/{1.73_m2} (ref >59)
Glucose: 100 mg/dL — ABNORMAL HIGH (ref 65–99)
POTASSIUM: 4.8 mmol/L (ref 3.5–5.2)
Sodium: 141 mmol/L (ref 134–144)

## 2017-01-30 LAB — LIPID PANEL
Chol/HDL Ratio: 2.2 ratio (ref 0.0–5.0)
Cholesterol, Total: 118 mg/dL (ref 100–199)
HDL: 53 mg/dL (ref >39)
LDL Calculated: 52 mg/dL (ref 0–99)
TRIGLYCERIDES: 63 mg/dL (ref 0–149)
VLDL Cholesterol Cal: 13 mg/dL (ref 5–40)

## 2017-01-30 LAB — PSA: Prostate Specific Ag, Serum: 3 ng/mL (ref 0.0–4.0)

## 2017-02-05 ENCOUNTER — Telehealth: Payer: Self-pay | Admitting: Family Medicine

## 2017-02-05 ENCOUNTER — Encounter: Payer: Self-pay | Admitting: Family Medicine

## 2017-02-05 ENCOUNTER — Ambulatory Visit (INDEPENDENT_AMBULATORY_CARE_PROVIDER_SITE_OTHER): Payer: Medicare Other | Admitting: Family Medicine

## 2017-02-05 VITALS — BP 126/88 | Ht 66.25 in | Wt 154.0 lb

## 2017-02-05 DIAGNOSIS — Z0000 Encounter for general adult medical examination without abnormal findings: Secondary | ICD-10-CM

## 2017-02-05 DIAGNOSIS — E7800 Pure hypercholesterolemia, unspecified: Secondary | ICD-10-CM

## 2017-02-05 DIAGNOSIS — I10 Essential (primary) hypertension: Secondary | ICD-10-CM | POA: Diagnosis not present

## 2017-02-05 DIAGNOSIS — R972 Elevated prostate specific antigen [PSA]: Secondary | ICD-10-CM

## 2017-02-05 NOTE — Progress Notes (Signed)
Subjective:    Patient ID: [name], male    DOB: Jan 14, 1946, 71 y.o.   MRN: [id number]  HPI AWV- Annual Wellness Visit  The patient was seen for their annual wellness visit. The patient's past medical history, surgical history, and family history were reviewed. Pertinent vaccines were reviewed ( tetanus, pneumonia, shingles, flu) The patient's medication list was reviewed and updated.  The height and weight were entered. The patient's current BMI is: 24.67  Cognitive screening was completed. Outcome of Mini - Cog: pass  Falls within the past 6 months: none  Current tobacco usage: none (All patients who use tobacco were given written and verbal information on quitting)  Recent listing of emergency department/hospitalizations over the past year were reviewed.  current specialist the patient sees on a regular basis: cardiologist   Medicare annual wellness visit patient questionnaire was reviewed.  A written screening schedule for the patient for the next 5-10 years was given. Appropriate discussion of followup regarding next visit was discussed.  Reg walking and exercise   Patient compliant with insomnia medication. Generally takes most nights. No obvious morning drowsiness. Definitely helps patient sleep. Without it patient states would not get a good nights rest.  Results for orders placed or performed in visit on 01/25/17  Lipid panel  Result Value Ref Range   Cholesterol, Total 118 100 - 199 mg/dL   Triglycerides 63 0 - 149 mg/dL   HDL 53 >39 mg/dL   VLDL Cholesterol Cal 13 5 - 40 mg/dL   LDL Calculated 52 0 - 99 mg/dL   Chol/HDL Ratio 2.2 0.0 - 5.0 ratio  Hepatic function panel  Result Value Ref Range   Total Protein 6.7 6.0 - 8.5 g/dL   Albumin 4.1 3.5 - 4.8 g/dL   Bilirubin Total 0.6 0.0 - 1.2 mg/dL   Bilirubin, Direct 0.18 0.00 - 0.40 mg/dL   Alkaline Phosphatase 70 39 - 117 IU/L   AST 28 0 - 40 IU/L   ALT 16 0 - 44 IU/L  Basic metabolic panel  Result  Value Ref Range   Glucose 100 (H) 65 - 99 mg/dL   BUN 14 8 - 27 mg/dL   Creatinine, Ser 1.15 0.76 - 1.27 mg/dL   GFR calc non Af Amer 63 >59 mL/min/1.73   GFR calc Af Amer 73 >59 mL/min/1.73   BUN/Creatinine Ratio 12 10 - 24   Sodium 141 134 - 144 mmol/L   Potassium 4.8 3.5 - 5.2 mmol/L   Chloride 107 (H) 96 - 106 mmol/L   CO2 24 18 - 29 mmol/L   Calcium 9.2 8.6 - 10.2 mg/dL  PSA  Result Value Ref Range   Prostate Specific Ag, Serum 3.0 0.0 - 4.0 ng/mL      Review of Systems  Constitutional: Negative for activity change, appetite change and fever.  HENT: Negative for congestion and rhinorrhea.   Eyes: Negative for discharge.  Respiratory: Negative for cough and wheezing.   Cardiovascular: Negative for chest pain.  Gastrointestinal: Negative for abdominal pain, blood in stool and vomiting.  Genitourinary: Negative for difficulty urinating and frequency.  Musculoskeletal: Negative for neck pain.  Skin: Negative for rash.  Allergic/Immunologic: Negative for environmental allergies and food allergies.  Neurological: Negative for weakness and headaches.  Psychiatric/Behavioral: Negative for agitation.  All other systems reviewed and are negative.      Objective:   Physical Exam  Constitutional: He appears well-developed and well-nourished.  HENT:  Head: Normocephalic and atraumatic.  Right  Ear: External ear normal.  Left Ear: External ear normal.  Nose: Nose normal.  Mouth/Throat: Oropharynx is clear and moist.  Eyes: EOM are normal. Pupils are equal, round, and reactive to light.  Neck: Normal range of motion. Neck supple. No thyromegaly present.  Cardiovascular: Normal rate, regular rhythm and normal heart sounds.   No murmur heard. Heart rhythm irregular but good control rate  Pulmonary/Chest: Effort normal and breath sounds normal. No respiratory distress. He has no wheezes.  Abdominal: Soft. Bowel sounds are normal. He exhibits no distension and no mass. There is no  tenderness.  Genitourinary: Penis normal.  Genitourinary Comments: Prostate gland within normal limits  Musculoskeletal: Normal range of motion. He exhibits no edema.  Lymphadenopathy:    He has no cervical adenopathy.  Neurological: He is alert. He exhibits normal muscle tone.  Skin: Skin is warm and dry. No erythema.  Psychiatric: He has a normal mood and affect. His behavior is normal. Judgment normal.  Vitals reviewed.         Assessment & Plan:  Impression 1 wellness exam. Up-to-date on colonoscopy. Diet exercise discussed #2 hyperlipidemia. Status uncertain. Until we did blood work current lipid panel good for coronary artery disease history. #3 PSA though still within normal limits #triple in the last 12 months. Based on this feeling need to do urology referral rationale discussed with patient. #4 insomnia/anxiety we still maintained Xanax no obvious side effects plan recheck in 6 months. Diet exercise discussed. Urology referral. Blood work discussed.

## 2017-02-05 NOTE — Telephone Encounter (Signed)
Patient said he is going to be referred to urology and would like Dr. Dorina Hoyer.

## 2017-02-07 ENCOUNTER — Other Ambulatory Visit: Payer: Self-pay | Admitting: Family Medicine

## 2017-02-07 ENCOUNTER — Other Ambulatory Visit: Payer: Self-pay | Admitting: Nurse Practitioner

## 2017-02-12 ENCOUNTER — Encounter: Payer: Self-pay | Admitting: Family Medicine

## 2017-03-13 ENCOUNTER — Encounter (HOSPITAL_COMMUNITY): Payer: Self-pay | Admitting: Nurse Practitioner

## 2017-03-13 ENCOUNTER — Ambulatory Visit (HOSPITAL_COMMUNITY)
Admission: RE | Admit: 2017-03-13 | Discharge: 2017-03-13 | Disposition: A | Discharge status: Home / Self Care | Payer: Medicare Other | Source: Ambulatory Visit | Attending: Nurse Practitioner | Admitting: Nurse Practitioner

## 2017-03-13 VITALS — BP 162/98 | HR 67 | Ht 66.25 in | Wt 154.4 lb

## 2017-03-13 DIAGNOSIS — I480 Paroxysmal atrial fibrillation: Secondary | ICD-10-CM | POA: Diagnosis not present

## 2017-03-13 DIAGNOSIS — Z87891 Personal history of nicotine dependence: Secondary | ICD-10-CM | POA: Insufficient documentation

## 2017-03-13 DIAGNOSIS — I10 Essential (primary) hypertension: Secondary | ICD-10-CM | POA: Insufficient documentation

## 2017-03-13 DIAGNOSIS — R51 Headache: Secondary | ICD-10-CM | POA: Insufficient documentation

## 2017-03-13 DIAGNOSIS — J449 Chronic obstructive pulmonary disease, unspecified: Secondary | ICD-10-CM | POA: Diagnosis not present

## 2017-03-13 DIAGNOSIS — Z8249 Family history of ischemic heart disease and other diseases of the circulatory system: Secondary | ICD-10-CM | POA: Insufficient documentation

## 2017-03-13 DIAGNOSIS — Z833 Family history of diabetes mellitus: Secondary | ICD-10-CM | POA: Insufficient documentation

## 2017-03-13 DIAGNOSIS — Z951 Presence of aortocoronary bypass graft: Secondary | ICD-10-CM | POA: Diagnosis not present

## 2017-03-13 DIAGNOSIS — Z7902 Long term (current) use of antithrombotics/antiplatelets: Secondary | ICD-10-CM | POA: Insufficient documentation

## 2017-03-13 DIAGNOSIS — Z9889 Other specified postprocedural states: Secondary | ICD-10-CM | POA: Insufficient documentation

## 2017-03-13 DIAGNOSIS — I2510 Atherosclerotic heart disease of native coronary artery without angina pectoris: Secondary | ICD-10-CM | POA: Diagnosis not present

## 2017-03-13 DIAGNOSIS — Z886 Allergy status to analgesic agent status: Secondary | ICD-10-CM | POA: Diagnosis not present

## 2017-03-13 DIAGNOSIS — Z881 Allergy status to other antibiotic agents status: Secondary | ICD-10-CM | POA: Diagnosis not present

## 2017-03-13 LAB — BASIC METABOLIC PANEL
ANION GAP: 7 (ref 5–15)
BUN: 16 mg/dL (ref 6–20)
CALCIUM: 9.3 mg/dL (ref 8.9–10.3)
CO2: 26 mmol/L (ref 22–32)
Chloride: 107 mmol/L (ref 101–111)
Creatinine, Ser: 1.15 mg/dL (ref 0.61–1.24)
GFR calc Af Amer: >60 mL/min (ref >60)
GLUCOSE: 106 mg/dL — AB (ref 65–99)
Potassium: 4.3 mmol/L (ref 3.5–5.1)
SODIUM: 140 mmol/L (ref 135–145)

## 2017-03-13 LAB — MAGNESIUM: Magnesium: 2.4 mg/dL (ref 1.7–2.4)

## 2017-03-13 NOTE — Progress Notes (Signed)
Patient ID: [name], male   DOB: [birth year], 71 y.o.   MRN: [id number]     Primary Care Physician: Mikey Kirschner, MD Referring Physician:Dr. Dastan Krider is a 71 y.o. male with a h/o PAF that underwent tikosyn loading at [facility] 5/15 for f/u. He reports that he is doing well and has not had any further issues with afib. He is being compliant with tikosyn as well as xarelto. 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  Return afib clinic 12/14, he not not had any afib. He feels well. QTc stable. He recently had a check up with Dr. Marlou Porch andfelt to be stable.  Return to afib clinic 3/15. He has not had any afib that he is aware of. No issues with anticoagulant or tikosyn. BP is up today, states better at home. BP 160/84 recheck.  F/u in afib clinic 03/13/17. Pt feels well. No issues with tikosyn. He did recently strain his back and plans to see PCP  for this. He has not noted any afib. Continues with xarelto without any issues with bleeding. BP re checked at 144/80.   Today, he denies symptoms of palpitations, chest pain, shortness of breath, orthopnea, PND, lower extremity edema, dizziness, presyncope, syncope, or neurologic sequela. The patient is tolerating medications without difficulties and is otherwise without complaint today.   Past Medical History:  Diagnosis Date  . Atrial fibrillation ([place])   . Bilateral carotid artery disease ([place]) 07/21/2013  . CAD (coronary artery disease)   . COPD (chronic obstructive pulmonary disease) ([place])   . Gastritis   . Headache(784.0)   . Hypertension   . Visit for monitoring Tikosyn therapy 12/2015   Past Surgical History:  Procedure Laterality Date  . CARDIOVERSION N/A 09/08/2015   Procedure: CARDIOVERSION;  Surgeon: Thayer Headings, MD;  Location: [place];  Service:  Cardiovascular;  Laterality: N/A;  . CORONARY ARTERY BYPASS GRAFT    . INCISION AND DRAINAGE DEEP NECK ABSCESS    . ROTATOR CUFF REPAIR Bilateral   . TEE WITHOUT CARDIOVERSION N/A 08/02/2015   Procedure: TRANSESOPHAGEAL ECHOCARDIOGRAM (TEE);  Surgeon: Sueanne Margarita, MD;  Location: [facility] ENDOSCOPY;  Service: Cardiovascular;  Laterality: N/A;  . TEE WITHOUT CARDIOVERSION N/A 09/08/2015   Procedure: TRANSESOPHAGEAL ECHOCARDIOGRAM (TEE);  Surgeon: Thayer Headings, MD;  Location: [facility] ENDOSCOPY;  Service: Cardiovascular;  Laterality: N/A;    Current Outpatient Prescriptions  Medication Sig Dispense Refill  . ALPRAZolam (XANAX) 0.25 MG tablet TAKE ONE-HALF TO ONE TABLET BY MOUTH TWICE DAILY AS NEEDED 30 tablet 5  . atorvastatin (LIPITOR) 40 MG tablet TAKE 1 TABLET BY MOUTH ONCE DAILY 90 tablet 1  . dofetilide (TIKOSYN) 500 MCG capsule TAKE ONE CAPSULE BY MOUTH TWICE DAILY 180 capsule 3  . lisinopril (PRINIVIL,ZESTRIL) 20 MG tablet TAKE ONE TABLET BY MOUTH ONCE DAILY 90 tablet 3  . Multiple Vitamins-Minerals (MULTIVITAMIN WITH MINERALS) tablet Take 1 tablet by mouth at bedtime.     . Omega-3 Fatty Acids (FISH OIL) 1200 MG CAPS Take 1,200 mg by mouth at bedtime.     . potassium chloride (K-DUR) 10 MEQ tablet TAKE ONE TABLET BY MOUTH ONCE DAILY 30 tablet 6  . vitamin C (ASCORBIC ACID) 500 MG tablet Take 500 mg by mouth at bedtime.     [name]  XARELTO 20 MG TABS tablet TAKE ONE TABLET BY MOUTH DAILY WITH  SUPPER 30 tablet 6   No current facility-administered medications for this encounter.     Allergies  Allergen Reactions  . Doxycycline Shortness Of Breath    Also has to urinate more often  . Nsaids Hypertension    Gastritis     Social History   Social History  . Marital status: Married    Spouse name: N/A  . Number of children: N/A  . Years of education: N/A   Occupational History  . Not on file.   Social History Main Topics  . Smoking status: Former Smoker    Quit date: 08/28/2006  .  Smokeless tobacco: Never Used  . Alcohol use No     Comment: quit drinking in the 70's  . Drug use: No  . Sexual activity: Not on file   Other Topics Concern  . Not on file   Social History Narrative  . No narrative on file    Family History  Problem Relation Age of Onset  . Diabetes Mother   . Hypertension Mother   . Heart attack Father   . Sudden death Father   . Hypertension Sister   . Diabetes Sister     ROS- All systems are reviewed and negative except as per the HPI above  Physical Exam: Vitals:   03/13/17 0901  BP: (!) 162/98  Pulse: 67  Weight: 154 lb 6.4 oz (70 kg)  Height: 5' 6.25" (1.683 m)    GEN- The patient is well appearing, alert and oriented x 3 today.   Head- normocephalic, atraumatic Eyes-  Sclera clear, conjunctiva pink Ears- hearing intact Oropharynx- clear Neck- supple, no JVP Lymph- no cervical lymphadenopathy Lungs- Clear to ausculation bilaterally, normal work of breathing Heart- Regular rate and rhythm, no murmurs, rubs or gallops, PMI not laterally displaced GI- soft, NT, ND, + BS Extremities- no clubbing, cyanosis, or edema MS- no significant deformity or atrophy Skin- no rash or lesion Psych- euthymic mood, full affect Neuro- strength and sensation are intact  EKG-Sinus rhythm at 67 bpm, pr int 150 ms, qrs int 74 bpm, qtc 441 ms (stable) Epic records reviewed  Assessment and Plan: 1. Paroxysmal atrial fibrillation Maintaining SR on tikosyn 500 mg bid Bmet/mag today Continue xarelto 20 mg a day  2. H/o Carotid artery disease  Per Dr. Marlou Porch   F/u in 3-4 months afib clinic for Tikosyn monitoring  Butch Penny C. Carroll, [facility] [address] [place], [place] [address] [phone]

## 2017-04-11 ENCOUNTER — Ambulatory Visit (INDEPENDENT_AMBULATORY_CARE_PROVIDER_SITE_OTHER): Payer: Medicare Other | Admitting: Family Medicine

## 2017-04-11 ENCOUNTER — Encounter: Payer: Self-pay | Admitting: Family Medicine

## 2017-04-11 VITALS — BP 100/64 | Temp 98.1°F | Ht 66.25 in | Wt 152.0 lb

## 2017-04-11 DIAGNOSIS — J441 Chronic obstructive pulmonary disease with (acute) exacerbation: Secondary | ICD-10-CM | POA: Diagnosis not present

## 2017-04-11 MED ORDER — CEPHALEXIN 500 MG PO CAPS: 500.0000 mg | ORAL_CAPSULE | Freq: Three times a day (TID) | ORAL | 0 refills | Status: DC

## 2017-04-11 MED ORDER — ALBUTEROL SULFATE HFA 108 (90 BASE) MCG/ACT IN AERS
2.0000 | INHALATION_SPRAY | Freq: Four times a day (QID) | RESPIRATORY_TRACT | 2 refills | Status: DC | PRN
Start: 2017-04-11 — End: 2017-08-09

## 2017-04-11 NOTE — Progress Notes (Signed)
   Subjective:    Patient ID: [name], male    DOB: 1946-01-15, 71 y.o.   MRN: [id number]  Cough  This is a new problem. The current episode started in the past 7 days. The cough is productive of sputum. The symptoms are aggravated by dust and pollens.  Has not used any medications for the cough and congestion.Also states he has no energy.  Progressive cough and congetion kickig in the lat four days  Pos proc phlegm  Rattling in chest  appet dim  enegy dis   Some possible wheezing     Review of Systems  Respiratory: Positive for cough.   No headache, no major weight loss or weight gain, no chest pain no back pain abdominal pain no change in bowel habits complete ROS otherwise negative      Objective:   Physical Exam  Alert, mild malaise. Hydration good Vitals stable. frontal/ maxillary tenderness evident positive nasal congestion. pharynx normal neck supple  lungs clear/no crackles Faint wheezes. heart regular in rhythm       Assessment & Plan:  Impression rhinosinusitisPlus exacerbation of COPD with dominant reactive airways likely post viral, discussed with patient. plan antibiotics prescribed. Questions answered. Inhaler prescribed and use discussed Symptomatic care discussed. warning signs discussed. WSL

## 2017-04-30 ENCOUNTER — Other Ambulatory Visit: Payer: Self-pay | Admitting: Cardiology

## 2017-04-30 DIAGNOSIS — I10 Essential (primary) hypertension: Secondary | ICD-10-CM

## 2017-04-30 DIAGNOSIS — I4892 Unspecified atrial flutter: Secondary | ICD-10-CM

## 2017-05-08 ENCOUNTER — Ambulatory Visit (INDEPENDENT_AMBULATORY_CARE_PROVIDER_SITE_OTHER): Payer: Medicare Other | Admitting: Urology

## 2017-05-08 DIAGNOSIS — R972 Elevated prostate specific antigen [PSA]: Secondary | ICD-10-CM

## 2017-05-11 ENCOUNTER — Telehealth: Payer: Self-pay | Admitting: Family Medicine

## 2017-05-11 NOTE — Telephone Encounter (Signed)
Review PSA results faxed from [facility] in results folder.

## 2017-06-13 ENCOUNTER — Ambulatory Visit (HOSPITAL_COMMUNITY)
Admission: RE | Admit: 2017-06-13 | Discharge: 2017-06-13 | Disposition: A | Discharge status: Home / Self Care | Payer: Medicare Other | Source: Ambulatory Visit | Attending: Nurse Practitioner | Admitting: Nurse Practitioner

## 2017-06-13 ENCOUNTER — Other Ambulatory Visit (HOSPITAL_COMMUNITY): Payer: Self-pay | Admitting: *Deleted

## 2017-06-13 ENCOUNTER — Encounter (HOSPITAL_COMMUNITY): Payer: Self-pay | Admitting: Nurse Practitioner

## 2017-06-13 VITALS — BP 188/100 | HR 69 | Ht 66.25 in | Wt 150.8 lb

## 2017-06-13 DIAGNOSIS — I480 Paroxysmal atrial fibrillation: Secondary | ICD-10-CM

## 2017-06-13 DIAGNOSIS — Z87891 Personal history of nicotine dependence: Secondary | ICD-10-CM | POA: Insufficient documentation

## 2017-06-13 DIAGNOSIS — J449 Chronic obstructive pulmonary disease, unspecified: Secondary | ICD-10-CM | POA: Diagnosis not present

## 2017-06-13 DIAGNOSIS — Z9889 Other specified postprocedural states: Secondary | ICD-10-CM | POA: Diagnosis not present

## 2017-06-13 DIAGNOSIS — Z79899 Other long term (current) drug therapy: Secondary | ICD-10-CM | POA: Diagnosis not present

## 2017-06-13 DIAGNOSIS — I10 Essential (primary) hypertension: Secondary | ICD-10-CM | POA: Insufficient documentation

## 2017-06-13 LAB — BASIC METABOLIC PANEL
ANION GAP: 8 (ref 5–15)
BUN: 11 mg/dL (ref 6–20)
CALCIUM: 9 mg/dL (ref 8.9–10.3)
CO2: 27 mmol/L (ref 22–32)
Chloride: 103 mmol/L (ref 101–111)
Creatinine, Ser: 1.01 mg/dL (ref 0.61–1.24)
GFR calc non Af Amer: >60 mL/min (ref >60)
GLUCOSE: 98 mg/dL (ref 65–99)
POTASSIUM: 3.8 mmol/L (ref 3.5–5.1)
Sodium: 138 mmol/L (ref 135–145)

## 2017-06-13 LAB — MAGNESIUM: Magnesium: 2 mg/dL (ref 1.7–2.4)

## 2017-06-13 NOTE — Progress Notes (Addendum)
Patient ID: [name], male   DOB: 09/20/1945, 71 y.o.   MRN: [id number]     Primary Care Physician: Mikey Kirschner, MD Referring Physician:Dr. Ramel Tobon is a 71 y.o. male with a h/o PAF that underwent tikosyn loading at [facility] 01/10/16 for f/u. He reports that he is doing well and has not had any further issues with afib. He is being compliant with tikosyn as well as xarelto. 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 BP rechecked 158/86.   Today, he denies symptoms of palpitations, chest pain, shortness of breath, orthopnea, PND, lower extremity edema, dizziness, presyncope, syncope, or neurologic sequela. The patient is tolerating medications without difficulties and is otherwise without complaint today.   Past Medical History:  Diagnosis Date  . Atrial fibrillation ([place])   . Bilateral carotid artery disease ([place]) 07/21/2013  . CAD (coronary artery disease)   . COPD (chronic obstructive pulmonary disease) ([place])   . Gastritis   . Headache(784.0)   . Hypertension   . Visit for monitoring Tikosyn therapy 12/2015   Past Surgical History:  Procedure Laterality Date  . CARDIOVERSION N/A 09/08/2015   Procedure: CARDIOVERSION;  Surgeon: Thayer Headings, MD;  Location: [place];  Service: Cardiovascular;  Laterality: N/A;  . CORONARY ARTERY BYPASS GRAFT    . INCISION AND DRAINAGE DEEP NECK ABSCESS    . ROTATOR CUFF REPAIR Bilateral   . TEE WITHOUT CARDIOVERSION N/A 08/02/2015   Procedure: TRANSESOPHAGEAL ECHOCARDIOGRAM (TEE);  Surgeon: Sueanne Margarita, MD;  Location: [facility] ENDOSCOPY;  Service: Cardiovascular;  Laterality: N/A;  . TEE WITHOUT CARDIOVERSION N/A 09/08/2015   Procedure: TRANSESOPHAGEAL ECHOCARDIOGRAM (TEE);  Surgeon: Thayer Headings, MD;  Location: [facility] ENDOSCOPY;  Service:  Cardiovascular;  Laterality: N/A;    Current Outpatient Prescriptions  Medication Sig Dispense Refill  . albuterol (PROVENTIL HFA;VENTOLIN HFA) 108 (90 Base) MCG/ACT inhaler Inhale 2 puffs into the lungs every 6 (six) hours as needed for wheezing or shortness of breath. 1 Inhaler 2  . ALPRAZolam (XANAX) 0.25 MG tablet TAKE ONE-HALF TO ONE TABLET BY MOUTH TWICE DAILY AS NEEDED 30 tablet 5  . atorvastatin (LIPITOR) 40 MG tablet TAKE 1 TABLET BY MOUTH ONCE DAILY 90 tablet 1  . dofetilide (TIKOSYN) 500 MCG capsule TAKE ONE CAPSULE BY MOUTH TWICE DAILY 180 capsule 3  . lisinopril (PRINIVIL,ZESTRIL) 20 MG tablet TAKE ONE TABLET BY MOUTH ONCE DAILY 90 tablet 3  . Multiple Vitamins-Minerals (MULTIVITAMIN WITH MINERALS) tablet Take 1 tablet by mouth at bedtime.     . Omega-3 Fatty Acids (FISH OIL) 1200 MG CAPS Take 1,200 mg by mouth at bedtime.     . potassium chloride (K-DUR) 10 MEQ tablet TAKE ONE TABLET BY MOUTH ONCE DAILY 30 tablet 6  . vitamin C (ASCORBIC ACID) 500 MG tablet Take 500 mg by mouth at bedtime.     [name] 20 MG TABS tablet TAKE ONE TABLET BY MOUTH ONCE DAILY WITH  SUPPER 30 tablet 10   No current facility-administered medications for this encounter.     Allergies  Allergen Reactions  . Doxycycline Shortness Of Breath    Also has to urinate more often  . Nsaids Hypertension    Gastritis     Social History   Social History  .  Marital status: Married    Spouse name: N/A  . Number of children: N/A  . Years of education: N/A   Occupational History  . Not on file.   Social History Main Topics  . Smoking status: Former Smoker    Quit date: 08/28/2006  . Smokeless tobacco: Never Used  . Alcohol use No     Comment: quit drinking in the 70's  . Drug use: No  . Sexual activity: Not on file   Other Topics Concern  . Not on file   Social History Narrative  . No narrative on file    Family History  Problem Relation Age of Onset  . Diabetes Mother   . Hypertension  Mother   . Heart attack Father   . Sudden death Father   . Hypertension Sister   . Diabetes Sister     ROS- All systems are reviewed and negative except as per the HPI above  Physical Exam: Vitals:   06/13/17 0919  BP: (!) 188/100  Pulse: 69  Weight: 150 lb 12.8 oz (68.4 kg)  Height: 5' 6.25" (1.683 m)    GEN- The patient is well appearing, alert and oriented x 3 today.   Head- normocephalic, atraumatic Eyes-  Sclera clear, conjunctiva pink Ears- hearing intact Oropharynx- clear Neck- supple, no JVP Lymph- no cervical lymphadenopathy Lungs- Clear to ausculation bilaterally, normal work of breathing Heart- Regular rate and rhythm, no murmurs, rubs or gallops, PMI not laterally displaced GI- soft, NT, ND, + BS Extremities- no clubbing, cyanosis, or edema MS- no significant deformity or atrophy Skin- no rash or lesion Psych- euthymic mood, full affect Neuro- strength and sensation are intact  EKG-Sinus rhythm at 69 bpm, pr int 142 ms, qrs int 76 bpm, qtc 465 ms (stable) Epic records reviewed  Assessment and Plan: 1. Paroxysmal atrial fibrillation Maintaining SR on tikosyn 500 mg bid Continue xarelto 20 mg a day for chadsvasc score of at least 3 bmet/mag  2. H/o Carotid artery disease  Per Dr. Marlou Porch  3. HTN Elevated in office Rechecked 158/86 States usually better controlled Under more work stress than usual Just took lisinopril 30 mins ago     F/u in  4 months afib clinic for Tikosyn monitoring Dr. Marlou Porch as scheduled  Geroge Baseman. Malikah Lakey, [facility] [address] [place], [place] [address] [phone]

## 2017-06-21 ENCOUNTER — Other Ambulatory Visit: Payer: Self-pay | Admitting: *Deleted

## 2017-06-21 DIAGNOSIS — I6523 Occlusion and stenosis of bilateral carotid arteries: Secondary | ICD-10-CM

## 2017-06-26 ENCOUNTER — Ambulatory Visit (HOSPITAL_COMMUNITY)
Admission: RE | Admit: 2017-06-26 | Discharge: 2017-06-26 | Disposition: A | Discharge status: Home / Self Care | Payer: Medicare Other | Source: Ambulatory Visit | Attending: Nurse Practitioner | Admitting: Nurse Practitioner

## 2017-06-26 DIAGNOSIS — I481 Persistent atrial fibrillation: Secondary | ICD-10-CM | POA: Diagnosis not present

## 2017-06-26 LAB — BASIC METABOLIC PANEL
Anion gap: 6 (ref 5–15)
BUN: 14 mg/dL (ref 6–20)
CALCIUM: 9.1 mg/dL (ref 8.9–10.3)
CHLORIDE: 106 mmol/L (ref 101–111)
CO2: 28 mmol/L (ref 22–32)
CREATININE: 1.14 mg/dL (ref 0.61–1.24)
GFR calc non Af Amer: >60 mL/min (ref >60)
Glucose, Bld: 97 mg/dL (ref 65–99)
Potassium: 4.6 mmol/L (ref 3.5–5.1)
SODIUM: 140 mmol/L (ref 135–145)

## 2017-06-28 ENCOUNTER — Other Ambulatory Visit (HOSPITAL_COMMUNITY): Payer: Self-pay | Admitting: Nurse Practitioner

## 2017-06-28 ENCOUNTER — Other Ambulatory Visit: Payer: Self-pay | Admitting: Family Medicine

## 2017-06-28 NOTE — Telephone Encounter (Signed)
1 refill 

## 2017-06-28 NOTE — Telephone Encounter (Signed)
Last seen 04/11/2017

## 2017-08-01 ENCOUNTER — Ambulatory Visit (HOSPITAL_COMMUNITY)
Admission: RE | Admit: 2017-08-01 | Discharge: 2017-08-01 | Disposition: A | Discharge status: Home / Self Care | Payer: Medicare Other | Source: Ambulatory Visit | Attending: Cardiovascular Disease | Admitting: Cardiovascular Disease

## 2017-08-01 DIAGNOSIS — I6523 Occlusion and stenosis of bilateral carotid arteries: Secondary | ICD-10-CM | POA: Insufficient documentation

## 2017-08-09 ENCOUNTER — Encounter: Payer: Self-pay | Admitting: Family Medicine

## 2017-08-09 ENCOUNTER — Ambulatory Visit (INDEPENDENT_AMBULATORY_CARE_PROVIDER_SITE_OTHER): Payer: Medicare Other | Admitting: Family Medicine

## 2017-08-09 VITALS — BP 178/98 | Ht 66.25 in | Wt 153.0 lb

## 2017-08-09 DIAGNOSIS — J441 Chronic obstructive pulmonary disease with (acute) exacerbation: Secondary | ICD-10-CM

## 2017-08-09 DIAGNOSIS — I10 Essential (primary) hypertension: Secondary | ICD-10-CM | POA: Diagnosis not present

## 2017-08-09 DIAGNOSIS — Z13220 Encounter for screening for lipoid disorders: Secondary | ICD-10-CM

## 2017-08-09 DIAGNOSIS — I6523 Occlusion and stenosis of bilateral carotid arteries: Secondary | ICD-10-CM

## 2017-08-09 DIAGNOSIS — Z23 Encounter for immunization: Secondary | ICD-10-CM

## 2017-08-09 DIAGNOSIS — E7800 Pure hypercholesterolemia, unspecified: Secondary | ICD-10-CM | POA: Diagnosis not present

## 2017-08-09 DIAGNOSIS — Z79899 Other long term (current) drug therapy: Secondary | ICD-10-CM

## 2017-08-09 MED ORDER — ATORVASTATIN CALCIUM 40 MG PO TABS: 40.0000 mg | ORAL_TABLET | Freq: Every day | ORAL | 1 refills | Status: DC

## 2017-08-09 MED ORDER — ALBUTEROL SULFATE HFA 108 (90 BASE) MCG/ACT IN AERS: 2.0000 | INHALATION_SPRAY | Freq: Four times a day (QID) | RESPIRATORY_TRACT | 2 refills | Status: DC | PRN

## 2017-08-09 NOTE — Progress Notes (Signed)
   Subjective:    Patient ID: [name], male    DOB: 05-25-1946, 71 y.o.   MRN: [id number]  HPI  Patient is here today for a follow up on HTN. He takes lisinopril 20 mg one daily.He eats healthy,but some times only eats once a day. He walks to get exercise. He also still has a job and gets exercise there as well.  Patient continues to take lipid medication regularly. No obvious side effects from it. Generally does not miss a dose. Prior blood work results are reviewed with patient. Patient continues to work on fat intake in diet  Blood pressure medicine and blood pressure levels reviewed today with patient. Compliant with blood pressure medicine. States does not miss a dose. No obvious side effects. Blood pressure generally good when checked elsewhere. Watching salt intake.   Hx ciopd sone sob lately  Patient compliant with insomnia medication. Generally takes most nights. No obvious morning drowsiness. Definitely helps patient sleep. Without it patient states would not get a good nights rest.  needs refill on meds     needs fku shot today  Review of Systems No headache, no major weight loss or weight gain, no chest pain no back pain abdominal pain no change in bowel habits complete ROS otherwise negative     Objective:   Physical Exam Alert and oriented, vitals reviewed and stable, NAD ENT-TM's and ext canals WNL bilat via otoscopic exam Soft palate, tonsils and post pharynx WNL via oropharyngeal exam Neck-symmetric, no masses; thyroid nonpalpable and nontender Pulmonary-no tachypnea or accessory muscle use; Clear without wheezes via auscultation Card--no abnrml murmurs, rhythm reg and rate WNL Carotid pulses symmetric, without bruits        Assessment & Plan:  Impressionw hypertension good control discussed to maintain same meds  Hyperlipidemia status uncertain discussed we will check blood work  COPD clinically stable unfortunately shortness of breath with  exertion  Insomnia ongoing need for medication  Flu shot today.  Appropriate blood work.  Diet exercise discussed.  Medications refilled. f u wellness plus chronic six mo

## 2017-08-10 ENCOUNTER — Telehealth: Payer: Self-pay | Admitting: Cardiology

## 2017-08-10 DIAGNOSIS — I6523 Occlusion and stenosis of bilateral carotid arteries: Secondary | ICD-10-CM

## 2017-08-10 LAB — LIPID PANEL
CHOL/HDL RATIO: 2.3 ratio (ref 0.0–5.0)
Cholesterol, Total: 134 mg/dL (ref 100–199)
HDL: 58 mg/dL (ref >39)
LDL CALC: 65 mg/dL (ref 0–99)
TRIGLYCERIDES: 56 mg/dL (ref 0–149)
VLDL CHOLESTEROL CAL: 11 mg/dL (ref 5–40)

## 2017-08-10 LAB — HEPATIC FUNCTION PANEL
ALBUMIN: 4.2 g/dL (ref 3.5–4.8)
ALT: 18 IU/L (ref 0–44)
AST: 28 IU/L (ref 0–40)
Alkaline Phosphatase: 79 IU/L (ref 39–117)
BILIRUBIN TOTAL: 0.5 mg/dL (ref 0.0–1.2)
Bilirubin, Direct: 0.17 mg/dL (ref 0.00–0.40)
Total Protein: 6.8 g/dL (ref 6.0–8.5)

## 2017-08-10 NOTE — Telephone Encounter (Signed)
Spoke with the pts wife [name] (on [place]) and informed her of the pts carotid US results, as indicated below:  Notes recorded by Jerline Pain, MD on 08/01/2017 at 5:20 PM EST Bilateral carotid stenosis moderate in severity Repeat in one year.   Candee Furbish, MD   Informed the pts Wife that I will place the repeat carotid US order in the system to have done in 1 year, and have one of our [facility] schedulers call her back to arrange this appt.  Wife verbalized understanding and agrees with this plan.  Wife states she will endorse results and plan to the pt.  Also informed the pts wife that per our Medical Records Rep, she will mail a release form for both her and the pt to sign, so they can receive test results to their mailing address.  Wife verbalized understanding and agreed to this plan as well.

## 2017-08-10 NOTE — Telephone Encounter (Signed)
[name] is returning a call . Please call .[name] Can leave message on answering machine and would also like a copy of his test results mailed to him . Thanks

## 2017-08-12 ENCOUNTER — Encounter: Payer: Self-pay | Admitting: Family Medicine

## 2017-08-15 NOTE — Telephone Encounter (Signed)
    Carotid ultrasound scheduled for 08-06-18 at 11am

## 2017-08-30 ENCOUNTER — Other Ambulatory Visit: Payer: Self-pay | Admitting: Family Medicine

## 2017-08-30 NOTE — Telephone Encounter (Signed)
Ok six mo 

## 2017-10-17 ENCOUNTER — Ambulatory Visit (HOSPITAL_COMMUNITY)
Admission: RE | Admit: 2017-10-17 | Discharge: 2017-10-17 | Disposition: A | Discharge status: Home / Self Care | Payer: Medicare Other | Source: Ambulatory Visit | Attending: Nurse Practitioner | Admitting: Nurse Practitioner

## 2017-10-17 ENCOUNTER — Encounter (HOSPITAL_COMMUNITY): Payer: Self-pay | Admitting: Nurse Practitioner

## 2017-10-17 VITALS — BP 158/78 | HR 71 | Ht 66.25 in | Wt 151.0 lb

## 2017-10-17 DIAGNOSIS — Z951 Presence of aortocoronary bypass graft: Secondary | ICD-10-CM | POA: Insufficient documentation

## 2017-10-17 DIAGNOSIS — Z7901 Long term (current) use of anticoagulants: Secondary | ICD-10-CM | POA: Diagnosis not present

## 2017-10-17 DIAGNOSIS — I10 Essential (primary) hypertension: Secondary | ICD-10-CM | POA: Insufficient documentation

## 2017-10-17 DIAGNOSIS — Z87891 Personal history of nicotine dependence: Secondary | ICD-10-CM | POA: Diagnosis not present

## 2017-10-17 DIAGNOSIS — J449 Chronic obstructive pulmonary disease, unspecified: Secondary | ICD-10-CM | POA: Diagnosis not present

## 2017-10-17 DIAGNOSIS — I2510 Atherosclerotic heart disease of native coronary artery without angina pectoris: Secondary | ICD-10-CM | POA: Diagnosis not present

## 2017-10-17 DIAGNOSIS — Z79899 Other long term (current) drug therapy: Secondary | ICD-10-CM | POA: Insufficient documentation

## 2017-10-17 DIAGNOSIS — I480 Paroxysmal atrial fibrillation: Secondary | ICD-10-CM

## 2017-10-17 LAB — MAGNESIUM: Magnesium: 2 mg/dL (ref 1.7–2.4)

## 2017-10-17 LAB — BASIC METABOLIC PANEL
Anion gap: 9 (ref 5–15)
BUN: 15 mg/dL (ref 6–20)
CHLORIDE: 107 mmol/L (ref 101–111)
CO2: 26 mmol/L (ref 22–32)
Calcium: 9.2 mg/dL (ref 8.9–10.3)
Creatinine, Ser: 1.14 mg/dL (ref 0.61–1.24)
Glucose, Bld: 93 mg/dL (ref 65–99)
POTASSIUM: 4 mmol/L (ref 3.5–5.1)
Sodium: 142 mmol/L (ref 135–145)

## 2017-10-17 NOTE — Progress Notes (Signed)
Patient ID: [name], male   DOB: [birth year], 72 y.o.   MRN: [id number]     Primary Care Physician: Mikey Kirschner, MD Referring Physician:Dr. Pat Sires is a 72 y.o. male with a h/o PAF that underwent tikosyn loading at [facility] 01/10/16 for f/u. He reports that he is doing well and has not had any further issues with afib. He is being compliant with tikosyn as well as xarelto. 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 BP rechecked 158/86.  F/u in afib clinic 2/20, pt is still enjoying NSR with Tikosyn. 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 He is not having any issues with xarelto.  No problems to discuss today.   Today, he denies symptoms of palpitations, chest pain, shortness of breath, orthopnea, PND, lower extremity edema, dizziness, presyncope, syncope, or neurologic sequela. The patient is tolerating medications without difficulties and is otherwise without complaint today.   Past Medical History:  Diagnosis Date  . Atrial fibrillation ([place])   . Bilateral carotid artery disease ([place]) 07/21/2013  . CAD (coronary artery disease)   . COPD (chronic obstructive pulmonary disease) ([place])   . Gastritis   . Headache(784.0)   . Hypertension   . Visit for monitoring Tikosyn therapy 12/2015   Past Surgical History:  Procedure Laterality Date  . CARDIOVERSION N/A 09/08/2015   Procedure: CARDIOVERSION;  Surgeon: Thayer Headings, MD;  Location: [facility];  Service: Cardiovascular;  Laterality: N/A;  . CORONARY ARTERY BYPASS GRAFT    . INCISION AND DRAINAGE DEEP NECK ABSCESS    . ROTATOR CUFF REPAIR Bilateral   . TEE WITHOUT CARDIOVERSION N/A 08/02/2015   Procedure: TRANSESOPHAGEAL ECHOCARDIOGRAM (TEE);  Surgeon: Sueanne Margarita, MD;  Location[facility] END[facility]COPY;  Service:  Cardiovascular;  Laterality: N/A;  . TEE WITHOUT CARDIOVERSION N/A 09/08/2015   Procedure: TRANSESOPHAGEAL ECHOCARDIOGRAM (TEE);  Surgeon: Thayer Headings, MD;  Location: [facility] ENDOSCOPY;  Service: Cardiovascular;  Laterality: N/A;    Current Outpatient Medications  Medication Sig Dispense Refill  . albuterol (PROVENTIL HFA;VENTOLIN HFA) 108 (90 Base) MCG/ACT inhaler Inhale 2 puffs into the lungs every 6 (six) hours as needed for wheezing or shortness of breath. 1 Inhaler 2  . ALPRAZolam (XANAX) 0.25 MG tablet TAKE 1/2 TO 1 (ONE-HALF TO ONE) TABLET BY MOUTH TWICE DAILY AS NEEDED 30 tablet 5  . atorvastatin (LIPITOR) 40 MG tablet Take 1 tablet (40 mg total) by mouth daily. 90 tablet 1  . dofetilide (TIKOSYN) 500 MCG capsule TAKE ONE CAPSULE BY MOUTH TWICE DAILY 180 capsule 3  . lisinopril (PRINIVIL,ZESTRIL) 20 MG tablet TAKE ONE TABLET BY MOUTH ONCE DAILY 90 tablet 3  . Multiple Vitamins-Minerals (MULTIVITAMIN WITH MINERALS) tablet Take 1 tablet by mouth at bedtime.     . Omega-3 Fatty Acids (FISH OIL) 1200 MG CAPS Take 1,200 mg by mouth at bedtime.     . potassium chloride (K-DUR) 10 MEQ tablet TAKE 1 TABLET BY MOUTH ONCE DAILY 90 tablet 2  . vitamin C (ASCORBIC ACID) 500 MG tablet Take 500 mg by mouth at bedtime.     [name] 20 MG TABS tablet TAKE ONE TABLET BY MOUTH ONCE DAILY WITH  SUPPER 30 tablet 10   No current facility-administered medications for this encounter.  Allergies  Allergen Reactions  . Doxycycline Shortness Of Breath    Also has to urinate more often  . Nsaids Hypertension    Gastritis     Social History   Socioeconomic History  . Marital status: Married    Spouse name: Not on file  . Number of children: Not on file  . Years of education: Not on file  . Highest education level: Not on file  Social Needs  . Financial resource strain: Not on file  . Food insecurity - worry: Not on file  . Food insecurity - inability: Not on file  . Transportation needs -  medical: Not on file  . Transportation needs - non-medical: Not on file  Occupational History  . Not on file  Tobacco Use  . Smoking status: Former Smoker    Last attempt to quit: 08/28/2006    Years since quitting: 11.1  . Smokeless tobacco: Never Used  Substance and Sexual Activity  . Alcohol use: No    Comment: quit drinking in the 70's  . Drug use: No  . Sexual activity: Not on file  Other Topics Concern  . Not on file  Social History Narrative  . Not on file    Family History  Problem Relation Age of Onset  . Diabetes Mother   . Hypertension Mother   . Heart attack Father   . Sudden death Father   . Hypertension Sister   . Diabetes Sister     ROS- All systems are reviewed and negative except as per the HPI above  Physical Exam: Vitals:   10/17/17 0900  BP: (!) 158/78  Pulse: 71  Weight: 151 lb (68.5 kg)  Height: 5' 6.25" (1.683 m)    GEN- The patient is well appearing, alert and oriented x 3 today.   Head- normocephalic, atraumatic Eyes-  Sclera clear, conjunctiva pink Ears- hearing intact Oropharynx- clear Neck- supple, no JVP Lymph- no cervical lymphadenopathy Lungs- Clear to ausculation bilaterally, normal work of breathing Heart- Regular rate and rhythm, no murmurs, rubs or gallops, PMI not laterally displaced GI- soft, NT, ND, + BS Extremities- no clubbing, cyanosis, or edema MS- no significant deformity or atrophy Skin- no rash or lesion Psych- euthymic mood, full affect Neuro- strength and sensation are intact  EKG-Sinus rhythm at 71 bpm, pr int 152 ms, qrs int 80 bpm, qtc 434 ms (stable) Epic records reviewed  Assessment and Plan: 1. Paroxysmal atrial fibrillation Maintaining SR on tikosyn 500 mg bid Continue xarelto 20 mg a day for chadsvasc score of at least 3 Bmet/mag today  2. H/o Carotid artery disease  Per Dr. Marlou Porch  3. HTN stable   Dr. Marlou Porch in June and I will see in September for Tikosyn monitoring   Geroge Baseman. Laneka Mcgrory,  [facility] [address] [place], [place] [address] [phone]

## 2017-11-16 ENCOUNTER — Encounter: Payer: Self-pay | Admitting: Family Medicine

## 2017-11-16 ENCOUNTER — Ambulatory Visit (INDEPENDENT_AMBULATORY_CARE_PROVIDER_SITE_OTHER): Payer: Medicare Other | Admitting: Family Medicine

## 2017-11-16 VITALS — BP 158/86 | Temp 98.1°F | Ht 66.25 in | Wt 154.0 lb

## 2017-11-16 DIAGNOSIS — B9689 Other specified bacterial agents as the cause of diseases classified elsewhere: Secondary | ICD-10-CM

## 2017-11-16 DIAGNOSIS — J441 Chronic obstructive pulmonary disease with (acute) exacerbation: Secondary | ICD-10-CM

## 2017-11-16 DIAGNOSIS — J019 Acute sinusitis, unspecified: Secondary | ICD-10-CM

## 2017-11-16 MED ORDER — AMOXICILLIN 500 MG PO CAPS: 500.0000 mg | ORAL_CAPSULE | Freq: Three times a day (TID) | ORAL | 0 refills | Status: DC

## 2017-11-16 NOTE — Progress Notes (Signed)
   Subjective:    Patient ID: [name], male    DOB: 09-Feb-1946, 72 y.o.   MRN: [id number]  HPI  Patient is here today with complaints of productive cough,congestion,runny nose,loss of voice,sorethroat on going since Tuesday. He has taken sudafed and seems to have helped some.  Family sick with flu like illness  Cough productive  Pos prodctn   Review of Systems No headache, no major weight loss or weight gain, no chest pain no back pain abdominal pain no change in bowel habits complete ROS otherwise negative     Objective:   Physical Exam  Alert, mild malaise. Hydration good Vitals stable. frontal/ maxillary tenderness evident positive nasal congestion. pharynx normal neck supple  lungs clear/no crackles /mild wheezes. heart regular in rhythm       Assessment & Plan:  Impression rhinosinusitis likely post viral, discussed with patient. plan antibiotics prescribed. Questions answered. Symptomatic care discussed. warning signs discussed. WSL

## 2017-12-04 ENCOUNTER — Other Ambulatory Visit: Payer: Self-pay | Admitting: Family Medicine

## 2017-12-04 ENCOUNTER — Telehealth: Payer: Self-pay | Admitting: Family Medicine

## 2017-12-04 DIAGNOSIS — Z79899 Other long term (current) drug therapy: Secondary | ICD-10-CM

## 2017-12-04 DIAGNOSIS — I10 Essential (primary) hypertension: Secondary | ICD-10-CM

## 2017-12-04 DIAGNOSIS — Z125 Encounter for screening for malignant neoplasm of prostate: Secondary | ICD-10-CM

## 2017-12-04 DIAGNOSIS — E7849 Other hyperlipidemia: Secondary | ICD-10-CM

## 2017-12-04 NOTE — Telephone Encounter (Signed)
Wife returned call;informed her that patients lab orders were placed

## 2017-12-04 NOTE — Telephone Encounter (Signed)
Lip liv m7 psa 

## 2017-12-04 NOTE — Telephone Encounter (Signed)
Pt has physical appointment 02/06/18 - needs lab work ordered, please include PSA  Please call pt when orders are ready  [phone]

## 2017-12-04 NOTE — Telephone Encounter (Signed)
Lab orders placed in Epic. Left message to return call

## 2018-01-17 DIAGNOSIS — Z125 Encounter for screening for malignant neoplasm of prostate: Secondary | ICD-10-CM | POA: Diagnosis not present

## 2018-01-17 DIAGNOSIS — E7849 Other hyperlipidemia: Secondary | ICD-10-CM | POA: Diagnosis not present

## 2018-01-17 DIAGNOSIS — I10 Essential (primary) hypertension: Secondary | ICD-10-CM | POA: Diagnosis not present

## 2018-01-17 DIAGNOSIS — Z79899 Other long term (current) drug therapy: Secondary | ICD-10-CM | POA: Diagnosis not present

## 2018-01-18 LAB — BASIC METABOLIC PANEL
BUN / CREAT RATIO: 20 (ref 10–24)
BUN: 23 mg/dL (ref 8–27)
CO2: 23 mmol/L (ref 20–29)
CREATININE: 1.15 mg/dL (ref 0.76–1.27)
Calcium: 8.9 mg/dL (ref 8.6–10.2)
Chloride: 107 mmol/L — ABNORMAL HIGH (ref 96–106)
GFR calc Af Amer: 72 mL/min/{1.73_m2} (ref >59)
GFR calc non Af Amer: 62 mL/min/{1.73_m2} (ref >59)
GLUCOSE: 92 mg/dL (ref 65–99)
Potassium: 4.9 mmol/L (ref 3.5–5.2)
Sodium: 145 mmol/L — ABNORMAL HIGH (ref 134–144)

## 2018-01-18 LAB — HEPATIC FUNCTION PANEL
ALT: 16 IU/L (ref 0–44)
AST: 22 IU/L (ref 0–40)
Albumin: 4.1 g/dL (ref 3.5–4.8)
Alkaline Phosphatase: 81 IU/L (ref 39–117)
BILIRUBIN TOTAL: 0.5 mg/dL (ref 0.0–1.2)
BILIRUBIN, DIRECT: 0.16 mg/dL (ref 0.00–0.40)
TOTAL PROTEIN: 6.7 g/dL (ref 6.0–8.5)

## 2018-01-18 LAB — LIPID PANEL
Chol/HDL Ratio: 2.3 ratio (ref 0.0–5.0)
Cholesterol, Total: 117 mg/dL (ref 100–199)
HDL: 51 mg/dL (ref >39)
LDL CALC: 57 mg/dL (ref 0–99)
Triglycerides: 47 mg/dL (ref 0–149)
VLDL CHOLESTEROL CAL: 9 mg/dL (ref 5–40)

## 2018-01-18 LAB — PSA: PROSTATE SPECIFIC AG, SERUM: 1.2 ng/mL (ref 0.0–4.0)

## 2018-02-04 ENCOUNTER — Other Ambulatory Visit: Payer: Self-pay | Admitting: Cardiology

## 2018-02-05 ENCOUNTER — Other Ambulatory Visit: Payer: Self-pay | Admitting: Cardiology

## 2018-02-05 MED ORDER — DOFETILIDE 500 MCG PO CAPS: 500.0000 ug | ORAL_CAPSULE | Freq: Two times a day (BID) | ORAL | 0 refills | Status: DC

## 2018-02-05 MED ORDER — LISINOPRIL 20 MG PO TABS: 20.0000 mg | ORAL_TABLET | Freq: Every day | ORAL | 0 refills | Status: DC

## 2018-02-06 ENCOUNTER — Ambulatory Visit (INDEPENDENT_AMBULATORY_CARE_PROVIDER_SITE_OTHER): Payer: Medicare Other | Admitting: Family Medicine

## 2018-02-06 ENCOUNTER — Other Ambulatory Visit: Payer: Self-pay | Admitting: *Deleted

## 2018-02-06 ENCOUNTER — Encounter: Payer: Self-pay | Admitting: Family Medicine

## 2018-02-06 ENCOUNTER — Encounter: Payer: Medicare Other | Admitting: Family Medicine

## 2018-02-06 VITALS — BP 134/86 | Ht 66.5 in | Wt 150.6 lb

## 2018-02-06 DIAGNOSIS — J418 Mixed simple and mucopurulent chronic bronchitis: Secondary | ICD-10-CM

## 2018-02-06 DIAGNOSIS — I6523 Occlusion and stenosis of bilateral carotid arteries: Secondary | ICD-10-CM

## 2018-02-06 DIAGNOSIS — Z0000 Encounter for general adult medical examination without abnormal findings: Secondary | ICD-10-CM | POA: Diagnosis not present

## 2018-02-06 DIAGNOSIS — F5101 Primary insomnia: Secondary | ICD-10-CM | POA: Diagnosis not present

## 2018-02-06 DIAGNOSIS — I10 Essential (primary) hypertension: Secondary | ICD-10-CM

## 2018-02-06 DIAGNOSIS — E7800 Pure hypercholesterolemia, unspecified: Secondary | ICD-10-CM

## 2018-02-06 DIAGNOSIS — G4700 Insomnia, unspecified: Secondary | ICD-10-CM | POA: Insufficient documentation

## 2018-02-06 MED ORDER — ATORVASTATIN CALCIUM 40 MG PO TABS
40.0000 mg | ORAL_TABLET | Freq: Every day | ORAL | 1 refills | Status: DC
Start: 2018-02-06 — End: 2018-02-06

## 2018-02-06 MED ORDER — ALBUTEROL SULFATE HFA 108 (90 BASE) MCG/ACT IN AERS: 2.0000 | INHALATION_SPRAY | Freq: Four times a day (QID) | RESPIRATORY_TRACT | 2 refills | Status: DC | PRN

## 2018-02-06 MED ORDER — ATORVASTATIN CALCIUM 40 MG PO TABS: 40.0000 mg | ORAL_TABLET | Freq: Every day | ORAL | 1 refills | Status: DC

## 2018-02-06 MED ORDER — LISINOPRIL 20 MG PO TABS: 20.0000 mg | ORAL_TABLET | Freq: Every day | ORAL | 1 refills | Status: DC

## 2018-02-06 MED ORDER — POTASSIUM CHLORIDE ER 10 MEQ PO TBCR: 10.0000 meq | EXTENDED_RELEASE_TABLET | Freq: Every day | ORAL | 1 refills | Status: DC

## 2018-02-06 MED ORDER — ALPRAZOLAM 0.25 MG PO TABS
ORAL_TABLET | ORAL | 5 refills | Status: DC
Start: 2018-02-06 — End: 2018-08-26

## 2018-02-06 NOTE — Progress Notes (Signed)
Subjective:    Patient ID: [name], male    DOB: 1946-07-03, 72 y.o.   MRN: [id number]  HPI AWV- Annual Wellness Visit  The patient was seen for their annual wellness visit. The patient's past medical history, surgical history, and family history were reviewed. Pertinent vaccines were reviewed ( tetanus, pneumonia, shingles, flu) The patient's medication list was reviewed and updated.  The height and weight were entered.  BMI recorded in electronic record elsewhere  Cognitive screening was completed. Outcome of Mini - Cog: pass   Falls /depression screening electronically recorded within record elsewhere  Current tobacco usage: none (All patients who use tobacco were given written and verbal information on quitting)  Recent listing of emergency department/hospitalizations over the past year were reviewed.  current specialist the patient sees on a regular basis: [facility]   Medicare annual wellness visit patient questionnaire was reviewed.  A written screening schedule for the patient for the next 5-10 years was given. Appropriate discussion of followup regarding next visit was discussed.    Pt noted a knot came up in the left groin, got sore, mostly went back dosn. Popped out and caused some soreness  Blood pressure medicine and blood pressure levels reviewed today with patient. Compliant with blood pressure medicine. States does not miss a dose. No obvious side effects. Blood pressure generally good when checked elsewhere. Watching salt intake.  Results for orders placed or performed in visit on 95/09/32  Basic Metabolic Panel (BMET)  Result Value Ref Range   Glucose 92 65 - 99 mg/dL   BUN 23 8 - 27 mg/dL   Creatinine, Ser 1.15 0.76 - 1.27 mg/dL   GFR calc non Af Amer 62 >59 mL/min/1.73   GFR calc Af Amer 72 >59 mL/min/1.73   BUN/Creatinine Ratio 20 10 - 24   Sodium 145 (H) 134 - 144 mmol/L   Potassium 4.9 3.5 - 5.2 mmol/L   Chloride 107 (H) 96 - 106  mmol/L   CO2 23 20 - 29 mmol/L   Calcium 8.9 8.6 - 10.2 mg/dL  PSA  Result Value Ref Range   Prostate Specific Ag, Serum 1.2 0.0 - 4.0 ng/mL  Hepatic function panel  Result Value Ref Range   Total Protein 6.7 6.0 - 8.5 g/dL   Albumin 4.1 3.5 - 4.8 g/dL   Bilirubin Total 0.5 0.0 - 1.2 mg/dL   Bilirubin, Direct 0.16 0.00 - 0.40 mg/dL   Alkaline Phosphatase 81 39 - 117 IU/L   AST 22 0 - 40 IU/L   ALT 16 0 - 44 IU/L  Lipid Profile  Result Value Ref Range   Cholesterol, Total 117 100 - 199 mg/dL   Triglycerides 47 0 - 149 mg/dL   HDL 51 >39 mg/dL   VLDL Cholesterol Cal 9 5 - 40 mg/dL   LDL Calculated 57 0 - 99 mg/dL   Chol/HDL Ratio 2.3 0.0 - 5.0 ratio    Patient continues to take lipid medication regularly. No obvious side effects from it. Generally does not miss a dose. Prior blood work results are reviewed with patient. Patient continues to work on fat intake in diet  Patient compliant with insomnia medication. Generally takes most nights. No obvious morning drowsiness. Definitely helps patient sleep. Without it patient states would not get a good nights rest.  Rarely uses inhaler, hx of copd, no recetn flares  Review of Systems  Constitutional: Negative for activity change, appetite change and fever.  HENT: Negative for  congestion and rhinorrhea.   Eyes: Negative for discharge.  Respiratory: Negative for cough and wheezing.   Cardiovascular: Negative for chest pain.  Gastrointestinal: Negative for abdominal pain, blood in stool and vomiting.  Genitourinary: Negative for difficulty urinating and frequency.  Musculoskeletal: Negative for neck pain.  Skin: Negative for rash.  Allergic/Immunologic: Negative for environmental allergies and food allergies.  Neurological: Negative for weakness and headaches.  Psychiatric/Behavioral: Negative for agitation.  All other systems reviewed and are negative.      Objective:   Physical Exam  Constitutional: He appears well-developed  and well-nourished.  HENT:  Head: Normocephalic and atraumatic.  Right Ear: External ear normal.  Left Ear: External ear normal.  Nose: Nose normal.  Mouth/Throat: Oropharynx is clear and moist.  Eyes: Pupils are equal, round, and reactive to light. EOM are normal.  Neck: Normal range of motion. Neck supple. No thyromegaly present.  Cardiovascular: Normal rate, regular rhythm and normal heart sounds.  No murmur heard. Pulmonary/Chest: Effort normal and breath sounds normal. No respiratory distress. He has no wheezes.  Abdominal: Soft. Bowel sounds are normal. He exhibits no distension and no mass. There is no tenderness.  Genitourinary: Prostate normal and penis normal.  Musculoskeletal: Normal range of motion. He exhibits no edema.  Lymphadenopathy:    He has no cervical adenopathy.  Neurological: He is alert. He exhibits normal muscle tone.  Skin: Skin is warm and dry. No erythema.  Psychiatric: He has a normal mood and affect. His behavior is normal. Judgment normal.          Assessment & Plan:  Last colon done last yr 2018  1.  Wellness.  Colonoscopy discussed and update.  Diet discussed.  Exercise discussed regarding and his pneumonia shots and flu shot  2.  Hypertension.  Control.  Discussed to maintain same meds  3.  Hyperlipidemia.  LDL excellent control.  With coronary artery disease discussed  4.  Atrial flutter followed by the specialist on blood thinning medication  5.  Insomnia.  Ongoing with ongoing need for medications side effects benefits discussed  Diet exercise discussed medications refilled follow-up in 6 months.  Blood work reviewed.

## 2018-02-15 ENCOUNTER — Ambulatory Visit (INDEPENDENT_AMBULATORY_CARE_PROVIDER_SITE_OTHER): Payer: Medicare Other | Admitting: Cardiology

## 2018-02-15 ENCOUNTER — Encounter: Payer: Self-pay | Admitting: Cardiology

## 2018-02-15 VITALS — BP 136/70 | HR 53 | Ht 66.0 in | Wt 150.1 lb

## 2018-02-15 DIAGNOSIS — I480 Paroxysmal atrial fibrillation: Secondary | ICD-10-CM

## 2018-02-15 DIAGNOSIS — E7800 Pure hypercholesterolemia, unspecified: Secondary | ICD-10-CM

## 2018-02-15 DIAGNOSIS — I6523 Occlusion and stenosis of bilateral carotid arteries: Secondary | ICD-10-CM | POA: Diagnosis not present

## 2018-02-15 DIAGNOSIS — Z7901 Long term (current) use of anticoagulants: Secondary | ICD-10-CM

## 2018-02-15 NOTE — Patient Instructions (Signed)
Medication Instructions:  The current medical regimen is effective;  continue present plan and medications.  Follow-Up: Follow up with [name] as instructed.  If you need a refill on your cardiac medications before your next appointment, please call your pharmacy.  Thank you for choosing [facility]!!

## 2018-02-15 NOTE — Progress Notes (Signed)
[facility]. [address] [place], [place]  [address] Phone: [phone] Fax:  [phone]  Date:  02/15/2018   ID:  [name], [name] Jan 29, 1946, MRN [id number]  PCP:  Mikey Kirschner, MD   History of Present Illness: [name] is a 72 y.o. male with paroxysmal atrial fibrillation that underwent Tikosyn loading on 01/10/16, followed by [name]. Has been taking both Xarelto and Tikosyn.  He works as an Clinical biochemist.   He has been feeling really good since being on Tikosyn and restoration of sinus rhythm has taken place. His ejection fraction has improved. Previously 30-35%, now 45-50%. No longer on diuretic.  Prior TEE cardioversion. Has CAD status post CABG in 2008.  COPD, tobacco use as well as carotid artery disease. Hyperlipidemia treated with pravastatin.   Original TEE cardioversion was canceled because a possible LA thrombus. Repeat looked okay. Successful cardioversion.  Chest x-ray showed COPD.  Stress test February of 2012 was low risk with no ischemia. Reassuring. This was prior to rotator cuff surgery.  Carotid artery duplex -Stable RICA 71%, LICA now 06-26% mildly progressed.   01/26/17-overall doing quite well, no syncope. He did have one episode of dizziness when driving, lasting about 10 seconds see states. He pulled over immediately. He has not had any since. No chest pain, no significant shortness of breath. He is still helping out with electrician work  02/15/2018-overall still doing quite well.  Feels good in sinus rhythm.  No syncope bleeding orthopnea PND.  I cautioned him about getting up on ladders.   Wt Readings from Last 3 Encounters:  02/15/18 150 lb 1.9 oz (68.1 kg)  02/06/18 150 lb 9.6 oz (68.3 kg)  11/16/17 154 lb 0.2 oz (69.9 kg)     Past Medical History:  Diagnosis Date  . Atrial fibrillation ([place])   . Bilateral carotid artery disease ([place]) 07/21/2013  . CAD (coronary artery disease)   . COPD (chronic obstructive pulmonary  disease) ([place])   . Gastritis   . Headache(784.0)   . Hypertension   . Visit for monitoring Tikosyn therapy 12/2015    Past Surgical History:  Procedure Laterality Date  . CARDIOVERSION N/A 09/08/2015   Procedure: CARDIOVERSION;  Surgeon: Thayer Headings, MD;  Location: [facility];  Service: Cardiovascular;  Laterality: N/A;  . CORONARY ARTERY BYPASS GRAFT    . INCISION AND DRAINAGE DEEP NECK ABSCESS    . ROTATOR CUFF REPAIR Bilateral   . TEE WITHOUT CARDIOVERSION N/A 08/02/2015   Procedure: TRANSESOPHAGEAL ECHOCARDIOGRAM (TEE);  Surgeon: Sueanne Margarita, MD;  Location: [facility] ENDOSCOPY;  Service: Cardiovascular;  Laterality: N/A;  . TEE WITHOUT CARDIOVERSION N/A 09/08/2015   Procedure: TRANSESOPHAGEAL ECHOCARDIOGRAM (TEE);  Surgeon: Thayer Headings, MD;  Location: [facility] ENDOSCOPY;  Service: Cardiovascular;  Laterality: N/A;    Current Outpatient Medications  Medication Sig Dispense Refill  . albuterol (PROVENTIL HFA;VENTOLIN HFA) 108 (90 Base) MCG/ACT inhaler Inhale 2 puffs into the lungs every 6 (six) hours as needed for wheezing or shortness of breath. 1 Inhaler 2  . ALPRAZolam (XANAX) 0.25 MG tablet TAKE 1/2 TO 1 (ONE-HALF TO ONE) TABLET BY MOUTH TWICE DAILY AS NEEDED 30 tablet 5  . atorvastatin (LIPITOR) 40 MG tablet Take 1 tablet (40 mg total) by mouth daily. 90 tablet 1  . dofetilide (TIKOSYN) 500 MCG capsule Take 1 capsule (500 mcg total) by mouth 2 (two) times daily. Please keep upcoming appt with Dr. Marlou Porch. Thank you 180 capsule 0  .  lisinopril (PRINIVIL,ZESTRIL) 20 MG tablet Take 1 tablet (20 mg total) by mouth daily. Please keep upcoming appt with Dr. Marlou Porch for future refills. Thank you 90 tablet 1  . Multiple Vitamins-Minerals (MULTIVITAMIN WITH MINERALS) tablet Take 1 tablet by mouth at bedtime.     . Omega-3 Fatty Acids (FISH OIL) 1200 MG CAPS Take 1,200 mg by mouth at bedtime.     . potassium chloride (K-DUR) 10 MEQ tablet Take 1 tablet (10 mEq total) by mouth daily. 90 tablet 1    . vitamin C (ASCORBIC ACID) 500 MG tablet Take 500 mg by mouth at bedtime.     [name] 20 MG TABS tablet TAKE ONE TABLET BY MOUTH ONCE DAILY WITH  SUPPER 30 tablet 10   No current facility-administered medications for this visit.     Allergies:    Allergies  Allergen Reactions  . Doxycycline Shortness Of Breath    Also has to urinate more often  . Nsaids Hypertension    Gastritis     Social History:  The patient  reports that he quit smoking about 11 years ago. He has never used smokeless tobacco. He reports that he does not drink alcohol or use drugs.   ROS:  Please see the history of present illness.   Denies syncope, bleeding, orthopnea, PND he does have easy bruising however. PHYSICAL EXAM: VS:  BP 136/70   Pulse (!) 53   Ht 5\' 6"  (1.676 m)   Wt 150 lb 1.9 oz (68.1 kg)   SpO2 96%   BMI 24.23 kg/m  GEN: Well nourished, well developed, in no acute distress  HEENT: normal  Neck: no JVD, carotid bruits, or masses Cardiac: RRR; no murmurs, rubs, or gallops,no edema , CABG Respiratory:  clear to auscultation bilaterally, normal work of breathing GI: soft, nontender, nondistended, + BS MS: no deformity or atrophy  Skin: warm and dry, no rash, bruising Neuro:  Alert and Oriented x 3, Strength and sensation are intact Psych: euthymic mood, full affect    EKG: 09/10/15-sinus bradycardia with premature supraventricular complexes, PA-C, 57, T-wave inversion inferiorly as well as precordial leads lateral. Nonspecific. Personally viewed-prior 07/24/14-sinus rhythm, 62, nonspecific ST-T wave changes, ounce living ST segment consider inferior/anterolateral ischemia.prior NSR, 62, NSSTW changes.     TEE 09/08/15:  - Ejection fraction 35-40%  - No obvious thrombus  Echo 12/29/15: - Left ventricle: The cavity size was normal. Wall thickness was   normal. Systolic function was mildly reduced. The estimated   ejection fraction was in the range of 45% to 50%. Abnormal GLPSS   at -13%  with inferoseptal strain abnormality. The study is not   technically sufficient to allow evaluation of LV diastolic   function. - Mitral valve: Mildly thickened leaflets . There was trivial   regurgitation. - Left atrium: Severely dilated at 51 ml/m2. - Right ventricle: The cavity size was mildly dilated. - Right atrium: The atrium was mildly dilated. - Tricuspid valve: There was trivial regurgitation. - Pulmonary arteries: PA peak pressure: 21 mm Hg (S). - Inferior vena cava: The vessel was normal in size. The   respirophasic diameter changes were in the normal range (>= 50%),   consistent with normal central venous pressure.  Impressions:  - Compared to the most recent echo in 08/2015, the LVEF is slightly   improved at 45-50% wtih inferoseptal wall motion and strain   abnormalities.  Stress test February of 2012 was low risk with no ischemia. Reassuring. This was prior to  rotator cuff surgery.  Carotid artery duplex 2018: Heterogeneous plaque, bilaterally.  Stable 60-79% RICA stenosis.  Stable 58-09% LICA stenosis.  Essentially stable >50% stenosis in the bilateral ECA's.  Normal subclavian arteries, bilaterally.  Patent vertebral arteries with antegrade flow.  F/U 1 year    ASSESSMENT AND PLAN:  Atrial flutter- 2:1 /prior atrial fibrillation paroxysmal - NOAC, Xarelto 20 mg once a day, no change  - Successful TEE cardioversion on 09/08/15  - EF 35-40%-improved to 50% on echocardiogram in May 2017 after resolution of atrial fibrillation.  - Tikosyn 500 mg twice a day to maintain rhythm. Working well. Appreciate Dr. Rayann Heman, [name] in atrial fibrillation clinic.  He will be following up for Tikosyn visit.  COPD  - Noted on chest x-ray  - Smoking history, overall doing well.  CAD post bypass  - 2008  - Nuclear stress test 2012 reassuring, no changes.  Prior cardiomyopathy  - EF is improved after restoration of sinus rhythm  - No need for Lasix.    Overall feeling much better.   Carotid artery disease  -stable. Continuing to monitor every 12 months. Asymptomatic, Statin. 79%.  No symptoms  Chronic anticoagulation- CHADS-VASc 3, other than mild bruising, no bleeding  Hypertension-currently reasonable controlled. Continue to monitor closely.   Hyperlipidemia-continue with atorvastatin. LDL <70.  Doing well, no myalgias  Follow-up in 12 months with me. He is seeing [name], NP in atrial fibrillation clinic for Tikosyn follow-up.  It would be nice to have staggered visits.  Signed, Candee Furbish, MD [facility]  02/15/2018 10:04 AM

## 2018-03-21 ENCOUNTER — Other Ambulatory Visit: Payer: Self-pay | Admitting: Cardiology

## 2018-03-21 DIAGNOSIS — I10 Essential (primary) hypertension: Secondary | ICD-10-CM

## 2018-03-21 DIAGNOSIS — I4892 Unspecified atrial flutter: Secondary | ICD-10-CM

## 2018-05-04 ENCOUNTER — Other Ambulatory Visit: Payer: Self-pay | Admitting: Cardiology

## 2018-05-16 ENCOUNTER — Ambulatory Visit (HOSPITAL_COMMUNITY)
Admission: RE | Admit: 2018-05-16 | Discharge: 2018-05-16 | Disposition: A | Discharge status: Home / Self Care | Payer: Medicare Other | Source: Ambulatory Visit | Attending: Nurse Practitioner | Admitting: Nurse Practitioner

## 2018-05-16 ENCOUNTER — Encounter (HOSPITAL_COMMUNITY): Payer: Self-pay | Admitting: Nurse Practitioner

## 2018-05-16 VITALS — BP 158/84 | HR 61 | Ht 66.0 in | Wt 146.6 lb

## 2018-05-16 DIAGNOSIS — Z886 Allergy status to analgesic agent status: Secondary | ICD-10-CM | POA: Diagnosis not present

## 2018-05-16 DIAGNOSIS — R9431 Abnormal electrocardiogram [ECG] [EKG]: Secondary | ICD-10-CM | POA: Insufficient documentation

## 2018-05-16 DIAGNOSIS — J449 Chronic obstructive pulmonary disease, unspecified: Secondary | ICD-10-CM | POA: Insufficient documentation

## 2018-05-16 DIAGNOSIS — Z881 Allergy status to other antibiotic agents status: Secondary | ICD-10-CM | POA: Diagnosis not present

## 2018-05-16 DIAGNOSIS — I2510 Atherosclerotic heart disease of native coronary artery without angina pectoris: Secondary | ICD-10-CM | POA: Insufficient documentation

## 2018-05-16 DIAGNOSIS — I10 Essential (primary) hypertension: Secondary | ICD-10-CM | POA: Diagnosis not present

## 2018-05-16 DIAGNOSIS — I480 Paroxysmal atrial fibrillation: Secondary | ICD-10-CM | POA: Insufficient documentation

## 2018-05-16 DIAGNOSIS — Z7901 Long term (current) use of anticoagulants: Secondary | ICD-10-CM | POA: Diagnosis not present

## 2018-05-16 DIAGNOSIS — Z87891 Personal history of nicotine dependence: Secondary | ICD-10-CM | POA: Insufficient documentation

## 2018-05-16 DIAGNOSIS — Z8249 Family history of ischemic heart disease and other diseases of the circulatory system: Secondary | ICD-10-CM | POA: Insufficient documentation

## 2018-05-16 DIAGNOSIS — Z79899 Other long term (current) drug therapy: Secondary | ICD-10-CM | POA: Diagnosis not present

## 2018-05-16 DIAGNOSIS — I4891 Unspecified atrial fibrillation: Secondary | ICD-10-CM | POA: Diagnosis present

## 2018-05-16 LAB — BASIC METABOLIC PANEL
ANION GAP: 7 (ref 5–15)
BUN: 17 mg/dL (ref 8–23)
CO2: 27 mmol/L (ref 22–32)
Calcium: 9.2 mg/dL (ref 8.9–10.3)
Chloride: 106 mmol/L (ref 98–111)
Creatinine, Ser: 1.24 mg/dL (ref 0.61–1.24)
GFR calc Af Amer: >60 mL/min (ref >60)
GFR, EST NON AFRICAN AMERICAN: 56 mL/min — AB (ref >60)
GLUCOSE: 97 mg/dL (ref 70–99)
POTASSIUM: 4.2 mmol/L (ref 3.5–5.1)
Sodium: 140 mmol/L (ref 135–145)

## 2018-05-16 LAB — MAGNESIUM: MAGNESIUM: 2.6 mg/dL — AB (ref 1.7–2.4)

## 2018-05-16 NOTE — Progress Notes (Signed)
Patient ID: [name], male   DOB: December 14, 1945, 72 y.o.   MRN: [id number]     Primary Care Physician: Mikey Kirschner, MD Referring Physician:Dr. Denali Becvar is a 72 y.o. male with a h/o PAF that underwent tikosyn loading at [facility] 01/10/16 for f/u. He reports that he is doing well and has not had any further issues with afib. He is being compliant with tikosyn as well as xarelto. 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 BP rechecked 158/86.  F/u in afib clinic 10/17/17, pt is still enjoying NSR with Tikosyn. 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 He is not having any issues with xarelto.  No problems to discuss today.  F/u afib clinic, 9/19 for f/u Tikosyn. He feels well, no active afib. He is still working full time as an Clinical biochemist. No bleeding issues.    Today, he denies symptoms of palpitations, chest pain, shortness of breath, orthopnea, PND, lower extremity edema, dizziness, presyncope, syncope, or neurologic sequela. The patient is tolerating medications without difficulties and is otherwise without complaint today.   Past Medical History:  Diagnosis Date  . Atrial fibrillation ([place])   . Bilateral carotid artery disease ([place]) 07/21/2013  . CAD (coronary artery disease)   . COPD (chronic obstructive pulmonary disease) ([place])   . Gastritis   . Headache(784.0)   . Hypertension   . Visit for monitoring Tikosyn therapy 12/2015   Past Surgical History:  Procedure Laterality Date  . CARDIOVERSION N/A 09/08/2015   Procedure: CARDIOVERSION;  Surgeon: Thayer Headings, MD;  Location: [place];  Service: Cardiovascular;  Laterality: N/A;  . CORONARY ARTERY BYPASS GRAFT    . INCISION AND DRAINAGE DEEP NECK ABSCESS    . ROTATOR CUFF REPAIR Bilateral   . TEE WITHOUT  CARDIOVERSION N/A 08/02/2015   Procedure: TRANSESOPHAGEAL ECHOCARDIOGRAM (TEE);  Surgeon: Sueanne Margarita, MD;  Location: [facility] ENDOSCOPY;  Service: Cardiovascular;  Laterality: N/A;  . TEE WITHOUT CARDIOVERSION N/A 09/08/2015   Procedure: TRANSESOPHAGEAL ECHOCARDIOGRAM (TEE);  Surgeon: Thayer Headings, MD;  Location: [facility] ENDOSCOPY;  Service: Cardiovascular;  Laterality: N/A;    Current Outpatient Medications  Medication Sig Dispense Refill  . ALPRAZolam (XANAX) 0.25 MG tablet TAKE 1/2 TO 1 (ONE-HALF TO ONE) TABLET BY MOUTH TWICE DAILY AS NEEDED 30 tablet 5  . atorvastatin (LIPITOR) 40 MG tablet Take 1 tablet (40 mg total) by mouth daily. 90 tablet 1  . dofetilide (TIKOSYN) 500 MCG capsule TAKE 1 CAPSULE BY MOUTH TWICE DAILY 180 capsule 2  . lisinopril (PRINIVIL,ZESTRIL) 20 MG tablet Take 1 tablet (20 mg total) by mouth daily. Please keep upcoming appt with Dr. Marlou Porch for future refills. Thank you 90 tablet 1  . Multiple Vitamins-Minerals (MULTIVITAMIN WITH MINERALS) tablet Take 1 tablet by mouth at bedtime.     . Omega-3 Fatty Acids (FISH OIL) 1200 MG CAPS Take 1,200 mg by mouth at bedtime.     . potassium chloride (K-DUR) 10 MEQ tablet Take 1 tablet (10 mEq total) by mouth daily. 90 tablet 1  . vitamin C (ASCORBIC ACID) 500 MG tablet Take 500 mg by mouth at bedtime.     [name] 20 MG TABS tablet TAKE 1 TABLET BY MOUTH ONCE DAILY WITH  SUPPER 90 tablet 1  .  albuterol (PROVENTIL HFA;VENTOLIN HFA) 108 (90 Base) MCG/ACT inhaler Inhale 2 puffs into the lungs every 6 (six) hours as needed for wheezing or shortness of breath. (Patient not taking: Reported on 05/16/2018) 1 Inhaler 2   No current facility-administered medications for this encounter.     Allergies  Allergen Reactions  . Doxycycline Shortness Of Breath    Also has to urinate more often  . Nsaids Hypertension    Gastritis     Social History   Socioeconomic History  . Marital status: Married    Spouse name: Not on file  . Number of  children: Not on file  . Years of education: Not on file  . Highest education level: Not on file  Occupational History  . Not on file  Social Needs  . Financial resource strain: Not on file  . Food insecurity:    Worry: Not on file    Inability: Not on file  . Transportation needs:    Medical: Not on file    Non-medical: Not on file  Tobacco Use  . Smoking status: Former Smoker    Last attempt to quit: 08/28/2006    Years since quitting: 11.7  . Smokeless tobacco: Never Used  Substance and Sexual Activity  . Alcohol use: No    Comment: quit drinking in the 70's  . Drug use: No  . Sexual activity: Not on file  Lifestyle  . Physical activity:    Days per week: Not on file    Minutes per session: Not on file  . Stress: Not on file  Relationships  . Social connections:    Talks on phone: Not on file    Gets together: Not on file    Attends religious service: Not on file    Active member of club or organization: Not on file    Attends meetings of clubs or organizations: Not on file    Relationship status: Not on file  . Intimate partner violence:    Fear of current or ex partner: Not on file    Emotionally abused: Not on file    Physically abused: Not on file    Forced sexual activity: Not on file  Other Topics Concern  . Not on file  Social History Narrative  . Not on file    Family History  Problem Relation Age of Onset  . Diabetes Mother   . Hypertension Mother   . Heart attack Father   . Sudden death Father   . Hypertension Sister   . Diabetes Sister     ROS- All systems are reviewed and negative except as per the HPI above  Physical Exam: Vitals:   05/16/18 0936  BP: (!) 158/84  Pulse: 61  Weight: 66.5 kg  Height: 5\' 6"  (1.676 m)    GEN- The patient is well appearing, alert and oriented x 3 today.   Head- normocephalic, atraumatic Eyes-  Sclera clear, conjunctiva pink Ears- hearing intact Oropharynx- clear Neck- supple, no JVP Lymph- no cervical  lymphadenopathy Lungs- Clear to ausculation bilaterally, normal work of breathing Heart- Regular rate and rhythm, no murmurs, rubs or gallops, PMI not laterally displaced GI- soft, NT, ND, + BS Extremities- no clubbing, cyanosis, or edema MS- no significant deformity or atrophy Skin- no rash or lesion Psych- euthymic mood, full affect Neuro- strength and sensation are intact  EKG-Sinus Brady at 54 bpm, pr int 194 ms, qrs int 96 ms, qtc 460 ms Epic records reviewed  Assessment and Plan: 1.  Paroxysmal atrial fibrillation Maintaining SR on tikosyn 500 mg bid Continue xarelto 20 mg a day for chadsvasc score of at least 3 Bmet/mag today  2. H/o Carotid artery disease  Per Dr. Marlou Porch  3. HTN stable   Dr. Marlou Porch as scheduled  and I will see in 6 months for surveillance of [place]. Jalesa Thien, [facility] [address] [place], [place] [address] [phone]

## 2018-05-23 ENCOUNTER — Encounter (HOSPITAL_COMMUNITY): Payer: Self-pay | Admitting: *Deleted

## 2018-06-04 ENCOUNTER — Ambulatory Visit (INDEPENDENT_AMBULATORY_CARE_PROVIDER_SITE_OTHER): Payer: Medicare Other | Admitting: Family Medicine

## 2018-06-04 ENCOUNTER — Encounter: Payer: Self-pay | Admitting: Family Medicine

## 2018-06-04 VITALS — BP 136/86 | Temp 97.5°F | Ht 66.0 in | Wt 146.6 lb

## 2018-06-04 DIAGNOSIS — I6523 Occlusion and stenosis of bilateral carotid arteries: Secondary | ICD-10-CM | POA: Diagnosis not present

## 2018-06-04 DIAGNOSIS — J441 Chronic obstructive pulmonary disease with (acute) exacerbation: Secondary | ICD-10-CM

## 2018-06-04 DIAGNOSIS — R21 Rash and other nonspecific skin eruption: Secondary | ICD-10-CM

## 2018-06-04 DIAGNOSIS — I10 Essential (primary) hypertension: Secondary | ICD-10-CM

## 2018-06-04 MED ORDER — AMOXICILLIN 500 MG PO CAPS: 500.0000 mg | ORAL_CAPSULE | Freq: Three times a day (TID) | ORAL | 0 refills | Status: DC

## 2018-06-04 NOTE — Progress Notes (Signed)
   Subjective:    Patient ID: [name], male    DOB: 1946-03-12, 72 y.o.   MRN: [id number] Patient presents supposedly for one problem is fairly quickly turned into 3 Cough  This is a new problem. The current episode started in the past 7 days. Associated symptoms include nasal congestion. He has tried nothing for the symptoms.   Cough up phlegm off and on, productive at times.  Headache.  Frontal in nature.  Positive nasal discharge intermittent congestion no fever  Also brings in blood work.  Concerned about it.  Showed mildly elevated magnesium mildly low GFR was advised to bring this to his primary care for follow-up.  Patient has a spot on abdomen.  Started as a pimple/now with residual nodular region crusty core surface patient wonders if something serious  Review of Systems  Respiratory: Positive for cough.        Objective:   Physical Exam  Alert, mild malaise. Hydration good Vitals stable. frontal/ maxillary tenderness evident positive nasal congestion. pharynx normal neck supple  lungs clear/no crackles or wheezes. heart regular in rhythm  Skin reveals nodular palpable region at site abdominal infection with oxidation cord at the skin level     Assessment & Plan:  Impression rhinosinusitis likely post viral, discussed with patient. plan antibiotics prescribed. Questions answered. Symptomatic care discussed. warning signs discussed. WSL   2.  Skin lesion very highly likely benign discussed.  At first patient wanted out now he which I agree  3.  Mild aberrations of blood work will follow-up specialist request  Greater than 50% of this 25 minute face to face visit was spent in counseling and discussion and coordination of care regarding the above diagnosis/diagnosies

## 2018-06-05 LAB — BASIC METABOLIC PANEL
BUN/Creatinine Ratio: 11 (ref 10–24)
BUN: 13 mg/dL (ref 8–27)
CALCIUM: 9.4 mg/dL (ref 8.6–10.2)
CHLORIDE: 107 mmol/L — AB (ref 96–106)
CO2: 25 mmol/L (ref 20–29)
Creatinine, Ser: 1.14 mg/dL (ref 0.76–1.27)
GFR calc Af Amer: 73 mL/min/{1.73_m2} (ref >59)
GFR, EST NON AFRICAN AMERICAN: 63 mL/min/{1.73_m2} (ref >59)
GLUCOSE: 95 mg/dL (ref 65–99)
POTASSIUM: 4.6 mmol/L (ref 3.5–5.2)
Sodium: 146 mmol/L — ABNORMAL HIGH (ref 134–144)

## 2018-06-05 LAB — MAGNESIUM: MAGNESIUM: 2.3 mg/dL (ref 1.6–2.3)

## 2018-06-26 DIAGNOSIS — Z23 Encounter for immunization: Secondary | ICD-10-CM | POA: Diagnosis not present

## 2018-08-06 ENCOUNTER — Ambulatory Visit (HOSPITAL_COMMUNITY)
Admission: RE | Admit: 2018-08-06 | Discharge: 2018-08-06 | Disposition: A | Discharge status: Home / Self Care | Payer: Medicare Other | Source: Ambulatory Visit | Attending: Cardiovascular Disease | Admitting: Cardiovascular Disease

## 2018-08-06 DIAGNOSIS — I6523 Occlusion and stenosis of bilateral carotid arteries: Secondary | ICD-10-CM | POA: Insufficient documentation

## 2018-08-12 ENCOUNTER — Telehealth: Payer: Self-pay

## 2018-08-12 DIAGNOSIS — I739 Peripheral vascular disease, unspecified: Principal | ICD-10-CM

## 2018-08-12 DIAGNOSIS — I779 Disorder of arteries and arterioles, unspecified: Secondary | ICD-10-CM

## 2018-08-12 NOTE — Telephone Encounter (Signed)
-----   Message from Jerline Pain, MD sent at 08/09/2018  4:16 PM EST ----- Right carotid - 40-59%.  Left carotid - 1-39%  12 months follow up, Repeat.  Candee Furbish, MD

## 2018-08-12 NOTE — Telephone Encounter (Signed)
Notes recorded by Frederik Schmidt, RN on 08/12/2018 at 9:22 AM EST The patient's wife has been notified of the result and verbalized understanding. Ordered Carotid US for 1 year f/u. All questions (if any) were answered. Frederik Schmidt, RN 08/12/2018 9:21 AM   ------

## 2018-08-26 ENCOUNTER — Other Ambulatory Visit: Payer: Self-pay | Admitting: Family Medicine

## 2018-08-26 NOTE — Telephone Encounter (Signed)
Ok six ref 

## 2018-09-10 ENCOUNTER — Other Ambulatory Visit: Payer: Self-pay | Admitting: Surgery

## 2018-09-10 DIAGNOSIS — S31109A Unspecified open wound of abdominal wall, unspecified quadrant without penetration into peritoneal cavity, initial encounter: Secondary | ICD-10-CM | POA: Diagnosis not present

## 2018-09-16 ENCOUNTER — Telehealth: Payer: Self-pay

## 2018-09-16 NOTE — Telephone Encounter (Signed)
   [facility] Pre-operative Risk Assessment    Request for surgical clearance:  1. What type of surgery is being performed? Excision of chronic abdominal wall wound   2. When is this surgery scheduled? TBD   3. What type of clearance is required (medical clearance vs. Pharmacy clearance to hold med vs. Both)? Pharmacy  4. Are there any medications that need to be held prior to surgery and how long? Xarelto   5. Practice name and name of physician performing surgery? [facility]   6. What is your office phone number [phone]    7.   What is your office fax number [phone]  8.   Anesthesia type (None, local, MAC, general) ? General   Mady Haagensen 09/16/2018, 12:43 PM  _________________________________________________________________   (provider comments below)

## 2018-09-17 ENCOUNTER — Other Ambulatory Visit: Payer: Self-pay | Admitting: Cardiology

## 2018-09-17 ENCOUNTER — Other Ambulatory Visit: Payer: Self-pay | Admitting: Family Medicine

## 2018-09-17 DIAGNOSIS — I10 Essential (primary) hypertension: Secondary | ICD-10-CM

## 2018-09-17 DIAGNOSIS — I4892 Unspecified atrial flutter: Secondary | ICD-10-CM

## 2018-09-18 NOTE — Telephone Encounter (Signed)
Patient with diagnosis of Afib on Xarelto for anticoagulation.    Procedure: excision of chronic abd. Wall wound Date of procedure: TBD  CHADS2-VASc score of  3 (CHF, HTN, AGE, DM2, stroke/tia x 2, CAD, AGE, male)  CrCl 17mL/min  Per office protocol, patient can hold Xarelto for 1-2 days prior to procedure.

## 2018-09-18 NOTE — Telephone Encounter (Signed)
   Primary Cardiologist: Candee Furbish, MD  Chart reviewed as part of pre-operative protocol coverage.   Per pharmacy recommendations, patient can hold xarelto 1-2 days prior to his upcoming procedure. Xarelto should be resumed once cleared to do so by Dr. Ninfa Linden.   I will route this recommendation to the requesting party via Epic fax function and remove from pre-op pool.  Please call with questions.  Abigail Butts, PA-C 09/18/2018, 4:28 PM

## 2018-10-02 ENCOUNTER — Other Ambulatory Visit: Payer: Self-pay

## 2018-10-02 ENCOUNTER — Encounter (HOSPITAL_BASED_OUTPATIENT_CLINIC_OR_DEPARTMENT_OTHER): Payer: Self-pay | Admitting: *Deleted

## 2018-10-07 NOTE — H&P (Signed)
[name] Documented: 09/10/2018 10:09 AM Location: [facility] Patient #: [id number] DOB: 09/10/1945 Married / Language: Cleophus Molt / Race: White Male   History of Present Illness (Isabellah Sobocinski A. Ninfa Linden MD; 09/10/2018 10:21 AM) The patient is a 73 year old male who presents for wound check. This is a 73 year old gentleman referred by Dr. Baltazar Apo for evaluation of a chronic abdominal wall wound. The patient had emergency surgery back in the early 70s after trauma. He had a colostomy. He then had colostomy reversal followed by hernia repair. About a year ago, he noticed a firm area on the abdominal wall became erythematous and then came to and had an drained purulence. Since then, the wound will intermittently heal completely and then return. He has focal discomfort at the site. He is otherwise without complaints.   Past Surgical History (Tanisha A. Owens Shark, [place]; 09/10/2018 10:09 AM) Bypass Surgery for Poor Blood Flow to Legs  Colon Polyp Removal - Open  Coronary Artery Bypass Graft   Diagnostic Studies History (Tanisha A. Owens Shark, [place]; 09/10/2018 10:09 AM) Colonoscopy  1-5 years ago  Allergies (Tanisha A. Owens Shark, [place]; 09/10/2018 10:10 AM) Doxycycline *DERMATOLOGICALS*  Allergies Reconciled   Medication History (Tanisha A. Owens Shark, RMA; 09/10/2018 10:11 AM) Atorvastatin Calcium (40MG  Tablet, Oral) Active. Dofetilide (500MCG Capsule, Oral) Active. Lisinopril (20MG  Tablet, Oral) Active. Xarelto (20MG  Tablet, Oral) Active. Potassium Chloride ER (10MEQ Tablet ER, Oral) Active. ALPRAZolam (0.25MG  Tablet, Oral) Active. Fish Oil (875MG  Capsule, Oral) Active. Multi-Vitamin (Oral) Active. Vitamin C (500MG  Tablet, Oral) Active. Medications Reconciled  Social History (Tanisha A. Owens Shark, [place]; 09/10/2018 10:09 AM) Alcohol use  Remotely quit alcohol use. Caffeine use  Coffee. No drug use  Tobacco use  Former smoker.  Other Problems (Tanisha A. Owens Shark, [place];  09/10/2018 10:09 AM) Atrial Fibrillation  High blood pressure     Review of Systems (Tanisha A. Brown RMA; 09/10/2018 10:09 AM) General Not Present- Appetite Loss, Chills, Fatigue, Fever, Night Sweats, Weight Gain and Weight Loss. Skin Present- New Lesions. Not Present- Change in Wart/Mole, Dryness, Hives, Jaundice, Non-Healing Wounds, Rash and Ulcer. HEENT Not Present- Earache, Hearing Loss, Hoarseness, Nose Bleed, Oral Ulcers, Ringing in the Ears, Seasonal Allergies, Sinus Pain, Sore Throat, Visual Disturbances, Wears glasses/contact lenses and Yellow Eyes. Respiratory Not Present- Bloody sputum, Chronic Cough, Difficulty Breathing, Snoring and Wheezing. Breast Not Present- Breast Mass, Breast Pain, Nipple Discharge and Skin Changes. Gastrointestinal Not Present- Abdominal Pain, Bloating, Bloody Stool, Change in Bowel Habits, Chronic diarrhea, Constipation, Difficulty Swallowing, Excessive gas, Gets full quickly at meals, Hemorrhoids, Indigestion, Nausea, Rectal Pain and Vomiting. Male Genitourinary Not Present- Blood in Urine, Change in Urinary Stream, Frequency, Impotence, Nocturia, Painful Urination, Urgency and Urine Leakage. Neurological Not Present- Decreased Memory, Fainting, Headaches, Numbness, Seizures, Tingling, Tremor, Trouble walking and Weakness. Psychiatric Not Present- Anxiety, Bipolar, Change in Sleep Pattern, Depression, Fearful and Frequent crying. Endocrine Not Present- Cold Intolerance, Excessive Hunger, Hair Changes, Heat Intolerance, Hot flashes and New Diabetes. Hematology Present- Blood Thinners and Easy Bruising. Not Present- Excessive bleeding, Gland problems, HIV and Persistent Infections.  Vitals (Tanisha A. Brown RMA; 09/10/2018 10:10 AM) 09/10/2018 10:09 AM Weight: 151 lb Height: 65in Body Surface Area: 1.76 m Body Mass Index: 25.13 kg/m  Temp.: 98.64F  Pulse: 76 (Regular)  BP: 128/84 (Sitting, Left Arm, Standard)       Physical Exam  (Enrrique Mierzwa A. Ninfa Linden MD; 09/10/2018 10:22 AM) The physical exam findings are as follows: Note:On examination, he is well-appearing. His abdomen is soft. There is a small  lesion on the left side of the abdomen above the old ostomy site which is less than a centimeter in size. There is a scab over the palpable nodule. There is no erythema. There is no obvious hernia Lungs clear CV RRR Skin without rashes     Assessment & Plan (Rilda Bulls A. Ninfa Linden MD; 09/10/2018 10:23 AM) WOUND, OPEN, ABDOMINAL WALL, ANTERIOR (S31.109A) Impression: This is a chronic nonhealing abdominal wound. This may represent an underlying permanent suture or foreign body that is causing this persistent infection. Excision of this area is recommended to resolve this problem. I discussed this with the patient and his wife. He would need cardiac clearance and we'll need to stop his anticoagulation medication 2 days preoperatively. I believe this can be done simply as an outpatient surgery with simple excision. We discussed the risk which include but is not limited to bleeding, infection, and recurrence. Surgery will be scheduled

## 2018-10-08 ENCOUNTER — Ambulatory Visit (HOSPITAL_BASED_OUTPATIENT_CLINIC_OR_DEPARTMENT_OTHER): Payer: Medicare Other | Admitting: Anesthesiology

## 2018-10-08 ENCOUNTER — Ambulatory Visit (HOSPITAL_BASED_OUTPATIENT_CLINIC_OR_DEPARTMENT_OTHER)
Admission: RE | Admit: 2018-10-08 | Discharge: 2018-10-08 | Disposition: A | Discharge status: Home / Self Care | Payer: Medicare Other | Source: Ambulatory Visit | Attending: Surgery | Admitting: Surgery

## 2018-10-08 ENCOUNTER — Encounter (HOSPITAL_BASED_OUTPATIENT_CLINIC_OR_DEPARTMENT_OTHER): Payer: Self-pay | Admitting: *Deleted

## 2018-10-08 ENCOUNTER — Other Ambulatory Visit: Payer: Self-pay

## 2018-10-08 ENCOUNTER — Encounter (HOSPITAL_BASED_OUTPATIENT_CLINIC_OR_DEPARTMENT_OTHER)
Admission: RE | Disposition: A | Discharge status: Home / Self Care | Payer: Self-pay | Source: Ambulatory Visit | Attending: Surgery

## 2018-10-08 DIAGNOSIS — I2510 Atherosclerotic heart disease of native coronary artery without angina pectoris: Secondary | ICD-10-CM | POA: Insufficient documentation

## 2018-10-08 DIAGNOSIS — I10 Essential (primary) hypertension: Secondary | ICD-10-CM | POA: Diagnosis not present

## 2018-10-08 DIAGNOSIS — Y838 Other surgical procedures as the cause of abnormal reaction of the patient, or of later complication, without mention of misadventure at the time of the procedure: Secondary | ICD-10-CM | POA: Insufficient documentation

## 2018-10-08 DIAGNOSIS — Z79899 Other long term (current) drug therapy: Secondary | ICD-10-CM | POA: Diagnosis not present

## 2018-10-08 DIAGNOSIS — Z951 Presence of aortocoronary bypass graft: Secondary | ICD-10-CM | POA: Insufficient documentation

## 2018-10-08 DIAGNOSIS — I4891 Unspecified atrial fibrillation: Secondary | ICD-10-CM | POA: Diagnosis not present

## 2018-10-08 DIAGNOSIS — S31109A Unspecified open wound of abdominal wall, unspecified quadrant without penetration into peritoneal cavity, initial encounter: Secondary | ICD-10-CM | POA: Diagnosis not present

## 2018-10-08 DIAGNOSIS — F419 Anxiety disorder, unspecified: Secondary | ICD-10-CM | POA: Diagnosis not present

## 2018-10-08 DIAGNOSIS — Z87891 Personal history of nicotine dependence: Secondary | ICD-10-CM | POA: Diagnosis not present

## 2018-10-08 DIAGNOSIS — T8189XA Other complications of procedures, not elsewhere classified, initial encounter: Secondary | ICD-10-CM | POA: Diagnosis not present

## 2018-10-08 DIAGNOSIS — J449 Chronic obstructive pulmonary disease, unspecified: Secondary | ICD-10-CM | POA: Insufficient documentation

## 2018-10-08 DIAGNOSIS — Z7901 Long term (current) use of anticoagulants: Secondary | ICD-10-CM | POA: Insufficient documentation

## 2018-10-08 DIAGNOSIS — Z881 Allergy status to other antibiotic agents status: Secondary | ICD-10-CM | POA: Diagnosis not present

## 2018-10-08 DIAGNOSIS — L089 Local infection of the skin and subcutaneous tissue, unspecified: Secondary | ICD-10-CM | POA: Diagnosis not present

## 2018-10-08 DIAGNOSIS — I4892 Unspecified atrial flutter: Secondary | ICD-10-CM | POA: Diagnosis not present

## 2018-10-08 DIAGNOSIS — L02211 Cutaneous abscess of abdominal wall: Secondary | ICD-10-CM | POA: Diagnosis not present

## 2018-10-08 HISTORY — PX: EXCISION OF KELOID: SHX6267

## 2018-10-08 HISTORY — DX: Anxiety disorder, unspecified: F41.9

## 2018-10-08 HISTORY — DX: Cardiac arrhythmia, unspecified: I49.9

## 2018-10-08 SURGERY — EXCISION, KELOID
Anesthesia: Monitor Anesthesia Care | Site: Abdomen | Laterality: Left

## 2018-10-08 MED ORDER — PROPOFOL 10 MG/ML IV BOLUS
INTRAVENOUS | Status: DC | PRN
  Administered 2018-10-08 (×3): 20 mg via INTRAVENOUS

## 2018-10-08 MED ORDER — BUPIVACAINE HCL (PF) 0.5 % IJ SOLN
INTRAMUSCULAR | Status: DC | PRN
  Administered 2018-10-08: 7 mL

## 2018-10-08 MED ORDER — PHENYLEPHRINE 40 MCG/ML (10ML) SYRINGE FOR IV PUSH (FOR BLOOD PRESSURE SUPPORT)
PREFILLED_SYRINGE | INTRAVENOUS | Status: AC
  Filled 2018-10-08: qty 10

## 2018-10-08 MED ORDER — MIDAZOLAM HCL 2 MG/2ML IJ SOLN
INTRAMUSCULAR | Status: AC
  Filled 2018-10-08: qty 2

## 2018-10-08 MED ORDER — EPHEDRINE 5 MG/ML INJ
INTRAVENOUS | Status: AC
  Filled 2018-10-08: qty 10

## 2018-10-08 MED ORDER — SCOPOLAMINE 1 MG/3DAYS TD PT72: 1.0000 | MEDICATED_PATCH | Freq: Once | TRANSDERMAL | Status: DC | PRN

## 2018-10-08 MED ORDER — CEFAZOLIN SODIUM-DEXTROSE 2-4 GM/100ML-% IV SOLN
INTRAVENOUS | Status: AC
  Filled 2018-10-08: qty 100

## 2018-10-08 MED ORDER — CHLORHEXIDINE GLUCONATE CLOTH 2 % EX PADS: 6.0000 | MEDICATED_PAD | Freq: Once | CUTANEOUS | Status: DC

## 2018-10-08 MED ORDER — PROMETHAZINE HCL 25 MG/ML IJ SOLN: 6.2500 mg | INTRAMUSCULAR | Status: DC | PRN

## 2018-10-08 MED ORDER — ONDANSETRON HCL 4 MG/2ML IJ SOLN
INTRAMUSCULAR | Status: AC
  Filled 2018-10-08: qty 2

## 2018-10-08 MED ORDER — TRAMADOL HCL 50 MG PO TABS: 50.0000 mg | ORAL_TABLET | Freq: Four times a day (QID) | ORAL | 0 refills | Status: DC | PRN

## 2018-10-08 MED ORDER — LIDOCAINE 2% (20 MG/ML) 5 ML SYRINGE
INTRAMUSCULAR | Status: AC
  Filled 2018-10-08: qty 5

## 2018-10-08 MED ORDER — FENTANYL CITRATE (PF) 100 MCG/2ML IJ SOLN
50.0000 ug | INTRAMUSCULAR | Status: DC | PRN
  Administered 2018-10-08: 50 ug via INTRAVENOUS

## 2018-10-08 MED ORDER — FENTANYL CITRATE (PF) 100 MCG/2ML IJ SOLN: 25.0000 ug | INTRAMUSCULAR | Status: DC | PRN

## 2018-10-08 MED ORDER — MIDAZOLAM HCL 2 MG/2ML IJ SOLN
1.0000 mg | INTRAMUSCULAR | Status: DC | PRN
  Administered 2018-10-08: 1 mg via INTRAVENOUS

## 2018-10-08 MED ORDER — FENTANYL CITRATE (PF) 100 MCG/2ML IJ SOLN
INTRAMUSCULAR | Status: AC
  Filled 2018-10-08: qty 2

## 2018-10-08 MED ORDER — CEFAZOLIN SODIUM-DEXTROSE 2-4 GM/100ML-% IV SOLN
2.0000 g | INTRAVENOUS | Status: AC
  Administered 2018-10-08: 2 g via INTRAVENOUS

## 2018-10-08 MED ORDER — ONDANSETRON HCL 4 MG/2ML IJ SOLN
INTRAMUSCULAR | Status: DC | PRN
  Administered 2018-10-08: 4 mg via INTRAVENOUS

## 2018-10-08 MED ORDER — LACTATED RINGERS IV SOLN
INTRAVENOUS | Status: DC
  Administered 2018-10-08 (×2): via INTRAVENOUS

## 2018-10-08 MED ORDER — SUCCINYLCHOLINE CHLORIDE 200 MG/10ML IV SOSY
PREFILLED_SYRINGE | INTRAVENOUS | Status: AC
  Filled 2018-10-08: qty 10

## 2018-10-08 SURGICAL SUPPLY — 43 items
BLADE HEX COATED 2.75 (ELECTRODE) ×3 IMPLANT
BLADE SURG 15 STRL LF DISP TIS (BLADE) ×1 IMPLANT
BLADE SURG 15 STRL SS (BLADE) ×2
CANISTER SUCT 1200ML W/VALVE (MISCELLANEOUS) IMPLANT
CHLORAPREP W/TINT 26ML (MISCELLANEOUS) ×3 IMPLANT
COVER BACK TABLE 60X90IN (DRAPES) ×3 IMPLANT
COVER MAYO STAND STRL (DRAPES) ×3 IMPLANT
DECANTER SPIKE VIAL GLASS SM (MISCELLANEOUS) ×3 IMPLANT
DERMABOND ADVANCED (GAUZE/BANDAGES/DRESSINGS) ×2
DERMABOND ADVANCED .7 DNX12 (GAUZE/BANDAGES/DRESSINGS) ×1 IMPLANT
DRAPE LAPAROTOMY 100X72 PEDS (DRAPES) ×3 IMPLANT
DRAPE UTILITY XL STRL (DRAPES) ×3 IMPLANT
ELECT REM PT RETURN 9FT ADLT (ELECTROSURGICAL) ×3
ELECTRODE REM PT RTRN 9FT ADLT (ELECTROSURGICAL) ×1 IMPLANT
GLOVE BIO SURGEON STRL SZ 6.5 (GLOVE) ×2 IMPLANT
GLOVE BIO SURGEONS STRL SZ 6.5 (GLOVE) ×1
GLOVE BIOGEL PI IND STRL 6.5 (GLOVE) ×1 IMPLANT
GLOVE BIOGEL PI IND STRL 7.5 (GLOVE) ×1 IMPLANT
GLOVE BIOGEL PI INDICATOR 6.5 (GLOVE) ×2
GLOVE BIOGEL PI INDICATOR 7.5 (GLOVE) ×2
GLOVE SURG SIGNA 7.5 PF LTX (GLOVE) ×3 IMPLANT
GLOVE SURG SS PI 7.5 STRL IVOR (GLOVE) ×3 IMPLANT
GOWN STRL REUS W/ TWL LRG LVL3 (GOWN DISPOSABLE) ×2 IMPLANT
GOWN STRL REUS W/ TWL XL LVL3 (GOWN DISPOSABLE) ×1 IMPLANT
GOWN STRL REUS W/TWL LRG LVL3 (GOWN DISPOSABLE) ×4
GOWN STRL REUS W/TWL XL LVL3 (GOWN DISPOSABLE) ×2
NEEDLE HYPO 25X1 1.5 SAFETY (NEEDLE) ×3 IMPLANT
NS IRRIG 1000ML POUR BTL (IV SOLUTION) ×3 IMPLANT
PACK BASIN DAY SURGERY FS (CUSTOM PROCEDURE TRAY) ×3 IMPLANT
PENCIL BUTTON HOLSTER BLD 10FT (ELECTRODE) ×3 IMPLANT
SLEEVE SCD COMPRESS KNEE MED (MISCELLANEOUS) IMPLANT
SPONGE LAP 4X18 RFD (DISPOSABLE) ×3 IMPLANT
SUT MNCRL AB 4-0 PS2 18 (SUTURE) ×3 IMPLANT
SUT VIC AB 2-0 SH 27 (SUTURE)
SUT VIC AB 2-0 SH 27XBRD (SUTURE) IMPLANT
SUT VIC AB 3-0 SH 27 (SUTURE) ×2
SUT VIC AB 3-0 SH 27X BRD (SUTURE) ×1 IMPLANT
SYR BULB 3OZ (MISCELLANEOUS) IMPLANT
SYR CONTROL 10ML LL (SYRINGE) ×3 IMPLANT
TOWEL GREEN STERILE FF (TOWEL DISPOSABLE) ×3 IMPLANT
TUBE CONNECTING 20'X1/4 (TUBING)
TUBE CONNECTING 20X1/4 (TUBING) IMPLANT
YANKAUER SUCT BULB TIP NO VENT (SUCTIONS) IMPLANT

## 2018-10-08 NOTE — Interval H&P Note (Signed)
History and Physical Interval Note: no change in H and P  10/08/2018 10:57 AM  [name]  has presented today for surgery, with the diagnosis of CHRONIC ABDOMINAL WALL WOUND  The various methods of treatment have been discussed with the patient and family. After consideration of risks, benefits and other options for treatment, the patient has consented to  Procedure(s): EXCISION OF CHRONIC ABDOMINAL WALL WOUND (N/A) as a surgical intervention .  The patient's history has been reviewed, patient examined, no change in status, stable for surgery.  I have reviewed the patient's chart and labs.  Questions were answered to the patient's satisfaction.     Coralie Keens

## 2018-10-08 NOTE — Op Note (Addendum)
EXCISION OF CHRONIC ABDOMINAL WALL WOUND  Procedure Note  ADARRYL GOLDAMMER 10/08/2018   Pre-op Diagnosis: CHRONIC ABDOMINAL WALL WOUND     Post-op Diagnosis: same  Procedure(s): EXCISION OF CHRONIC ABDOMINAL WALL WOUND (2 cm)  Surgeon(s): Coralie Keens, MD  Anesthesia: Monitor Anesthesia Care  Staff:  Circulator: Izora Ribas, RN Scrub Person: Lorenza Burton, CST  Estimated Blood Loss: Minimal               Specimens: sent to path  Procedure: The patient was brought to the operating room and identified as correct patient.  He is placed upon the operating table and anesthesia was induced.  His abdomen was prepped and draped in usual sterile fashion.  There is a small wound in the patient's left lower quadrant several inches above his old incision.  I anesthetized skin with Marcaine.  I then made elliptical incision in the skin with a scalpel.  I then excised the area its entirety with the cautery measuring 2 cm.  The wound appeared to be created from a permanent suture in the fascia which I completely excised.  This appeared to be consistent with a stitch granuloma.  It was sent to pathology for evaluation.  I anesthetized the incision further.  I then irrigated with saline.  I then closed the subcutaneous tissue with 3-0 Vicryl sutures and closed the skin with a 4-0 Monocryl.  Dermabond was then applied.  The patient tolerated the procedure well.  All the counts were correct at the end of the procedure.  The patient was then taken in a stable condition to the recovery room.          Coralie Keens   Date: 10/08/2018  Time: 11:46 AM

## 2018-10-08 NOTE — Discharge Instructions (Signed)
Ok to shower starting tomorrow ° °Ice pack, tylenol, ibuprofen also for pain ° °No vigorous activity for one week ° ° ° ° °Post Anesthesia Home Care Instructions ° °Activity: °Get plenty of rest for the remainder of the day. A responsible individual must stay with you for 24 hours following the procedure.  °For the next 24 hours, DO NOT: °-Drive a car °-Operate machinery °-Drink alcoholic beverages °-Take any medication unless instructed by your physician °-Make any legal decisions or sign important papers. ° °Meals: °Start with liquid foods such as gelatin or soup. Progress to regular foods as tolerated. Avoid greasy, spicy, heavy foods. If nausea and/or vomiting occur, drink only clear liquids until the nausea and/or vomiting subsides. Call your physician if vomiting continues. ° °Special Instructions/Symptoms: °Your throat may feel dry or sore from the anesthesia or the breathing tube placed in your throat during surgery. If this causes discomfort, gargle with warm salt water. The discomfort should disappear within 24 hours. ° °If you had a scopolamine patch placed behind your ear for the management of post- operative nausea and/or vomiting: ° °1. The medication in the patch is effective for 72 hours, after which it should be removed.  Wrap patch in a tissue and discard in the trash. Wash hands thoroughly with soap and water. °2. You may remove the patch earlier than 72 hours if you experience unpleasant side effects which may include dry mouth, dizziness or visual disturbances. °3. Avoid touching the patch. Wash your hands with soap and water after contact with the patch. °  ° °

## 2018-10-08 NOTE — Anesthesia Preprocedure Evaluation (Signed)
Anesthesia Evaluation  Patient identified by MRN, date of birth, ID band Patient awake    Reviewed: Allergy & Precautions, NPO status , Patient's Chart, lab work & pertinent test results  History of Anesthesia Complications Negative for: history of anesthetic complications  Airway Mallampati: I  TM Distance: >3 FB Neck ROM: Full    Dental  (+) Edentulous Upper, Dental Advisory Given   Pulmonary COPD, former smoker,    Pulmonary exam normal        Cardiovascular hypertension, Pt. on medications + CAD and + CABG  Normal cardiovascular exam     Neuro/Psych PSYCHIATRIC DISORDERS Anxiety negative neurological ROS     GI/Hepatic negative GI ROS, Neg liver ROS,   Endo/Other  negative endocrine ROS  Renal/GU negative Renal ROS  negative genitourinary   Musculoskeletal negative musculoskeletal ROS (+)   Abdominal   Peds negative pediatric ROS (+)  Hematology negative hematology ROS (+)   Anesthesia Other Findings   Reproductive/Obstetrics negative OB ROS                             Anesthesia Physical Anesthesia Plan  ASA: III  Anesthesia Plan: MAC   Post-op Pain Management:    Induction:   PONV Risk Score and Plan: 1 and Ondansetron and Propofol infusion  Airway Management Planned: Natural Airway and Simple Face Mask  Additional Equipment:   Intra-op Plan:   Post-operative Plan:   Informed Consent: I have reviewed the patients History and Physical, chart, labs and discussed the procedure including the risks, benefits and alternatives for the proposed anesthesia with the patient or authorized representative who has indicated his/her understanding and acceptance.     Dental advisory given  Plan Discussed with: CRNA, Anesthesiologist and Surgeon  Anesthesia Plan Comments:         Anesthesia Quick Evaluation

## 2018-10-08 NOTE — Anesthesia Postprocedure Evaluation (Signed)
Anesthesia Post Note  Patient: [name]  Procedure(s) Performed: EXCISION OF CHRONIC ABDOMINAL WALL WOUND (Left Abdomen)     Patient location during evaluation: PACU Anesthesia Type: MAC Level of consciousness: awake and alert Pain management: pain level controlled Vital Signs Assessment: post-procedure vital signs reviewed and stable Respiratory status: spontaneous breathing and respiratory function stable Cardiovascular status: stable Postop Assessment: no apparent nausea or vomiting Anesthetic complications: no    Last Vitals:  Vitals:   10/08/18 1215 10/08/18 1230  BP: (!) 171/82 (!) 165/84  Pulse: (!) 56 (!) 48  Resp: 16 18  Temp:    SpO2: 96% 100%    Last Pain:  Vitals:   10/08/18 1215  TempSrc:   PainSc: 0-No pain                 Claire Dolores DANIEL

## 2018-10-08 NOTE — Transfer of Care (Signed)
Immediate Anesthesia Transfer of Care Note  Patient: [name]  Procedure(s) Performed: EXCISION OF CHRONIC ABDOMINAL WALL WOUND (Left Abdomen)  Patient Location: PACU  Anesthesia Type:MAC  Level of Consciousness: awake, alert  and oriented  Airway & Oxygen Therapy: Patient Spontanous Breathing and Patient connected to face mask oxygen  Post-op Assessment: Report given to RN and Post -op Vital signs reviewed and stable  Post vital signs: Reviewed and stable  Last Vitals:  Vitals Value Taken Time  BP    Temp    Pulse    Resp    SpO2      Last Pain:  Vitals:   10/08/18 1037  TempSrc: Oral  PainSc: 0-No pain         Complications: No apparent anesthesia complications

## 2018-10-09 ENCOUNTER — Encounter (HOSPITAL_BASED_OUTPATIENT_CLINIC_OR_DEPARTMENT_OTHER): Payer: Self-pay | Admitting: Surgery

## 2018-10-09 NOTE — Addendum Note (Signed)
Addendum  created 10/09/18 0942 by Tawni Millers, CRNA   Charge Capture section accepted

## 2018-10-23 ENCOUNTER — Other Ambulatory Visit: Payer: Self-pay | Admitting: Family Medicine

## 2018-11-13 ENCOUNTER — Ambulatory Visit (HOSPITAL_COMMUNITY): Payer: Medicare Other | Admitting: Nurse Practitioner

## 2018-12-11 ENCOUNTER — Other Ambulatory Visit: Payer: Self-pay

## 2018-12-11 ENCOUNTER — Encounter (HOSPITAL_COMMUNITY): Payer: Self-pay | Admitting: Nurse Practitioner

## 2018-12-11 ENCOUNTER — Ambulatory Visit (HOSPITAL_COMMUNITY)
Admission: RE | Admit: 2018-12-11 | Discharge: 2018-12-11 | Disposition: A | Discharge status: Home / Self Care | Payer: Medicare Other | Source: Ambulatory Visit | Attending: Nurse Practitioner | Admitting: Nurse Practitioner

## 2018-12-11 DIAGNOSIS — I480 Paroxysmal atrial fibrillation: Secondary | ICD-10-CM | POA: Diagnosis not present

## 2018-12-11 NOTE — Progress Notes (Signed)
Electrophysiology TeleHealth Note   Due to national recommendations of social distancing due to [place] 19, Audio telehealth visit is felt to be most appropriate for this patient at this time.  See MyChart message/consent below from today for patient consent regarding telehealth for the [facility].    Date:  12/11/2018   ID:  [name], DOB November 10, 1945, MRN [id number]  Location: home  Provider location: [address] [place],  [address] Evaluation Performed:  Follow up afib  PCP:  Mikey Kirschner, MD  Primary Cardiologist:    Primary Electrophysiologist:  Dr. Rayann Heman  CC: afib   History of Present Illness: [name] is a 73 y.o. male who presents via audio conferencing for a telehealth visit today. He is on tikosyn now for several years and it has done an excellent job to keep him in [place]. He stll reports no afib. 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 His BP this am is reported at 150/90 with a HR of 60 and regular.   Today, he denies symptoms of palpitations, chest pain, shortness of breath, orthopnea, PND, lower extremity edema, claudication, dizziness, presyncope, syncope, bleeding, or neurologic sequela. The patient is tolerating medications without difficulties and is otherwise without complaint today.   he denies symptoms of cough, fevers, chills, or new SOB worrisome for COVID 19.   Atrial Fibrillation Risk Factors:  he does not have symptoms or diagnosis of sleep apnea. he does not have a history of rheumatic fever. he does not have a history of alcohol use. The patient does not have a history of early familial atrial fibrillation or other arrhythmias.  he has a BMI of Body mass index is 25.02 kg/m.[name] Filed Weights   12/11/18 0946  Weight: 70.3 kg    Past Medical History:  Diagnosis Date   Anxiety    Atrial fibrillation ([place])    Bilateral carotid artery disease ([place]) 07/21/2013   CAD  (coronary artery disease) 2008   CABG   COPD (chronic obstructive pulmonary disease) ([facility])    Dysrhythmia    A-fib   Gastritis    Headache(784.0)    Hypertension    Visit for monitoring Tikosyn therapy 12/2015   Past Surgical History:  Procedure Laterality Date   CARDIOVERSION N/A 09/08/2015   Procedure: CARDIOVERSION;  Surgeon: Thayer Headings, MD;  Location: [facility];  Service: Cardiovascular;  Laterality: N/A;   CORONARY ARTERY BYPASS GRAFT     EXCISION OF KELOID Left 10/08/2018   Procedure: EXCISION OF CHRONIC ABDOMINAL WALL WOUND;  Surgeon: Coralie Keens, MD;  Location: [facility];  Service: General;  Laterality: Left;   INCISION AND DRAINAGE DEEP NECK ABSCESS     ROTATOR CUFF REPAIR Bilateral    TEE WITHOUT CARDIOVERSION N/A 08/02/2015   Procedure: TRANSESOPHAGEAL ECHOCARDIOGRAM (TEE);  Surgeon: Sueanne Margarita, MD;  Location: [facility] ENDOSCOPY;  Service: Cardiovascular;  Laterality: N/A;   TEE WITHOUT CARDIOVERSION N/A 09/08/2015   Procedure: TRANSESOPHAGEAL ECHOCARDIOGRAM (TEE);  Surgeon: Thayer Headings, MD;  [facility]COPY;  Service: Cardiovascular;  Laterality: N/A;     Current Outpatient Medications  Medication Sig Dispense Refill   ALPRAZolam (XANAX) 0.25 MG tablet TAKE 1/2 TO 1 (ONE-HALF TO ONE) TABLET BY MOUTH TWICE DAILY AS NEEDED 30 tablet 5   atorvastatin (LIPITOR) 40 MG tablet TAKE 1 TABLET BY MOUTH ONCE DAILY 90 tablet 1   dofetilide (TIKOSYN) 500 MCG capsule TAKE 1 CAPSULE  BY MOUTH TWICE DAILY 180 capsule 2   lisinopril (PRINIVIL,ZESTRIL) 20 MG tablet TAKE 1 TABLET BY MOUTH ONCE DAILY 90 tablet 0   Multiple Vitamins-Minerals (MULTIVITAMIN WITH MINERALS) tablet Take 1 tablet by mouth at bedtime.      Omega-3 Fatty Acids (FISH OIL) 1200 MG CAPS Take 1,200 mg by mouth at bedtime.      potassium chloride (K-DUR) 10 MEQ tablet TAKE 1 TABLET BY MOUTH ONCE DAILY 90 tablet 1   vitamin C (ASCORBIC ACID) 500 MG tablet Take 500 mg  by mouth at bedtime.      XARELTO 20 MG TABS tablet TAKE 1 TABLET BY MOUTH ONCE DAILY WITH SUPPER 90 tablet 1   traMADol (ULTRAM) 50 MG tablet Take 1 tablet (50 mg total) by mouth every 6 (six) hours as needed for moderate pain or severe pain. (Patient not taking: Reported on 12/11/2018) 15 tablet 0   No current facility-administered medications for this encounter.     Allergies:   Doxycycline and Nsaids   Social History:  The patient  reports that he quit smoking about 12 years ago. He has never used smokeless tobacco. He reports that he does not drink alcohol or use drugs.   Family History:  The patient's  family history includes Diabetes in his mother and sister; Heart attack in his father; Hypertension in his mother and sister; Sudden death in his father.    ROS:  Please see the history of present illness.   All other systems are personally reviewed and negative.   Exam: NA  Recent Labs: 01/17/2018: ALT 16 06/04/2018: BUN 13; Creatinine, Ser 1.14; Magnesium 2.3; Potassium 4.6; Sodium 146  personally reviewed    Other studies personally reviewed: Epic records reviewed   ASSESSMENT AND PLAN:  1.Paroxysmal  atrial fibrillation Doing great staying in SR with Tikosyn on board since  May of 2017. No changes in treatment. Continue xarelto 20 mg daily, no bleeding concerns.   This patients CHA2DS2-VASc Score and unadjusted Ischemic Stroke Rate (% per year) is equal to 4.8 % stroke rate/year from a score of 4  Above score calculated as 1 point each if present [CHF, HTN, DM, Vascular=MI/PAD/Aortic Plaque, Age if 65-74, or Male] Above score calculated as 2 points each if present [Age > 75, or Stroke/TIA/TE]  2.HTN continue current treatment Avoid salt  3. CAD No anginal symptoms  COVID screen The patient does not have any symptoms that suggest any further testing/ screening at this time.  Social distancing reinforced today.    Follow-up: 3 months with EKG and  bmet/mag  Current medicines are reviewed at length with the patient today.   The patient does not have concerns regarding his medicines.  The following changes were made today:  none  Labs/ tests ordered today include: none No orders of the defined types were placed in this encounter.   Patient Risk:  after full review of this patients clinical status, I feel that they are at moderate risk at this time.   Today, I have spent 15 minutes with the patient with telehealth technology discussing afib/covid 19  Signed,  [name] NP 12/11/2018 10:03 AM  Afib [facility] [address] [place], [place] [address] [phone]   I hereby voluntarily request, consent and authorize the [facility] and its employed or contracted physicians, physician assistants, nurse practitioners or other licensed health care professionals (the Practitioner), to provide me with telemedicine health care services (the "Services") as deemed necessary by  the treating Practitioner. I acknowledge and consent to receive the Services by the Practitioner via telemedicine. I understand that the telemedicine visit will involve communicating with the Practitioner through live audiovisual communication technology and the disclosure of certain medical information by electronic transmission. I acknowledge that I have been given the opportunity to request an in-person assessment or other available alternative prior to the telemedicine visit and am voluntarily participating in the telemedicine visit.   I understand that I have the right to withhold or withdraw my consent to the use of telemedicine in the course of my care at any time, without affecting my right to future care or treatment, and that the Practitioner or I may terminate the telemedicine visit at any time. I understand that I have the right to inspect all information obtained and/or recorded in the course of the telemedicine visit and  may receive copies of available information for a reasonable fee.  I understand that some of the potential risks of receiving the Services via telemedicine include:   Delay or interruption in medical evaluation due to technological equipment failure or disruption;  Information transmitted may not be sufficient (e.g. poor resolution of images) to allow for appropriate medical decision making by the Practitioner; and/or  In rare instances, security protocols could fail, causing a breach of personal health information.   Furthermore, I acknowledge that it is my responsibility to provide information about my medical history, conditions and care that is complete and accurate to the best of my ability. I acknowledge that Practitioner's advice, recommendations, and/or decision may be based on factors not within their control, such as incomplete or inaccurate data provided by me or distortions of diagnostic images or specimens that may result from electronic transmissions. I understand that the practice of medicine is not an exact science and that Practitioner makes no warranties or guarantees regarding treatment outcomes. I acknowledge that I will receive a copy of this consent concurrently upon execution via email to the email address I last provided but may also request a printed copy by calling the office of the [facility].  I understand that my insurance will be billed for this visit.   I have read or had this consent read to me.  I understand the contents of this consent, which adequately explains the benefits and risks of the Services being provided via telemedicine.  I have been provided ample opportunity to ask questions regarding this consent and the Services and have had my questions answered to my satisfaction.  I give my informed consent for the services to be provided through the use of telemedicine in my medical care  By participating in this telemedicine visit I agree to the  above.

## 2019-01-20 ENCOUNTER — Other Ambulatory Visit: Payer: Self-pay | Admitting: Family Medicine

## 2019-01-20 ENCOUNTER — Other Ambulatory Visit: Payer: Self-pay | Admitting: Cardiology

## 2019-01-21 DIAGNOSIS — L570 Actinic keratosis: Secondary | ICD-10-CM | POA: Diagnosis not present

## 2019-01-21 DIAGNOSIS — L280 Lichen simplex chronicus: Secondary | ICD-10-CM | POA: Diagnosis not present

## 2019-01-22 NOTE — Telephone Encounter (Signed)
I tried calling pt, no answer

## 2019-01-22 NOTE — Telephone Encounter (Signed)
Call pt, virt visit then ref times one

## 2019-01-23 ENCOUNTER — Telehealth: Payer: Self-pay | Admitting: *Deleted

## 2019-01-23 ENCOUNTER — Other Ambulatory Visit: Payer: Self-pay | Admitting: Cardiology

## 2019-01-23 MED ORDER — DOFETILIDE 500 MCG PO CAPS: 500.0000 ug | ORAL_CAPSULE | Freq: Two times a day (BID) | ORAL | 0 refills | Status: DC

## 2019-01-23 NOTE — Telephone Encounter (Signed)
Fax from [facility] [employer]. Requesting refill on lisinopril 20mg  one daily. 90 day supply. Wellness on 02/06/18. Sick visit in November. No upcoming appt.

## 2019-01-23 NOTE — Telephone Encounter (Signed)
Left message to return call 

## 2019-01-23 NOTE — Telephone Encounter (Signed)
virt visit next week, ref meds times one

## 2019-01-24 ENCOUNTER — Other Ambulatory Visit: Payer: Self-pay | Admitting: *Deleted

## 2019-01-24 MED ORDER — LISINOPRIL 20 MG PO TABS: 20.0000 mg | ORAL_TABLET | Freq: Every day | ORAL | 0 refills | Status: DC

## 2019-01-24 NOTE — Telephone Encounter (Signed)
Discussed with pt's wife who is on dpr and med sent to pharm and transferred to the front to schedule virtual visit next week.

## 2019-01-24 NOTE — Telephone Encounter (Signed)
Patient has appt scheduled with Dr Richardson Landry 01/28/19

## 2019-01-28 ENCOUNTER — Ambulatory Visit (INDEPENDENT_AMBULATORY_CARE_PROVIDER_SITE_OTHER): Payer: Medicare Other | Admitting: Family Medicine

## 2019-01-28 ENCOUNTER — Encounter: Payer: Self-pay | Admitting: Family Medicine

## 2019-01-28 ENCOUNTER — Other Ambulatory Visit: Payer: Self-pay

## 2019-01-28 VITALS — Wt 152.0 lb

## 2019-01-28 DIAGNOSIS — J441 Chronic obstructive pulmonary disease with (acute) exacerbation: Secondary | ICD-10-CM | POA: Diagnosis not present

## 2019-01-28 DIAGNOSIS — E7800 Pure hypercholesterolemia, unspecified: Secondary | ICD-10-CM | POA: Diagnosis not present

## 2019-01-28 DIAGNOSIS — Z125 Encounter for screening for malignant neoplasm of prostate: Secondary | ICD-10-CM

## 2019-01-28 DIAGNOSIS — I10 Essential (primary) hypertension: Secondary | ICD-10-CM | POA: Diagnosis not present

## 2019-01-28 MED ORDER — LISINOPRIL 20 MG PO TABS: 20.0000 mg | ORAL_TABLET | Freq: Every day | ORAL | 1 refills | Status: DC

## 2019-01-28 MED ORDER — ALPRAZOLAM 0.25 MG PO TABS: ORAL_TABLET | ORAL | 5 refills | Status: DC

## 2019-01-28 MED ORDER — POTASSIUM CHLORIDE ER 10 MEQ PO TBCR: 10.0000 meq | EXTENDED_RELEASE_TABLET | Freq: Every day | ORAL | 1 refills | Status: DC

## 2019-01-28 MED ORDER — ATORVASTATIN CALCIUM 40 MG PO TABS: 40.0000 mg | ORAL_TABLET | Freq: Every day | ORAL | 1 refills | Status: DC

## 2019-01-28 NOTE — Progress Notes (Signed)
   Subjective:    Patient ID: [name], male    DOB: [birth year], 73 y.o.   MRN: [id number] Patient interacts via audio for numerous issues Hyperlipidemia  This is a chronic problem. There are no compliance problems.   Hypertension  This is a chronic problem. There are no compliance problems.    Pt states he is working every day but is doing pretty good. Pt states his BP does run high at times but does go back to normal. Pulse runs good all time. Blood pressure medicine and blood pressure levels reviewed today with patient. Compliant with blood pressure medicine. States does not miss a dose. No obvious side effects. Blood pressure generally good when checked elsewhere. Watching salt intake.   Patient continues to take lipid medication regularly. No obvious side effects from it. Generally does not miss a dose. Prior blood work results are reviewed with patient. Patient continues to work on fat intake in diet  Patient has known COPD.  Still has not gone back to smoking thankfully.  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  Wears a mask.  Mostly trying to avoid crowds.     Pt states that knot on his side has been taken care of. [place] took care of it.  Virtual Visit via Video Note  I connected with [name] on 01/28/19 at  8:50 AM EDT by a video enabled telemedicine application and verified that I am speaking with the correct person using two identifiers.  Location: Patient: home Provider: office   I discussed the limitations of evaluation and management by telemedicine and the availability of in person appointments. The patient expressed understanding and agreed to proceed.  History of Present Illness:    Observations/Objective:   Assessment and Plan:   Follow Up Instructions:    I discussed the assessment and treatment plan with the patient. The patient was provided an opportunity to ask questions and all were answered. The patient agreed with the  plan and demonstrated an understanding of the instructions.   The patient was advised to call back or seek an in-person evaluation if the symptoms worsen or if the condition fails to improve as anticipated.  I provided 25 minutes of non-face-to-face time during this encounter.   Vicente Males, LPN    Review of Systems No headache, no major weight loss or weight gain, no chest pain no back pain abdominal pain no change in bowel habits complete ROS otherwise negative     Objective:   Physical Exam  Virtual visit      Assessment & Plan:  Impression 1 COPD.  Importance of avoiding coronavirus discussed with patient.  Compliance with mass discussed.  Exercise encouraged  2.  Hypertension control overall good discussed compliance discussed maintain same meds  3.  Hyperlipidemia.  Medications refilled.  Time for blood work.  Discussed.  Patient wishes to get.  We will do fasting  Follow-up in 6 months for wellness plus chronic

## 2019-01-28 NOTE — Addendum Note (Signed)
Addended by: Vicente Males on: 01/28/2019 09:30 AM   Modules accepted: Orders

## 2019-01-30 ENCOUNTER — Other Ambulatory Visit (HOSPITAL_COMMUNITY): Payer: Self-pay | Admitting: *Deleted

## 2019-01-30 ENCOUNTER — Telehealth: Payer: Self-pay | Admitting: Cardiology

## 2019-01-30 MED ORDER — DOFETILIDE 500 MCG PO CAPS: 500.0000 ug | ORAL_CAPSULE | Freq: Two times a day (BID) | ORAL | 1 refills | Status: DC

## 2019-01-30 NOTE — Telephone Encounter (Signed)
New message:    Patient calling he needs a appt to get a refill. There was nothing in recall. Patient was last seen in 2019. Please call patient back. Please leave a message if patient do not pick up.

## 2019-02-05 DIAGNOSIS — Z125 Encounter for screening for malignant neoplasm of prostate: Secondary | ICD-10-CM | POA: Diagnosis not present

## 2019-02-05 DIAGNOSIS — E7800 Pure hypercholesterolemia, unspecified: Secondary | ICD-10-CM | POA: Diagnosis not present

## 2019-02-05 DIAGNOSIS — J441 Chronic obstructive pulmonary disease with (acute) exacerbation: Secondary | ICD-10-CM | POA: Diagnosis not present

## 2019-02-05 DIAGNOSIS — I10 Essential (primary) hypertension: Secondary | ICD-10-CM | POA: Diagnosis not present

## 2019-02-06 LAB — LIPID PANEL
Chol/HDL Ratio: 3 ratio (ref 0.0–5.0)
Cholesterol, Total: 133 mg/dL (ref 100–199)
HDL: 45 mg/dL (ref >39)
LDL Calculated: 73 mg/dL (ref 0–99)
Triglycerides: 75 mg/dL (ref 0–149)
VLDL Cholesterol Cal: 15 mg/dL (ref 5–40)

## 2019-02-06 LAB — BASIC METABOLIC PANEL
BUN/Creatinine Ratio: 19 (ref 10–24)
BUN: 28 mg/dL — ABNORMAL HIGH (ref 8–27)
CO2: 21 mmol/L (ref 20–29)
Calcium: 9.7 mg/dL (ref 8.6–10.2)
Chloride: 106 mmol/L (ref 96–106)
Creatinine, Ser: 1.51 mg/dL — ABNORMAL HIGH (ref 0.76–1.27)
GFR calc Af Amer: 51 mL/min/{1.73_m2} — ABNORMAL LOW (ref >59)
GFR calc non Af Amer: 45 mL/min/{1.73_m2} — ABNORMAL LOW (ref >59)
Glucose: 92 mg/dL (ref 65–99)
Potassium: 4.7 mmol/L (ref 3.5–5.2)
Sodium: 144 mmol/L (ref 134–144)

## 2019-02-06 LAB — HEPATIC FUNCTION PANEL
ALT: 15 IU/L (ref 0–44)
AST: 32 IU/L (ref 0–40)
Albumin: 4.2 g/dL (ref 3.7–4.7)
Alkaline Phosphatase: 94 IU/L (ref 39–117)
Bilirubin Total: 0.6 mg/dL (ref 0.0–1.2)
Bilirubin, Direct: 0.22 mg/dL (ref 0.00–0.40)
Total Protein: 7 g/dL (ref 6.0–8.5)

## 2019-02-06 LAB — PSA: Prostate Specific Ag, Serum: 1.3 ng/mL (ref 0.0–4.0)

## 2019-02-11 DIAGNOSIS — L280 Lichen simplex chronicus: Secondary | ICD-10-CM | POA: Diagnosis not present

## 2019-02-14 NOTE — Addendum Note (Signed)
Addended by: Vicente Males on: 02/14/2019 02:17 PM   Modules accepted: Orders

## 2019-02-21 ENCOUNTER — Telehealth: Payer: Self-pay | Admitting: Cardiology

## 2019-02-21 NOTE — Telephone Encounter (Signed)
New Message ° ° ° ° °CONSENT FOR TELE-HEALTH VISIT - PLEASE REVIEW TO PATIENT ° °I hereby voluntarily request, consent and authorize [facility] and its employed or contracted physicians, physician assistants, nurse practitioners or other licensed health care professionals (the Practitioner), to provide me with telemedicine health care services (the "Services") as deemed necessary by the treating Practitioner. I acknowledge and consent to receive the Services by the Practitioner via telemedicine. I understand that the telemedicine visit will involve communicating with the Practitioner through live audiovisual communication technology and the disclosure of certain medical information by electronic transmission. I acknowledge that I have been given the opportunity to request an in-person assessment or other available alternative prior to the telemedicine visit and am voluntarily participating in the telemedicine visit. ° °I understand that I have the right to withhold or withdraw my consent to the use of telemedicine in the course of my care at any time, without affecting my right to future care or treatment, and that the Practitioner or I may terminate the ° °telemedicine visit at any time. I understand that I have the right to inspect all information obtained and/or recorded in the course of the telemedicine visit and may receive copies of available information for a reasonable fee. I understand that some of the potential risks of receiving the Services via telemedicine include: ° °Delay or interruption in medical evaluation due to technological equipment failure or disruption; ° °Information transmitted may not be sufficient (e.g. poor resolution of images) to allow for appropriate medical decision making by the Practitioner; and/or ° °In rare instances, security protocols could fail, causing a breach of personal health information. ° °Furthermore, I acknowledge that it is my responsibility to provide  information about my medical history, conditions and care that is complete and accurate to the best of my ability. I acknowledge that Practitioner's advice, recommendations, and/or decision may be based on factors not within their control, such as incomplete or inaccurate data provided by me or distortions of diagnostic images or specimens that may result from electronic transmissions. I understand that the practice of medicine is not an exact science and that Practitioner makes no warranties or guarantees regarding treatment outcomes. I acknowledge that I will receive a copy of this consent concurrently upon execution via email to the email address I last provided but may also request a printed copy by calling the office of [facility]. ° °I understand that my insurance will be billed for this visit. ° °I have read or had this consent read to me. ° °I understand the contents of this consent, which adequately explains the benefits and risks of the Services being provided via telemedicine. ° °I have been provided ample opportunity to ask questions regarding this consent and the Services and have had my questions answered to my satisfaction. ° °I give my informed consent for the services to be provided through the use of telemedicine in my medical care ° °By participating in this telemedicine visit I agree to the above °YES °

## 2019-02-24 ENCOUNTER — Telehealth (INDEPENDENT_AMBULATORY_CARE_PROVIDER_SITE_OTHER): Payer: Medicare Other | Admitting: Cardiology

## 2019-02-24 ENCOUNTER — Other Ambulatory Visit: Payer: Self-pay

## 2019-02-24 ENCOUNTER — Encounter: Payer: Self-pay | Admitting: Cardiology

## 2019-02-24 VITALS — BP 147/88 | HR 64 | Ht 65.0 in | Wt 150.0 lb

## 2019-02-24 DIAGNOSIS — I480 Paroxysmal atrial fibrillation: Secondary | ICD-10-CM | POA: Diagnosis not present

## 2019-02-24 DIAGNOSIS — Z7901 Long term (current) use of anticoagulants: Secondary | ICD-10-CM

## 2019-02-24 DIAGNOSIS — I779 Disorder of arteries and arterioles, unspecified: Secondary | ICD-10-CM

## 2019-02-24 NOTE — Progress Notes (Signed)
Virtual Visit via Telephone Note   This visit type was conducted due to national recommendations for restrictions regarding the COVID-19 Pandemic (e.g. social distancing) in an effort to limit this patient's exposure and mitigate transmission in our community.  Due to his co-morbid illnesses, this patient is at least at moderate risk for complications without adequate follow up.  This format is felt to be most appropriate for this patient at this time.  The patient did not have access to video technology/had technical difficulties with video requiring transitioning to audio format only (telephone).  All issues noted in this document were discussed and addressed.  No physical exam could be performed with this format.  Please refer to the patient's chart for his  consent to telehealth for [facility].   Date:  02/24/2019   ID:  [name], DOB September 07, 1945, MRN [id number]  Patient Location: Home Provider Location: Home  PCP:  Mikey Kirschner, MD  Cardiologist:  Candee Furbish, MD  Electrophysiologist:  None   Evaluation Performed:  Follow-Up Visit  Chief Complaint:  AFIB follow up  History of Present Illness:    [name] is a 73 y.o. male with Tikosyn, antiarrhythmic use for atrial fibrillation paroxysmal for several years.  Doing quite well sinus rhythm.     The patient does not have symptoms concerning for COVID-19 infection (fever, chills, cough, or new shortness of breath).    Past Medical History:  Diagnosis Date  . Anxiety   . Atrial fibrillation ([place])   . Bilateral carotid artery disease ([place]) 07/21/2013  . CAD (coronary artery disease) 2008   CABG  . COPD (chronic obstructive pulmonary disease) ([place])   . Dysrhythmia    A-fib  . Gastritis   . Headache(784.0)   . Hypertension   . Visit for monitoring Tikosyn therapy 12/2015   Past Surgical History:  Procedure Laterality Date  . CARDIOVERSION N/A 09/08/2015   Procedure: CARDIOVERSION;  Surgeon: Thayer Headings, MD;  Location: [facility];  Service: Cardiovascular;  Laterality: N/A;  . CORONARY ARTERY BYPASS GRAFT    . EXCISION OF KELOID Left 10/08/2018   Procedure: EXCISION OF CHRONIC ABDOMINAL WALL WOUND;  Surgeon: Coralie Keens, MD;  Location: [facility];  Service: General;  Laterality: Left;  . INCISION AND DRAINAGE DEEP NECK ABSCESS    . ROTATOR CUFF REPAIR Bilateral   . TEE WITHOUT CARDIOVERSION N/A 08/02/2015   Procedure: TRANSESOPHAGEAL ECHOCARDIOGRAM (TEE);  Surgeon: Sueanne Margarita, MD;  Location: [facility] ENDOSCOPY;  Service: Cardiovascular;  Laterality: N/A;  . TEE WITHOUT CARDIOVERSION N/A 09/08/2015   Procedure: TRANSESOPHAGEAL ECHOCARDIOGRAM (TEE);  Surgeon: Thayer Headings, MD;  Location: [facility];  Service: Cardiovascular;  Laterality: N/A;     Current Meds  Medication Sig  . ALPRAZolam (XANAX) 0.25 MG tablet TAKE 1/2 TO 1 (ONE-HALF TO ONE) TABLET BY MOUTH TWICE DAILY AS NEEDED  . atorvastatin (LIPITOR) 40 MG tablet Take 1 tablet (40 mg total) by mouth daily.  [name] dofetilide (TIKOSYN) 500 MCG capsule Take 1 capsule (500 mcg total) by mouth 2 (two) times daily.  [name] lisinopril (ZESTRIL) 20 MG tablet Take 1 tablet (20 mg total) by mouth daily.  . Multiple Vitamins-Minerals (MULTIVITAMIN WITH MINERALS) tablet Take 1 tablet by mouth at bedtime.   . Omega-3 Fatty Acids (FISH OIL) 1200 MG CAPS Take 1,200 mg by mouth at bedtime.   . potassium chloride (K-DUR) 10 MEQ tablet Take 1 tablet (10 mEq total) by mouth daily.  [name]  vitamin C (ASCORBIC ACID) 500 MG tablet Take 500 mg by mouth at bedtime.   [name] 20 MG TABS tablet TAKE 1 TABLET BY MOUTH ONCE DAILY WITH SUPPER     Allergies:   Doxycycline and Nsaids   Social History   Tobacco Use  . Smoking status: Former Smoker    Quit date: 08/28/2006    Years since quitting: 12.5  . Smokeless tobacco: Never Used  Substance Use Topics  . Alcohol use: No    Comment: quit drinking in the 70's  . Drug use: No     Family  Hx: The patient's family history includes Diabetes in his mother and sister; Heart attack in his father; Hypertension in his mother and sister; Sudden death in his father.  ROS:   Please see the history of present illness.    Denies any fevers chills nausea vomiting syncope bleeding All other systems reviewed and are negative.   Prior CV studies:   The following studies were reviewed today:  Prior testing as below reviewed  Labs/Other Tests and Data Reviewed:    EKG:  Most recent ECG reviewed shows normal QTC.  Recent Labs: 06/04/2018: Magnesium 2.3 02/05/2019: ALT 15; BUN 28; Creatinine, Ser 1.51; Potassium 4.7; Sodium 144   Recent Lipid Panel Lab Results  Component Value Date/Time   CHOL 133 02/05/2019 08:17 AM   TRIG 75 02/05/2019 08:17 AM   HDL 45 02/05/2019 08:17 AM   CHOLHDL 3.0 02/05/2019 08:17 AM   CHOLHDL 2.6 08/03/2014 08:22 AM   LDLCALC 73 02/05/2019 08:17 AM    Wt Readings from Last 3 Encounters:  02/24/19 150 lb (68 kg)  01/28/19 152 lb (68.9 kg)  12/11/18 155 lb (70.3 kg)     Objective:    Vital Signs:  BP (!) 147/88   Pulse 64   Ht 5\' 5"  (1.651 m)   Wt 150 lb (68 kg)   BMI 24.96 kg/m    VITAL SIGNS:  reviewed pleasant, no acute distress.  Able to complete full sentences without difficulty.  ASSESSMENT & PLAN:    Paroxysmal atrial fibrillation -Doing well on Tikosyn antiarrhythmic with Xarelto.  Last creatinine was 1.5.  Dr. Wolfgang Phoenix is monitoring this renal function.  No bleeding.  Last QTc within range.  Essential hypertension -Continue to watch.  Blood pressure does fluctuate he states.  Avoid salt.  Coronary artery disease -Stable, no anginal symptoms noted.  Carotid artery disease -Stable right internal carotid artery 70% left 40 to 50%. -Last study in 2019 showedRight carotid - 40-59%.  Left carotid - 1-39%   12 months follow up, Repeat.   Chronic anticoagulation - No significant bleeding episodes. COVID-19 Education: The signs  and symptoms of COVID-19 were discussed with the patient and how to seek care for testing (follow up with PCP or arrange E-visit).  The importance of social distancing was discussed today.  Time:   Today, I have spent 15 minutes with the patient with telehealth technology discussing the above problems.     Medication Adjustments/Labs and Tests Ordered: Current medicines are reviewed at length with the patient today.  Concerns regarding medicines are outlined above.   Tests Ordered: No orders of the defined types were placed in this encounter.   Medication Changes: No orders of the defined types were placed in this encounter.   Follow Up:  Virtual Visit or In Person in 6 month(s)  Signed, Candee Furbish, MD  02/24/2019 8:58 AM    [place]

## 2019-02-24 NOTE — Patient Instructions (Signed)
Medication Instructions:  The current medical regimen is effective;  continue present plan and medications.  If you need a refill on your cardiac medications before your next appointment, please call your pharmacy.   Follow-Up: Follow up in 6 months with Dr. Skains.  You will receive a letter in the mail 2 months before you are due.  Please call us when you receive this letter to schedule your follow up appointment.   Thank you for choosing [facility]!!       

## 2019-03-05 ENCOUNTER — Other Ambulatory Visit: Payer: Self-pay

## 2019-03-05 ENCOUNTER — Encounter (HOSPITAL_COMMUNITY): Payer: Self-pay | Admitting: Nurse Practitioner

## 2019-03-05 ENCOUNTER — Ambulatory Visit (HOSPITAL_COMMUNITY)
Admission: RE | Admit: 2019-03-05 | Discharge: 2019-03-05 | Disposition: A | Discharge status: Home / Self Care | Payer: Medicare Other | Source: Ambulatory Visit | Attending: Nurse Practitioner | Admitting: Nurse Practitioner

## 2019-03-05 VITALS — BP 136/82 | HR 129 | Ht 65.0 in | Wt 146.0 lb

## 2019-03-05 DIAGNOSIS — Z79899 Other long term (current) drug therapy: Secondary | ICD-10-CM | POA: Insufficient documentation

## 2019-03-05 DIAGNOSIS — Z8249 Family history of ischemic heart disease and other diseases of the circulatory system: Secondary | ICD-10-CM | POA: Insufficient documentation

## 2019-03-05 DIAGNOSIS — Z951 Presence of aortocoronary bypass graft: Secondary | ICD-10-CM | POA: Diagnosis not present

## 2019-03-05 DIAGNOSIS — I25810 Atherosclerosis of coronary artery bypass graft(s) without angina pectoris: Secondary | ICD-10-CM | POA: Insufficient documentation

## 2019-03-05 DIAGNOSIS — Z87891 Personal history of nicotine dependence: Secondary | ICD-10-CM | POA: Insufficient documentation

## 2019-03-05 DIAGNOSIS — I10 Essential (primary) hypertension: Secondary | ICD-10-CM | POA: Insufficient documentation

## 2019-03-05 DIAGNOSIS — Z886 Allergy status to analgesic agent status: Secondary | ICD-10-CM | POA: Insufficient documentation

## 2019-03-05 DIAGNOSIS — F419 Anxiety disorder, unspecified: Secondary | ICD-10-CM | POA: Diagnosis not present

## 2019-03-05 DIAGNOSIS — J449 Chronic obstructive pulmonary disease, unspecified: Secondary | ICD-10-CM | POA: Insufficient documentation

## 2019-03-05 DIAGNOSIS — Z7901 Long term (current) use of anticoagulants: Secondary | ICD-10-CM | POA: Insufficient documentation

## 2019-03-05 DIAGNOSIS — Z833 Family history of diabetes mellitus: Secondary | ICD-10-CM | POA: Insufficient documentation

## 2019-03-05 DIAGNOSIS — Z881 Allergy status to other antibiotic agents status: Secondary | ICD-10-CM | POA: Insufficient documentation

## 2019-03-05 DIAGNOSIS — Z8679 Personal history of other diseases of the circulatory system: Secondary | ICD-10-CM | POA: Insufficient documentation

## 2019-03-05 DIAGNOSIS — I480 Paroxysmal atrial fibrillation: Secondary | ICD-10-CM | POA: Insufficient documentation

## 2019-03-05 LAB — BASIC METABOLIC PANEL
Anion gap: 7 (ref 5–15)
BUN: 13 mg/dL (ref 8–23)
CO2: 26 mmol/L (ref 22–32)
Calcium: 9.1 mg/dL (ref 8.9–10.3)
Chloride: 107 mmol/L (ref 98–111)
Creatinine, Ser: 1.19 mg/dL (ref 0.61–1.24)
GFR calc Af Amer: >60 mL/min (ref >60)
GFR calc non Af Amer: 59 mL/min — ABNORMAL LOW (ref >60)
Glucose, Bld: 103 mg/dL — ABNORMAL HIGH (ref 70–99)
Potassium: 4.4 mmol/L (ref 3.5–5.1)
Sodium: 140 mmol/L (ref 135–145)

## 2019-03-05 LAB — MAGNESIUM: Magnesium: 2.2 mg/dL (ref 1.7–2.4)

## 2019-03-05 MED ORDER — DILTIAZEM HCL 30 MG PO TABS: ORAL_TABLET | ORAL | 1 refills | Status: DC

## 2019-03-05 NOTE — Patient Instructions (Addendum)
Cardizem 30mg  -- take 1 tablet every 4 hours AS NEEDED for AFIB heart rate >100 as long as top of blood pressure >100.   If heart rates still out of rhythm end of week call and let me know [phone]

## 2019-03-05 NOTE — Progress Notes (Signed)
Patient ID: [name], male   DOB: 27-Mar-1946, 73 y.o.   MRN: [id number]     Primary Care Physician: Mikey Kirschner, MD Referring Physician:Dr. [name] is a 73 y.o. male with a h/o PAF that underwent tikosyn loading at [facility] 01/10/16 . He has held in [place] very nicely but today is in for routine Tikosyn surveillance f/u and found to be in asymptomatic afib with RVR.   No change form his usual health. 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 Tries to stay hydrated. Wears the mask when he has too but feels Covid-19 is being over blown by the media.   Today, he denies symptoms of palpitations, chest pain, shortness of breath, orthopnea, PND, lower extremity edema, dizziness, presyncope, syncope, or neurologic sequela. The patient is tolerating medications without difficulties and is otherwise without complaint today.   Past Medical History:  Diagnosis Date  . Anxiety   . Atrial fibrillation ([place])   . Bilateral carotid artery disease ([place]) 07/21/2013  . CAD (coronary artery disease) 2008   CABG  . COPD (chronic obstructive pulmonary disease) ([place])   . Dysrhythmia    A-fib  . Gastritis   . Headache(784.0)   . Hypertension   . Visit for monitoring Tikosyn therapy 12/2015   Past Surgical History:  Procedure Laterality Date  . CARDIOVERSION N/A 09/08/2015   Procedure: CARDIOVERSION;  Surgeon: Thayer Headings, MD;  Location: [place];  Service: Cardiovascular;  Laterality: N/A;  . CORONARY ARTERY BYPASS GRAFT    . EXCISION OF KELOID Left 10/08/2018   Procedure: EXCISION OF CHRONIC ABDOMINAL WALL WOUND;  Surgeon: Coralie Keens, MD;  Location: [place];  Service: General;  Laterality: Left;  . INCISION AND DRAINAGE DEEP NECK ABSCESS    . ROTATOR CUFF REPAIR Bilateral   . TEE WITHOUT CARDIOVERSION N/A 08/02/2015   Procedure: TRANSESOPHAGEAL ECHOCARDIOGRAM (TEE);  Surgeon: Sueanne Margarita, MD;  Location: [facility] ENDOSCOPY;   Service: Cardiovascular;  Laterality: N/A;  . TEE WITHOUT CARDIOVERSION N/A 09/08/2015   Procedure: TRANSESOPHAGEAL ECHOCARDIOGRAM (TEE);  Surgeon: Thayer Headings, MD;  Location: [facility] ENDOSCOPY;  Service: Cardiovascular;  Laterality: N/A;    Current Outpatient Medications  Medication Sig Dispense Refill  . ALPRAZolam (XANAX) 0.25 MG tablet TAKE 1/2 TO 1 (ONE-HALF TO ONE) TABLET BY MOUTH TWICE DAILY AS NEEDED 30 tablet 5  . atorvastatin (LIPITOR) 40 MG tablet Take 1 tablet (40 mg total) by mouth daily. 90 tablet 1  . dofetilide (TIKOSYN) 500 MCG capsule Take 1 capsule (500 mcg total) by mouth 2 (two) times daily. 180 capsule 1  . lisinopril (ZESTRIL) 20 MG tablet Take 1 tablet (20 mg total) by mouth daily. 90 tablet 0  . Multiple Vitamins-Minerals (MULTIVITAMIN WITH MINERALS) tablet Take 1 tablet by mouth at bedtime.     . Omega-3 Fatty Acids (FISH OIL) 1200 MG CAPS Take 1,200 mg by mouth at bedtime.     . potassium chloride (K-DUR) 10 MEQ tablet Take 1 tablet (10 mEq total) by mouth daily. 90 tablet 1  . vitamin C (ASCORBIC ACID) 500 MG tablet Take 500 mg by mouth at bedtime.     [name] 20 MG TABS tablet TAKE 1 TABLET BY MOUTH ONCE DAILY WITH SUPPER 90 tablet 1  . diltiazem (CARDIZEM) 30 MG tablet Take 1 tablet every 4 hours AS NEEDED for heart rate >100 as long as blood pressure >100. 45 tablet 1  No current facility-administered medications for this encounter.     Allergies  Allergen Reactions  . Doxycycline Shortness Of Breath    Also has to urinate more often  . Nsaids Hypertension    Gastritis     Social History   Socioeconomic History  . Marital status: Married    Spouse name: Not on file  . Number of children: Not on file  . Years of education: Not on file  . Highest education level: Not on file  Occupational History  . Not on file  Social Needs  . Financial resource strain: Not on file  . Food insecurity    Worry: Not on file    Inability: Not on file  .  Transportation needs    Medical: Not on file    Non-medical: Not on file  Tobacco Use  . Smoking status: Former Smoker    Quit date: 08/28/2006    Years since quitting: 12.5  . Smokeless tobacco: Never Used  Substance and Sexual Activity  . Alcohol use: No    Comment: quit drinking in the 70's  . Drug use: No  . Sexual activity: Not on file  Lifestyle  . Physical activity    Days per week: Not on file    Minutes per session: Not on file  . Stress: Not on file  Relationships  . Social Herbalist on phone: Not on file    Gets together: Not on file    Attends religious service: Not on file    Active member of club or organization: Not on file    Attends meetings of clubs or organizations: Not on file    Relationship status: Not on file  . Intimate partner violence    Fear of current or ex partner: Not on file    Emotionally abused: Not on file    Physically abused: Not on file    Forced sexual activity: Not on file  Other Topics Concern  . Not on file  Social History Narrative  . Not on file    Family History  Problem Relation Age of Onset  . Diabetes Mother   . Hypertension Mother   . Heart attack Father   . Sudden death Father   . Hypertension Sister   . Diabetes Sister     ROS- All systems are reviewed and negative except as per the HPI above  Physical Exam: Vitals:   03/05/19 1005  BP: 136/82  Pulse: (!) 129  Weight: 66.2 kg  Height: 5\' 5"  (1.651 m)    GEN- The patient is well appearing, alert and oriented x 3 today.   Head- normocephalic, atraumatic Eyes-  Sclera clear, conjunctiva pink Ears- hearing intact Oropharynx- clear Neck- supple, no JVP Lymph- no cervical lymphadenopathy Lungs- Clear to ausculation bilaterally, normal work of breathing Heart- Regular rate and rhythm, no murmurs, rubs or gallops, PMI not laterally displaced GI- soft, NT, ND, + BS Extremities- no clubbing, cyanosis, or edema MS- no significant deformity or atrophy  Skin- no rash or lesion Psych- euthymic mood, full affect Neuro- strength and sensation are intact  EKG-afib at 129 bpm, qrs int 76 ms, qtc 495 ms Epic records reviewed  Assessment and Plan: 1. Paroxysmal atrial fibrillation Has been staying in [place] very nicely but caught the pt in afib with RVR this am, asymptomatic Continue tikosyn 500 mg bid Will add cardizem 30 mg q 4 hours for HR over 619, BP systolic over 509 He will monitor  over the next few days and let us know if he does not convert Continue xarelto 20 mg a day for chadsvasc score of at least 3 Bmet/mag today  2. H/o Carotid artery disease  Per Dr. Marlou Porch No anginal symptoms  3. HTN stable   Will wait to hear from pt if he converts, if not will plan for cardioversion  Butch Penny C. Hercules Hasler, [facility] [address] [place], [place] [address] [phone]

## 2019-03-11 ENCOUNTER — Other Ambulatory Visit: Payer: Self-pay | Admitting: Cardiology

## 2019-03-11 DIAGNOSIS — I10 Essential (primary) hypertension: Secondary | ICD-10-CM

## 2019-03-11 DIAGNOSIS — I4892 Unspecified atrial flutter: Secondary | ICD-10-CM

## 2019-03-11 NOTE — Telephone Encounter (Signed)
Prescription refill request for Xarelto received.   Last office visit:  (03-05-2019), [name] Weight: 68.9 kg (6- 09-2018) Age: 73 y.o. Scr: 1.19 (03-05-2019) CrCl: 52 ml/min  Prescription refill sent.

## 2019-04-17 ENCOUNTER — Telehealth (HOSPITAL_COMMUNITY): Payer: Self-pay | Admitting: *Deleted

## 2019-04-17 MED ORDER — AMLODIPINE BESYLATE 5 MG PO TABS: 5.0000 mg | ORAL_TABLET | Freq: Every day | ORAL | 6 refills | Status: DC

## 2019-04-17 NOTE — Telephone Encounter (Signed)
Patient called in having issues with his BP being elevated. His heart rate settled down after his last visit with using the 30mg  of cardizem. He has had to take 2 more doses of this but thinks his afib episodes are related to heat/dehydration. BP has been running from the 150-198/70-80s. Discussed with [name] NP - will start Norvasc 5mg  once a day and call with BP report in 7-10 days. Pt verbalized understanding. Will also monitor his diet for sodium intake.

## 2019-07-28 ENCOUNTER — Other Ambulatory Visit: Payer: Self-pay | Admitting: Family Medicine

## 2019-07-28 NOTE — Telephone Encounter (Signed)
Six mo 

## 2019-08-07 ENCOUNTER — Other Ambulatory Visit: Payer: Self-pay | Admitting: Cardiology

## 2019-08-07 ENCOUNTER — Ambulatory Visit (HOSPITAL_COMMUNITY)
Admission: RE | Admit: 2019-08-07 | Discharge: 2019-08-07 | Disposition: A | Discharge status: Home / Self Care | Payer: Medicare Other | Source: Ambulatory Visit | Attending: Internal Medicine | Admitting: Internal Medicine

## 2019-08-07 ENCOUNTER — Other Ambulatory Visit: Payer: Self-pay

## 2019-08-07 DIAGNOSIS — I779 Disorder of arteries and arterioles, unspecified: Secondary | ICD-10-CM | POA: Diagnosis not present

## 2019-09-02 ENCOUNTER — Other Ambulatory Visit: Payer: Self-pay | Admitting: Family Medicine

## 2019-09-02 ENCOUNTER — Telehealth: Payer: Self-pay | Admitting: Family Medicine

## 2019-09-02 ENCOUNTER — Other Ambulatory Visit (HOSPITAL_COMMUNITY): Payer: Self-pay | Admitting: *Deleted

## 2019-09-02 DIAGNOSIS — I10 Essential (primary) hypertension: Secondary | ICD-10-CM

## 2019-09-02 DIAGNOSIS — I4892 Unspecified atrial flutter: Secondary | ICD-10-CM

## 2019-09-02 MED ORDER — RIVAROXABAN 20 MG PO TABS: ORAL_TABLET | ORAL | 1 refills | Status: DC

## 2019-09-02 MED ORDER — ALPRAZOLAM 0.25 MG PO TABS: ORAL_TABLET | ORAL | 5 refills | Status: DC

## 2019-09-02 MED ORDER — ATORVASTATIN CALCIUM 40 MG PO TABS: 40.0000 mg | ORAL_TABLET | Freq: Every day | ORAL | 1 refills | Status: DC

## 2019-09-02 MED ORDER — POTASSIUM CHLORIDE ER 10 MEQ PO TBCR: 10.0000 meq | EXTENDED_RELEASE_TABLET | Freq: Every day | ORAL | 1 refills | Status: DC

## 2019-09-02 MED ORDER — DOFETILIDE 500 MCG PO CAPS: 500.0000 ug | ORAL_CAPSULE | Freq: Two times a day (BID) | ORAL | 1 refills | Status: DC

## 2019-09-02 NOTE — Telephone Encounter (Signed)
Pt seen 01/28/2019 for HTN. Please advise. Thank you.

## 2019-09-02 NOTE — Telephone Encounter (Signed)
FYI for Nurses patient is wanting all medication switched to [facility] scales please

## 2019-09-02 NOTE — Telephone Encounter (Signed)
Ok six mo sched virt visit next mo

## 2019-09-02 NOTE — Telephone Encounter (Signed)
6 mo 

## 2019-09-02 NOTE — Telephone Encounter (Signed)
Medications sent in and pt wife aware. Pt wife transferred up front to set up appt for next month

## 2019-09-02 NOTE — Telephone Encounter (Signed)
Reordered through phone message

## 2019-09-12 ENCOUNTER — Ambulatory Visit (INDEPENDENT_AMBULATORY_CARE_PROVIDER_SITE_OTHER): Payer: Medicare Other | Admitting: Cardiology

## 2019-09-12 ENCOUNTER — Encounter: Payer: Self-pay | Admitting: Cardiology

## 2019-09-12 ENCOUNTER — Other Ambulatory Visit: Payer: Self-pay

## 2019-09-12 VITALS — BP 130/70 | HR 60 | Ht 65.0 in | Wt 157.0 lb

## 2019-09-12 DIAGNOSIS — Z79899 Other long term (current) drug therapy: Secondary | ICD-10-CM | POA: Diagnosis not present

## 2019-09-12 DIAGNOSIS — E7800 Pure hypercholesterolemia, unspecified: Secondary | ICD-10-CM

## 2019-09-12 DIAGNOSIS — Z7901 Long term (current) use of anticoagulants: Secondary | ICD-10-CM

## 2019-09-12 DIAGNOSIS — I480 Paroxysmal atrial fibrillation: Secondary | ICD-10-CM | POA: Diagnosis not present

## 2019-09-12 NOTE — Patient Instructions (Signed)
Medication Instructions:  The current medical regimen is effective;  continue present plan and medications.  *If you need a refill on your cardiac medications before your next appointment, please call your pharmacy*  Lab Work: Please have blood work today (CBC, BMP) If you have labs (blood work) drawn today and your tests are completely normal, you will receive your results only by: [name] MyChart Message (if you have MyChart) OR . A paper copy in the mail If you have any lab test that is abnormal or we need to change your treatment, we will call you to review the results.  Follow-Up: At [facility], you and your health needs are our priority.  As part of our continuing mission to provide you with exceptional heart care, we have created designated Provider Care Teams.  These Care Teams include your primary Cardiologist (physician) and Advanced Practice Providers (APPs -  Physician Assistants and Nurse Practitioners) who all work together to provide you with the care you need, when you need it.  Your next appointment:   6 month(s)  The format for your next appointment:   In Person  Provider:   [name], NP and 1 year with Dr Marlou Porch.  Thank you for choosing [facility]!!

## 2019-09-12 NOTE — Progress Notes (Signed)
Cardiology Office Note:    Date:  09/12/2019   ID:  [name], [name] 1946-04-11, MRN [id number]  PCP:  Mikey Kirschner, MD  Cardiologist:  Candee Furbish, MD  Electrophysiologist:  None   Referring MD: Mikey Kirschner, MD     History of Present Illness:    [name] is a 74 y.o. male here for follow-up of paroxysmal atrial fibrillation.  Tikosyn 2017.  Saw atrial fibrillation clinic in July 2024 asymptomatic A. fib with RVR.  Still working as Chief Financial Officer.  On Tikosyn 500 twice daily.  Cardizem 30 mg was added. BP cuff back to normal. Back to 70. Took 3-4 Dilt since then.  Seems to be doing well with this.  Once again he is asymptomatic.  He takes his blood pressure cuff at home which will show increased heart rate and then he would take his diltiazem.  He is only had to do this 3 or 4 times.  No bleeding.  Compliant with his medications.    Past Medical History:  Diagnosis Date  . Anxiety   . Atrial fibrillation ([place])   . Bilateral carotid artery disease ([place]) 07/21/2013  . CAD (coronary artery disease) 2008   CABG  . COPD (chronic obstructive pulmonary disease) ([place])   . Dysrhythmia    A-fib  . Gastritis   . Headache(784.0)   . Hypertension   . Visit for monitoring Tikosyn therapy 12/2015    Past Surgical History:  Procedure Laterality Date  . CARDIOVERSION N/A 09/08/2015   Procedure: CARDIOVERSION;  Surgeon: Thayer Headings, MD;  Location: [place];  Service: Cardiovascular;  Laterality: N/A;  . CORONARY ARTERY BYPASS GRAFT    . EXCISION OF KELOID Left 10/08/2018   Procedure: EXCISION OF CHRONIC ABDOMINAL WALL WOUND;  Surgeon: Coralie Keens, MD;  Location: [place];  Service: General;  Laterality: Left;  . INCISION AND DRAINAGE DEEP NECK ABSCESS    . ROTATOR CUFF REPAIR Bilateral   . TEE WITHOUT CARDIOVERSION N/A 08/02/2015   Procedure: TRANSESOPHAGEAL ECHOCARDIOGRAM (TEE);  Surgeon: Sueanne Margarita, MD;  Location[facility] ENDOSCOPY;   Service: Cardiovascular;  Laterality: N/A;  . TEE WITHOUT CARDIOVERSION N/A 09/08/2015   Procedure: TRANSESOPHAGEAL ECHOCARDIOGRAM (TEE);  Surgeon: Thayer Headings, MD;  Location: [facility] ENDOSCOPY;  Service: Cardiovascular;  Laterality: N/A;    Current Medications: Current Meds  Medication Sig  . ALPRAZolam (XANAX) 0.25 MG tablet TAKE 1/2 TO 1 (ONE-HALF TO ONE) TABLET BY MOUTH TWICE DAILY AS NEEDED  . amLODipine (NORVASC) 5 MG tablet Take 1 tablet (5 mg total) by mouth daily.  [name] atorvastatin (LIPITOR) 40 MG tablet Take 1 tablet (40 mg total) by mouth daily.  [name] diltiazem (CARDIZEM) 30 MG tablet Take 1 tablet every 4 hours AS NEEDED for heart rate >100 as long as blood pressure >100.  [name] dofetilide (TIKOSYN) 500 MCG capsule Take 1 capsule (500 mcg total) by mouth 2 (two) times daily.  [name] lisinopril (ZESTRIL) 20 MG tablet Take 1 tablet (20 mg total) by mouth daily.  . Multiple Vitamins-Minerals (MULTIVITAMIN WITH MINERALS) tablet Take 1 tablet by mouth at bedtime.   . Omega-3 Fatty Acids (FISH OIL) 1200 MG CAPS Take 1,200 mg by mouth at bedtime.   . potassium chloride (KLOR-CON) 10 MEQ tablet Take 1 tablet (10 mEq total) by mouth daily.  . rivaroxaban (XARELTO) 20 MG TABS tablet TAKE 1 TABLET BY MOUTH ONCE DAILY WITH SUPPER  . vitamin C (ASCORBIC ACID) 500 MG tablet Take  500 mg by mouth at bedtime.      Allergies:   Doxycycline and Nsaids   Social History   Socioeconomic History  . Marital status: Married    Spouse name: Not on file  . Number of children: Not on file  . Years of education: Not on file  . Highest education level: Not on file  Occupational History  . Not on file  Tobacco Use  . Smoking status: Former Smoker    Quit date: 08/28/2006    Years since quitting: 13.0  . Smokeless tobacco: Never Used  Substance and Sexual Activity  . Alcohol use: No    Comment: quit drinking in the 70's  . Drug use: No  . Sexual activity: Not on file  Other Topics Concern  . Not on file    Social History Narrative  . Not on file   Social Determinants of Health   Financial Resource Strain:   . Difficulty of Paying Living Expenses: Not on file  Food Insecurity:   . Worried About Charity fundraiser in the Last Year: Not on file  . Ran Out of Food in the Last Year: Not on file  Transportation Needs:   . Lack of Transportation (Medical): Not on file  . Lack of Transportation (Non-Medical): Not on file  Physical Activity:   . Days of Exercise per Week: Not on file  . Minutes of Exercise per Session: Not on file  Stress:   . Feeling of Stress : Not on file  Social Connections:   . Frequency of Communication with Friends and Family: Not on file  . Frequency of Social Gatherings with Friends and Family: Not on file  . Attends Religious Services: Not on file  . Active Member of Clubs or Organizations: Not on file  . Attends Archivist Meetings: Not on file  . Marital Status: Not on file     Family History: The patient's family history includes Diabetes in his mother and sister; Heart attack in his father; Hypertension in his mother and sister; Sudden death in his father.  ROS:   Please see the history of present illness.    No fevers chills nausea vomiting syncope bleeding all other systems reviewed and are negative.  EKGs/Labs/Other Studies Reviewed:    The following studies were reviewed today: ECHO 12/2015  - Compared to the most recent echo in 08/2015, the LVEF is slightly   improved at 45-50% wtih inferoseptal wall motion and strain   abnormalities.  EKG:  None today  Recent Labs: 02/05/2019: ALT 15 03/05/2019: BUN 13; Creatinine, Ser 1.19; Magnesium 2.2; Potassium 4.4; Sodium 140  Recent Lipid Panel    Component Value Date/Time   CHOL 133 02/05/2019 0817   TRIG 75 02/05/2019 0817   HDL 45 02/05/2019 0817   CHOLHDL 3.0 02/05/2019 0817   CHOLHDL 2.6 08/03/2014 0822   VLDL 10 08/03/2014 0822   LDLCALC 73 02/05/2019 0817    Physical Exam:     VS:  BP 130/70   Pulse 60   Ht 5\' 5"  (1.651 m)   Wt 157 lb (71.2 kg)   SpO2 97%   BMI 26.13 kg/m     Wt Readings from Last 3 Encounters:  09/12/19 157 lb (71.2 kg)  03/05/19 146 lb (66.2 kg)  02/24/19 150 lb (68 kg)     GEN:  Well nourished, well developed in no acute distress HEENT: Normal NECK: No JVD; No carotid bruits LYMPHATICS: No lymphadenopathy CARDIAC: RRR,  no murmurs, rubs, gallops, rare ectopy RESPIRATORY:  Clear to auscultation without rales, wheezing or rhonchi  ABDOMEN: Soft, non-tender, non-distended MUSCULOSKELETAL:  No edema; No deformity  SKIN: Warm and dry NEUROLOGIC:  Alert and oriented x 3 PSYCHIATRIC:  Normal affect   ASSESSMENT:    1. Paroxysmal atrial fibrillation (HCC)   2. Medication management   3. Chronic anticoagulation   4. Pure hypercholesterolemia    PLAN:    In order of problems listed above:  Paroxysmal atrial fibrillation -On Tikosyn, as Cardizem 30 PRN.  Will occasionally feel this.  Uses blood pressure cuff to see if he is in A. fib.  Overall reasonable control.  Chronic anticoagulation -Xarelto 20, no bleeding.  We will check a CBC and metabolic profile  Hyperlipidemia -On atorvastatin 40.  Last LDL in June 2020 73.  Excellent.  No myalgias.  Encouraged COVID-19 vaccine.   Medication Adjustments/Labs and Tests Ordered: Current medicines are reviewed at length with the patient today.  Concerns regarding medicines are outlined above.  Orders Placed This Encounter  Procedures  . CBC  . Basic Metabolic Panel (BMET)   No orders of the defined types were placed in this encounter.   Patient Instructions  Medication Instructions:  The current medical regimen is effective;  continue present plan and medications.  *If you need a refill on your cardiac medications before your next appointment, please call your pharmacy*  Lab Work: Please have blood work today (CBC, BMP) If you have labs (blood work) drawn today and your  tests are completely normal, you will receive your results only by: [name] MyChart Message (if you have MyChart) OR . A paper copy in the mail If you have any lab test that is abnormal or we need to change your treatment, we will call you to review the results.  Follow-Up: At [facility], you and your health needs are our priority.  As part of our continuing mission to provide you with exceptional heart care, we have created designated Provider Care Teams.  These Care Teams include your primary Cardiologist (physician) and Advanced Practice Providers (APPs -  Physician Assistants and Nurse Practitioners) who all work together to provide you with the care you need, when you need it.  Your next appointment:   6 month(s)  The format for your next appointment:   In Person  Provider:   [name], NP and 1 year with Dr Marlou Porch.  Thank you for choosing [facility]!!        Signed, Candee Furbish, MD  09/12/2019 12:33 PM    [place]

## 2019-09-13 LAB — BASIC METABOLIC PANEL
BUN/Creatinine Ratio: 12 (ref 10–24)
BUN: 13 mg/dL (ref 8–27)
CO2: 24 mmol/L (ref 20–29)
Calcium: 9.1 mg/dL (ref 8.6–10.2)
Chloride: 104 mmol/L (ref 96–106)
Creatinine, Ser: 1.1 mg/dL (ref 0.76–1.27)
GFR calc Af Amer: 76 mL/min/{1.73_m2} (ref >59)
GFR calc non Af Amer: 65 mL/min/{1.73_m2} (ref >59)
Glucose: 76 mg/dL (ref 65–99)
Potassium: 4.7 mmol/L (ref 3.5–5.2)
Sodium: 140 mmol/L (ref 134–144)

## 2019-09-13 LAB — CBC
Hematocrit: 43.3 % (ref 37.5–51.0)
Hemoglobin: 14.2 g/dL (ref 13.0–17.7)
MCH: 31.1 pg (ref 26.6–33.0)
MCHC: 32.8 g/dL (ref 31.5–35.7)
MCV: 95 fL (ref 79–97)
Platelets: 231 10*3/uL (ref 150–450)
RBC: 4.57 x10E6/uL (ref 4.14–5.80)
RDW: 12 % (ref 11.6–15.4)
WBC: 8.3 10*3/uL (ref 3.4–10.8)

## 2019-10-02 ENCOUNTER — Encounter: Payer: Self-pay | Admitting: Family Medicine

## 2019-10-07 ENCOUNTER — Other Ambulatory Visit: Payer: Self-pay

## 2019-10-07 ENCOUNTER — Ambulatory Visit (INDEPENDENT_AMBULATORY_CARE_PROVIDER_SITE_OTHER): Payer: Medicare Other | Admitting: Family Medicine

## 2019-10-07 DIAGNOSIS — I10 Essential (primary) hypertension: Secondary | ICD-10-CM

## 2019-10-07 DIAGNOSIS — E7800 Pure hypercholesterolemia, unspecified: Secondary | ICD-10-CM | POA: Diagnosis not present

## 2019-10-07 DIAGNOSIS — F5101 Primary insomnia: Secondary | ICD-10-CM

## 2019-10-07 MED ORDER — LISINOPRIL 20 MG PO TABS: 20.0000 mg | ORAL_TABLET | Freq: Every day | ORAL | 1 refills | Status: DC

## 2019-10-07 MED ORDER — ATORVASTATIN CALCIUM 40 MG PO TABS: 40.0000 mg | ORAL_TABLET | Freq: Every day | ORAL | 1 refills | Status: DC

## 2019-10-07 MED ORDER — POTASSIUM CHLORIDE ER 10 MEQ PO TBCR: 10.0000 meq | EXTENDED_RELEASE_TABLET | Freq: Every day | ORAL | 1 refills | Status: DC

## 2019-10-07 MED ORDER — ALPRAZOLAM 0.25 MG PO TABS: ORAL_TABLET | ORAL | 5 refills | Status: DC

## 2019-10-07 NOTE — Progress Notes (Signed)
   Subjective:  Audio video  Patient ID: [name], male    DOB: [birth year], 74 y.o.   MRN: [id number]  Hypertension This is a chronic problem. Risk factors for coronary artery disease include male gender. There are no compliance problems.   Pt states he checks his blood pressure every night. Systolic number is usually high and dystolic number is low (between 75-76)  Virtual Visit via Telephone Note  I connected with [name] on 10/07/19 at  9:30 AM EST by telephone and verified that I am speaking with the correct person using two identifiers.  Location: Patient: home Provider: office   I discussed the limitations, risks, security and privacy concerns of performing an evaluation and management service by telephone and the availability of in person appointments. I also discussed with the patient that there may be a patient responsible charge related to this service. The patient expressed understanding and agreed to proceed.   History of Present Illness:    Observations/Objective:   Assessment and Plan:   Follow Up Instructions:    I discussed the assessment and treatment plan with the patient. The patient was provided an opportunity to ask questions and all were answered. The patient agreed with the plan and demonstrated an understanding of the instructions.   The patient was advised to call back or seek an in-person evaluation if the symptoms worsen or if the condition fails to improve as anticipated.  I provided 22 minutes of non-face-to-face time during this encounter.   Blood pressure medicine and blood pressure levels reviewed today with patient. Compliant with blood pressure medicine. States does not miss a dose. No obvious side effects. Blood pressure generally good when checked elsewhere. Watching salt intake.   Patient continues to take lipid medication regularly. No obvious side effects from it. Generally does not miss a dose. Prior blood work results are  reviewed with patient. Patient continues to work on fat intake in diet  See below   Review of Systems No headache no chest pain no shortness of breath    Objective:   Physical Exam   Virtual     Assessment & Plan:  Impression hypertension.  Good control discussed maintain same meds  2.  Hyperlipidemia.  Ongoing.  Prior blood work reviewed.  We will hold off on blood work this time compliance discussed medications refilled  3.  Insomnia with element of anxiety.  Nighttime Xanax definitely helps patient to maintain  4.  COPD clinically stable  Follow-up in 6 months diet exercise discussed medications refilled warning signs discussed

## 2019-10-08 ENCOUNTER — Ambulatory Visit: Payer: Medicare Other | Admitting: Family Medicine

## 2019-10-09 ENCOUNTER — Ambulatory Visit: Payer: Medicare Other | Admitting: Family Medicine

## 2019-11-02 DIAGNOSIS — Z23 Encounter for immunization: Secondary | ICD-10-CM | POA: Diagnosis not present

## 2019-11-30 DIAGNOSIS — Z23 Encounter for immunization: Secondary | ICD-10-CM | POA: Diagnosis not present

## 2019-12-29 ENCOUNTER — Telehealth: Payer: Self-pay | Admitting: Family Medicine

## 2019-12-29 ENCOUNTER — Other Ambulatory Visit: Payer: Self-pay | Admitting: *Deleted

## 2019-12-29 DIAGNOSIS — I10 Essential (primary) hypertension: Secondary | ICD-10-CM

## 2019-12-29 DIAGNOSIS — Z79899 Other long term (current) drug therapy: Secondary | ICD-10-CM

## 2019-12-29 DIAGNOSIS — E7800 Pure hypercholesterolemia, unspecified: Secondary | ICD-10-CM

## 2019-12-29 NOTE — Telephone Encounter (Signed)
All the same except do not do psa, we stop at this age

## 2019-12-29 NOTE — Telephone Encounter (Signed)
Last labs 01/2019: Lipid, Liver, Met 7, PSA 

## 2019-12-29 NOTE — Telephone Encounter (Signed)
Patient wife notified, orders in epic.

## 2019-12-29 NOTE — Telephone Encounter (Signed)
Pt has CPE on 5/25 and would like lab work done before appt.

## 2020-01-12 DIAGNOSIS — I10 Essential (primary) hypertension: Secondary | ICD-10-CM | POA: Diagnosis not present

## 2020-01-12 DIAGNOSIS — Z79899 Other long term (current) drug therapy: Secondary | ICD-10-CM | POA: Diagnosis not present

## 2020-01-12 DIAGNOSIS — E7800 Pure hypercholesterolemia, unspecified: Secondary | ICD-10-CM | POA: Diagnosis not present

## 2020-01-13 LAB — LIPID PANEL
Chol/HDL Ratio: 2.5 ratio (ref 0.0–5.0)
Cholesterol, Total: 141 mg/dL (ref 100–199)
HDL: 57 mg/dL (ref >39)
LDL Chol Calc (NIH): 70 mg/dL (ref 0–99)
Triglycerides: 71 mg/dL (ref 0–149)
VLDL Cholesterol Cal: 14 mg/dL (ref 5–40)

## 2020-01-13 LAB — BASIC METABOLIC PANEL
BUN/Creatinine Ratio: 13 (ref 10–24)
BUN: 14 mg/dL (ref 8–27)
CO2: 21 mmol/L (ref 20–29)
Calcium: 9.3 mg/dL (ref 8.6–10.2)
Chloride: 106 mmol/L (ref 96–106)
Creatinine, Ser: 1.07 mg/dL (ref 0.76–1.27)
GFR calc Af Amer: 78 mL/min/{1.73_m2} (ref >59)
GFR calc non Af Amer: 67 mL/min/{1.73_m2} (ref >59)
Glucose: 93 mg/dL (ref 65–99)
Potassium: 4.6 mmol/L (ref 3.5–5.2)
Sodium: 144 mmol/L (ref 134–144)

## 2020-01-13 LAB — HEPATIC FUNCTION PANEL
ALT: 13 IU/L (ref 0–44)
AST: 22 IU/L (ref 0–40)
Albumin: 4.2 g/dL (ref 3.7–4.7)
Alkaline Phosphatase: 91 IU/L (ref 48–121)
Bilirubin Total: 0.6 mg/dL (ref 0.0–1.2)
Bilirubin, Direct: 0.16 mg/dL (ref 0.00–0.40)
Total Protein: 6.8 g/dL (ref 6.0–8.5)

## 2020-01-20 ENCOUNTER — Encounter: Payer: Self-pay | Admitting: Family Medicine

## 2020-01-20 ENCOUNTER — Ambulatory Visit (INDEPENDENT_AMBULATORY_CARE_PROVIDER_SITE_OTHER): Payer: Medicare Other | Admitting: Family Medicine

## 2020-01-20 ENCOUNTER — Other Ambulatory Visit: Payer: Self-pay

## 2020-01-20 VITALS — BP 138/74 | Temp 97.7°F | Ht 65.0 in | Wt 146.8 lb

## 2020-01-20 DIAGNOSIS — Z0000 Encounter for general adult medical examination without abnormal findings: Secondary | ICD-10-CM | POA: Diagnosis not present

## 2020-01-20 NOTE — Progress Notes (Signed)
Subjective:    Patient ID: [name], male    DOB: Sep 18, 1945, 74 y.o.   MRN: [id number]  AWV- Annual Wellness Visit  The patient was seen for their annual wellness visit. The patient's past medical history, surgical history, and family history were reviewed. Pertinent vaccines were reviewed ( tetanus, pneumonia, shingles, flu) The patient's medication list was reviewed and updated.  The height and weight were entered.  BMI recorded in electronic record elsewhere  Cognitive screening was completed. Outcome of Mini - Cog: Fail   Falls /depression screening electronically recorded within record elsewhere  Current tobacco usage: none (All patients who use tobacco were given written and verbal information on quitting)  Recent listing of emergency department/hospitalizations over the past year were reviewed.  current specialist the patient sees on a regular basis: none  Patient would like to discuss some pain he is experiencing in his right elbow. It has been bothering him for about 3 months and is getting worse.    Medicare annual wellness visit patient questionnaire was reviewed.  A written screening schedule for the patient for the next 5-10 years was given. Appropriate discussion of followup regarding next visit was discussed.   right handed  Now on ca channel blocker s for the hear t flutter  Working every day   Watching diet  Results for orders placed or performed in visit on 12/29/19  Lipid panel  Result Value Ref Range   Cholesterol, Total 141 100 - 199 mg/dL   Triglycerides 71 0 - 149 mg/dL   HDL 57 >39 mg/dL   VLDL Cholesterol Cal 14 5 - 40 mg/dL   LDL Chol Calc (NIH) 70 0 - 99 mg/dL   Chol/HDL Ratio 2.5 0.0 - 5.0 ratio  Hepatic function panel  Result Value Ref Range   Total Protein 6.8 6.0 - 8.5 g/dL   Albumin 4.2 3.7 - 4.7 g/dL   Bilirubin Total 0.6 0.0 - 1.2 mg/dL   Bilirubin, Direct 0.16 0.00 - 0.40 mg/dL   Alkaline Phosphatase 91 48 - 121 IU/L   AST 22 0 - 40 IU/L   ALT 13 0 - 44 IU/L  Basic metabolic panel  Result Value Ref Range   Glucose 93 65 - 99 mg/dL   BUN 14 8 - 27 mg/dL   Creatinine, Ser 1.07 0.76 - 1.27 mg/dL   GFR calc non Af Amer 67 >59 mL/min/1.73   GFR calc Af Amer 78 >59 mL/min/1.73   BUN/Creatinine Ratio 13 10 - 24   Sodium 144 134 - 144 mmol/L   Potassium 4.6 3.5 - 5.2 mmol/L   Chloride 106 96 - 106 mmol/L   CO2 21 20 - 29 mmol/L   Calcium 9.3 8.6 - 10.2 mg/dL     Review of Systems No headache, no major weight loss or weight gain, no chest pain no back pain abdominal pain no change in bowel habits complete ROS otherwise negative     Objective:   Physical Exam Vitals reviewed.  Constitutional:      Appearance: He is well-developed.  HENT:     Head: Normocephalic and atraumatic.     Right Ear: External ear normal.     Left Ear: External ear normal.     Nose: Nose normal.  Eyes:     Pupils: Pupils are equal, round, and reactive to light.  Neck:     Thyroid: No thyromegaly.  Cardiovascular:     Rate and Rhythm: Normal rate and regular rhythm.  Heart sounds: Normal heart sounds. No murmur.  Pulmonary:     Effort: Pulmonary effort is normal. No respiratory distress.     Breath sounds: Normal breath sounds. No wheezing.  Abdominal:     General: Bowel sounds are normal. There is no distension.     Palpations: Abdomen is soft. There is no mass.     Tenderness: There is no abdominal tenderness.  Genitourinary:    Penis: Normal.   Musculoskeletal:        General: Normal range of motion.     Cervical back: Normal range of motion and neck supple.  Lymphadenopathy:     Cervical: No cervical adenopathy.  Skin:    General: Skin is warm and dry.     Findings: No erythema.  Neurological:     Mental Status: He is alert.     Motor: No abnormal muscle tone.  Psychiatric:        Behavior: Behavior normal.        Judgment: Judgment normal.           Assessment & Plan:  Impression wellness  exam.  Diet discussed.  Exercise discussed.  Colonoscopy 3 years ago.  Patient advised to do he states next year.  Has had a Covid vaccine.  2.  Hypertension good control discussed maintain same meds  3 hyperlipidemia.  Good control in light of patient's coronary artery disease.  Discussed maintain same

## 2020-01-20 NOTE — Patient Instructions (Signed)
Results for orders placed or performed in visit on 12/29/19  Lipid panel  Result Value Ref Range   Cholesterol, Total 141 100 - 199 mg/dL   Triglycerides 71 0 - 149 mg/dL   HDL 57 >39 mg/dL   VLDL Cholesterol Cal 14 5 - 40 mg/dL   LDL Chol Calc (NIH) 70 0 - 99 mg/dL   Chol/HDL Ratio 2.5 0.0 - 5.0 ratio  Hepatic function panel  Result Value Ref Range   Total Protein 6.8 6.0 - 8.5 g/dL   Albumin 4.2 3.7 - 4.7 g/dL   Bilirubin Total 0.6 0.0 - 1.2 mg/dL   Bilirubin, Direct 0.16 0.00 - 0.40 mg/dL   Alkaline Phosphatase 91 48 - 121 IU/L   AST 22 0 - 40 IU/L   ALT 13 0 - 44 IU/L  Basic metabolic panel  Result Value Ref Range   Glucose 93 65 - 99 mg/dL   BUN 14 8 - 27 mg/dL   Creatinine, Ser 1.07 0.76 - 1.27 mg/dL   GFR calc non Af Amer 67 >59 mL/min/1.73   GFR calc Af Amer 78 >59 mL/min/1.73   BUN/Creatinine Ratio 13 10 - 24   Sodium 144 134 - 144 mmol/L   Potassium 4.6 3.5 - 5.2 mmol/L   Chloride 106 96 - 106 mmol/L   CO2 21 20 - 29 mmol/L   Calcium 9.3 8.6 - 10.2 mg/dL

## 2020-01-30 ENCOUNTER — Ambulatory Visit
Admission: EM | Admit: 2020-01-30 | Discharge: 2020-01-30 | Disposition: A | Discharge status: Home / Self Care | Payer: Medicare Other | Attending: Emergency Medicine | Admitting: Emergency Medicine

## 2020-01-30 ENCOUNTER — Encounter: Payer: Self-pay | Admitting: Emergency Medicine

## 2020-01-30 ENCOUNTER — Other Ambulatory Visit: Payer: Self-pay

## 2020-01-30 DIAGNOSIS — J208 Acute bronchitis due to other specified organisms: Secondary | ICD-10-CM

## 2020-01-30 LAB — POCT URINALYSIS DIP (MANUAL ENTRY)
Bilirubin, UA: NEGATIVE
Blood, UA: NEGATIVE
Glucose, UA: NEGATIVE mg/dL
Ketones, POC UA: NEGATIVE mg/dL
Leukocytes, UA: NEGATIVE
Nitrite, UA: NEGATIVE
Protein Ur, POC: NEGATIVE mg/dL
Spec Grav, UA: 1.01 (ref 1.010–1.025)
Urobilinogen, UA: 0.2 E.U./dL
pH, UA: 5.5 (ref 5.0–8.0)

## 2020-01-30 MED ORDER — PREDNISONE 10 MG PO TABS: 20.0000 mg | ORAL_TABLET | Freq: Every day | ORAL | 0 refills | Status: AC

## 2020-01-30 MED ORDER — AMOXICILLIN-POT CLAVULANATE 875-125 MG PO TABS
1.0000 | ORAL_TABLET | Freq: Two times a day (BID) | ORAL | 0 refills | Status: DC
Start: 2020-01-30 — End: 2020-03-10

## 2020-01-30 MED ORDER — BENZONATATE 100 MG PO CAPS
100.0000 mg | ORAL_CAPSULE | Freq: Three times a day (TID) | ORAL | 0 refills | Status: DC
Start: 2020-01-30 — End: 2020-03-10

## 2020-01-30 NOTE — Discharge Instructions (Addendum)
Take medication as prescribed and to completion Follow-up with PCP Return or go to ED for worsening of symptoms

## 2020-01-30 NOTE — ED Provider Notes (Signed)
[facility]    CSN: [id number] Arrival date & time: 01/30/20  1150      History   Chief Complaint Chief Complaint  Patient presents with  . Cough    HPI [name] is a 74 y.o. male.   Who presented to the urgent care for complaint of cough for the past 2 -5 days.  Reports  has a dry cough and now turned into  wet cough.  Reported brown discharge with cough.  Denies sick exposure to COVID, flu or strep.  Denies recent travel.  Denies aggravating or alleviating symptoms.  Denies previous COVID infection.   Denies fever, chills, fatigue, nasal congestion, rhinorrhea, sore throat, cough, SOB, wheezing, chest pain, nausea, vomiting, changes in bowel or bladder habits.       Past Medical History:  Diagnosis Date  . Anxiety   . Atrial fibrillation ([place])   . Bilateral carotid artery disease ([place]) 07/21/2013  . CAD (coronary artery disease) 2008   CABG  . COPD (chronic obstructive pulmonary disease) ([place])   . Dysrhythmia    A-fib  . Gastritis   . Headache(784.0)   . Hypertension   . Visit for monitoring Tikosyn therapy 12/2015    Patient Active Problem List   Diagnosis Date Noted  . Insomnia 02/06/2018  . Visit for monitoring Tikosyn therapy 01/10/2016  . Atrial flutter ([place]) 08/07/2015  . Bilateral carotid artery disease ([place]) 07/21/2013  . CAD (coronary artery disease) of artery bypass graft 01/26/2013  . Chronic obstructive pulmonary disease ([place]) 01/26/2013  . Essential hypertension, benign 01/26/2013  . Hyperlipidemia 01/26/2013    Past Surgical History:  Procedure Laterality Date  . CARDIOVERSION N/A 09/08/2015   Procedure: CARDIOVERSION;  Surgeon: Thayer Headings, MD;  Location: [place];  Service: Cardiovascular;  Laterality: N/A;  . CORONARY ARTERY BYPASS GRAFT    . EXCISION OF KELOID Left 10/08/2018   Procedure: EXCISION OF CHRONIC ABDOMINAL WALL WOUND;  Surgeon: Coralie Keens, MD;  Location: [place];  Service: General;   Laterality: Left;  . INCISION AND DRAINAGE DEEP NECK ABSCESS    . ROTATOR CUFF REPAIR Bilateral   . TEE WITHOUT CARDIOVERSION N/A 08/02/2015   Procedure: TRANSESOPHAGEAL ECHOCARDIOGRAM (TEE);  Surgeon: Sueanne Margarita, MD;  Location: [facility] ENDOSCOPY;  Service: Cardiovascular;  Laterality: N/A;  . TEE WITHOUT CARDIOVERSION N/A 09/08/2015   Procedure: TRANSESOPHAGEAL ECHOCARDIOGRAM (TEE);  Surgeon: Thayer Headings, MD;  Location: [place];  Service: Cardiovascular;  Laterality: N/A;       Home Medications    Prior to Admission medications   Medication Sig Start Date End Date Taking? Authorizing Provider  ALPRAZolam (XANAX) 0.25 MG tablet TAKE 1/2 TO 1 (ONE-HALF TO ONE) TABLET BY MOUTH TWICE DAILY AS NEEDED 10/07/19   Ian Kirschner, MD  amLODipine (NORVASC) 5 MG tablet Take 1 tablet (5 mg total) by mouth daily. 04/17/19 04/16/20  Ian Needs, NP  amoxicillin-clavulanate (AUGMENTIN) 875-125 MG tablet Take 1 tablet by mouth every 12 (twelve) hours. 01/30/20   Karle Desrosier, Ian Hillock, FNP  atorvastatin (LIPITOR) 40 MG tablet Take 1 tablet (40 mg total) by mouth daily. 10/07/19   Ian Kirschner, MD  benzonatate (TESSALON) 100 MG capsule Take 1 capsule (100 mg total) by mouth every 8 (eight) hours. 01/30/20   Adison Reifsteck, Ian Hillock, FNP  diltiazem (CARDIZEM) 30 MG tablet Take 1 tablet every 4 hours AS NEEDED for heart rate >100 as long as blood pressure >100. 03/05/19   [name]  C, NP  dofetilide (TIKOSYN) 500 MCG capsule Take 1 capsule (500 mcg total) by mouth 2 (two) times daily. 09/02/19   Ian Needs, NP  lisinopril (ZESTRIL) 20 MG tablet Take 1 tablet (20 mg total) by mouth daily. 10/07/19   Ian Kirschner, MD  Multiple Vitamins-Minerals (MULTIVITAMIN WITH MINERALS) tablet Take 1 tablet by mouth at bedtime.     [provider]  Omega-3 Fatty Acids (FISH OIL) 1200 MG CAPS Take 1,200 mg by mouth at bedtime.     [provider]  potassium chloride (KLOR-CON) 10 MEQ tablet Take 1  tablet (10 mEq total) by mouth daily. 10/07/19   Ian Kirschner, MD  predniSONE (DELTASONE) 10 MG tablet Take 2 tablets (20 mg total) by mouth daily for 5 days. 01/30/20 02/04/20  [name], Ian Hillock, FNP  rivaroxaban (XARELTO) 20 MG TABS tablet TAKE 1 TABLET BY MOUTH ONCE DAILY WITH SUPPER 09/02/19   Ian Needs, NP  vitamin C (ASCORBIC ACID) 500 MG tablet Take 500 mg by mouth at bedtime.     [provider]    Family History Family History  Problem Relation Age of Onset  . Diabetes Mother   . Hypertension Mother   . Heart attack Father   . Sudden death Father   . Hypertension Sister   . Diabetes Sister     Social History Social History   Tobacco Use  . Smoking status: Former Smoker    Quit date: 08/28/2006    Years since quitting: 13.4  . Smokeless tobacco: Never Used  Substance Use Topics  . Alcohol use: No    Comment: quit drinking in the 70's  . Drug use: No     Allergies   Doxycycline and Nsaids   Review of Systems Review of Systems  Constitutional: Negative.   HENT: Negative.   Respiratory: Positive for cough.   Cardiovascular: Negative.   Gastrointestinal: Negative.   Neurological: Negative.   All other systems reviewed and are negative.    Physical Exam Triage Vital Signs ED Triage Vitals  Enc Vitals Group     BP 01/30/20 1247 (!) 144/72     Pulse Rate 01/30/20 1247 64     Resp 01/30/20 1247 17     Temp 01/30/20 1247 98.2 F (36.8 C)     Temp Source 01/30/20 1247 Oral     SpO2 01/30/20 1247 94 %     Weight 01/30/20 1246 150 lb (68 kg)     Height 01/30/20 1246 5\' 5"  (1.651 m)     Head Circumference --      Peak Flow --      Pain Score 01/30/20 1245 0     Pain Loc --      Pain Edu? --      Excl. in [place]? --    No data found.  Updated Vital Signs BP (!) 144/72 (BP Location: Right Arm)   Pulse 64   Temp 98.2 F (36.8 C) (Oral)   Resp 17   Ht 5\' 5"  (1.651 m)   Wt 150 lb (68 kg)   SpO2 94%   BMI 24.96 kg/m   Visual Acuity Right  Eye Distance:   Left Eye Distance:   Bilateral Distance:    Right Eye Near:   Left Eye Near:    Bilateral Near:     Physical Exam Vitals and nursing note reviewed.  Constitutional:      General: He is not in acute distress.  Appearance: Normal appearance. He is normal weight. He is not ill-appearing, toxic-appearing or diaphoretic.  HENT:     Head: Normocephalic.     Right Ear: Tympanic membrane, ear canal and external ear normal. There is no impacted cerumen.     Left Ear: Tympanic membrane, ear canal and external ear normal. There is no impacted cerumen.     Nose: Nose normal. No congestion.     Mouth/Throat:     Mouth: Mucous membranes are moist.     Pharynx: Oropharynx is clear. No oropharyngeal exudate or posterior oropharyngeal erythema.  Cardiovascular:     Rate and Rhythm: Normal rate and regular rhythm.     Pulses: Normal pulses.     Heart sounds: Normal heart sounds. No murmur. No friction rub. No gallop.   Pulmonary:     Effort: Pulmonary effort is normal. No respiratory distress.     Breath sounds: Normal breath sounds. No stridor. No wheezing, rhonchi or rales.  Chest:     Chest wall: No tenderness.  Abdominal:     Palpations: There is no mass.  Neurological:     Mental Status: He is alert and oriented to person, place, and time.      UC Treatments / Results  Labs (all labs ordered are listed, but only abnormal results are displayed) Labs Reviewed  POCT URINALYSIS DIP (MANUAL ENTRY)    EKG   Radiology No results found.  Procedures Procedures (including critical care time)  Medications Ordered in UC Medications - No data to display  Initial Impression / Assessment and Plan / UC Course  I have reviewed the triage vital signs and the nursing notes.  Pertinent labs & imaging results that were available during my care of the patient were reviewed by me and considered in my medical decision making (see chart for details).    Patient is stable at  discharge.  Symptoms likely from bronchitis.  Will prescribe Augmentin instead of azithromycin due to interaction.  Was advised for PCP.  Final Clinical Impressions(s) / UC Diagnoses   Final diagnoses:  Acute bronchitis due to other specified organisms     Discharge Instructions     Take medication as prescribed and to completion Follow-up with PCP Return or go to ED for worsening of symptoms    ED Prescriptions    Medication Sig Dispense Auth. Provider   predniSONE (DELTASONE) 10 MG tablet Take 2 tablets (20 mg total) by mouth daily for 5 days. 10 tablet Jahmad Petrich, Ian Hillock, FNP   benzonatate (TESSALON) 100 MG capsule Take 1 capsule (100 mg total) by mouth every 8 (eight) hours. 30 capsule Jaquia Benedicto S, FNP   amoxicillin-clavulanate (AUGMENTIN) 875-125 MG tablet Take 1 tablet by mouth every 12 (twelve) hours. 14 tablet Millenia Waldvogel, Ian Hillock, FNP     PDMP not reviewed this encounter.   Emerson Monte, FNP 01/30/20 1336

## 2020-01-30 NOTE — ED Triage Notes (Signed)
Started out Wednesday night with a dry cough that has since turned into a productive cough.  Also reports frequent urination last night.  Does not want to be tested for covid.

## 2020-02-10 ENCOUNTER — Other Ambulatory Visit (HOSPITAL_COMMUNITY): Payer: Self-pay | Admitting: *Deleted

## 2020-02-10 MED ORDER — AMLODIPINE BESYLATE 5 MG PO TABS: 5.0000 mg | ORAL_TABLET | Freq: Every day | ORAL | 6 refills | Status: DC

## 2020-02-29 ENCOUNTER — Other Ambulatory Visit (HOSPITAL_COMMUNITY): Payer: Self-pay | Admitting: Nurse Practitioner

## 2020-02-29 ENCOUNTER — Other Ambulatory Visit: Payer: Self-pay | Admitting: Family Medicine

## 2020-02-29 DIAGNOSIS — I10 Essential (primary) hypertension: Secondary | ICD-10-CM

## 2020-02-29 DIAGNOSIS — I4892 Unspecified atrial flutter: Secondary | ICD-10-CM

## 2020-03-10 ENCOUNTER — Encounter (HOSPITAL_COMMUNITY): Payer: Self-pay | Admitting: Nurse Practitioner

## 2020-03-10 ENCOUNTER — Other Ambulatory Visit: Payer: Self-pay

## 2020-03-10 ENCOUNTER — Ambulatory Visit (HOSPITAL_COMMUNITY)
Admission: RE | Admit: 2020-03-10 | Discharge: 2020-03-10 | Disposition: A | Discharge status: Home / Self Care | Payer: Medicare Other | Source: Ambulatory Visit | Attending: Nurse Practitioner | Admitting: Nurse Practitioner

## 2020-03-10 VITALS — BP 166/86 | HR 48 | Ht 65.0 in | Wt 145.8 lb

## 2020-03-10 DIAGNOSIS — F419 Anxiety disorder, unspecified: Secondary | ICD-10-CM | POA: Insufficient documentation

## 2020-03-10 DIAGNOSIS — I480 Paroxysmal atrial fibrillation: Secondary | ICD-10-CM | POA: Insufficient documentation

## 2020-03-10 DIAGNOSIS — Z87891 Personal history of nicotine dependence: Secondary | ICD-10-CM | POA: Insufficient documentation

## 2020-03-10 DIAGNOSIS — Z8249 Family history of ischemic heart disease and other diseases of the circulatory system: Secondary | ICD-10-CM | POA: Insufficient documentation

## 2020-03-10 DIAGNOSIS — I2510 Atherosclerotic heart disease of native coronary artery without angina pectoris: Secondary | ICD-10-CM | POA: Diagnosis not present

## 2020-03-10 DIAGNOSIS — Z7901 Long term (current) use of anticoagulants: Secondary | ICD-10-CM | POA: Insufficient documentation

## 2020-03-10 DIAGNOSIS — Z79899 Other long term (current) drug therapy: Secondary | ICD-10-CM | POA: Diagnosis not present

## 2020-03-10 DIAGNOSIS — Z951 Presence of aortocoronary bypass graft: Secondary | ICD-10-CM | POA: Diagnosis not present

## 2020-03-10 DIAGNOSIS — D6869 Other thrombophilia: Secondary | ICD-10-CM | POA: Diagnosis not present

## 2020-03-10 DIAGNOSIS — I10 Essential (primary) hypertension: Secondary | ICD-10-CM | POA: Diagnosis not present

## 2020-03-10 LAB — BASIC METABOLIC PANEL
Anion gap: 11 (ref 5–15)
BUN: 18 mg/dL (ref 8–23)
CO2: 21 mmol/L — ABNORMAL LOW (ref 22–32)
Calcium: 9.2 mg/dL (ref 8.9–10.3)
Chloride: 108 mmol/L (ref 98–111)
Creatinine, Ser: 1.25 mg/dL — ABNORMAL HIGH (ref 0.61–1.24)
GFR calc Af Amer: >60 mL/min (ref >60)
GFR calc non Af Amer: 56 mL/min — ABNORMAL LOW (ref >60)
Glucose, Bld: 88 mg/dL (ref 70–99)
Potassium: 4.6 mmol/L (ref 3.5–5.1)
Sodium: 140 mmol/L (ref 135–145)

## 2020-03-10 LAB — MAGNESIUM: Magnesium: 2.4 mg/dL (ref 1.7–2.4)

## 2020-03-10 LAB — TSH: TSH: 1.558 u[IU]/mL (ref 0.350–4.500)

## 2020-03-10 NOTE — Progress Notes (Addendum)
Patient ID: [name], male   DOB: Jul 17, 1946, 74 y.o.   MRN: [id number]     Primary Care Physician: Mikey Kirschner, MD Referring Physician:Dr. Tj Kitchings is a 74 y.o. male with a h/o PAF that underwent tikosyn loading at [facility] 01/10/16 . He has held in [place] very nicely . His grandson dies last week and after the service did noticed some fast irregular heart besat but only lasted around one hour.   No change form his usual health. 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 Tries to stay hydrated. Has gotten both covid shots.   BP elevated today but at home systolic is around 657-846 systolic. Just took meds about an hour ago. Heart rate is in the upper 40's today. Not symptomatic with this. He is not on AV nodal drugs. He sees HR 's in the 60's at home.     Today, he denies symptoms of palpitations, chest pain, shortness of breath, orthopnea, PND, lower extremity edema, dizziness, presyncope, syncope, or neurologic sequela. The patient is tolerating medications without difficulties and is otherwise without complaint today.   Past Medical History:  Diagnosis Date  . Anxiety   . Atrial fibrillation ([place])   . Bilateral carotid artery disease ([place]) 07/21/2013  . CAD (coronary artery disease) 2008   CABG  . COPD (chronic obstructive pulmonary disease) ([place])   . Dysrhythmia    A-fib  . Gastritis   . Headache(784.0)   . Hypertension   . Visit for monitoring Tikosyn therapy 12/2015   Past Surgical History:  Procedure Laterality Date  . CARDIOVERSION N/A 09/08/2015   Procedure: CARDIOVERSION;  Surgeon: Thayer Headings, MD;  Location: [place];  Service: Cardiovascular;  Laterality: N/A;  . CORONARY ARTERY BYPASS GRAFT    . EXCISION OF KELOID Left 10/08/2018   Procedure: EXCISION OF CHRONIC ABDOMINAL WALL WOUND;  Surgeon: Coralie Keens, MD;  Location: [place];  Service: General;  Laterality: Left;  . INCISION AND  DRAINAGE DEEP NECK ABSCESS    . ROTATOR CUFF REPAIR Bilateral   . TEE WITHOUT CARDIOVERSION N/A 08/02/2015   Procedure: TRANSESOPHAGEAL ECHOCARDIOGRAM (TEE);  Surgeon: Sueanne Margarita, MD;  Location: [facility]OSCOPY;  Service: Cardiovascular;  Laterality: N/A;  . TEE WITHOUT CARDIOVERSION N/A 09/08/2015   Procedure: TRANSESOPHAGEAL ECHOCARDIOGRAM (TEE);  Surgeon: Thayer Headings, MD;  Location: [facility] ENDOSCOPY;  Service: Cardiovascular;  Laterality: N/A;    Current Outpatient Medications  Medication Sig Dispense Refill  . ALPRAZolam (XANAX) 0.25 MG tablet TAKE 1/2 TO 1 (ONE-HALF TO ONE) TABLET BY MOUTH TWICE DAILY AS NEEDED (Patient taking differently: Take 0.25 mg by mouth daily. ) 30 tablet 5  . amLODipine (NORVASC) 5 MG tablet Take 1 tablet (5 mg total) by mouth daily. 30 tablet 6  . atorvastatin (LIPITOR) 40 MG tablet TAKE 1 TABLET(40 MG) BY MOUTH DAILY 90 tablet 1  . diltiazem (CARDIZEM) 30 MG tablet Take 1 tablet every 4 hours AS NEEDED for heart rate >100 as long as blood pressure >100. 45 tablet 1  . dofetilide (TIKOSYN) 500 MCG capsule Take 1 capsule (500 mcg total) by mouth 2 (two) times daily. 180 capsule 1  . lisinopril (ZESTRIL) 20 MG tablet Take 1 tablet (20 mg total) by mouth daily. 90 tablet 1  . Multiple Vitamins-Minerals (MULTIVITAMIN WITH MINERALS) tablet Take 1 tablet by mouth at bedtime.     . Omega-3 Fatty Acids (FISH OIL) 1200  MG CAPS Take 1,200 mg by mouth at bedtime.     . potassium chloride (KLOR-CON) 10 MEQ tablet TAKE 1 TABLET(10 MEQ) BY MOUTH DAILY 90 tablet 1  . vitamin C (ASCORBIC ACID) 500 MG tablet Take 500 mg by mouth at bedtime.     [name] 20 MG TABS tablet TAKE 1 TABLET BY MOUTH EVERY DAY WITH SUPPER 90 tablet 3   No current facility-administered medications for this encounter.    Allergies  Allergen Reactions  . Doxycycline Shortness Of Breath    Also has to urinate more often  . Nsaids Hypertension    Gastritis     Social History   Socioeconomic  History  . Marital status: Married    Spouse name: Not on file  . Number of children: Not on file  . Years of education: Not on file  . Highest education level: Not on file  Occupational History  . Not on file  Tobacco Use  . Smoking status: Former Smoker    Quit date: 08/28/2006    Years since quitting: 13.5  . Smokeless tobacco: Never Used  Substance and Sexual Activity  . Alcohol use: No    Comment: quit drinking in the 70's  . Drug use: No  . Sexual activity: Not on file  Other Topics Concern  . Not on file  Social History Narrative  . Not on file   Social Determinants of Health   Financial Resource Strain:   . Difficulty of Paying Living Expenses:   Food Insecurity:   . Worried About Charity fundraiser in the Last Year:   . Arboriculturist in the Last Year:   Transportation Needs:   . Film/video editor (Medical):   [name] Lack of Transportation (Non-Medical):   Physical Activity:   . Days of Exercise per Week:   . Minutes of Exercise per Session:   Stress:   . Feeling of Stress :   Social Connections:   . Frequency of Communication with Friends and Family:   . Frequency of Social Gatherings with Friends and Family:   . Attends Religious Services:   . Active Member of Clubs or Organizations:   . Attends Archivist Meetings:   [name] Marital Status:   Intimate Partner Violence:   . Fear of Current or Ex-Partner:   . Emotionally Abused:   [name] Physically Abused:   . Sexually Abused:     Family History  Problem Relation Age of Onset  . Diabetes Mother   . Hypertension Mother   . Heart attack Father   . Sudden death Father   . Hypertension Sister   . Diabetes Sister     ROS- All systems are reviewed and negative except as per the HPI above  Physical Exam: Vitals:   03/10/20 1007  BP: (!) 166/86  Pulse: (!) 48  Weight: 66.1 kg  Height: 5\' 5"  (1.651 m)    GEN- The patient is well appearing, alert and oriented x 3 today.   Head- normocephalic,  atraumatic Eyes-  Sclera clear, conjunctiva pink Ears- hearing intact Oropharynx- clear Neck- supple, no JVP Lymph- no cervical lymphadenopathy Lungs- Clear to ausculation bilaterally, normal work of breathing Heart- Regular rate and rhythm, no murmurs, rubs or gallops, PMI not laterally displaced GI- soft, NT, ND, + BS Extremities- no clubbing, cyanosis, or edema MS- no significant deformity or atrophy Skin- no rash or lesion Psych- euthymic mood, full affect Neuro- strength and sensation are intact  EKG-afib at 48 bpm, qrs int 86 ms, qtc 448 ms Epic records reviewed  Assessment and Plan: 1. Paroxysmal atrial fibrillation Has been staying in [place] very nicely   Continue tikosyn 500 mg bid Will add cardizem 30 mg q 4 hours for HR over 494, BP systolic over 496 He will monitor over the next few days and let us know if he does not convert Continue xarelto 20 mg a day for chadsvasc score of at least 3 Bmet/mag today  2. H/o Carotid artery disease  Per Dr. Marlou Porch No anginal symptoms  3. HTN Stable with readings at home  F/u in 6 months    Butch Penny C. Codie Hainer, [facility] [address] [place], [place] [address] [phone]

## 2020-04-09 ENCOUNTER — Other Ambulatory Visit (HOSPITAL_COMMUNITY): Payer: Self-pay | Admitting: Nurse Practitioner

## 2020-04-28 ENCOUNTER — Other Ambulatory Visit: Payer: Self-pay | Admitting: Family Medicine

## 2020-04-29 ENCOUNTER — Other Ambulatory Visit: Payer: Self-pay | Admitting: Family Medicine

## 2020-04-30 NOTE — Telephone Encounter (Signed)
Wellness on 01/20/20

## 2020-04-30 NOTE — Telephone Encounter (Signed)
Pt needs appt for more refiils on xanax. Give small amt. Needs appt in 20-30 says. Thx. Dr. Darene Lamer

## 2020-04-30 NOTE — Telephone Encounter (Signed)
Please schedule appt

## 2020-05-04 NOTE — Telephone Encounter (Signed)
lvm to schedule appt.  

## 2020-05-04 NOTE — Telephone Encounter (Signed)
Scheduled 10/7

## 2020-05-20 ENCOUNTER — Ambulatory Visit: Payer: Self-pay

## 2020-05-20 ENCOUNTER — Ambulatory Visit (INDEPENDENT_AMBULATORY_CARE_PROVIDER_SITE_OTHER): Payer: Medicare Other | Admitting: Orthopaedic Surgery

## 2020-05-20 DIAGNOSIS — M25551 Pain in right hip: Secondary | ICD-10-CM | POA: Diagnosis not present

## 2020-05-20 DIAGNOSIS — M5431 Sciatica, right side: Secondary | ICD-10-CM

## 2020-05-20 MED ORDER — GABAPENTIN 300 MG PO CAPS
300.0000 mg | ORAL_CAPSULE | Freq: Every day | ORAL | 1 refills | Status: DC
Start: 2020-05-20 — End: 2023-02-09

## 2020-05-20 MED ORDER — METHYLPREDNISOLONE 4 MG PO TABS: ORAL_TABLET | ORAL | 0 refills | Status: DC

## 2020-05-20 NOTE — Progress Notes (Signed)
Office Visit Note   Patient: [name]           Date of Birth: 1945/12/17           MRN: [id number] Visit Date: 05/20/2020              Requested by: Erven Colla, DO [place],  [place] [id number] PCP: Erven Colla, DO   Assessment & Plan: Visit Diagnoses:  1. Sciatica, right side   2. Pain in right hip     Plan: At this point I would like to start him on a 6-day steroid taper and have him take Neurontin 300 mg at night.  I do feel it is medically necessary to obtain an MRI of the lumbar spine to better ascertain the degree of stenosis and nerve compression that is causing the sciatica.  He agrees with this treatment plan.  We will see him back in 2 weeks to hopefully go over an MRI of his lumbar spine.  All questions and concerns were answered and addressed.  I sent in medication for him.  Follow-Up Instructions: Return in about 2 weeks (around 06/03/2020).   Orders:  Orders Placed This Encounter  Procedures  . XR HIP UNILAT W OR W/O PELVIS 1V RIGHT  . XR Lumbar Spine 2-3 Views   Meds ordered this encounter  Medications  . methylPREDNISolone (MEDROL) 4 MG tablet    Sig: Medrol dose pack. Take as instructed    Dispense:  21 tablet    Refill:  0  . gabapentin (NEURONTIN) 300 MG capsule    Sig: Take 1 capsule (300 mg total) by mouth at bedtime.    Dispense:  30 capsule    Refill:  1      Procedures: No procedures performed   Clinical Data: No additional findings.   Subjective: Chief Complaint  Patient presents with  . Right Hip - Pain  Patient is a very pleasant 74 year old thin individual I am seeing for the first time.  He comes in with a history of right hip and back pain to the right side that has been worsening for about 4 to 6 weeks now.  Is very painful at night and wakes him up at night.  He denies any groin pain.  He does have complaint of numbness and tingling down that leg and into his right foot.  He is not a diabetic.  He cannot  take anti-inflammatories since he is on Xarelto.  He has had no other acute changes in medical status.  HPI  Review of Systems He currently denies any headache, chest pain, short of breath, fever, chills, nausea, vomiting  Objective: Vital Signs: There were no vitals taken for this visit.  Physical Exam  Ortho Exam He is alert and orient x3 and in no acute distress examination of both hips shows full and fluid range of motion and normal exam of both hips with no pain over the right trochanteric area or pain in the right groin.  Range of motion is pain-free.  He does have subjective numbness in the L5 distribution along the lateral aspect of his right foot and leg.  He has a positive leg raise to the right side.  Has pain with flexion and extension of lumbar spine. Specialty Comments:  No specialty comments available.  Imaging: XR HIP UNILAT W OR W/O PELVIS 1V RIGHT  Result Date: 05/20/2020 An AP pelvis lateral right hip shows a normal-appearing hip joint with well-maintained  space.  There is no complicating features.  The left hip also appears normal.  There is evidence of previous surgery to the ileum from bone grafting.  XR Lumbar Spine 2-3 Views  Result Date: 05/20/2020 2 views of the lumbar spine show significant degenerative changes between L4 and L5 as well as L5 and S1.    PMFS History: Patient Active Problem List   Diagnosis Date Noted  . Insomnia 02/06/2018  . Visit for monitoring Tikosyn therapy 01/10/2016  . Atrial flutter ([place]) 08/07/2015  . Bilateral carotid artery disease ([place]) 07/21/2013  . CAD (coronary artery disease) of artery bypass graft 01/26/2013  . Chronic obstructive pulmonary disease ([place]) 01/26/2013  . Essential hypertension, benign 01/26/2013  . Hyperlipidemia 01/26/2013   Past Medical History:  Diagnosis Date  . Anxiety   . Atrial fibrillation ([place])   . Bilateral carotid artery disease ([place]) 07/21/2013  . CAD (coronary artery disease) 2008    CABG  . COPD (chronic obstructive pulmonary disease) ([place])   . Dysrhythmia    A-fib  . Gastritis   . Headache(784.0)   . Hypertension   . Visit for monitoring Tikosyn therapy 12/2015    Family History  Problem Relation Age of Onset  . Diabetes Mother   . Hypertension Mother   . Heart attack Father   . Sudden death Father   . Hypertension Sister   . Diabetes Sister     Past Surgical History:  Procedure Laterality Date  . CARDIOVERSION N/A 09/08/2015   Procedure: CARDIOVERSION;  Surgeon: Thayer Headings, MD;  Location: [facility];  Service: Cardiovascular;  Laterality: N/A;  . CORONARY ARTERY BYPASS GRAFT    . EXCISION OF KELOID Left 10/08/2018   Procedure: EXCISION OF CHRONIC ABDOMINAL WALL WOUND;  Surgeon: Coralie Keens, MD;  Location: [facility];  Service: General;  Laterality: Left;  . INCISION AND DRAINAGE DEEP NECK ABSCESS    . ROTATOR CUFF REPAIR Bilateral   . TEE WITHOUT CARDIOVERSION N/A 08/02/2015   Procedure: TRANSESOPHAGEAL ECHOCARDIOGRAM (TEE);  Surgeon: Sueanne Margarita, MD;  Location: [facility] ENDOSCOPY;  Service: Cardiovascular;  Laterality: N/A;  . TEE WITHOUT CARDIOVERSION N/A 09/08/2015   Procedure: TRANSESOPHAGEAL ECHOCARDIOGRAM (TEE);  Surgeon: Thayer Headings, MD;  Location: [facility];  Service: Cardiovascular;  Laterality: N/A;   Social History   Occupational History  . Not on file  Tobacco Use  . Smoking status: Former Smoker    Quit date: 08/28/2006    Years since quitting: 13.7  . Smokeless tobacco: Never Used  Substance and Sexual Activity  . Alcohol use: No    Comment: quit drinking in the 70's  . Drug use: No  . Sexual activity: Not on file

## 2020-05-24 ENCOUNTER — Ambulatory Visit: Payer: Medicare Other | Admitting: Orthopaedic Surgery

## 2020-05-28 ENCOUNTER — Telehealth: Payer: Self-pay

## 2020-05-28 ENCOUNTER — Other Ambulatory Visit: Payer: Self-pay

## 2020-05-28 DIAGNOSIS — M4807 Spinal stenosis, lumbosacral region: Secondary | ICD-10-CM

## 2020-05-28 NOTE — Telephone Encounter (Signed)
Patient wife called in saying they have not been contacted to sch mri

## 2020-05-28 NOTE — Telephone Encounter (Signed)
Can we expedite this? It was never put in?

## 2020-05-31 NOTE — Telephone Encounter (Signed)
Order emailed to April Pait with central scheduling with [facility]. They will contact pt to schedule appt

## 2020-06-03 ENCOUNTER — Ambulatory Visit: Payer: Medicare Other | Admitting: Physician Assistant

## 2020-06-03 ENCOUNTER — Other Ambulatory Visit: Payer: Self-pay

## 2020-06-03 ENCOUNTER — Encounter: Payer: Self-pay | Admitting: Family Medicine

## 2020-06-03 ENCOUNTER — Ambulatory Visit (INDEPENDENT_AMBULATORY_CARE_PROVIDER_SITE_OTHER): Payer: Medicare Other | Admitting: Family Medicine

## 2020-06-03 ENCOUNTER — Telehealth: Payer: Self-pay | Admitting: Radiology

## 2020-06-03 VITALS — BP 132/66 | HR 66 | Temp 97.4°F | Wt 147.2 lb

## 2020-06-03 DIAGNOSIS — I25708 Atherosclerosis of coronary artery bypass graft(s), unspecified, with other forms of angina pectoris: Secondary | ICD-10-CM | POA: Diagnosis not present

## 2020-06-03 DIAGNOSIS — Z5181 Encounter for therapeutic drug level monitoring: Secondary | ICD-10-CM | POA: Diagnosis not present

## 2020-06-03 DIAGNOSIS — F419 Anxiety disorder, unspecified: Secondary | ICD-10-CM | POA: Diagnosis not present

## 2020-06-03 DIAGNOSIS — R7989 Other specified abnormal findings of blood chemistry: Secondary | ICD-10-CM | POA: Diagnosis not present

## 2020-06-03 DIAGNOSIS — Z79899 Other long term (current) drug therapy: Secondary | ICD-10-CM | POA: Diagnosis not present

## 2020-06-03 DIAGNOSIS — I4891 Unspecified atrial fibrillation: Secondary | ICD-10-CM

## 2020-06-03 DIAGNOSIS — I10 Essential (primary) hypertension: Secondary | ICD-10-CM | POA: Diagnosis not present

## 2020-06-03 MED ORDER — ALPRAZOLAM 0.25 MG PO TABS: 0.2500 mg | ORAL_TABLET | Freq: Every day | ORAL | 2 refills | Status: DC

## 2020-06-03 MED ORDER — LISINOPRIL 20 MG PO TABS: ORAL_TABLET | ORAL | 1 refills | Status: DC

## 2020-06-03 MED ORDER — ATORVASTATIN CALCIUM 40 MG PO TABS: ORAL_TABLET | ORAL | 1 refills | Status: AC

## 2020-06-03 NOTE — Telephone Encounter (Signed)
Left voicemail for patient to reschedule appointment today, visit today is not needed.  His MRI is not scheduled until 10/13 and needs a review appointment after that date.

## 2020-06-03 NOTE — Progress Notes (Signed)
Patient ID: [name], male    DOB: [birth year], 74 y.o.   MRN: [id number]   Chief Complaint  Patient presents with  . Hypertension  . Hyperlipidemia   Subjective:    HPI F/u- htn, hld, and anxiety.  htn- doing well occ up and down. occ his hr gets high, in last couple weeks taking diltiazem. 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 Seeing Dr. Jory Moyer, seeing 1x per yr. Has been carotid arteries checked due to having bruits.  Taking diltiazem when having hr over 100 and bp is at least 100. Checking it nightly to see if he needs the medications. H/o afib and used to get dizzy, and had cardioversion in past. Pt is on tikosyn and xarelto for afib.  Xanax at night for sleep.  Taking 0.25mg .  Only taking prn. If working during tobacco season.  Aug to nov. Some nights doesn't take it if working at night. No issues with memory or falls.    Medical History [name] has a past medical history of Anxiety, Atrial fibrillation ([facility]), Bilateral carotid artery disease ([place]) (07/21/2013), CAD (coronary artery disease) (2008), COPD (chronic obstructive pulmonary disease) ([place]), Dysrhythmia, Gastritis, Headache(784.0), Hypertension, and Visit for monitoring Tikosyn therapy (12/2015).   Outpatient Encounter Medications as of 06/03/2020  Medication Sig  . ALPRAZolam (XANAX) 0.25 MG tablet Take 1 tablet (0.25 mg total) by mouth daily.  [name] amLODipine (NORVASC) 5 MG tablet Take 1 tablet (5 mg total) by mouth daily.  [name] atorvastatin (LIPITOR) 40 MG tablet TAKE 1 TABLET(40 MG) BY MOUTH DAILY  . diltiazem (CARDIZEM) 30 MG tablet Take 1 tablet every 4 hours AS NEEDED for heart rate >100 as long as blood pressure >100.  [name] dofetilide (TIKOSYN) 500 MCG capsule TAKE 1 CAPSULE(500 MCG) BY MOUTH TWICE DAILY  . gabapentin (NEURONTIN) 300 MG capsule Take 1 capsule (300 mg total) by mouth at bedtime.  [name] lisinopril (ZESTRIL) 20 MG tablet TAKE 1 TABLET(20 MG) BY MOUTH DAILY  .  Multiple Vitamins-Minerals (MULTIVITAMIN WITH MINERALS) tablet Take 1 tablet by mouth at bedtime.   . Omega-3 Fatty Acids (FISH OIL) 1200 MG CAPS Take 1,200 mg by mouth at bedtime.   . potassium chloride (KLOR-CON) 10 MEQ tablet TAKE 1 TABLET(10 MEQ) BY MOUTH DAILY  . vitamin C (ASCORBIC ACID) 500 MG tablet Take 500 mg by mouth at bedtime.   [name] 20 MG TABS tablet TAKE 1 TABLET BY MOUTH EVERY DAY WITH SUPPER  . [DISCONTINUED] ALPRAZolam (XANAX) 0.25 MG tablet Take 1 tablet (0.25 mg total) by mouth daily.  . [DISCONTINUED] atorvastatin (LIPITOR) 40 MG tablet TAKE 1 TABLET(40 MG) BY MOUTH DAILY  . [DISCONTINUED] lisinopril (ZESTRIL) 20 MG tablet TAKE 1 TABLET(20 MG) BY MOUTH DAILY  . [DISCONTINUED] methylPREDNISolone (MEDROL) 4 MG tablet Medrol dose pack. Take as instructed   No facility-administered encounter medications on file as of 06/03/2020.     Review of Systems  Constitutional: Negative for chills and fever.  HENT: Negative for congestion, rhinorrhea and sore throat.   Respiratory: Negative for cough, shortness of breath and wheezing.   Cardiovascular: Negative for chest pain and leg swelling.  Gastrointestinal: Negative for abdominal pain, diarrhea, nausea and vomiting.  Genitourinary: Negative for dysuria and frequency.  Skin: Negative for rash.  Neurological: Negative for dizziness, weakness and headaches.     Vitals BP 132/66   Pulse 66   Temp (!) 97.4 F (36.3 C)   Wt 147 lb 3.2 oz (66.8  kg)   SpO2 99%   BMI 24.50 kg/m   Objective:   Physical Exam Vitals and nursing note reviewed.  Constitutional:      General: He is not in acute distress.    Appearance: Normal appearance. He is not ill-appearing.  HENT:     Head: Normocephalic.  Eyes:     Extraocular Movements: Extraocular movements intact.     Conjunctiva/sclera: Conjunctivae normal.     Pupils: Pupils are equal, round, and reactive to light.  Cardiovascular:     Rate and Rhythm: Normal rate and  regular rhythm.     Pulses: Normal pulses.     Heart sounds: Normal heart sounds. No murmur heard.   Pulmonary:     Effort: Pulmonary effort is normal. No respiratory distress.     Breath sounds: Normal breath sounds. No wheezing, rhonchi or rales.  Musculoskeletal:        General: Normal range of motion.     Right lower leg: No edema.     Left lower leg: No edema.  Skin:    General: Skin is warm and dry.     Findings: No rash.  Neurological:     General: No focal deficit present.     Mental Status: He is alert and oriented to person, place, and time.     Cranial Nerves: No cranial nerve deficit.  Psychiatric:        Mood and Affect: Mood normal.        Behavior: Behavior normal.        Thought Content: Thought content normal.        Judgment: Judgment normal.      Assessment and Plan   1. Essential hypertension, benign - lisinopril (ZESTRIL) 20 MG tablet; TAKE 1 TABLET(20 MG) BY MOUTH DAILY  Dispense: 90 tablet; Refill: 1  2. Anxiety - ALPRAZolam (XANAX) 0.25 MG tablet; Take 1 tablet (0.25 mg total) by mouth daily.  Dispense: 30 tablet; Refill: 2  3. High risk medication use - ToxASSURE Select 13 (MW), Urine  4. Visit for monitoring Tikosyn therapy - Basic metabolic panel  5. Elevated serum creatinine - Basic metabolic panel  6. Coronary artery disease involving coronary bypass graft of native heart with other forms of angina pectoris (HCC) - atorvastatin (LIPITOR) 40 MG tablet; TAKE 1 TABLET(40 MG) BY MOUTH DAILY  Dispense: 90 tablet; Refill: 1  7. Atrial fibrillation, unspecified type ([place])   Anxiety/insomnia- taking xanax prn.  Some nights not having to take it.  Not having any falls, memory issues.  Reviewed risk vs. Benefit of taking benzodiazepines in pt over 52 yrs old.  Pt aware.  Cad/afib/tikosyn tx- doing well and seeing cards.  Stable.  Elevated Cr- in 7/21, will recheck this week.  htn- stable, cont meds.  F/u 19mo or prn.

## 2020-06-04 ENCOUNTER — Telehealth: Payer: Self-pay

## 2020-06-04 LAB — BASIC METABOLIC PANEL
BUN/Creatinine Ratio: 10 (ref 10–24)
BUN: 12 mg/dL (ref 8–27)
CO2: 25 mmol/L (ref 20–29)
Calcium: 8.9 mg/dL (ref 8.6–10.2)
Chloride: 105 mmol/L (ref 96–106)
Creatinine, Ser: 1.25 mg/dL (ref 0.76–1.27)
GFR calc Af Amer: 64 mL/min/{1.73_m2} (ref >59)
GFR calc non Af Amer: 56 mL/min/{1.73_m2} — ABNORMAL LOW (ref >59)
Glucose: 85 mg/dL (ref 65–99)
Potassium: 5.1 mmol/L (ref 3.5–5.2)
Sodium: 142 mmol/L (ref 134–144)

## 2020-06-04 MED ORDER — METHYLPREDNISOLONE 4 MG PO TABS: ORAL_TABLET | ORAL | 0 refills | Status: DC

## 2020-06-04 NOTE — Telephone Encounter (Signed)
Pts wife called wanting to know if Dr.Blackman isn't able to call in the rx is there anyone else who can?  [phone]

## 2020-06-04 NOTE — Telephone Encounter (Signed)
Patient callled in for refill on 58ml methylprednisoione

## 2020-06-04 NOTE — Telephone Encounter (Signed)
Please advise. Thanks.  

## 2020-06-08 LAB — TOXASSURE SELECT 13 (MW), URINE

## 2020-06-09 ENCOUNTER — Other Ambulatory Visit: Payer: Self-pay

## 2020-06-09 ENCOUNTER — Ambulatory Visit (HOSPITAL_COMMUNITY)
Admission: RE | Admit: 2020-06-09 | Discharge: 2020-06-09 | Disposition: A | Discharge status: Home / Self Care | Payer: Medicare Other | Source: Ambulatory Visit | Attending: Orthopaedic Surgery | Admitting: Orthopaedic Surgery

## 2020-06-09 DIAGNOSIS — M4807 Spinal stenosis, lumbosacral region: Secondary | ICD-10-CM | POA: Diagnosis not present

## 2020-06-09 DIAGNOSIS — M545 Low back pain, unspecified: Secondary | ICD-10-CM | POA: Diagnosis not present

## 2020-06-11 ENCOUNTER — Other Ambulatory Visit (HOSPITAL_COMMUNITY): Payer: Self-pay | Admitting: *Deleted

## 2020-06-11 MED ORDER — DILTIAZEM HCL 30 MG PO TABS: ORAL_TABLET | ORAL | 1 refills | Status: DC

## 2020-06-14 ENCOUNTER — Ambulatory Visit (INDEPENDENT_AMBULATORY_CARE_PROVIDER_SITE_OTHER): Payer: Medicare Other | Admitting: Physician Assistant

## 2020-06-14 ENCOUNTER — Encounter: Payer: Self-pay | Admitting: Physician Assistant

## 2020-06-14 DIAGNOSIS — M4807 Spinal stenosis, lumbosacral region: Secondary | ICD-10-CM

## 2020-06-14 DIAGNOSIS — I25708 Atherosclerosis of coronary artery bypass graft(s), unspecified, with other forms of angina pectoris: Secondary | ICD-10-CM | POA: Diagnosis not present

## 2020-06-14 DIAGNOSIS — M5431 Sciatica, right side: Secondary | ICD-10-CM | POA: Diagnosis not present

## 2020-06-14 NOTE — Progress Notes (Signed)
HPI: [name] returns today to go over the MRI of his lumbar spine.  He continues to have radicular symptoms down the right leg only.  He states that the pain down the right leg is not affected by sitting standing or lying down.  He does have numbness tingling that goes down the leg into the right foot.  States that his pain down the leg is improved but still constant after taking the prednisone Dosepak.  Again he is on Xarelto and unable to take NSAIDs.  MRI lumbar spine was reviewed with the patient and images are reviewed with the patient along with a lumbar spine model was used to demonstrate the anatomy.  MRI showed L3-4 circumferential disc bulge eccentric to the right degenerative changes along with a disc protrusion resulting in severe spinal stenosis, right greater than left lateral recess stenosis and moderate right and mild left neural foraminal stenosis.  L4-5 central disc space protrusion, severe right and mild to moderate left neuroforaminal stenosis.  L5-S1 circumferential disc protrusion with mild to moderate facet hypertrophy resulting in mild lateral recess stenosis and mild to moderate right and severe left foraminal stenosis.  Incidental finding of abdominal aortic aneurysm measuring 3.0 cm.   Impression: Multilevel lumbar degenerative changes Right lower extremity lumbar radicular pain   Plan: Discussed with patient findings at length we will refer him to Dr. Ernestina Patches for epidural steroid injections.  He will follow with Dr. Ninfa Linden at 4 weeks after the injection.  In regards to the abdominal aortic aneurysm it is recommended that he have this evaluated in 3 years by ultrasound I discussed this with him at length and he will pass this on to his primary care provider who can order the study in the future.  Questions were encouraged and answered at length.

## 2020-06-14 NOTE — Addendum Note (Signed)
Addended by: Meyer Cory on: 06/14/2020 01:08 PM   Modules accepted: Orders

## 2020-06-15 ENCOUNTER — Telehealth: Payer: Self-pay | Admitting: Physical Medicine and Rehabilitation

## 2020-06-15 NOTE — Telephone Encounter (Signed)
Called pt and sch 11/9

## 2020-06-15 NOTE — Telephone Encounter (Signed)
Pt called stating the best number to reach him at to set his appt is   [phone]

## 2020-07-06 ENCOUNTER — Ambulatory Visit (INDEPENDENT_AMBULATORY_CARE_PROVIDER_SITE_OTHER): Payer: Medicare Other | Admitting: Physical Medicine and Rehabilitation

## 2020-07-06 ENCOUNTER — Ambulatory Visit: Payer: Self-pay

## 2020-07-06 ENCOUNTER — Encounter: Payer: Self-pay | Admitting: Physical Medicine and Rehabilitation

## 2020-07-06 ENCOUNTER — Other Ambulatory Visit: Payer: Self-pay

## 2020-07-06 VITALS — BP 146/72 | HR 66

## 2020-07-06 DIAGNOSIS — M48062 Spinal stenosis, lumbar region with neurogenic claudication: Secondary | ICD-10-CM

## 2020-07-06 DIAGNOSIS — M5416 Radiculopathy, lumbar region: Secondary | ICD-10-CM | POA: Diagnosis not present

## 2020-07-06 MED ORDER — DEXAMETHASONE SODIUM PHOSPHATE 10 MG/ML IJ SOLN
15.0000 mg | Freq: Once | INTRAMUSCULAR | Status: AC
Start: 2020-07-06 — End: 2020-07-06
  Administered 2020-07-06: 15 mg

## 2020-07-06 NOTE — Progress Notes (Signed)
Pt state lower back pain that travels down his right thigh. Pt state when his getting ready for bed and brushing his teeth that when the pain is bad. Pt state he has to sit down to ease the pain.  Numeric Pain Rating Scale and Functional Assessment Average Pain 4   In the last MONTH (on 0-10 scale) has pain interfered with the following?  1. General activity like being  able to carry out your everyday physical activities such as walking, climbing stairs, carrying groceries, or moving a chair?  Rating(7)   +Driver, +BT Xarelto but ok, -Dye Allergies.

## 2020-07-06 NOTE — Progress Notes (Signed)
[name] - 74 y.o. male MRN [id number]  Date of birth: [birth year]  Office Visit Note: Visit Date: 07/06/2020 PCP: Erven Colla, DO Referred by: Erven Colla, DO  Subjective: Chief Complaint  Patient presents with  . Lower Back - Pain  . Right Thigh - Pain   HPI:  [name] is a 74 y.o. male who comes in today at the request of Benita Stabile, PA-C for planned Right L3-L4 Lumbar epidural steroid injection with fluoroscopic guidance.  The patient has failed conservative care including home exercise, medications, time and activity modification.  This injection will be diagnostic and hopefully therapeutic.  Please see requesting physician notes for further details and justification.  MRI reviewed with images and spine model.  MRI reviewed in the note below.  Most of his pain is an anterior posterior lateral distribution to about the knee.  Occasional paresthesias to the calf and more of an L4 distribution.  Average pain 4 out of 10 worse with standing and ambulating.  Seems to be consistent with lumbar stenosis at L3-4 with lateral recess narrowing as well.   ROS Otherwise per HPI.  Assessment & Plan: Visit Diagnoses:  1. Lumbar radiculopathy   2. Spinal stenosis of lumbar region with neurogenic claudication     Plan: No additional findings.   Meds & Orders:  Meds ordered this encounter  Medications  . dexamethasone (DECADRON) injection 15 mg    Orders Placed This Encounter  Procedures  . XR C-ARM NO REPORT  . Epidural Steroid injection    Follow-up: Return if symptoms worsen or fail to improve.   Procedures: No procedures performed  Lumbosacral Transforaminal Epidural Steroid Injection - Sub-Pedicular Approach with Fluoroscopic Guidance  Patient: [name]      Date of Birth: [birth year] MRN: [id number] PCP: Erven Colla, DO      Visit Date: 07/06/2020   Universal Protocol:    Date/Time: 07/06/2020  Consent Given By: the patient  Position:  PRONE  Additional Comments: Vital signs were monitored before and after the procedure. Patient was prepped and draped in the usual sterile fashion. The correct patient, procedure, and site was verified.   Injection Procedure Details:   Procedure diagnoses:  1. Lumbar radiculopathy      Meds Administered:  Meds ordered this encounter  Medications  . dexamethasone (DECADRON) injection 15 mg    Laterality: Right  Location/Site:  L3-L4  Needle:5.0 in., 22 ga.  Short bevel or Quincke spinal needle  Needle Placement: Transforaminal  Findings:    -Comments: Excellent flow of contrast along the nerve, nerve root and into the epidural space.  Procedure Details: After squaring off the end-plates to get a true AP view, the C-arm was positioned so that an oblique view of the foramen as noted above was visualized. The target area is just inferior to the "nose of the scotty dog" or sub pedicular. The soft tissues overlying this structure were infiltrated with 2-3 ml. of 1% Lidocaine without Epinephrine.  The spinal needle was inserted toward the target using a "trajectory" view along the fluoroscope beam.  Under AP and lateral visualization, the needle was advanced so it did not puncture dura and was located close the 6 O'Clock position of the pedical in AP tracterory. Biplanar projections were used to confirm position. Aspiration was confirmed to be negative for CSF and/or blood. A 1-2 ml. volume of Isovue-250 was injected and flow of contrast was noted at each level. Radiographs were obtained  for documentation purposes.   After attaining the desired flow of contrast documented above, a 0.5 to 1.0 ml test dose of 0.25% Marcaine was injected into each respective transforaminal space.  The patient was observed for 90 seconds post injection.  After no sensory deficits were reported, and normal lower extremity motor function was noted,   the above injectate was administered so that equal amounts  of the injectate were placed at each foramen (level) into the transforaminal epidural space.   Additional Comments:  The patient tolerated the procedure well Dressing: 2 x 2 sterile gauze and Band-Aid    Post-procedure details: Patient was observed during the procedure. Post-procedure instructions were reviewed.  Patient left the clinic in stable condition.      Clinical History: MRI LUMBAR SPINE WITHOUT CONTRAST  TECHNIQUE: Multiplanar, multisequence MR imaging of the lumbar spine was performed. No intravenous contrast was administered.  COMPARISON:  Lumbar spine radiographs 05/20/2020  FINDINGS: Segmentation:  Standard.  Alignment: Mild lumbar levoscoliosis. Trace retrolisthesis of L5 on S1.  Vertebrae: No fracture or suspicious osseous lesion. Mild left-sided degenerative endplate edema at T4-H9.  Conus medullaris and cauda equina: Conus extends to the L1 level. Conus and cauda equina appear normal.  Paraspinal and other soft tissues: Infrarenal abdominal aortic aneurysm measuring 3.0 cm in diameter.  Disc levels:  Disc desiccation throughout the lumbar spine. Moderate disc space narrowing from L3-4 to L5-S1.  L1-2: Negative.  L2-3: Minimal disc bulging and mild facet hypertrophy without stenosis.  L3-4: Circumferential disc bulging eccentric to the right, a right subarticular and foraminal disc protrusion, prominent dorsal epidural fat, and moderate facet and ligamentum flavum hypertrophy result in severe spinal stenosis, right greater than left lateral recess stenosis, and moderate right and mild left neural foraminal stenosis. Potential right L4 nerve root impingement.  L4-5: Circumferential disc bulging, a small medial right foraminal disc extrusion, and moderate facet and ligamentum flavum hypertrophy result in moderate spinal stenosis, moderate to severe bilateral lateral recess stenosis, and severe right and mild-to-moderate  left neural foraminal stenosis. Potential right L4 and bilateral L5 nerve root impingement.  L5-S1: Circumferential disc bulging and mild-to-moderate facet hypertrophy result in mild left lateral recess stenosis and mild-to-moderate right and severe left neural foraminal stenosis with potential left L5 nerve root impingement. No significant spinal stenosis.  IMPRESSION: 1. Multilevel lumbar disc and facet degeneration. 2. Severe spinal stenosis and moderate right neural foraminal stenosis at L3-4. 3. Moderate spinal stenosis and severe right neural foraminal stenosis at L4-5. 4. Severe left neural foraminal stenosis at L5-S1. 5. 3.0 cm infrarenal abdominal aortic aneurysm. Recommend follow-up ultrasound every 3 years. This recommendation follows ACR consensus guidelines: White Paper of the ACR Incidental Findings Committee II on Vascular Findings. J Am Coll Radiol 2013; 10:789-794.   Electronically Signed   By: Logan Bores M.D.   On: 06/09/2020 17:52     Objective:  VS:  HT:    WT:   BMI:     BP:(!) 146/72  HR:66bpm  TEMP: ( )  RESP:  Physical Exam Constitutional:      General: He is not in acute distress.    Appearance: Normal appearance. He is not ill-appearing.  HENT:     Head: Normocephalic and atraumatic.     Right Ear: External ear normal.     Left Ear: External ear normal.  Eyes:     Extraocular Movements: Extraocular movements intact.  Cardiovascular:     Rate and Rhythm: Normal rate.  Pulses: Normal pulses.  Abdominal:     General: There is no distension.     Palpations: Abdomen is soft.  Musculoskeletal:        General: No tenderness or signs of injury.     Right lower leg: No edema.     Left lower leg: No edema.     Comments: Patient has good distal strength without clonus.  Skin:    Findings: No erythema or rash.  Neurological:     General: No focal deficit present.     Mental Status: He is alert and oriented to person, place, and time.      Sensory: No sensory deficit.     Motor: No weakness or abnormal muscle tone.     Coordination: Coordination normal.  Psychiatric:        Mood and Affect: Mood normal.        Behavior: Behavior normal.      Imaging: No results found.

## 2020-07-06 NOTE — Procedures (Signed)
Lumbosacral Transforaminal Epidural Steroid Injection - Sub-Pedicular Approach with Fluoroscopic Guidance  Patient: [name]      Date of Birth: [birth year] MRN: [id number] PCP: Erven Colla, DO      Visit Date: 07/06/2020   Universal Protocol:    Date/Time: 07/06/2020  Consent Given By: the patient  Position: PRONE  Additional Comments: Vital signs were monitored before and after the procedure. Patient was prepped and draped in the usual sterile fashion. The correct patient, procedure, and site was verified.   Injection Procedure Details:   Procedure diagnoses:  1. Lumbar radiculopathy      Meds Administered:  Meds ordered this encounter  Medications  . dexamethasone (DECADRON) injection 15 mg    Laterality: Right  Location/Site:  L3-L4  Needle:5.0 in., 22 ga.  Short bevel or Quincke spinal needle  Needle Placement: Transforaminal  Findings:    -Comments: Excellent flow of contrast along the nerve, nerve root and into the epidural space.  Procedure Details: After squaring off the end-plates to get a true AP view, the C-arm was positioned so that an oblique view of the foramen as noted above was visualized. The target area is just inferior to the "nose of the scotty dog" or sub pedicular. The soft tissues overlying this structure were infiltrated with 2-3 ml. of 1% Lidocaine without Epinephrine.  The spinal needle was inserted toward the target using a "trajectory" view along the fluoroscope beam.  Under AP and lateral visualization, the needle was advanced so it did not puncture dura and was located close the 6 O'Clock position of the pedical in AP tracterory. Biplanar projections were used to confirm position. Aspiration was confirmed to be negative for CSF and/or blood. A 1-2 ml. volume of Isovue-250 was injected and flow of contrast was noted at each level. Radiographs were obtained for documentation purposes.   After attaining the desired flow of contrast  documented above, a 0.5 to 1.0 ml test dose of 0.25% Marcaine was injected into each respective transforaminal space.  The patient was observed for 90 seconds post injection.  After no sensory deficits were reported, and normal lower extremity motor function was noted,   the above injectate was administered so that equal amounts of the injectate were placed at each foramen (level) into the transforaminal epidural space.   Additional Comments:  The patient tolerated the procedure well Dressing: 2 x 2 sterile gauze and Band-Aid    Post-procedure details: Patient was observed during the procedure. Post-procedure instructions were reviewed.  Patient left the clinic in stable condition.

## 2020-07-14 ENCOUNTER — Ambulatory Visit (INDEPENDENT_AMBULATORY_CARE_PROVIDER_SITE_OTHER): Payer: Medicare Other | Admitting: Physician Assistant

## 2020-07-14 ENCOUNTER — Encounter: Payer: Self-pay | Admitting: Physician Assistant

## 2020-07-14 DIAGNOSIS — M4807 Spinal stenosis, lumbosacral region: Secondary | ICD-10-CM

## 2020-07-14 NOTE — Addendum Note (Signed)
Addended by: Robyne Peers on: 07/14/2020 03:43 PM   Modules accepted: Orders

## 2020-07-14 NOTE — Progress Notes (Signed)
HPI: Mr. [name] returns today after epidural steroid injection lumbar spine with Dr. Ernestina Patches.  He states the injection only helped for couple of days and his pain is returned.  He states his pain is unchanged from prior to the injection he is still having pain down the right leg occasionally radiates down into the right foot with some numbness tingling.  Given his severe stenosis at L3-4 and the fact that he is failed all conservative treatment will refer to neurosurgery.  Questions were encouraged and answered patient today.  Follow-up as needed with our office.  No charge for today's office visit.

## 2020-07-27 ENCOUNTER — Other Ambulatory Visit: Payer: Self-pay | Admitting: Neurosurgery

## 2020-07-27 DIAGNOSIS — I10 Essential (primary) hypertension: Secondary | ICD-10-CM | POA: Diagnosis not present

## 2020-07-27 DIAGNOSIS — M4807 Spinal stenosis, lumbosacral region: Secondary | ICD-10-CM | POA: Diagnosis not present

## 2020-07-27 DIAGNOSIS — M5126 Other intervertebral disc displacement, lumbar region: Secondary | ICD-10-CM | POA: Diagnosis not present

## 2020-07-28 ENCOUNTER — Telehealth: Payer: Self-pay | Admitting: *Deleted

## 2020-07-28 NOTE — Telephone Encounter (Signed)
   [facility] Pre-operative Risk Assessment    HEARTCARE STAFF: - Please ensure there is not already an duplicate clearance open for this procedure. - Under Visit Info/Reason for Call, type in Other and utilize the format Clearance MM/DD/YY or Clearance TBD. Do not use dashes or single digits. - If request is for dental extraction, please clarify the # of teeth to be extracted.  Request for surgical clearance:  1. What type of surgery is being performed? L3-4, L4-5 LUMBAR LAMINECTOMY   2. When is this surgery scheduled? 08/25/20   3. What type of clearance is required (medical clearance vs. Pharmacy clearance to hold med vs. Both)? BOTH  4. Are there any medications that need to be held prior to surgery and how long? [place]   5. Practice name and name of physician performing surgery? [place]; DR. GARY CRAM   6. What is the office phone number? [phone]   7.   What is the office fax number? [phone]  8.   Anesthesia type (None, local, MAC, general) ? GENERAL   Julaine Hua 07/28/2020, 11:24 AM  _________________________________________________________________   (provider comments below)

## 2020-07-28 NOTE — Telephone Encounter (Signed)
Patient with diagnosis of PAF on Xarelto for anticoagulation.    Procedure: L3-4, L4-5 LUMBAR LAMINECTOMY  Date of procedure: 08/25/20  CHA2DS2-VASc Score = 4  This indicates a 4.8% annual risk of stroke. The patient's score is based upon: CHF History: 0 HTN History: 1 Diabetes History: 0 Stroke History: 0 Vascular Disease History: 1 Age Score: 2 Gender Score: 0    CrCl 47 mL/min Platelet count 231K  Per office protocol, patient can hold Xarelto for 3 days prior to procedure.    Patient will not need bridging with Lovenox (enoxaparin) around procedure.  Note: Patient is not on correct dose of Xarelto per renal function.  Patient dose should be 15 mg once daily.

## 2020-07-29 NOTE — Telephone Encounter (Signed)
Dr. Marlou Porch, This request is in the setting of preop clearance. Pt has been cleared to hold xarelto. However, Gerald Stabs has alerted [place] that he is not on the correct dose of xarelto for his renal function. Would you like to change this?  I have not completed medical clearance yet, so we can advise him if there needs to be a dose change.  Thanks Angie

## 2020-07-29 NOTE — Telephone Encounter (Signed)
Agree with changing dose of Xarelto for his renal function. Candee Furbish, MD

## 2020-07-30 ENCOUNTER — Other Ambulatory Visit: Payer: Self-pay | Admitting: Physician Assistant

## 2020-07-30 DIAGNOSIS — I480 Paroxysmal atrial fibrillation: Secondary | ICD-10-CM

## 2020-07-30 MED ORDER — RIVAROXABAN 15 MG PO TABS: 15.0000 mg | ORAL_TABLET | Freq: Every day | ORAL | 3 refills | Status: DC

## 2020-07-30 NOTE — Telephone Encounter (Signed)
Left message with patient regarding cardiac evaluation and change in his Xarelto dosing.  Instructed him that we would be reducing his Xarelto dose to 15 mg daily.  He will need call back.  Left message [phone] 07/30/2020

## 2020-08-04 NOTE — Telephone Encounter (Signed)
   Primary Cardiologist: Candee Furbish, MD  Chart reviewed as part of pre-operative protocol coverage. Patient was last seen by [name], NP in the [facility] in 02/2020, at which time he was doing well from a cardiac standpoint. Patient was contacted today for further pre-op evaluation and reported doing well since last visit. No chest pain, shortness of breath, orthopnea, PND, edema, palpitations, lightheadedness, dizziness, or syncope. He stays very active and is easily able to complete >4.0 METS without any problems. Given past medical history and time since last visit, based on ACC/AHA guidelines, [name] would be at acceptable risk for the planned procedure without further cardiovascular testing.   Per Pharmacy and office protocol, Xarelto can be held for 3 days prior to procedure. This should be restarted as soon as possible postoperatively.  Of note, Xarelto has been reduced to 15mg  daily due to renal function. Patient is aware of dose change.  I will route this recommendation to the requesting party via Epic fax function and remove from pre-op pool.  Please call with questions.  Darreld Mclean, PA-C 08/04/2020, 2:00 PM

## 2020-08-05 NOTE — Progress Notes (Signed)
Your procedure is scheduled on Monday, December 13th.  Report to [facility] Main Entrance "A" at 11:25 A.M., and check in at the Admitting office.  Call this number if you have problems the morning of surgery:  [phone]  Call [phone] if you have any questions prior to your surgery date Monday-Friday 8am-4pm   Remember:  Do not eat or drink after midnight the night before your surgery     Take these medicines the morning of surgery with A SIP OF WATER  ALPRAZolam (XANAX) dofetilide (TIKOSYN)   If needed: amLODipine (NORVASC), diltiazem (CARDIZEM),  gabapentin (NEURONTIN)   Follow your surgeon's instructions on when to stop rivaroxaban (XARELTO).  If no instructions were given by your surgeon then you will need to call the office to get those instructions.    As of today, STOP taking any Aspirin (unless otherwise instructed by your surgeon) Aleve, Naproxen, Ibuprofen, Motrin, Advil, Goody's, BC's, all herbal medications, fish oil, and all vitamins.                     Do not wear jewelry            Do not wear lotions, powders, colognes, or deodorant.            Men may shave face and neck.            Do not bring valuables to the hospital.            [facility] is not responsible for any belongings or valuables.  Do NOT Smoke (Tobacco/Vaping) or drink Alcohol 24 hours prior to your procedure If you use a CPAP at night, you may bring all equipment for your overnight stay.   Contacts, glasses, dentures or bridgework may not be worn into surgery.      For patients admitted to the hospital, discharge time will be determined by your treatment team.   Patients discharged the day of surgery will not be allowed to drive home, and someone needs to stay with them for 24 hours.  Special instructions:   [facility]- Preparing For Surgery  Before surgery, you can play an important role. Because skin is not sterile, your skin needs to be as free of germs as possible. You can reduce  the number of germs on your skin by washing with CHG (chlorahexidine gluconate) Soap before surgery.  CHG is an antiseptic cleaner which kills germs and bonds with the skin to continue killing germs even after washing.    Oral Hygiene is also important to reduce your risk of infection.  Remember - BRUSH YOUR TEETH THE MORNING OF SURGERY WITH YOUR REGULAR TOOTHPASTE  Please do not use if you have an allergy to CHG or antibacterial soaps. If your skin becomes reddened/irritated stop using the CHG.  Do not shave (including legs and underarms) for at least 48 hours prior to first CHG shower. It is OK to shave your face.  Please follow these instructions carefully.   1. Shower the NIGHT BEFORE SURGERY and the MORNING OF SURGERY with CHG Soap.   2. If you chose to wash your hair, wash your hair first as usual with your normal shampoo.  3. After you shampoo, rinse your hair and body thoroughly to remove the shampoo.  4. Use CHG as you would any other liquid soap. You can apply CHG directly to the skin and wash gently with a scrungie or a clean washcloth.   5. Apply the CHG Soap  to your body ONLY FROM THE NECK DOWN.  Do not use on open wounds or open sores. Avoid contact with your eyes, ears, mouth and genitals (private parts). Wash Face and genitals (private parts)  with your normal soap.   6. Wash thoroughly, paying special attention to the area where your surgery will be performed.  7. Thoroughly rinse your body with warm water from the neck down.  8. DO NOT shower/wash with your normal soap after using and rinsing off the CHG Soap.  9. Pat yourself dry with a CLEAN TOWEL.  10. Wear CLEAN PAJAMAS to bed the night before surgery  11. Place CLEAN SHEETS on your bed the night of your first shower and DO NOT SLEEP WITH PETS.  Day of Surgery: Wear Clean/Comfortable clothing the morning of surgery Do not apply any deodorants/lotions.   Remember to brush your teeth WITH YOUR REGULAR  TOOTHPASTE.   Please read over the following fact sheets that you were given.

## 2020-08-06 ENCOUNTER — Encounter (HOSPITAL_COMMUNITY)
Admission: RE | Admit: 2020-08-06 | Discharge: 2020-08-06 | Disposition: A | Discharge status: Home / Self Care | Payer: Medicare Other | Source: Ambulatory Visit | Attending: Neurosurgery | Admitting: Neurosurgery

## 2020-08-06 ENCOUNTER — Other Ambulatory Visit: Payer: Self-pay

## 2020-08-06 ENCOUNTER — Encounter (HOSPITAL_COMMUNITY): Payer: Self-pay

## 2020-08-06 ENCOUNTER — Encounter (HOSPITAL_COMMUNITY): Payer: Medicare Other

## 2020-08-06 ENCOUNTER — Other Ambulatory Visit (HOSPITAL_COMMUNITY)
Admission: RE | Admit: 2020-08-06 | Discharge: 2020-08-06 | Disposition: A | Discharge status: Home / Self Care | Payer: Medicare Other | Source: Ambulatory Visit | Attending: Neurosurgery | Admitting: Neurosurgery

## 2020-08-06 DIAGNOSIS — I10 Essential (primary) hypertension: Secondary | ICD-10-CM | POA: Insufficient documentation

## 2020-08-06 DIAGNOSIS — Z87891 Personal history of nicotine dependence: Secondary | ICD-10-CM | POA: Diagnosis not present

## 2020-08-06 DIAGNOSIS — I2510 Atherosclerotic heart disease of native coronary artery without angina pectoris: Secondary | ICD-10-CM | POA: Diagnosis not present

## 2020-08-06 DIAGNOSIS — Z79899 Other long term (current) drug therapy: Secondary | ICD-10-CM | POA: Diagnosis not present

## 2020-08-06 DIAGNOSIS — M48061 Spinal stenosis, lumbar region without neurogenic claudication: Secondary | ICD-10-CM | POA: Insufficient documentation

## 2020-08-06 DIAGNOSIS — Z951 Presence of aortocoronary bypass graft: Secondary | ICD-10-CM | POA: Insufficient documentation

## 2020-08-06 DIAGNOSIS — J449 Chronic obstructive pulmonary disease, unspecified: Secondary | ICD-10-CM | POA: Insufficient documentation

## 2020-08-06 DIAGNOSIS — Z01812 Encounter for preprocedural laboratory examination: Secondary | ICD-10-CM | POA: Insufficient documentation

## 2020-08-06 DIAGNOSIS — Z20822 Contact with and (suspected) exposure to covid-19: Secondary | ICD-10-CM | POA: Insufficient documentation

## 2020-08-06 HISTORY — DX: Abdominal aortic aneurysm, without rupture: I71.4

## 2020-08-06 HISTORY — DX: Abdominal aortic aneurysm, without rupture, unspecified: I71.40

## 2020-08-06 LAB — CBC
HCT: 47.5 % (ref 39.0–52.0)
Hemoglobin: 14.6 g/dL (ref 13.0–17.0)
MCH: 31.3 pg (ref 26.0–34.0)
MCHC: 30.7 g/dL (ref 30.0–36.0)
MCV: 101.9 fL — ABNORMAL HIGH (ref 80.0–100.0)
Platelets: 219 10*3/uL (ref 150–400)
RBC: 4.66 MIL/uL (ref 4.22–5.81)
RDW: 13.7 % (ref 11.5–15.5)
WBC: 7.8 10*3/uL (ref 4.0–10.5)
nRBC: 0 % (ref 0.0–0.2)

## 2020-08-06 LAB — BASIC METABOLIC PANEL
Anion gap: 12 (ref 5–15)
BUN: 19 mg/dL (ref 8–23)
CO2: 21 mmol/L — ABNORMAL LOW (ref 22–32)
Calcium: 9.1 mg/dL (ref 8.9–10.3)
Chloride: 108 mmol/L (ref 98–111)
Creatinine, Ser: 1.07 mg/dL (ref 0.61–1.24)
GFR, Estimated: >60 mL/min (ref >60)
Glucose, Bld: 87 mg/dL (ref 70–99)
Potassium: 4.2 mmol/L (ref 3.5–5.1)
Sodium: 141 mmol/L (ref 135–145)

## 2020-08-06 LAB — SARS CORONAVIRUS 2 (TAT 6-24 HRS): SARS Coronavirus 2: NEGATIVE

## 2020-08-06 LAB — SURGICAL PCR SCREEN
MRSA, PCR: NEGATIVE
Staphylococcus aureus: NEGATIVE

## 2020-08-06 NOTE — Anesthesia Preprocedure Evaluation (Addendum)
Anesthesia Evaluation  Patient identified by MRN, date of birth, ID band Patient awake    Reviewed: Allergy & Precautions, NPO status , Patient's Chart, lab work & pertinent test results  Airway Mallampati: II  TM Distance: >3 FB Neck ROM: Full    Dental  (+) Partial Upper, Missing   Pulmonary COPD, former smoker,    Pulmonary exam normal breath sounds clear to auscultation       Cardiovascular hypertension, Pt. on medications (-) angina+ CAD, + CABG and + Peripheral Vascular Disease  + dysrhythmias Atrial Fibrillation  Rhythm:Irregular Rate:Abnormal     Neuro/Psych  Headaches, PSYCHIATRIC DISORDERS Anxiety    GI/Hepatic negative GI ROS, Neg liver ROS,   Endo/Other  negative endocrine ROS  Renal/GU negative Renal ROS     Musculoskeletal negative musculoskeletal ROS (+)   Abdominal   Peds  Hematology  (+) Blood dyscrasia (Xarelto), ,   Anesthesia Other Findings   Reproductive/Obstetrics                           Anesthesia Physical Anesthesia Plan  ASA: III  Anesthesia Plan: General   Post-op Pain Management:    Induction: Intravenous  PONV Risk Score and Plan: 2 and Dexamethasone and Ondansetron  Airway Management Planned: Oral ETT  Additional Equipment:   Intra-op Plan:   Post-operative Plan: Extubation in OR  Informed Consent: I have reviewed the patients History and Physical, chart, labs and discussed the procedure including the risks, benefits and alternatives for the proposed anesthesia with the patient or authorized representative who has indicated his/her understanding and acceptance.       Plan Discussed with: CRNA  Anesthesia Plan Comments: (PAT note written 08/06/2020 by Myra Gianotti, PA-C. )     Anesthesia Quick Evaluation

## 2020-08-06 NOTE — Progress Notes (Signed)
Anesthesia Chart Review:  Case: [id number] Date/Time: 08/09/20 1310   Procedure: Laminectomy and Foraminotomy - L3-L4 - L4-L5 - right (Right Back) - 3C   Anesthesia type: General   Pre-op diagnosis: Stenosis   Location: [facility] OR ROOM 19 / [facility]   Surgeons: Kary Kos, MD      DISCUSSION: Patient is a 74 year old male scheduled for the above procedure.  History includes former smoker (quit 08/28/06), COPD, HTN, CAD (s/p CABG x5 03/06/07: LIMA-LAD, SVG-DIAG, SVG-PDA, SVG-OM1-OM2), afib/PAF (s/p DCCV 09/08/15, recurrent aflutter 12/06/15), carotid artery disease (31-54% RICA, 0-08% LICA 67/6195), anxiety. Per general surgery notes, he sustained a traumatic injury in the 1970's which lead to a colostomy followed by colostomy reversal and hernia repair, and he developed a chronic abdominal wound ~ 08/2018, s/p excision of "stitch granuloma" 10/08/18. 3.0 cm AAA noted on 05/2020 MRI.  Preoperative cardiology input as outlined by Sande Rives, PA-C on 08/04/20: "...No chest pain, shortness of breath, orthopnea, PND, edema, palpitations, lightheadedness, dizziness, or syncope. He stays very active and is easily able to complete >4.0 METS without any problems. Given past medical history and time since last visit, based on ACC/AHA guidelines, ABDULRAHMAN BRACEY would be at acceptable risk for the planned procedure without further cardiovascular testing.   Per Pharmacy and office protocol, Xarelto can be held for 3 days prior to procedure. This should be restarted as soon as possible postoperatively.  Of note, Xarelto has been reduced to 15mg  daily due to renal function. Patient is aware of dose change."  Reported last dose was 08/05/2020.  08/06/2020 presurgical COVID-19 test in process.  Anesthesia team to evaluate on the day of surgery.   VS: BP (!) 168/79   Pulse 73   Temp (!) 36.3 C (Oral)   Resp 17   Ht 5\' 5"  (1.651 m)   Wt 67.4 kg   SpO2 97%   BMI 24.71 kg/m  BP 137/103->168/79.  PROVIDERS: Erven Colla, DO is PCP Candee Furbish, MD is primary cardiologist. Last visit 09/12/19 with visit with [name], NP at the [facility] on 03/10/20.    LABS: Labs reviewed: Acceptable for surgery. (all labs ordered are listed, but only abnormal results are displayed)  Labs Reviewed  BASIC METABOLIC PANEL - Abnormal; Notable for the following components:      Result Value   CO2 21 (*)    All other components within normal limits  CBC - Abnormal; Notable for the following components:   MCV 101.9 (*)    All other components within normal limits  SURGICAL PCR SCREEN     IMAGES: MRI L-spine 06/09/20: IMPRESSION: 1. Multilevel lumbar disc and facet degeneration. 2. Severe spinal stenosis and moderate right neural foraminal stenosis at L3-4. 3. Moderate spinal stenosis and severe right neural foraminal stenosis at L4-5. 4. Severe left neural foraminal stenosis at L5-S1. 5. 3.0 cm infrarenal abdominal aortic aneurysm. Recommend follow-up ultrasound every 3 years. This recommendation follows ACR consensus guidelines: White Paper of the ACR Incidental Findings Committee II on Vascular Findings. J Am Coll Radiol 2013; 10:789-794. - AAA follow-up recommendations discussed with patient at 06/14/20 visit with Erskine Emery, PA-C   EKG: 03/10/20 [facility]): Sinus bradycardia at 48 bpm Septal infarct , age undetermined Abnormal ECG Since previous tracing Sinus bradycardia Confirmed by [facility], [place] [phone]) on 03/10/2020 7:27:51 PM   CV: U/S Carotid 08/07/2019: - Right Carotid: Velocities in the right ICA are consistent with a 40-59% stenosis. Non-hemodynamically significant plaque <50% noted in  the CCA. - Left Carotid: Velocities in the left ICA are consistent with a 1-39% stenosis. Non-hemodynamically significant plaque <50% noted in the CCA. - Vertebrals: Bilateral vertebral arteries demonstrate antegrade flow. - Subclavians: Right subclavian artery flow was disturbed. Normal  flow hemodynamics were seen in the left subclavian artery.   Echocardiogram 12/29/2015: Study Conclusions  - Left ventricle: The cavity size was normal. Wall thickness was  normal. Systolic function was mildly reduced. The estimated  ejection fraction was in the range of 45% to 50%. Abnormal GLPSS  at -13% with inferoseptal strain abnormality. The study is not  technically sufficient to allow evaluation of LV diastolic  function.  - Mitral valve: Mildly thickened leaflets . There was trivial  regurgitation.  - Left atrium: Severely dilated at 51 ml/m2.  - Right ventricle: The cavity size was mildly dilated.  - Right atrium: The atrium was mildly dilated.  - Tricuspid valve: There was trivial regurgitation.  - Pulmonary arteries: PA peak pressure: 21 mm Hg (S).  - Inferior vena cava: The vessel was normal in size. The  respirophasic diameter changes were in the normal range (>= 50%),  consistent with normal central venous pressure.  Impressions:  - Compared to the most recent echo in 08/2015, the LVEF is slightly  improved at 45-50% [up from 35-40%]wtih inferoseptal wall motion and strain  abnormalities.    48-hour Holter monitor 08/17/2015: Atrial flutter  Rats 57 to 165 bpm  Average HR 105 bpm   Frequent PVCs , couplet.  1 7 beat run NSVT. No pauses     Last LHC was 03/05/07 prior to CABG. Reported last stress test in 2011.   Past Medical History:  Diagnosis Date  . AAA (abdominal aortic aneurysm) ([place])    06/27/20 MRI: 3.0 cm infrarenal AAA, recommend 3 year follow-up  . Anxiety   . Atrial fibrillation ([place])   . Bilateral carotid artery disease ([place]) 07/21/2013  . CAD (coronary artery disease) 2008   CABG  . COPD (chronic obstructive pulmonary disease) ([place])   . Dysrhythmia    A-fib  . Gastritis   . Headache(784.0)   . Hypertension   . Visit for monitoring Tikosyn therapy 12/2015    Past Surgical History:  Procedure Laterality Date  .  CARDIOVERSION N/A 09/08/2015   Procedure: CARDIOVERSION;  Surgeon: Thayer Headings, MD;  Location: [place];  Service: Cardiovascular;  Laterality: N/A;  . CORONARY ARTERY BYPASS GRAFT    . EXCISION OF KELOID Left 10/08/2018   Procedure: EXCISION OF CHRONIC ABDOMINAL WALL WOUND;  Surgeon: Coralie Keens, MD;  Location: [place];  Service: General;  Laterality: Left;  . INCISION AND DRAINAGE DEEP NECK ABSCESS    . ROTATOR CUFF REPAIR Bilateral   . TEE WITHOUT CARDIOVERSION N/A 08/02/2015   Procedure: TRANSESOPHAGEAL ECHOCARDIOGRAM (TEE);  Surgeon: Sueanne Margarita, MD;  Location: [facility] ENDOSCOPY;  Service: Cardiovascular;  Laterality: N/A;  . TEE WITHOUT CARDIOVERSION N/A 09/08/2015   Procedure: TRANSESOPHAGEAL ECHOCARDIOGRAM (TEE);  Surgeon: Thayer Headings, MD;  Location[facility] ENDOSCOPY;  Service: Cardiovascular;  Laterality: N/A;    MEDICATIONS: . ALPRAZolam (XANAX) 0.25 MG tablet  . amLODipine (NORVASC) 5 MG tablet  . atorvastatin (LIPITOR) 40 MG tablet  . diltiazem (CARDIZEM) 30 MG tablet  . dofetilide (TIKOSYN) 500 MCG capsule  . gabapentin (NEURONTIN) 300 MG capsule  . lisinopril (ZESTRIL) 20 MG tablet  . Multiple Vitamins-Minerals (MULTIVITAMIN WITH MINERALS) tablet  . Omega-3 Fatty Acids (FISH OIL) 1000 MG CPDR  .  potassium chloride (KLOR-CON) 10 MEQ tablet  . rivaroxaban (XARELTO) 15 MG TABS tablet  . vitamin C (ASCORBIC ACID) 500 MG tablet   No current facility-administered medications for this encounter.    Myra Gianotti, PA-C Surgical Short Stay/Anesthesiology [facility] Phone [phone] [facility] Phone [phone] 08/06/2020 1:00 PM

## 2020-08-06 NOTE — Progress Notes (Addendum)
PCP - Dr. Elvia Collum Cardiologist - Dr. Candee Furbish and [name] (Afib -Clinic)  PPM/ICD - denies  Chest x-ray - N/A EKG - 03/10/2020 Stress Test - 2011 ECHO - 12/29/15 Cardiac Cath - 2008  Sleep Study - denies  CPAP - N/A  DM: denies  Blood Thinner Instructions: XARELTO: per patient last dose taken on 08/05/2020  Aspirin Instructions: N/A  ERAS Protcol - No orders  COVID TEST- Scheduled for today 08/06/2020. Patient verbalized understanding of self-quarantine instructions, appointment time and place.  Anesthesia review: YES, cardiac hx  Patient denies shortness of breath, fever, cough and chest pain at PAT appointment  All instructions explained to the patient, with a verbal understanding of the material. Patient agrees to go over the instructions while at home for a better understanding. Patient also instructed to self quarantine after being tested for COVID-19. The opportunity to ask questions was provided.

## 2020-08-08 ENCOUNTER — Encounter (HOSPITAL_COMMUNITY): Payer: Self-pay | Admitting: Neurosurgery

## 2020-08-09 ENCOUNTER — Encounter (HOSPITAL_COMMUNITY): Payer: Self-pay | Admitting: Neurosurgery

## 2020-08-09 ENCOUNTER — Ambulatory Visit (HOSPITAL_COMMUNITY)
Admission: RE | Admit: 2020-08-09 | Discharge: 2020-08-09 | Disposition: A | Discharge status: Home / Self Care | Payer: Medicare Other | Source: Ambulatory Visit | Attending: Neurosurgery | Admitting: Neurosurgery

## 2020-08-09 ENCOUNTER — Other Ambulatory Visit: Payer: Self-pay

## 2020-08-09 ENCOUNTER — Ambulatory Visit (HOSPITAL_COMMUNITY): Payer: Medicare Other

## 2020-08-09 ENCOUNTER — Ambulatory Visit (HOSPITAL_COMMUNITY): Payer: Medicare Other | Admitting: Certified Registered Nurse Anesthetist

## 2020-08-09 ENCOUNTER — Ambulatory Visit (HOSPITAL_COMMUNITY): Payer: Medicare Other | Admitting: Vascular Surgery

## 2020-08-09 ENCOUNTER — Encounter (HOSPITAL_COMMUNITY)
Admission: RE | Disposition: A | Discharge status: Home / Self Care | Payer: Self-pay | Source: Ambulatory Visit | Attending: Neurosurgery

## 2020-08-09 DIAGNOSIS — M5116 Intervertebral disc disorders with radiculopathy, lumbar region: Secondary | ICD-10-CM | POA: Insufficient documentation

## 2020-08-09 DIAGNOSIS — Z7901 Long term (current) use of anticoagulants: Secondary | ICD-10-CM | POA: Diagnosis not present

## 2020-08-09 DIAGNOSIS — Z87891 Personal history of nicotine dependence: Secondary | ICD-10-CM | POA: Insufficient documentation

## 2020-08-09 DIAGNOSIS — Z419 Encounter for procedure for purposes other than remedying health state, unspecified: Secondary | ICD-10-CM

## 2020-08-09 DIAGNOSIS — I2510 Atherosclerotic heart disease of native coronary artery without angina pectoris: Secondary | ICD-10-CM | POA: Diagnosis not present

## 2020-08-09 DIAGNOSIS — Z951 Presence of aortocoronary bypass graft: Secondary | ICD-10-CM | POA: Diagnosis not present

## 2020-08-09 DIAGNOSIS — M48061 Spinal stenosis, lumbar region without neurogenic claudication: Secondary | ICD-10-CM | POA: Diagnosis not present

## 2020-08-09 DIAGNOSIS — I10 Essential (primary) hypertension: Secondary | ICD-10-CM | POA: Diagnosis not present

## 2020-08-09 DIAGNOSIS — Z0389 Encounter for observation for other suspected diseases and conditions ruled out: Secondary | ICD-10-CM | POA: Diagnosis not present

## 2020-08-09 DIAGNOSIS — Z79899 Other long term (current) drug therapy: Secondary | ICD-10-CM | POA: Insufficient documentation

## 2020-08-09 HISTORY — PX: LUMBAR LAMINECTOMY/DECOMPRESSION MICRODISCECTOMY: SHX5026

## 2020-08-09 SURGERY — LUMBAR LAMINECTOMY/DECOMPRESSION MICRODISCECTOMY 2 LEVELS
Anesthesia: General | Site: Back | Laterality: Right

## 2020-08-09 MED ORDER — LISINOPRIL 20 MG PO TABS
20.0000 mg | ORAL_TABLET | Freq: Every day | ORAL | Status: DC
  Administered 2020-08-09: 11:00:00 20 mg via ORAL
  Filled 2020-08-09: qty 1

## 2020-08-09 MED ORDER — THROMBIN 5000 UNITS EX SOLR
CUTANEOUS | Status: AC
  Filled 2020-08-09: qty 10000

## 2020-08-09 MED ORDER — LACTATED RINGERS IV SOLN: INTRAVENOUS | Status: DC | PRN

## 2020-08-09 MED ORDER — LIDOCAINE HCL (CARDIAC) PF 100 MG/5ML IV SOSY
PREFILLED_SYRINGE | INTRAVENOUS | Status: DC | PRN
  Administered 2020-08-09: 60 mg via INTRAVENOUS

## 2020-08-09 MED ORDER — DEXAMETHASONE SODIUM PHOSPHATE 4 MG/ML IJ SOLN
INTRAMUSCULAR | Status: DC | PRN
  Administered 2020-08-09: 10 mg via INTRAVENOUS

## 2020-08-09 MED ORDER — ROCURONIUM BROMIDE 100 MG/10ML IV SOLN
INTRAVENOUS | Status: DC | PRN
  Administered 2020-08-09: 50 mg via INTRAVENOUS

## 2020-08-09 MED ORDER — OXYCODONE HCL 5 MG PO TABS
10.0000 mg | ORAL_TABLET | ORAL | Status: DC | PRN
Start: 2020-08-09 — End: 2020-08-09
  Administered 2020-08-09: 10 mg via ORAL
  Filled 2020-08-09: qty 2

## 2020-08-09 MED ORDER — ASCORBIC ACID 500 MG PO TABS: 500.0000 mg | ORAL_TABLET | Freq: Every day | ORAL | Status: DC

## 2020-08-09 MED ORDER — ACETAMINOPHEN 500 MG PO TABS
1000.0000 mg | ORAL_TABLET | Freq: Once | ORAL | Status: AC
  Administered 2020-08-09: 06:00:00 1000 mg via ORAL
  Filled 2020-08-09: qty 2

## 2020-08-09 MED ORDER — GABAPENTIN 300 MG PO CAPS: 300.0000 mg | ORAL_CAPSULE | Freq: Every day | ORAL | Status: DC | PRN

## 2020-08-09 MED ORDER — BUPIVACAINE HCL (PF) 0.25 % IJ SOLN
INTRAMUSCULAR | Status: AC
  Filled 2020-08-09: qty 30

## 2020-08-09 MED ORDER — ONDANSETRON HCL 4 MG PO TABS: 4.0000 mg | ORAL_TABLET | Freq: Four times a day (QID) | ORAL | Status: DC | PRN

## 2020-08-09 MED ORDER — ATORVASTATIN CALCIUM 40 MG PO TABS: 40.0000 mg | ORAL_TABLET | Freq: Every day | ORAL | Status: DC

## 2020-08-09 MED ORDER — SODIUM CHLORIDE 0.9 % IV SOLN: 250.0000 mL | INTRAVENOUS | Status: DC

## 2020-08-09 MED ORDER — PROPOFOL 10 MG/ML IV BOLUS
INTRAVENOUS | Status: AC
  Filled 2020-08-09: qty 20

## 2020-08-09 MED ORDER — LACTATED RINGERS IV SOLN: INTRAVENOUS | Status: DC

## 2020-08-09 MED ORDER — 0.9 % SODIUM CHLORIDE (POUR BTL) OPTIME
TOPICAL | Status: DC | PRN
  Administered 2020-08-09: 07:00:00 1000 mL

## 2020-08-09 MED ORDER — LIDOCAINE-EPINEPHRINE 1 %-1:100000 IJ SOLN
INTRAMUSCULAR | Status: DC | PRN
  Administered 2020-08-09: 9 mL

## 2020-08-09 MED ORDER — DEXAMETHASONE SODIUM PHOSPHATE 10 MG/ML IJ SOLN
INTRAMUSCULAR | Status: AC
  Filled 2020-08-09: qty 1

## 2020-08-09 MED ORDER — ACETAMINOPHEN 325 MG PO TABS: 650.0000 mg | ORAL_TABLET | ORAL | Status: DC | PRN

## 2020-08-09 MED ORDER — HEMOSTATIC AGENTS (NO CHARGE) OPTIME
TOPICAL | Status: DC | PRN
  Administered 2020-08-09: 1 via TOPICAL

## 2020-08-09 MED ORDER — FENTANYL CITRATE (PF) 250 MCG/5ML IJ SOLN
INTRAMUSCULAR | Status: AC
  Filled 2020-08-09: qty 5

## 2020-08-09 MED ORDER — MENTHOL 3 MG MT LOZG: 1.0000 | LOZENGE | OROMUCOSAL | Status: DC | PRN

## 2020-08-09 MED ORDER — PHENOL 1.4 % MT LIQD: 1.0000 | OROMUCOSAL | Status: DC | PRN

## 2020-08-09 MED ORDER — SUGAMMADEX SODIUM 200 MG/2ML IV SOLN
INTRAVENOUS | Status: DC | PRN
  Administered 2020-08-09: 300 mg via INTRAVENOUS

## 2020-08-09 MED ORDER — AMLODIPINE BESYLATE 5 MG PO TABS: 5.0000 mg | ORAL_TABLET | Freq: Every day | ORAL | Status: DC | PRN

## 2020-08-09 MED ORDER — ONDANSETRON HCL 4 MG/2ML IJ SOLN: 4.0000 mg | Freq: Once | INTRAMUSCULAR | Status: DC | PRN

## 2020-08-09 MED ORDER — LIDOCAINE HCL (PF) 2 % IJ SOLN
INTRAMUSCULAR | Status: AC
  Filled 2020-08-09: qty 5

## 2020-08-09 MED ORDER — CHLORHEXIDINE GLUCONATE 0.12 % MT SOLN
15.0000 mL | Freq: Once | OROMUCOSAL | Status: AC
  Administered 2020-08-09: 06:00:00 15 mL via OROMUCOSAL
  Filled 2020-08-09: qty 15

## 2020-08-09 MED ORDER — FENTANYL CITRATE (PF) 100 MCG/2ML IJ SOLN
INTRAMUSCULAR | Status: DC | PRN
  Administered 2020-08-09: 100 ug via INTRAVENOUS

## 2020-08-09 MED ORDER — ADULT MULTIVITAMIN W/MINERALS CH: 1.0000 | ORAL_TABLET | Freq: Every day | ORAL | Status: DC

## 2020-08-09 MED ORDER — ONDANSETRON HCL 4 MG/2ML IJ SOLN
INTRAMUSCULAR | Status: AC
  Filled 2020-08-09: qty 2

## 2020-08-09 MED ORDER — PHENYLEPHRINE HCL-NACL 10-0.9 MG/250ML-% IV SOLN: INTRAVENOUS | Status: DC | PRN

## 2020-08-09 MED ORDER — EPHEDRINE 5 MG/ML INJ
INTRAVENOUS | Status: AC
  Filled 2020-08-09: qty 10

## 2020-08-09 MED ORDER — ALPRAZOLAM 0.25 MG PO TABS
0.2500 mg | ORAL_TABLET | Freq: Every day | ORAL | Status: DC
  Filled 2020-08-09: qty 1

## 2020-08-09 MED ORDER — CEFAZOLIN SODIUM-DEXTROSE 2-3 GM-%(50ML) IV SOLR
INTRAVENOUS | Status: DC | PRN
  Administered 2020-08-09: 2 g via INTRAVENOUS

## 2020-08-09 MED ORDER — ONDANSETRON HCL 4 MG/2ML IJ SOLN: 4.0000 mg | Freq: Four times a day (QID) | INTRAMUSCULAR | Status: DC | PRN

## 2020-08-09 MED ORDER — ACETAMINOPHEN 650 MG RE SUPP: 650.0000 mg | RECTAL | Status: DC | PRN

## 2020-08-09 MED ORDER — ALUM & MAG HYDROXIDE-SIMETH 200-200-20 MG/5ML PO SUSP: 30.0000 mL | Freq: Four times a day (QID) | ORAL | Status: DC | PRN

## 2020-08-09 MED ORDER — DOFETILIDE 500 MCG PO CAPS
500.0000 ug | ORAL_CAPSULE | Freq: Two times a day (BID) | ORAL | Status: DC
  Filled 2020-08-09: qty 1

## 2020-08-09 MED ORDER — SODIUM CHLORIDE 0.9% FLUSH: 3.0000 mL | INTRAVENOUS | Status: DC | PRN

## 2020-08-09 MED ORDER — CEFAZOLIN SODIUM-DEXTROSE 2-4 GM/100ML-% IV SOLN
2.0000 g | Freq: Three times a day (TID) | INTRAVENOUS | Status: DC
  Administered 2020-08-09: 15:00:00 2 g via INTRAVENOUS
  Filled 2020-08-09: qty 100

## 2020-08-09 MED ORDER — ORAL CARE MOUTH RINSE: 15.0000 mL | Freq: Once | OROMUCOSAL | Status: AC

## 2020-08-09 MED ORDER — HYDROMORPHONE HCL 1 MG/ML IJ SOLN: 0.5000 mg | INTRAMUSCULAR | Status: DC | PRN

## 2020-08-09 MED ORDER — FENTANYL CITRATE (PF) 100 MCG/2ML IJ SOLN: 25.0000 ug | INTRAMUSCULAR | Status: DC | PRN

## 2020-08-09 MED ORDER — LIDOCAINE-EPINEPHRINE 1 %-1:100000 IJ SOLN
INTRAMUSCULAR | Status: AC
  Filled 2020-08-09: qty 1

## 2020-08-09 MED ORDER — PANTOPRAZOLE SODIUM 40 MG IV SOLR: 40.0000 mg | Freq: Every day | INTRAVENOUS | Status: DC

## 2020-08-09 MED ORDER — CYCLOBENZAPRINE HCL 10 MG PO TABS: 10.0000 mg | ORAL_TABLET | Freq: Three times a day (TID) | ORAL | Status: DC | PRN

## 2020-08-09 MED ORDER — PROPOFOL 10 MG/ML IV BOLUS
INTRAVENOUS | Status: DC | PRN
  Administered 2020-08-09: 50 mg via INTRAVENOUS
  Administered 2020-08-09: 100 mg via INTRAVENOUS

## 2020-08-09 MED ORDER — CEFAZOLIN SODIUM-DEXTROSE 2-4 GM/100ML-% IV SOLN
INTRAVENOUS | Status: AC
  Filled 2020-08-09: qty 100

## 2020-08-09 MED ORDER — SUCCINYLCHOLINE CHLORIDE 200 MG/10ML IV SOSY
PREFILLED_SYRINGE | INTRAVENOUS | Status: AC
  Filled 2020-08-09: qty 10

## 2020-08-09 MED ORDER — OMEGA-3-ACID ETHYL ESTERS 1 G PO CAPS
1.0000 g | ORAL_CAPSULE | Freq: Every day | ORAL | Status: DC
  Administered 2020-08-09: 11:00:00 1 g via ORAL
  Filled 2020-08-09: qty 1

## 2020-08-09 MED ORDER — ROCURONIUM BROMIDE 10 MG/ML (PF) SYRINGE
PREFILLED_SYRINGE | INTRAVENOUS | Status: AC
  Filled 2020-08-09: qty 10

## 2020-08-09 MED ORDER — ONDANSETRON HCL 4 MG/2ML IJ SOLN
INTRAMUSCULAR | Status: DC | PRN
  Administered 2020-08-09: 4 mg via INTRAVENOUS

## 2020-08-09 MED ORDER — POTASSIUM CHLORIDE ER 10 MEQ PO TBCR
10.0000 meq | EXTENDED_RELEASE_TABLET | Freq: Every day | ORAL | Status: DC
  Administered 2020-08-09: 12:00:00 10 meq via ORAL
  Filled 2020-08-09: qty 1

## 2020-08-09 MED ORDER — PHENYLEPHRINE HCL-NACL 10-0.9 MG/250ML-% IV SOLN
INTRAVENOUS | Status: DC | PRN
  Administered 2020-08-09: 20 ug/min via INTRAVENOUS

## 2020-08-09 MED ORDER — PHENYLEPHRINE HCL (PRESSORS) 10 MG/ML IV SOLN
INTRAVENOUS | Status: DC | PRN
  Administered 2020-08-09: 80 ug via INTRAVENOUS

## 2020-08-09 MED ORDER — THROMBIN 5000 UNITS EX SOLR
CUTANEOUS | Status: DC | PRN
  Administered 2020-08-09 (×2): 5000 [IU] via TOPICAL

## 2020-08-09 MED ORDER — SODIUM CHLORIDE 0.9% FLUSH: 3.0000 mL | Freq: Two times a day (BID) | INTRAVENOUS | Status: DC

## 2020-08-09 MED ORDER — PHENYLEPHRINE 40 MCG/ML (10ML) SYRINGE FOR IV PUSH (FOR BLOOD PRESSURE SUPPORT)
PREFILLED_SYRINGE | INTRAVENOUS | Status: AC
  Filled 2020-08-09: qty 10

## 2020-08-09 MED ORDER — EPHEDRINE SULFATE 50 MG/ML IJ SOLN
INTRAMUSCULAR | Status: DC | PRN
  Administered 2020-08-09 (×2): 10 mg via INTRAVENOUS

## 2020-08-09 MED ORDER — BUPIVACAINE HCL (PF) 0.25 % IJ SOLN
INTRAMUSCULAR | Status: DC | PRN
  Administered 2020-08-09: 10 mL

## 2020-08-09 MED ORDER — DILTIAZEM HCL 30 MG PO TABS
30.0000 mg | ORAL_TABLET | ORAL | Status: DC | PRN
  Filled 2020-08-09: qty 1

## 2020-08-09 SURGICAL SUPPLY — 51 items
BAND RUBBER #18 3X1/16 STRL (MISCELLANEOUS) ×6 IMPLANT
BENZOIN TINCTURE PRP APPL 2/3 (GAUZE/BANDAGES/DRESSINGS) ×3 IMPLANT
BLADE CLIPPER SURG (BLADE) IMPLANT
BLADE SURG 11 STRL SS (BLADE) ×3 IMPLANT
BUR CUTTER 7.0 ROUND (BURR) ×3 IMPLANT
BUR MATCHSTICK NEURO 3.0 LAGG (BURR) ×3 IMPLANT
CANISTER SUCT 3000ML PPV (MISCELLANEOUS) ×3 IMPLANT
CARTRIDGE OIL MAESTRO DRILL (MISCELLANEOUS) ×1 IMPLANT
CLOSURE WOUND 1/2 X4 (GAUZE/BANDAGES/DRESSINGS) ×1
COVER WAND RF STERILE (DRAPES) ×3 IMPLANT
DECANTER SPIKE VIAL GLASS SM (MISCELLANEOUS) ×3 IMPLANT
DERMABOND ADVANCED (GAUZE/BANDAGES/DRESSINGS) ×2
DERMABOND ADVANCED .7 DNX12 (GAUZE/BANDAGES/DRESSINGS) ×1 IMPLANT
DIFFUSER DRILL AIR PNEUMATIC (MISCELLANEOUS) ×3 IMPLANT
DRAPE HALF SHEET 40X57 (DRAPES) IMPLANT
DRAPE LAPAROTOMY 100X72X124 (DRAPES) ×3 IMPLANT
DRAPE MICROSCOPE LEICA (MISCELLANEOUS) ×3 IMPLANT
DRAPE SURG 17X23 STRL (DRAPES) ×3 IMPLANT
DRSG OPSITE POSTOP 4X6 (GAUZE/BANDAGES/DRESSINGS) ×3 IMPLANT
DURAPREP 26ML APPLICATOR (WOUND CARE) ×3 IMPLANT
ELECT REM PT RETURN 9FT ADLT (ELECTROSURGICAL) ×3
ELECTRODE REM PT RTRN 9FT ADLT (ELECTROSURGICAL) ×1 IMPLANT
GAUZE 4X4 16PLY RFD (DISPOSABLE) IMPLANT
GAUZE SPONGE 4X4 12PLY STRL (GAUZE/BANDAGES/DRESSINGS) ×3 IMPLANT
GLOVE BIO SURGEON STRL SZ7 (GLOVE) ×3 IMPLANT
GLOVE BIO SURGEON STRL SZ8 (GLOVE) ×3 IMPLANT
GLOVE BIOGEL PI IND STRL 7.0 (GLOVE) ×1 IMPLANT
GLOVE BIOGEL PI INDICATOR 7.0 (GLOVE) ×2
GLOVE EXAM NITRILE XL STR (GLOVE) IMPLANT
GLOVE INDICATOR 8.5 STRL (GLOVE) ×3 IMPLANT
GOWN STRL REUS W/ TWL LRG LVL3 (GOWN DISPOSABLE) ×1 IMPLANT
GOWN STRL REUS W/ TWL XL LVL3 (GOWN DISPOSABLE) ×3 IMPLANT
GOWN STRL REUS W/TWL 2XL LVL3 (GOWN DISPOSABLE) ×3 IMPLANT
GOWN STRL REUS W/TWL LRG LVL3 (GOWN DISPOSABLE) ×3
GOWN STRL REUS W/TWL XL LVL3 (GOWN DISPOSABLE) ×9
KIT BASIN OR (CUSTOM PROCEDURE TRAY) ×3 IMPLANT
KIT TURNOVER KIT B (KITS) ×3 IMPLANT
NEEDLE HYPO 22GX1.5 SAFETY (NEEDLE) ×3 IMPLANT
NEEDLE SPNL 22GX3.5 QUINCKE BK (NEEDLE) ×3 IMPLANT
NS IRRIG 1000ML POUR BTL (IV SOLUTION) ×3 IMPLANT
OIL CARTRIDGE MAESTRO DRILL (MISCELLANEOUS) ×3
PACK LAMINECTOMY NEURO (CUSTOM PROCEDURE TRAY) ×3 IMPLANT
SPONGE SURGIFOAM ABS GEL SZ50 (HEMOSTASIS) ×3 IMPLANT
STRIP CLOSURE SKIN 1/2X4 (GAUZE/BANDAGES/DRESSINGS) ×2 IMPLANT
SUT VIC AB 0 CT1 18XCR BRD8 (SUTURE) ×1 IMPLANT
SUT VIC AB 0 CT1 8-18 (SUTURE) ×3
SUT VIC AB 2-0 CT1 18 (SUTURE) ×3 IMPLANT
SUT VICRYL 4-0 PS2 18IN ABS (SUTURE) ×3 IMPLANT
TOWEL GREEN STERILE (TOWEL DISPOSABLE) ×3 IMPLANT
TOWEL GREEN STERILE FF (TOWEL DISPOSABLE) ×3 IMPLANT
WATER STERILE IRR 1000ML POUR (IV SOLUTION) ×3 IMPLANT

## 2020-08-09 NOTE — Discharge Summary (Signed)
Physician Discharge Summary  Patient ID: [name] MRN: [id number] DOB/AGE: 1945/09/27 74 y.o. Estimated body mass index is 24.73 kg/m as calculated from the following:   Height as of this encounter: 5\' 5"  (1.651 m).   Weight as of this encounter: 67.4 kg.   Admit date: 08/09/2020 Discharge date: 08/09/2020  Admission Diagnoses: Lumbar spinal stenosis L3-4 lumbar spinal stenosis and herniated nucleus pulposus L4-5  Discharge Diagnoses: Same Discharged Condition: good  Hospital Course: Patient was admitted to the hospital underwent decompressive laminectomy on the right at L3-4 and L4-5 and a microdiscectomy at L4-5 postoperatively patient was ambulating and voiding spontaneously tolerating her diet stable for discharge home.  Consults: Significant Diagnostic Studies: Treatments: Decompressive laminectomy L3-4 L4-5 Discharge Exam: Blood pressure 127/64, pulse 67, temperature 97.6 F (36.4 C), resp. rate 18, height 5\' 5"  (1.651 m), weight 67.4 kg, SpO2 90 %. Strength 5/5 wound clean dry and intact  Disposition: Home      Signed: Elaina Hoops 08/09/2020, 2:34 PM

## 2020-08-09 NOTE — Evaluation (Signed)
Physical Therapy Evaluation and Discharge Patient Details Name: [name] MRN: [id number] DOB: [birth year] Today's Date: 08/09/2020   History of Present Illness  Pt is a 74 y/o male s/p L4-5 laminectomy and microdiscectomy. PMH includes a fib, CAD s/p CABG, COPD, and HTN.  Clinical Impression  Patient evaluated by Physical Therapy with no further acute PT needs identified. All education has been completed and the patient has no further questions. Pt overall steady with gait and stair navigation. Requiring min guard to supervision for safety; no physical assist required. Educated about back precautions and generalized walking program. Reports wife will be able to assist as needed at d/c. See below for any follow-up Physical Therapy or equipment needs. PT is signing off. Thank you for this referral. If needs change, please re-consult.     Follow Up Recommendations No PT follow up    Equipment Recommendations  None recommended by PT    Recommendations for Other Services       Precautions / Restrictions Precautions Precautions: Back Precaution Booklet Issued: Yes (comment) Precaution Comments: Reviewed back precautions with pt and how to maintain during ADLs and car transfers. Restrictions Weight Bearing Restrictions: No      Mobility  Bed Mobility Overal bed mobility: Needs Assistance Bed Mobility: Rolling;Sidelying to Sit;Sit to Sidelying Rolling: Supervision Sidelying to sit: Supervision     Sit to sidelying: Supervision General bed mobility comments: Supervision for safety. Cues for log roll technique    Transfers Overall transfer level: Needs assistance Equipment used: None Transfers: Sit to/from Stand Sit to Stand: Supervision         General transfer comment: Supervision for safety.  Ambulation/Gait Ambulation/Gait assistance: Supervision Gait Distance (Feet): 300 Feet Assistive device: None;IV Pole Gait Pattern/deviations: Step-through pattern;Decreased  stride length Gait velocity: Decreased   General Gait Details: Guarded gait, however, overall steady. No LOB noted. Educated about generalized walking program to perform at home.  Stairs Stairs: Yes Stairs assistance: Min guard Stair Management: One rail Right;Alternating pattern;Forwards Number of Stairs: 4 General stair comments: Overall steady stair navigation. Min guard for safety. No physical assist required.  Wheelchair Mobility    Modified Rankin (Stroke Patients Only)       Balance Overall balance assessment: No apparent balance deficits (not formally assessed)                                           Pertinent Vitals/Pain Pain Assessment: No/denies pain    Home Living Family/patient expects to be discharged to:: Private residence Living Arrangements: Spouse/significant other Available Help at Discharge: Family Type of Home: House Home Access: Stairs to enter Entrance Stairs-Rails: Right;Left;Can reach both Technical brewer of Steps: 4 Home Layout: One level Home Equipment: Environmental consultant - 2 wheels      Prior Function Level of Independence: Independent               Hand Dominance        Extremity/Trunk Assessment   Upper Extremity Assessment Upper Extremity Assessment: Overall WFL for tasks assessed    Lower Extremity Assessment Lower Extremity Assessment: Overall WFL for tasks assessed    Cervical / Trunk Assessment Cervical / Trunk Assessment: Other exceptions Cervical / Trunk Exceptions: s/p lumbar surgery  Communication   Communication: No difficulties  Cognition Arousal/Alertness: Awake/alert Behavior During Therapy: WFL for tasks assessed/performed Overall Cognitive Status: Within Functional Limits for tasks assessed  General Comments General comments (skin integrity, edema, etc.): Pt's wife present during session    Exercises     Assessment/Plan     PT Assessment Patent does not need any further PT services  PT Problem List         PT Treatment Interventions      PT Goals (Current goals can be found in the Care Plan section)  Acute Rehab PT Goals Patient Stated Goal: to go home PT Goal Formulation: With patient Time For Goal Achievement: 08/09/20 Potential to Achieve Goals: Good    Frequency     Barriers to discharge        Co-evaluation               AM-PAC PT "6 Clicks" Mobility  Outcome Measure Help needed turning from your back to your side while in a flat bed without using bedrails?: None Help needed moving from lying on your back to sitting on the side of a flat bed without using bedrails?: None Help needed moving to and from a bed to a chair (including a wheelchair)?: None Help needed standing up from a chair using your arms (e.g., wheelchair or bedside chair)?: None Help needed to walk in hospital room?: None Help needed climbing 3-5 steps with a railing? : A Little 6 Click Score: 23    End of Session Equipment Utilized During Treatment: Gait belt Activity Tolerance: Patient tolerated treatment well Patient left: in bed;with call bell/phone within reach;with family/visitor present Nurse Communication: Mobility status PT Visit Diagnosis: Other abnormalities of gait and mobility (R26.89)    Time: 4580-9983 PT Time Calculation (min) (ACUTE ONLY): 18 min   Charges:   PT Evaluation $PT Eval Low Complexity: 1 Low          Lou Miner, DPT  Acute Rehabilitation Services  Pager: [phone] Office: [phone]   Rudean Hitt 08/09/2020, 11:20 AM

## 2020-08-09 NOTE — H&P (Signed)
[name] is an 74 y.o. male.   Chief Complaint: Back and right leg pain HPI: 74 year old gentleman with back right hip and leg pain rating down L4 and L5 nerve root pattern.  Work-up revealed severe spinal stenosis L4-5 and L3-4.  Due to patient's progression of clinical syndrome weakness on clinical exam and dorsiflexion failed conservative treatment of recommended decompressive laminectomies at L3-4 and L4-5 on the right.  I extensively gone over the risks and benefits of that operation with him as well as perioperative course expectations of outcome and alternatives of surgery and he understands and agrees to proceed forward.  Past Medical History:  Diagnosis Date  . AAA (abdominal aortic aneurysm) ([place])    06/27/20 MRI: 3.0 cm infrarenal AAA, recommend 3 year follow-up  . Anxiety   . Atrial fibrillation ([place])   . Bilateral carotid artery disease ([place]) 07/21/2013  . CAD (coronary artery disease) 2008   CABG  . COPD (chronic obstructive pulmonary disease) ([place])   . Dysrhythmia    A-fib  . Gastritis   . Headache(784.0)   . Hypertension   . Visit for monitoring Tikosyn therapy 12/2015    Past Surgical History:  Procedure Laterality Date  . CARDIOVERSION N/A 09/08/2015   Procedure: CARDIOVERSION;  Surgeon: Thayer Headings, MD;  Location: [place];  Service: Cardiovascular;  Laterality: N/A;  . CORONARY ARTERY BYPASS GRAFT    . EXCISION OF KELOID Left 10/08/2018   Procedure: EXCISION OF CHRONIC ABDOMINAL WALL WOUND;  Surgeon: Coralie Keens, MD;  Location: [place];  Service: General;  Laterality: Left;  . INCISION AND DRAINAGE DEEP NECK ABSCESS    . ROTATOR CUFF REPAIR Bilateral   . TEE WITHOUT CARDIOVERSION N/A 08/02/2015   Procedure: TRANSESOPHAGEAL ECHOCARDIOGRAM (TEE);  Surgeon: Sueanne Margarita, MD;  Location: [facility] ENDOSCOPY;  Service: Cardiovascular;  Laterality: N/A;  . TEE WITHOUT CARDIOVERSION N/A 09/08/2015   Procedure: TRANSESOPHAGEAL ECHOCARDIOGRAM  (TEE);  Surgeon: Thayer Headings, MD;  Location: [facility] ENDOSCOPY;  Service: Cardiovascular;  Laterality: N/A;    Family History  Problem Relation Age of Onset  . Diabetes Mother   . Hypertension Mother   . Heart attack Father   . Sudden death Father   . Hypertension Sister   . Diabetes Sister    Social History:  reports that he quit smoking about 13 years ago. He has quit using smokeless tobacco.  His smokeless tobacco use included chew. He reports that he does not drink alcohol and does not use drugs.  Allergies:  Allergies  Allergen Reactions  . Doxycycline Shortness Of Breath    Also has to urinate more often  . Nsaids Hypertension    Gastritis     Medications Prior to Admission  Medication Sig Dispense Refill  . ALPRAZolam (XANAX) 0.25 MG tablet Take 1 tablet (0.25 mg total) by mouth daily. 30 tablet 2  . amLODipine (NORVASC) 5 MG tablet Take 1 tablet (5 mg total) by mouth daily. (Patient taking differently: Take 5 mg by mouth daily as needed (High blood pressure).) 30 tablet 6  . atorvastatin (LIPITOR) 40 MG tablet TAKE 1 TABLET(40 MG) BY MOUTH DAILY (Patient taking differently: Take 40 mg by mouth at bedtime.) 90 tablet 1  . diltiazem (CARDIZEM) 30 MG tablet Take 1 tablet every 4 hours AS NEEDED for heart rate >100 as long as blood pressure >100. (Patient taking differently: Take 30 mg by mouth every 4 (four) hours as needed (for heart rate >100 as long as  blood pressure >100.).) 45 tablet 1  . dofetilide (TIKOSYN) 500 MCG capsule TAKE 1 CAPSULE(500 MCG) BY MOUTH TWICE DAILY (Patient taking differently: Take 500 mcg by mouth 2 (two) times daily.) 180 capsule 1  . gabapentin (NEURONTIN) 300 MG capsule Take 1 capsule (300 mg total) by mouth at bedtime. (Patient taking differently: Take 300 mg by mouth daily as needed (Nerve Pain).) 30 capsule 1  . lisinopril (ZESTRIL) 20 MG tablet TAKE 1 TABLET(20 MG) BY MOUTH DAILY (Patient taking differently: Take 20 mg by mouth daily.) 90 tablet 1   . Multiple Vitamins-Minerals (MULTIVITAMIN WITH MINERALS) tablet Take 1 tablet by mouth at bedtime.     . Omega-3 Fatty Acids (FISH OIL) 1000 MG CPDR Take 1,000 mg by mouth at bedtime.     . potassium chloride (KLOR-CON) 10 MEQ tablet TAKE 1 TABLET(10 MEQ) BY MOUTH DAILY (Patient taking differently: Take 10 mEq by mouth daily.) 90 tablet 1  . vitamin C (ASCORBIC ACID) 500 MG tablet Take 500 mg by mouth at bedtime.     . rivaroxaban (XARELTO) 15 MG TABS tablet Take 1 tablet (15 mg total) by mouth daily with supper. (Patient taking differently: Take 15 mg by mouth at bedtime.) 90 tablet 3    No results found for this or any previous visit (from the past 48 hour(s)). No results found.  Review of Systems  Musculoskeletal: Positive for back pain.  Neurological: Positive for weakness and numbness.    Blood pressure (!) 142/93, pulse (!) 111, temperature 97.7 F (36.5 C), temperature source Oral, resp. rate 20, height 5\' 5"  (1.651 m), weight 67.4 kg, SpO2 96 %. Physical Exam HENT:     Head: Normocephalic.     Right Ear: Tympanic membrane normal.     Nose: Nose normal.     Mouth/Throat:     Mouth: Mucous membranes are moist.  Eyes:     Pupils: Pupils are equal, round, and reactive to light.  Cardiovascular:     Rate and Rhythm: Normal rate.     Pulses: Normal pulses.  Pulmonary:     Effort: Pulmonary effort is normal.  Abdominal:     General: Abdomen is flat.  Musculoskeletal:        General: Normal range of motion.     Cervical back: Normal range of motion.  Skin:    General: Skin is warm.  Neurological:     Mental Status: He is alert.     Comments: Left lower extremity strength is 5 out of 5 iliopsoas, quads, hamstrings, gastrocs, into tibialis, and EHL.  Right lower extremity has 4+ out of 5 weakness on dorsiflexion and EHL.  Otherwise 5 out of 5.      Assessment/Plan 74 year old gentleman presents for right-sided L3-4 L4-5 laminectomy partial medial facetectomy and  foraminotomies.  Elaina Hoops, MD 08/09/2020, 7:26 AM

## 2020-08-09 NOTE — Op Note (Signed)
Preoperative diagnosis: Lumbar spinal stenosis L3-4 with right-sided L3-L4 radiculopathy and lumbar spinal stenosis L4-5 with herniated nucleus pulposus L4-5 with right-sided L5 radiculopathy  Postoperative diagnosis: Same  Procedure: #1 decompressive lumbar laminectomy partial medial facetectomy and foraminotomies the L4 nerve root with microdissection and microscopic foraminotomies  2.  Decompressive lumbar laminectomy L4-5 and lumbar microdiscectomy L4-5 utilizing the operating microscope with partial medial facetectomy and foraminotomies of right L5 nerve root  Surgeon: Dominica Severin Wesleigh Markovic  Assistant: Nash Shearer  Anesthesia: General  EBL: Minimal  HPI: 74 year old gentleman with progressive worsening right hip and leg pain rating down an L4 nerve root pattern work-up revealed severe spinal stenosis L3-4 and L4-5 with a herniated disc at L4-5 with a superior fragment and severe foraminal stenosis of the L3-L4 and L5 nerve roots.  Due to patient progression of clinical syndrome imaging findings and failed conservative treatment I recommended decompressive laminectomy at L3-4 and L4-5 with possible microdiscectomy at L4-5 I extensively went over the risks and benefits of the operation with him as well as perioperative course expectations of outcome and alternatives of surgery and he understands and agrees to proceed forward.  Operative procedure: Patient brought into the OR was due to general anesthesia positioned prone the Wilson frame his back was prepped and draped in routine sterile fashion preoperative x-ray localized the appropriate level so after infiltration of 10 cc lidocaine with epi midline incision was made and Bovie elect cautery was used to take down the subcutaneous tissue and subperiosteal dissection carried lamina of L3-L4 and L5 on the right.  Intraoperative x-ray confirmed identification appropriate level.  So utilizing high-speed drill the inferior aspect of lamina of L3 medial facet  complex the entire lamina of L4 and the superior aspect of L5 was all drilled down laminotomy was begun at 4 5 with a 3 Miller Kerrison punch and remove the entire lamina of L4 marching superiorly remove the inferior third of L3 under bit the medial facet at 3 4 and 4 5 there was marked hourglass compression of thecal sac and large spurs coming off the inferior aspect of 3 4 facet joint down to 4 5.  Then the operating microscope was draped and brought into the field and microscope illumination further and by the medial facet was carried identified the L4 pedicle and the L4 nerve root was unroofed and decompressed at 3 4 marching inferiorly a large spurs removed and just inferior to the L4 nerve root to the superior aspect of the L5 nerve root and the L5 pedicle was identified the L5 foramen was decompressed and unroofed I then inspected the disc base it was a superior fragment rate underneath the L4 nerve root this was removed with a nerve hook and cleaned out this decompressed the L4 nerve root in the L4 foramen both L3 and L4 foramens were also noted to be patent I inspected the disc base itself which was bulging but not significantly compressive so I left this alone after the fragment ectomy.  Wounds and copiously irrigated meticulous hemostasis was maintained Gelfoam was ON top of the dura the muscle fascia approximate layers with interrupted Vicryl and the skin was closed with a running 4 subcuticular Dermabond benzoin Steri-Strips and a sterile dressing was applied patient recovery in stable condition.  At the end the case all needle counts and sponge counts were correct.

## 2020-08-09 NOTE — Progress Notes (Signed)
Patient is discharged from room 3C07 at this time. Alert and in stable condition. IV site d/c'd and instructions read to patient and spouse with understanding verbalized. Left unit via wheelchair with all belongings at side. 

## 2020-08-09 NOTE — Transfer of Care (Signed)
Immediate Anesthesia Transfer of Care Note  Patient: [name]  Procedure(s) Performed: Laminectomy and Foraminotomy - Lumbar three-Lumbar four - Lumbar four-Lumbar five- right (Right Back)  Patient Location: PACU  Anesthesia Type:General  Level of Consciousness: awake, alert , oriented and patient cooperative  Airway & Oxygen Therapy: Patient Spontanous Breathing  Post-op Assessment: Report given to RN and Post -op Vital signs reviewed and stable  Post vital signs: Reviewed and stable  Last Vitals:  Vitals Value Taken Time  BP 147/89 08/09/20 0929  Temp    Pulse 61 08/09/20 0931  Resp 13 08/09/20 0931  SpO2 97 % 08/09/20 0931  Vitals shown include unvalidated device data.  Last Pain:  Vitals:   08/09/20 0620  TempSrc: Oral  PainSc:       Patients Stated Pain Goal: 3 (58/85/02 7741)  Complications: No complications documented.

## 2020-08-09 NOTE — Anesthesia Postprocedure Evaluation (Signed)
Anesthesia Post Note  Patient: [name]  Procedure(s) Performed: Laminectomy and Foraminotomy - Lumbar three-Lumbar four - Lumbar four-Lumbar five- right (Right Back)     Patient location during evaluation: PACU Anesthesia Type: General Level of consciousness: awake and alert, oriented and awake Pain management: pain level controlled Vital Signs Assessment: post-procedure vital signs reviewed and stable Respiratory status: spontaneous breathing, nonlabored ventilation, respiratory function stable and patient connected to nasal cannula oxygen Cardiovascular status: blood pressure returned to baseline and stable Postop Assessment: no apparent nausea or vomiting Anesthetic complications: no   No complications documented.  Last Vitals:  Vitals:   08/09/20 1213 08/09/20 1546  BP: 127/64 124/67  Pulse: 67 60  Resp: 18 18  Temp: 36.4 C 36.5 C  SpO2: 90% 92%    Last Pain:  Vitals:   08/09/20 1229  TempSrc:   PainSc: [place]

## 2020-08-09 NOTE — Anesthesia Procedure Notes (Signed)
Procedure Name: Intubation Date/Time: 08/09/2020 7:42 AM Performed by: Oletta Lamas, CRNA Pre-anesthesia Checklist: Patient identified, Emergency Drugs available, Suction available and Patient being monitored Patient Re-evaluated:Patient Re-evaluated prior to induction Oxygen Delivery Method: Circle System Utilized Preoxygenation: Pre-oxygenation with 100% oxygen Induction Type: IV induction Ventilation: Mask ventilation without difficulty Laryngoscope Size: Miller and 2 Grade View: Grade I Tube type: Oral Number of attempts: 1 Airway Equipment and Method: Stylet Placement Confirmation: ETT inserted through vocal cords under direct vision,  positive ETCO2 and breath sounds checked- equal and bilateral Secured at: 23 cm Tube secured with: Tape Dental Injury: Teeth and Oropharynx as per pre-operative assessment

## 2020-08-09 NOTE — Discharge Instructions (Signed)
Wound Care  Keep the incision clean and dry remove the outer dressing in 2 days, leave the Steri-Strips intact. Do not put any creams, lotions, or ointments on incision. Leave steri-strips on back.  They will fall off by themselves.  Activity Walk each and every day, increasing distance each day. No lifting greater than 5 lbs.  No lifting no bending no twisting no driving or riding a car unless coming back and forth to see me. If provided with back brace, wear when out of bed.  It is not necessary to wear brace in bed. Diet Resume your normal diet.   Return to Work Will be discussed at you follow up appointment.  Call Your Doctor If Any of These Occur Redness, drainage, or swelling at the wound.  Temperature greater than 101 degrees. Severe pain not relieved by pain medication. Incision starts to come apart. Follow Up Appt Call today for appointment in 1-2 weeks ([phone]) or for problems.

## 2020-08-10 ENCOUNTER — Encounter (HOSPITAL_COMMUNITY): Payer: Self-pay | Admitting: Neurosurgery

## 2020-08-18 ENCOUNTER — Other Ambulatory Visit: Payer: Self-pay | Admitting: Family Medicine

## 2020-08-18 DIAGNOSIS — F419 Anxiety disorder, unspecified: Secondary | ICD-10-CM

## 2020-08-20 ENCOUNTER — Other Ambulatory Visit: Payer: Self-pay | Admitting: Family Medicine

## 2020-09-02 ENCOUNTER — Other Ambulatory Visit: Payer: Self-pay

## 2020-09-02 ENCOUNTER — Ambulatory Visit (HOSPITAL_COMMUNITY)
Admission: RE | Admit: 2020-09-02 | Discharge: 2020-09-02 | Disposition: A | Discharge status: Home / Self Care | Payer: Medicare Other | Source: Ambulatory Visit | Attending: Cardiovascular Disease | Admitting: Cardiovascular Disease

## 2020-09-02 DIAGNOSIS — I779 Disorder of arteries and arterioles, unspecified: Secondary | ICD-10-CM | POA: Insufficient documentation

## 2020-09-02 DIAGNOSIS — I6521 Occlusion and stenosis of right carotid artery: Secondary | ICD-10-CM | POA: Diagnosis not present

## 2020-09-04 DIAGNOSIS — Z23 Encounter for immunization: Secondary | ICD-10-CM | POA: Diagnosis not present

## 2020-09-09 ENCOUNTER — Other Ambulatory Visit: Payer: Self-pay

## 2020-09-09 ENCOUNTER — Ambulatory Visit (HOSPITAL_COMMUNITY)
Admission: RE | Admit: 2020-09-09 | Discharge: 2020-09-09 | Disposition: A | Discharge status: Home / Self Care | Payer: Medicare Other | Source: Ambulatory Visit | Attending: Nurse Practitioner | Admitting: Nurse Practitioner

## 2020-09-09 VITALS — BP 190/106 | HR 63 | Ht 65.0 in | Wt 147.0 lb

## 2020-09-09 DIAGNOSIS — I2510 Atherosclerotic heart disease of native coronary artery without angina pectoris: Secondary | ICD-10-CM | POA: Diagnosis not present

## 2020-09-09 DIAGNOSIS — Z87891 Personal history of nicotine dependence: Secondary | ICD-10-CM | POA: Diagnosis not present

## 2020-09-09 DIAGNOSIS — Z79899 Other long term (current) drug therapy: Secondary | ICD-10-CM | POA: Diagnosis not present

## 2020-09-09 DIAGNOSIS — Z634 Disappearance and death of family member: Secondary | ICD-10-CM | POA: Diagnosis not present

## 2020-09-09 DIAGNOSIS — D6869 Other thrombophilia: Secondary | ICD-10-CM | POA: Diagnosis not present

## 2020-09-09 DIAGNOSIS — Z7901 Long term (current) use of anticoagulants: Secondary | ICD-10-CM | POA: Insufficient documentation

## 2020-09-09 DIAGNOSIS — I480 Paroxysmal atrial fibrillation: Secondary | ICD-10-CM | POA: Diagnosis not present

## 2020-09-09 DIAGNOSIS — I10 Essential (primary) hypertension: Secondary | ICD-10-CM | POA: Diagnosis not present

## 2020-09-09 DIAGNOSIS — Z951 Presence of aortocoronary bypass graft: Secondary | ICD-10-CM | POA: Insufficient documentation

## 2020-09-09 LAB — MAGNESIUM: Magnesium: 2.2 mg/dL (ref 1.7–2.4)

## 2020-09-09 NOTE — Progress Notes (Signed)
Patient ID: [name], male   DOB: April 05, 1946, 75 y.o.   MRN: [id number]     Primary Care Physician: Erven Colla, DO Referring Physician:Dr. Macguire Holsinger is a 75 y.o. male with a h/o PAF that underwent tikosyn loading at [facility] 01/10/16 . He has held in [place] very nicely . His grandson dies last week and after the service did noticed some fast irregular heart besat but only lasted around one hour.   No change form his usual health. 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 Tries to stay hydrated. Has gotten both covid shots.   BP elevated today but at home systolic is around 431-540 systolic. Just took meds about an hour ago. Heart rate is in the upper 40's today. Not symptomatic with this. He is not on AV nodal drugs. He sees HR 's in the 60's at home.    F/u in afib clinic, 09/10/19. He is s/p decompressive laminectomy  12/13. He is recovering nicely. He is staying in [place]. Qt stable. He is continuing on Tikosyn 500 mcg bid.    Today, he denies symptoms of palpitations, chest pain, shortness of breath, orthopnea, PND, lower extremity edema, dizziness, presyncope, syncope, or neurologic sequela. The patient is tolerating medications without difficulties and is otherwise without complaint today.   Past Medical History:  Diagnosis Date  . AAA (abdominal aortic aneurysm) ([place])    06/27/20 MRI: 3.0 cm infrarenal AAA, recommend 3 year follow-up  . Anxiety   . Atrial fibrillation ([place])   . Bilateral carotid artery disease ([place]) 07/21/2013  . CAD (coronary artery disease) 2008   CABG  . COPD (chronic obstructive pulmonary disease) ([place])   . Dysrhythmia    A-fib  . Gastritis   . Headache(784.0)   . Hypertension   . Visit for monitoring Tikosyn therapy 12/2015   Past Surgical History:  Procedure Laterality Date  . CARDIOVERSION N/A 09/08/2015   Procedure: CARDIOVERSION;  Surgeon: Thayer Headings, MD;  Location: [place];  Service:  Cardiovascular;  Laterality: N/A;  . CORONARY ARTERY BYPASS GRAFT    . EXCISION OF KELOID Left 10/08/2018   Procedure: EXCISION OF CHRONIC ABDOMINAL WALL WOUND;  Surgeon: Coralie Keens, MD;  Location: [place];  Service: General;  Laterality: Left;  . INCISION AND DRAINAGE DEEP NECK ABSCESS    . LUMBAR LAMINECTOMY/DECOMPRESSION MICRODISCECTOMY Right 08/09/2020   Procedure: Laminectomy and Foraminotomy - Lumbar three-Lumbar four - Lumbar four-Lumbar five- right;  Surgeon: Kary Kos, MD;  Location: [place];  Service: Neurosurgery;  Laterality: Right;  . ROTATOR CUFF REPAIR Bilateral   . TEE WITHOUT CARDIOVERSION N/A 08/02/2015   Procedure: TRANSESOPHAGEAL ECHOCARDIOGRAM (TEE);  Surgeon: Sueanne Margarita, MD;  Location: [facility] ENDOSCOPY;  Service: Cardiovascular;  Laterality: N/A;  . TEE WITHOUT CARDIOVERSION N/A 09/08/2015   Procedure: TRANSESOPHAGEAL ECHOCARDIOGRAM (TEE);  Surgeon: Thayer Headings, MD;  Location: [facility] ENDOSCOPY;  Service: Cardiovascular;  Laterality: N/A;    Current Outpatient Medications  Medication Sig Dispense Refill  . ALPRAZolam (XANAX) 0.25 MG tablet TAKE 1 TABLET(0.25 MG) BY MOUTH DAILY 30 tablet 0  . amLODipine (NORVASC) 5 MG tablet Take 1 tablet (5 mg total) by mouth daily. (Patient taking differently: Take 5 mg by mouth daily as needed (High blood pressure).) 30 tablet 6  . atorvastatin (LIPITOR) 40 MG tablet TAKE 1 TABLET(40 MG) BY MOUTH DAILY (Patient taking differently: Take 40 mg by mouth at bedtime.) 90  tablet 1  . diltiazem (CARDIZEM) 30 MG tablet Take 1 tablet every 4 hours AS NEEDED for heart rate >100 as long as blood pressure >100. (Patient taking differently: Take 30 mg by mouth every 4 (four) hours as needed (for heart rate >100 as long as blood pressure >100.).) 45 tablet 1  . dofetilide (TIKOSYN) 500 MCG capsule TAKE 1 CAPSULE(500 MCG) BY MOUTH TWICE DAILY (Patient taking differently: Take 500 mcg by mouth 2 (two) times daily.) 180 capsule 1  .  lisinopril (ZESTRIL) 20 MG tablet TAKE 1 TABLET(20 MG) BY MOUTH DAILY (Patient taking differently: Take 20 mg by mouth daily.) 90 tablet 1  . Multiple Vitamins-Minerals (MULTIVITAMIN WITH MINERALS) tablet Take 1 tablet by mouth at bedtime.     . Omega-3 Fatty Acids (FISH OIL) 1000 MG CPDR Take 1,000 mg by mouth at bedtime.     . potassium chloride (KLOR-CON) 10 MEQ tablet TAKE 1 TABLET(10 MEQ) BY MOUTH DAILY 90 tablet 0  . rivaroxaban (XARELTO) 15 MG TABS tablet Take 1 tablet (15 mg total) by mouth daily with supper. (Patient taking differently: Take 15 mg by mouth at bedtime.) 90 tablet 3  . vitamin C (ASCORBIC ACID) 500 MG tablet Take 500 mg by mouth at bedtime.     . gabapentin (NEURONTIN) 300 MG capsule Take 1 capsule (300 mg total) by mouth at bedtime. (Patient not taking: Reported on 09/09/2020) 30 capsule 1   No current facility-administered medications for this encounter.    Allergies  Allergen Reactions  . Doxycycline Shortness Of Breath    Also has to urinate more often  . Nsaids Hypertension    Gastritis     Social History   Socioeconomic History  . Marital status: Married    Spouse name: Not on file  . Number of children: Not on file  . Years of education: Not on file  . Highest education level: Not on file  Occupational History  . Not on file  Tobacco Use  . Smoking status: Former Smoker    Quit date: 08/28/2006    Years since quitting: 14.0  . Smokeless tobacco: Former Systems developer    Types: Secondary school teacher  . Vaping Use: Never used  Substance and Sexual Activity  . Alcohol use: No    Comment: quit drinking in the 70's  . Drug use: No  . Sexual activity: Not on file  Other Topics Concern  . Not on file  Social History Narrative  . Not on file   Social Determinants of Health   Financial Resource Strain: Not on file  Food Insecurity: Not on file  Transportation Needs: Not on file  Physical Activity: Not on file  Stress: Not on file  Social Connections: Not on  file  Intimate Partner Violence: Not on file    Family History  Problem Relation Age of Onset  . Diabetes Mother   . Hypertension Mother   . Heart attack Father   . Sudden death Father   . Hypertension Sister   . Diabetes Sister     ROS- All systems are reviewed and negative except as per the HPI above  Physical Exam: Vitals:   09/09/20 1040  BP: (!) 190/106  Pulse: 63  Weight: 66.7 kg  Height: 5\' 5"  (1.651 m)    GEN- The patient is well appearing, alert and oriented x 3 today.   Head- normocephalic, atraumatic Eyes-  Sclera clear, conjunctiva pink Ears- hearing intact Oropharynx- clear Neck- supple, no JVP Lymph-  no cervical lymphadenopathy Lungs- Clear to ausculation bilaterally, normal work of breathing Heart- Regular rate and rhythm, no murmurs, rubs or gallops, PMI not laterally displaced GI- soft, NT, ND, + BS Extremities- no clubbing, cyanosis, or edema MS- no significant deformity or atrophy Skin- no rash or lesion Psych- euthymic mood, full affect Neuro- strength and sensation are intact  EKG- NSR at 66 bpm, qtc stable  Epic records reviewed  Assessment and Plan: 1. Paroxysmal atrial fibrillation Has been staying in [place]  Continue tikosyn 500 mg bid Recent decrease in  xarelto to 15  mg a day for chadsvasc score of at least 3,  creatinine was running at 1.25 mg/dl Now with creatinine of 1.07 and crcl cal at  55.39, he would qualify for 20 mg daily, I will clarify with PharmD for correct dose  Mag today, last K+ 4.2 08/06/20  2. H/o Carotid artery disease  Per Dr. Marlou Porch  3. HTN Elevated on presentation, on recheck 140/80   F/u in 6 months    Butch Penny C. Rossy Virag, [facility] [address] [place], [place] [address] [phone]

## 2020-09-10 ENCOUNTER — Other Ambulatory Visit (HOSPITAL_COMMUNITY): Payer: Self-pay | Admitting: *Deleted

## 2020-09-10 DIAGNOSIS — I480 Paroxysmal atrial fibrillation: Secondary | ICD-10-CM

## 2020-09-10 MED ORDER — RIVAROXABAN 20 MG PO TABS
20.0000 mg | ORAL_TABLET | Freq: Every day | ORAL | 3 refills | Status: DC
Start: 2020-09-10 — End: 2021-01-14

## 2020-09-13 ENCOUNTER — Other Ambulatory Visit (HOSPITAL_COMMUNITY): Payer: Self-pay | Admitting: Nurse Practitioner

## 2020-09-15 ENCOUNTER — Encounter: Payer: Self-pay | Admitting: Cardiology

## 2020-09-15 ENCOUNTER — Other Ambulatory Visit: Payer: Self-pay

## 2020-09-15 ENCOUNTER — Ambulatory Visit (INDEPENDENT_AMBULATORY_CARE_PROVIDER_SITE_OTHER): Payer: Medicare Other | Admitting: Cardiology

## 2020-09-15 VITALS — BP 130/70 | HR 74 | Ht 65.0 in | Wt 145.8 lb

## 2020-09-15 DIAGNOSIS — I779 Disorder of arteries and arterioles, unspecified: Secondary | ICD-10-CM | POA: Diagnosis not present

## 2020-09-15 DIAGNOSIS — Z7901 Long term (current) use of anticoagulants: Secondary | ICD-10-CM | POA: Diagnosis not present

## 2020-09-15 DIAGNOSIS — I480 Paroxysmal atrial fibrillation: Secondary | ICD-10-CM | POA: Diagnosis not present

## 2020-09-15 NOTE — Patient Instructions (Signed)
Medication Instructions:  Your physician recommends that you continue on your current medications as directed. Please refer to the Current Medication list given to you today.  *If you need a refill on your cardiac medications before your next appointment, please call your pharmacy*   Lab Work: none If you have labs (blood work) drawn today and your tests are completely normal, you will receive your results only by: [name] MyChart Message (if you have MyChart) OR . A paper copy in the mail If you have any lab test that is abnormal or we need to change your treatment, we will call you to review the results.   Testing/Procedures: Carotid Ultrasound on one year    Follow-Up: At [facility], you and your health needs are our priority.  As part of our continuing mission to provide you with exceptional heart care, we have created designated Provider Care Teams.  These Care Teams include your primary Cardiologist (physician) and Advanced Practice Providers (APPs -  Physician Assistants and Nurse Practitioners) who all work together to provide you with the care you need, when you need it.  We recommend signing up for the patient portal called "MyChart".  Sign up information is provided on this After Visit Summary.  MyChart is used to connect with patients for Virtual Visits (Telemedicine).  Patients are able to view lab/test results, encounter notes, upcoming appointments, etc.  Non-urgent messages can be sent to your provider as well.   To learn more about what you can do with MyChart, go to [email or web address].    Your next appointment:   1 year(s)  The format for your next appointment:   In Person  Provider:   Candee Furbish, MD   Other Instructions

## 2020-09-15 NOTE — Progress Notes (Signed)
Cardiology Office Note:    Date:  09/15/2020   ID:  [name], [name] Jul 09, 1946, MRN [id number]  PCP:  Erven Colla, DO  [facility] Cardiologist:  Candee Furbish, MD  [facility] Electrophysiologist:  None   Referring MD: Erven Colla, DO     History of Present Illness:    KAELEM BRACH is a 75 y.o. male here for follow up of PAF  Tikosyn 500 bid  Recent spine surgery 08/09/20. Main complaint now is just slowly getting better with his back surgery.  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  Past Medical History:  Diagnosis Date  . AAA (abdominal aortic aneurysm) ([place])    06/27/20 MRI: 3.0 cm infrarenal AAA, recommend 3 year follow-up  . Anxiety   . Atrial fibrillation ([place])   . Bilateral carotid artery disease ([place]) 07/21/2013  . CAD (coronary artery disease) 2008   CABG  . COPD (chronic obstructive pulmonary disease) ([place])   . Dysrhythmia    A-fib  . Gastritis   . Headache(784.0)   . Hypertension   . Visit for monitoring Tikosyn therapy 12/2015    Past Surgical History:  Procedure Laterality Date  . CARDIOVERSION N/A 09/08/2015   Procedure: CARDIOVERSION;  Surgeon: Thayer Headings, MD;  Location: [place];  Service: Cardiovascular;  Laterality: N/A;  . CORONARY ARTERY BYPASS GRAFT    . EXCISION OF KELOID Left 10/08/2018   Procedure: EXCISION OF CHRONIC ABDOMINAL WALL WOUND;  Surgeon: Coralie Keens, MD;  Location: [place];  Service: General;  Laterality: Left;  . INCISION AND DRAINAGE DEEP NECK ABSCESS    . LUMBAR LAMINECTOMY/DECOMPRESSION MICRODISCECTOMY Right 08/09/2020   Procedure: Laminectomy and Foraminotomy - Lumbar three-Lumbar four - Lumbar four-Lumbar five- right;  Surgeon: Kary Kos, MD;  Location: [place];  Service: Neurosurgery;  Laterality: Right;  . ROTATOR CUFF REPAIR  Bilateral   . TEE WITHOUT CARDIOVERSION N/A 08/02/2015   Procedure: TRANSESOPHAGEAL ECHOCARDIOGRAM (TEE);  Surgeon: Sueanne Margarita, MD;  Location: [facility] ENDOSCOPY;  Service: Cardiovascular;  Laterality: N/A;  . TEE WITHOUT CARDIOVERSION N/A 09/08/2015   Procedure: TRANSESOPHAGEAL ECHOCARDIOGRAM (TEE);  Surgeon: Thayer Headings, MD;  Location: [facility] ENDOSCOPY;  Service: Cardiovascular;  Laterality: N/A;    Current Medications: Current Meds  Medication Sig  . ALPRAZolam (XANAX) 0.25 MG tablet TAKE 1 TABLET(0.25 MG) BY MOUTH DAILY  . amLODipine (NORVASC) 5 MG tablet Take 1 tablet (5 mg total) by mouth daily.  [name] atorvastatin (LIPITOR) 40 MG tablet TAKE 1 TABLET(40 MG) BY MOUTH DAILY  . diltiazem (CARDIZEM) 30 MG tablet Take 1 tablet every 4 hours AS NEEDED for heart rate >100 as long as blood pressure >100.  [name] dofetilide (TIKOSYN) 500 MCG capsule TAKE 1 CAPSULE(500 MCG) BY MOUTH TWICE DAILY  . gabapentin (NEURONTIN) 300 MG capsule Take 1 capsule (300 mg total) by mouth at bedtime.  [name] lisinopril (ZESTRIL) 20 MG tablet TAKE 1 TABLET(20 MG) BY MOUTH DAILY  . Multiple Vitamins-Minerals (MULTIVITAMIN WITH MINERALS) tablet Take 1 tablet by mouth at bedtime.   . Omega-3 Fatty Acids (FISH OIL) 1000 MG CPDR Take 1,000 mg by mouth at bedtime.   . potassium chloride (KLOR-CON) 10 MEQ tablet TAKE 1 TABLET(10 MEQ) BY MOUTH DAILY  . Rivaroxaban (XARELTO) 20 MG TABS tablet Take 1 tablet (20 mg total) by mouth daily with supper.  [name]  vitamin C (ASCORBIC ACID) 500 MG tablet Take 500 mg by mouth at bedtime.      Allergies:   Doxycycline and Nsaids   Social History   Socioeconomic History  . Marital status: Married    Spouse name: Not on file  . Number of children: Not on file  . Years of education: Not on file  . Highest education level: Not on file  Occupational History  . Not on file  Tobacco Use  . Smoking status: Former Smoker    Quit date: 08/28/2006    Years since quitting: 14.0  . Smokeless tobacco:  Former Systems developer    Types: Secondary school teacher  . Vaping Use: Never used  Substance and Sexual Activity  . Alcohol use: No    Comment: quit drinking in the 70's  . Drug use: No  . Sexual activity: Not on file  Other Topics Concern  . Not on file  Social History Narrative  . Not on file   Social Determinants of Health   Financial Resource Strain: Not on file  Food Insecurity: Not on file  Transportation Needs: Not on file  Physical Activity: Not on file  Stress: Not on file  Social Connections: Not on file     Family History: The patient's family history includes Diabetes in his mother and sister; Heart attack in his father; Hypertension in his mother and sister; Sudden death in his father.  ROS:   Please see the history of present illness.     All other systems reviewed and are negative.  EKGs/Labs/Other Studies Reviewed:    The following studies were reviewed today:  ECHO 12/2015  - Compared to the most recent echo in 08/2015, the LVEF is slightly improved at 45-50% wtih inferoseptal wall motion and strain abnormalities.  EKG: Sinus rhythm QTc 458 ms.  Recent Labs: 01/12/2020: ALT 13 03/10/2020: TSH 1.558 08/06/2020: BUN 19; Creatinine, Ser 1.07; Hemoglobin 14.6; Platelets 219; Potassium 4.2; Sodium 141 09/09/2020: Magnesium 2.2  Recent Lipid Panel    Component Value Date/Time   CHOL 141 01/12/2020 0838   TRIG 71 01/12/2020 0838   HDL 57 01/12/2020 0838   CHOLHDL 2.5 01/12/2020 0838   CHOLHDL 2.6 08/03/2014 0822   VLDL 10 08/03/2014 0822   LDLCALC 70 01/12/2020 0838     Risk Assessment/Calculations:       Physical Exam:    VS:  BP 130/70 (BP Location: Left Arm, Patient Position: Sitting, Cuff Size: Normal)   Pulse 74   Ht 5\' 5"  (1.651 m)   Wt 145 lb 12.8 oz (66.1 kg)   SpO2 95%   BMI 24.26 kg/m     Wt Readings from Last 3 Encounters:  09/15/20 145 lb 12.8 oz (66.1 kg)  09/09/20 147 lb (66.7 kg)  08/09/20 148 lb 9.4 oz (67.4 kg)     GEN:  Well  nourished, well developed in no acute distress HEENT: Normal NECK: No JVD; No carotid bruits LYMPHATICS: No lymphadenopathy CARDIAC: RRR, no murmurs, rubs, gallops RESPIRATORY:  Clear to auscultation without rales, wheezing or rhonchi  ABDOMEN: Soft, non-tender, non-distended MUSCULOSKELETAL:  No edema; No deformity  SKIN: Warm and dry NEUROLOGIC:  Alert and oriented x 3 PSYCHIATRIC:  Normal affect   ASSESSMENT:    1. Paroxysmal atrial fibrillation (HCC)   2. Bilateral carotid artery disease, unspecified type ([place])   3. Chronic anticoagulation    PLAN:    In order of problems listed above:  PAF  - continue with [place]  500 BID. QT reassuring  - No AV nodal agents  - has been to AFIB clinic  Chronic anticoagulation  - Xarelto (has been dose adjusted in the past)  CAD post CABG  - 2008, stable no angina  Carotid disease  - Right carotid 40 to 59% Left carotid 1 to 39%  Repeat carotid Dopplers in 1 year.  Hyperlipidemia  - atorvastatin 40  - LDL 73 recently  - no myalgias  Essential hypertension  - At home 130-140, here today 130/70.  Much better compared to atrial fibrillation clinic visit.  AAA  - 3.0cm 05/2020, follow up 3 years after  Doing well, no changes. 1 year follow-up.       Medication Adjustments/Labs and Tests Ordered: Current medicines are reviewed at length with the patient today.  Concerns regarding medicines are outlined above.  Orders Placed This Encounter  Procedures  . VAS US CAROTID   No orders of the defined types were placed in this encounter.   Patient Instructions  Medication Instructions:  Your physician recommends that you continue on your current medications as directed. Please refer to the Current Medication list given to you today.  *If you need a refill on your cardiac medications before your next appointment, please call your pharmacy*   Lab Work: none If you have labs (blood work) drawn today and your tests are  completely normal, you will receive your results only by: [name] MyChart Message (if you have MyChart) OR . A paper copy in the mail If you have any lab test that is abnormal or we need to change your treatment, we will call you to review the results.   Testing/Procedures: Carotid Ultrasound on one year    Follow-Up: At [facility], you and your health needs are our priority.  As part of our continuing mission to provide you with exceptional heart care, we have created designated Provider Care Teams.  These Care Teams include your primary Cardiologist (physician) and Advanced Practice Providers (APPs -  Physician Assistants and Nurse Practitioners) who all work together to provide you with the care you need, when you need it.  We recommend signing up for the patient portal called "MyChart".  Sign up information is provided on this After Visit Summary.  MyChart is used to connect with patients for Virtual Visits (Telemedicine).  Patients are able to view lab/test results, encounter notes, upcoming appointments, etc.  Non-urgent messages can be sent to your provider as well.   To learn more about what you can do with MyChart, go to [email or web address].    Your next appointment:   1 year(s)  The format for your next appointment:   In Person  Provider:   Candee Furbish, MD   Other Instructions      Signed, Candee Furbish, MD  09/15/2020 9:50 AM    [place]

## 2020-10-06 ENCOUNTER — Other Ambulatory Visit: Payer: Self-pay | Admitting: Family Medicine

## 2020-10-06 DIAGNOSIS — F419 Anxiety disorder, unspecified: Secondary | ICD-10-CM

## 2020-10-07 NOTE — Telephone Encounter (Signed)
Needs appt to f/u insomnia meds.  Thx. Dr. Lovena Le

## 2020-10-08 NOTE — Telephone Encounter (Signed)
Scheduled appointment

## 2020-10-11 ENCOUNTER — Ambulatory Visit: Payer: Medicare Other | Admitting: Family Medicine

## 2020-10-12 ENCOUNTER — Ambulatory Visit (HOSPITAL_COMMUNITY): Payer: Medicare Other | Admitting: Physical Therapy

## 2020-10-19 ENCOUNTER — Ambulatory Visit (HOSPITAL_COMMUNITY): Payer: Medicare Other | Admitting: Physical Therapy

## 2020-10-20 ENCOUNTER — Ambulatory Visit (HOSPITAL_COMMUNITY): Payer: Medicare Other | Admitting: Physical Therapy

## 2020-10-20 ENCOUNTER — Telehealth (HOSPITAL_COMMUNITY): Payer: Self-pay | Admitting: Physical Therapy

## 2020-10-20 NOTE — Telephone Encounter (Signed)
pt cancelled due to he has a cold.

## 2020-10-21 ENCOUNTER — Other Ambulatory Visit: Payer: Self-pay

## 2020-10-21 ENCOUNTER — Ambulatory Visit (INDEPENDENT_AMBULATORY_CARE_PROVIDER_SITE_OTHER): Payer: Medicare Other | Admitting: Family Medicine

## 2020-10-21 ENCOUNTER — Encounter: Payer: Self-pay | Admitting: Family Medicine

## 2020-10-21 VITALS — HR 49 | Temp 97.9°F

## 2020-10-21 DIAGNOSIS — J441 Chronic obstructive pulmonary disease with (acute) exacerbation: Secondary | ICD-10-CM

## 2020-10-21 DIAGNOSIS — Z87898 Personal history of other specified conditions: Secondary | ICD-10-CM | POA: Diagnosis not present

## 2020-10-21 MED ORDER — AMOXICILLIN-POT CLAVULANATE 875-125 MG PO TABS: 1.0000 | ORAL_TABLET | Freq: Two times a day (BID) | ORAL | 0 refills | Status: DC

## 2020-10-21 MED ORDER — BENZONATATE 100 MG PO CAPS: 100.0000 mg | ORAL_CAPSULE | Freq: Two times a day (BID) | ORAL | 0 refills | Status: DC | PRN

## 2020-10-21 MED ORDER — HYDROCODONE-HOMATROPINE 5-1.5 MG/5ML PO SYRP: 5.0000 mL | ORAL_SOLUTION | Freq: Every evening | ORAL | 0 refills | Status: DC | PRN

## 2020-10-21 NOTE — Progress Notes (Signed)
   Subjective:    Patient ID: [name], male    DOB: [birth year], 75 y.o.   MRN: [id number]  HPI  Patient presents today with respiratory illness Number of days present- since feb 10 (for about 2 wks)  Symptoms include- cough, congestion  Presence of worrisome signs (severe shortness of breath, lethargy, etc.) - none  Recent/current visit to urgent care or ER- none  Recent direct exposure to Covid- none  Any current Covid testing- none    Review of Systems  Constitutional: Negative for chills and fever.  HENT: Positive for congestion. Negative for ear pain, rhinorrhea, sinus pressure, sinus pain, sneezing and sore throat.   Eyes: Negative for pain, discharge and itching.  Respiratory: Positive for cough.   Gastrointestinal: Negative for diarrhea, nausea and vomiting.  Skin: Negative for rash.  Neurological: Negative for headaches.       Vitals:   10/21/20 1527  Pulse: (!) 49  Temp: 97.9 F (36.6 C)  SpO2: 99%    Objective:   Physical Exam Vitals and nursing note reviewed.  Constitutional:      General: He is not in acute distress.    Appearance: Normal appearance. He is not ill-appearing.  HENT:     Head: Normocephalic and atraumatic.     Right Ear: Tympanic membrane, ear canal and external ear normal.     Left Ear: Tympanic membrane, ear canal and external ear normal.     Nose: Nose normal. No congestion or rhinorrhea.     Mouth/Throat:     Mouth: Mucous membranes are moist.     Pharynx: No oropharyngeal exudate or posterior oropharyngeal erythema.  Eyes:     Extraocular Movements: Extraocular movements intact.     Conjunctiva/sclera: Conjunctivae normal.     Pupils: Pupils are equal, round, and reactive to light.  Cardiovascular:     Rate and Rhythm: Regular rhythm. Bradycardia present.     Pulses: Normal pulses.     Heart sounds: Normal heart sounds. No murmur heard.   Pulmonary:     Effort: Pulmonary effort is normal. No respiratory distress.      Breath sounds: No wheezing, rhonchi or rales.  Musculoskeletal:     Cervical back: Normal range of motion.  Skin:    General: Skin is warm and dry.     Findings: No rash.  Neurological:     Mental Status: He is alert.       Assessment & Plan:    1. COPD with acute exacerbation ([place]) - HYDROcodone-homatropine (HYCODAN) 5-1.5 MG/5ML syrup; Take 5 mLs by mouth at bedtime as needed for cough.  Dispense: 60 mL; Refill: 0 - benzonatate (TESSALON) 100 MG capsule; Take 1 capsule (100 mg total) by mouth 2 (two) times daily as needed for cough.  Dispense: 20 capsule; Refill: 0 - amoxicillin-clavulanate (AUGMENTIN) 875-125 MG tablet; Take 1 tablet by mouth 2 (two) times daily.  Dispense: 20 tablet; Refill: 0  2. History of bradycardia  Pt has multiple allergies and interactions with other abx with tikosyn.  Will give augmentin.  For coughing- gave Tessalon perles and hycodan.  No wheezing on exam will hold off on albuterol and prednisone at this time. Cont with mucinex during the day.  Call if worsening or not improving in next 2-3 days. covid test pending. quarantine till results back.  Pt in agreement.  H/o bradycardia- stable. Cont to monitor.  Pt not having any symptoms.  F/u prn.

## 2020-10-22 LAB — SARS-COV-2, NAA 2 DAY TAT

## 2020-10-22 LAB — NOVEL CORONAVIRUS, NAA: SARS-CoV-2, NAA: DETECTED — AB

## 2020-10-22 LAB — SPECIMEN STATUS REPORT

## 2020-10-23 ENCOUNTER — Telehealth: Payer: Self-pay | Admitting: Adult Health

## 2020-10-23 NOTE — Telephone Encounter (Signed)
Called to discuss with patient about COVID-19 symptoms and the use of one of the available treatments for those with mild to moderate Covid symptoms and at a high risk of hospitalization.  Pt appears to qualify for outpatient treatment due to co-morbid conditions and/or a member of an at-risk group in accordance with the FDA Emergency Use Authorization.    Unable to reach pt - Left message to call back   Rexene Edison NP

## 2020-10-24 ENCOUNTER — Telehealth: Payer: Self-pay | Admitting: Infectious Diseases

## 2020-10-24 NOTE — Telephone Encounter (Signed)
Ok thank you so much!  Dr. Lovena Le

## 2020-10-24 NOTE — Telephone Encounter (Signed)
Called to discuss with patient about COVID-19 symptoms and the use of one of the available treatments for those with mild to moderate Covid symptoms and at a high risk of hospitalization.  Pt appears to qualify for outpatient treatment due to co-morbid conditions and/or a member of an at-risk group in accordance with the FDA Emergency Use Authorization.    Symptom onset: 10/07/2020 Vaccinated: yes  Booster? no Immunocompromised? no Qualifiers: age,   Has had a cold "worse and worse" since 2-10. He thinks his symptoms are getting better but cough won't go away. Non -productive. Does feel that the cough medicine helped.   He does not qualify for any acute COVID treatment at this time but would be a good person to consider referral to [place] clinic if cough persists beyond 3-4 weeks. Explained that this is something that can certainly persist for a few weeks. I asked him to keep his regular health care team updated on his condition.   Janene Madeira, NP

## 2020-10-25 ENCOUNTER — Telehealth: Payer: Self-pay | Admitting: Family Medicine

## 2020-10-25 ENCOUNTER — Telehealth: Payer: Self-pay | Admitting: Adult Health

## 2020-10-25 ENCOUNTER — Other Ambulatory Visit: Payer: Self-pay

## 2020-10-25 ENCOUNTER — Ambulatory Visit (HOSPITAL_COMMUNITY)
Admission: RE | Admit: 2020-10-25 | Discharge: 2020-10-25 | Disposition: A | Discharge status: Home / Self Care | Payer: Medicare Other | Source: Ambulatory Visit | Attending: Family Medicine | Admitting: Family Medicine

## 2020-10-25 DIAGNOSIS — J441 Chronic obstructive pulmonary disease with (acute) exacerbation: Secondary | ICD-10-CM | POA: Diagnosis not present

## 2020-10-25 DIAGNOSIS — R059 Cough, unspecified: Secondary | ICD-10-CM | POA: Diagnosis not present

## 2020-10-25 DIAGNOSIS — R0602 Shortness of breath: Secondary | ICD-10-CM | POA: Diagnosis not present

## 2020-10-25 DIAGNOSIS — U071 COVID-19: Secondary | ICD-10-CM

## 2020-10-25 MED ORDER — PREDNISONE 20 MG PO TABS: 40.0000 mg | ORAL_TABLET | Freq: Every day | ORAL | 0 refills | Status: DC

## 2020-10-25 NOTE — Telephone Encounter (Signed)
Yes we will order. Thx. Dr. Darene Lamer

## 2020-10-25 NOTE — Telephone Encounter (Signed)
Discussed with pt

## 2020-10-25 NOTE — Telephone Encounter (Signed)
Patient called to request that I order him a chest xray since he was diagnosed with covid 19.  I apologized to MR. Bechler, and let him know that our clinic specifically treats patients with acute mild to moderate COVID 19, and that xrays, and follow up for lingering COVID 19 symptoms should be completed with PCP, or the post-covid clinic.  I recommended that he f/u with his PCP, to get one ordered.    Wilber Bihari, NP

## 2020-10-25 NOTE — Telephone Encounter (Signed)
Pt states no fever now, feels worse than when seen. A lot of fatigue. States not really much sob, coughing a lot. Cough meds help some. Has a rattle in chest. Taking augmentin, tessalon and hycodan. States he will go get xray today.

## 2020-10-31 ENCOUNTER — Encounter: Payer: Self-pay | Admitting: Family Medicine

## 2020-11-01 ENCOUNTER — Encounter (HOSPITAL_COMMUNITY): Payer: Self-pay | Admitting: Physical Therapy

## 2020-11-01 ENCOUNTER — Other Ambulatory Visit: Payer: Self-pay

## 2020-11-01 ENCOUNTER — Ambulatory Visit (HOSPITAL_COMMUNITY): Payer: Medicare Other | Attending: Student | Admitting: Physical Therapy

## 2020-11-01 DIAGNOSIS — M545 Low back pain, unspecified: Secondary | ICD-10-CM | POA: Diagnosis not present

## 2020-11-01 DIAGNOSIS — M6281 Muscle weakness (generalized): Secondary | ICD-10-CM | POA: Diagnosis not present

## 2020-11-01 NOTE — Patient Instructions (Signed)
Access Code: [email or web address] URL: [email or web address] Date: 11/01/2020 Prepared by: Josue Hector  Exercises Supine Transversus Abdominis Bracing - Hands on Stomach - 2 x daily - 7 x weekly - 1-2 sets - 10 reps - 3 seconds hold Supine Straight Leg Raises - 2 x daily - 7 x weekly - 1-2 sets - 10 reps Supine Bridge - 2 x daily - 7 x weekly - 1-2 sets - 10 reps - seconds hold Sidelying Hip Abduction - 2 x daily - 7 x weekly - 1-2 sets - 10 reps Supine Lower Trunk Rotation - 2 x daily - 7 x weekly - 1 sets - 5 reps - 10 seconds hold

## 2020-11-01 NOTE — Therapy (Signed)
[name], [place], [address] Phone: [phone]   Fax:  [phone]  Physical Therapy Evaluation  Patient Details  Name: [name] MRN: [id number] Date of Birth: [birth year] Referring Provider (PT): Glenford Peers NP   Encounter Date: 11/01/2020   PT End of Session - 11/01/20 1131    Visit Number 1    Number of Visits 4    Date for PT Re-Evaluation 11/29/20    Authorization Type Medicare A/ AARP secondary (no auth req)    PT Start Time 1035    PT Stop Time 1125    PT Time Calculation (min) 50 min    Activity Tolerance Patient tolerated treatment well    Behavior During Therapy [facility] for tasks assessed/performed           Past Medical History:  Diagnosis Date   AAA (abdominal aortic aneurysm) ([place])    06/27/20 MRI: 3.0 cm infrarenal AAA, recommend 3 year follow-up   Anxiety    Atrial fibrillation ([place])    Bilateral carotid artery disease ([place]) 07/21/2013   CAD (coronary artery disease) 2008   CABG   COPD (chronic obstructive pulmonary disease) ([place])    Dysrhythmia    A-fib   Gastritis    Headache(784.0)    Hypertension    Visit for monitoring Tikosyn therapy 12/2015    Past Surgical History:  Procedure Laterality Date   CARDIOVERSION N/A 09/08/2015   Procedure: CARDIOVERSION;  Surgeon: Thayer Headings, MD;  Location: [place];  Service: Cardiovascular;  Laterality: N/A;   CORONARY ARTERY BYPASS GRAFT     EXCISION OF KELOID Left 10/08/2018   Procedure: EXCISION OF CHRONIC ABDOMINAL WALL WOUND;  Surgeon: Coralie Keens, MD;  Location: [place];  Service: General;  Laterality: Left;   INCISION AND DRAINAGE DEEP NECK ABSCESS     LUMBAR LAMINECTOMY/DECOMPRESSION MICRODISCECTOMY Right 08/09/2020   Procedure: Laminectomy and Foraminotomy - Lumbar three-Lumbar four - Lumbar four-Lumbar five- right;  Surgeon: Kary Kos, MD;  Location: [place];  Service: Neurosurgery;   Laterality: Right;   ROTATOR CUFF REPAIR Bilateral    TEE WITHOUT CARDIOVERSION N/A 08/02/2015   Procedure: TRANSESOPHAGEAL ECHOCARDIOGRAM (TEE);  Surgeon: Sueanne Margarita, MD;  Location: [facility] ENDOSCOPY;  Service: Cardiovascular;  Laterality: N/A;   TEE WITHOUT CARDIOVERSION N/A 09/08/2015   Procedure: TRANSESOPHAGEAL ECHOCARDIOGRAM (TEE);  Surgeon: Thayer Headings, MD;  Location: [place];  Service: Cardiovascular;  Laterality: N/A;    There were no vitals filed for this visit.    Subjective Assessment - 11/01/20 1047    Subjective Patient presents to physical therapy with complaint of low back pain and weakness. Patient had lumbar laminectomy and foramenectomy on 08/09/20. He says he was supposed to take therapy afterward but he got sick and had a bad cold for a month or so. He has been doing less since then and feels he has gotten weak and cannot build his muscles back up. His lumbar pain has improved since the surgery but he notes ongoing weakness and tension in low back area.    Pertinent History Lumbar laminectomy and foramenectomy 08/09/20    Limitations Standing;Walking;House hold activities;Lifting    Patient Stated Goals Try to get a little strength back    Currently in Pain? No/denies              [facility] PT Assessment - 11/01/20 0001      Assessment   Medical Diagnosis LBP    Referring  Provider (PT) Glenford Peers NP    Onset Date/Surgical Date 08/09/20    Prior Therapy No      Precautions   Precautions None      Restrictions   Weight Bearing Restrictions No      Balance Screen   Has the patient fallen in the past 6 months No      [place] residence    Living Arrangements Spouse/significant other      Prior Function   Level of Independence Independent      Cognition   Overall Cognitive Status Within Functional Limits for tasks assessed      Observation/Other Assessments   Focus on Therapeutic Outcomes (FOTO)  56%  function      Sensation   Light Touch Appears Intact      ROM / Strength   AROM / PROM / Strength AROM;Strength      AROM   AROM Assessment Site Lumbar    Lumbar Flexion 60% limited    Lumbar Extension 75% limited    Lumbar - Right Side Bend 50% limited    Lumbar - Left Side Bend 75% limited      Strength   Strength Assessment Site Hip;Knee;Ankle    Right/Left Hip Right;Left    Right Hip Flexion 5/5    Right Hip Extension 4+/5    Right Hip ABduction 4/5    Left Hip Flexion 4+/5    Left Hip Extension 4+/5    Left Hip ABduction 4-/5    Right/Left Knee Right;Left    Right Knee Extension 5/5    Left Knee Extension 4+/5    Right/Left Ankle Right;Left    Right Ankle Dorsiflexion 4+/5    Left Ankle Dorsiflexion 5/5      Flexibility   Soft Tissue Assessment /Muscle Length --   Min/ mod restriction in bilateral hamstrings     Palpation   Palpation comment No noted tenderness about lumbar paraspinals      Special Tests   Other special tests --   (-) SLR bilat     Ambulation/Gait   Ambulation/Gait Yes    Ambulation/Gait Assistance 7: Independent    Assistive device None    Gait Pattern Decreased step length - right;Decreased step length - left;Decreased stride length;Decreased hip/knee flexion - right;Decreased hip/knee flexion - left    Ambulation Surface Level;Indoor                      Objective measurements completed on examination: See above findings.       [facility] Adult PT Treatment/Exercise - 11/01/20 0001      Exercises   Exercises Lumbar      Lumbar Exercises: Stretches   Lower Trunk Rotation 3 reps;10 seconds      Lumbar Exercises: Supine   Ab Set 5 reps    Bridge 5 reps    Straight Leg Raise 5 reps      Lumbar Exercises: Sidelying   Hip Abduction 10 reps                  PT Education - 11/01/20 1051    Education Details on evaluation findings, POC and HEP    Person(s) Educated Patient    Methods Explanation;Handout     Comprehension Verbalized understanding            PT Short Term Goals - 11/01/20 1153      PT SHORT TERM GOAL #1   Title  Patient will be independent with initial HEP and self-management strategies to improve functional outcomes    Time 2    Period Weeks    Status New    Target Date 11/15/20             PT Long Term Goals - 11/01/20 1153      PT LONG TERM GOAL #1   Title Patient will improve FOTO score by 10% to indicate improvement in functional outcomes    Time 4    Period Weeks    Status New    Target Date 11/29/20      PT LONG TERM GOAL #2   Title Patient will be independent with advanced HEP and self-management strategies to improve functional outcomes    Time 4    Period Weeks    Status New    Target Date 11/29/20      PT LONG TERM GOAL #3   Title Patient will improve lumbar AROM by 25% in all restricted planes for improved ability to perform functional mobility tasks and ADLs.    Time 4    Period Weeks    Status New    Target Date 11/29/20      PT LONG TERM GOAL #4   Title Patient will report at least 70% overall improvement in subjective complaint to indicate improvement in ability to perform ADLs.    Time 4    Period Weeks    Status New    Target Date 11/29/20                  Plan - 11/01/20 1149    Clinical Impression Statement Patient is a 75 y.o. male who presents to physical therapy with complaint of LBP and weakness. Patient demonstrates decreased strength, ROM restriction, reduced flexibility and gait abnormalities which are likely contributing to symptoms of pain and are negatively impacting patient ability to perform ADLs and functional mobility tasks. Patient will benefit from skilled physical therapy services to address these deficits to reduce pain and improve level of function with ADLs and functional mobility tasks.    Personal Factors and Comorbidities Age    Examination-Activity Limitations Locomotion Level;Transfers;Carry;Stairs     Examination-Participation Restrictions Community Activity;Occupation;Yard Work    Stability/Clinical Decision Making Stable/Uncomplicated    Designer, jewellery Low    Rehab Potential Good    PT Frequency 1x / week    PT Duration 4 weeks    PT Treatment/Interventions ADLs/Self Care Home Management;Ultrasound;Neuromuscular re-education;Compression bandaging;Scar mobilization;Parrafin;Fluidtherapy;Contrast Bath;Aquatic Therapy;Biofeedback;DME Instruction;Patient/family education;Passive range of motion;Dry needling;Orthotic Fit/Training;Gait training;Canalith Repostioning;Stair training;Cryotherapy;Prosthetic Training;Functional mobility training;Manual techniques;Vasopneumatic Device;Therapeutic activities;Electrical Stimulation;Taping;Iontophoresis 4mg /ml Dexamethasone;Therapeutic exercise;Moist Heat;Traction;Balance training;Manual lymph drainage;Vestibular;Energy conservation;Splinting;Joint Manipulations;Spinal Manipulations;Visual/perceptual remediation/compensation    PT Next Visit Plan Review goals and HEP. Progress hip and core strength and lumbar mobility as tolerated. Add sit to stands, heel raises, hamstring stretches. Add balance when ready. Progress with weekly HEP.    PT Home Exercise Plan Eval: ab set, bridge, sidelying hip abduction, LTR, SLR    Consulted and Agree with Plan of Care Patient           Patient will benefit from skilled therapeutic intervention in order to improve the following deficits and impairments:  Abnormal gait,Decreased endurance,Hypomobility,Decreased activity tolerance,Decreased mobility,Pain,Increased fascial restricitons,Decreased strength,Impaired flexibility,Decreased range of motion  Visit Diagnosis: Low back pain, unspecified back pain laterality, unspecified chronicity, unspecified whether sciatica present  Muscle weakness (generalized)     Problem List Patient Active Problem List   Diagnosis Date Noted  History of bradycardia  10/21/2020   Spinal stenosis at L4-L5 level 08/09/2020   Insomnia 02/06/2018   Visit for monitoring Tikosyn therapy 01/10/2016   Atrial flutter ([place]) 08/07/2015   Bilateral carotid artery disease ([place]) 07/21/2013   CAD (coronary artery disease) of artery bypass graft 01/26/2013   Chronic obstructive pulmonary disease ([place]) 01/26/2013   Essential hypertension, benign 01/26/2013   Hyperlipidemia 01/26/2013    11:57 AM, 11/01/20 Josue Hector PT DPT  Physical Therapist with [facility]  [phone] [place], [place], [address] Phone: [phone]   Fax:  [phone]  Name: [name] MRN: [id number] Date of Birth: Feb 25, 1946

## 2020-11-09 ENCOUNTER — Other Ambulatory Visit: Payer: Self-pay | Admitting: Family Medicine

## 2020-11-09 DIAGNOSIS — F419 Anxiety disorder, unspecified: Secondary | ICD-10-CM

## 2020-11-09 NOTE — Telephone Encounter (Signed)
06/03/20 was last visit for anxiety

## 2020-11-09 NOTE — Telephone Encounter (Signed)
Pt needs appt for f/u on anxiety/insomnia.  D.r taylor

## 2020-11-10 ENCOUNTER — Ambulatory Visit (HOSPITAL_COMMUNITY): Payer: Medicare Other

## 2020-11-10 ENCOUNTER — Other Ambulatory Visit: Payer: Self-pay

## 2020-11-10 DIAGNOSIS — M545 Low back pain, unspecified: Secondary | ICD-10-CM

## 2020-11-10 DIAGNOSIS — M6281 Muscle weakness (generalized): Secondary | ICD-10-CM

## 2020-11-10 NOTE — Therapy (Signed)
[facility] [address] [place], [place], [address] Phone: [phone]   Fax:  [phone]  Physical Therapy Treatment  Patient Details  Name: [name] MRN: [id number] Date of Birth: [birth year] Referring Provider (PT): Glenford Peers NP   Encounter Date: 11/10/2020   PT End of Session - 11/10/20 1125    Visit Number 2    Number of Visits 4    Date for PT Re-Evaluation 11/29/20    Authorization Type Medicare A/ AARP secondary (no auth req)    PT Start Time 1119    PT Stop Time 1204    PT Time Calculation (min) 45 min    Activity Tolerance Patient tolerated treatment well    Behavior During Therapy [facility] for tasks assessed/performed           Past Medical History:  Diagnosis Date  . AAA (abdominal aortic aneurysm) ([place])    06/27/20 MRI: 3.0 cm infrarenal AAA, recommend 3 year follow-up  . Anxiety   . Atrial fibrillation ([place])   . Bilateral carotid artery disease ([place]) 07/21/2013  . CAD (coronary artery disease) 2008   CABG  . COPD (chronic obstructive pulmonary disease) ([place])   . Dysrhythmia    A-fib  . Gastritis   . Headache(784.0)   . Hypertension   . Visit for monitoring Tikosyn therapy 12/2015    Past Surgical History:  Procedure Laterality Date  . CARDIOVERSION N/A 09/08/2015   Procedure: CARDIOVERSION;  Surgeon: Thayer Headings, MD;  Location: [place];  Service: Cardiovascular;  Laterality: N/A;  . CORONARY ARTERY BYPASS GRAFT    . EXCISION OF KELOID Left 10/08/2018   Procedure: EXCISION OF CHRONIC ABDOMINAL WALL WOUND;  Surgeon: Coralie Keens, MD;  Location: [place];  Service: General;  Laterality: Left;  . INCISION AND DRAINAGE DEEP NECK ABSCESS    . LUMBAR LAMINECTOMY/DECOMPRESSION MICRODISCECTOMY Right 08/09/2020   Procedure: Laminectomy and Foraminotomy - Lumbar three-Lumbar four - Lumbar four-Lumbar five- right;  Surgeon: Kary Kos, MD;  Location: [place];  Service: Neurosurgery;   Laterality: Right;  . ROTATOR CUFF REPAIR Bilateral   . TEE WITHOUT CARDIOVERSION N/A 08/02/2015   Procedure: TRANSESOPHAGEAL ECHOCARDIOGRAM (TEE);  Surgeon: Sueanne Margarita, MD;  Location: [facility] ENDOSCOPY;  Service: Cardiovascular;  Laterality: N/A;  . TEE WITHOUT CARDIOVERSION N/A 09/08/2015   Procedure: TRANSESOPHAGEAL ECHOCARDIOGRAM (TEE);  Surgeon: Thayer Headings, MD;  Location: [place];  Service: Cardiovascular;  Laterality: N/A;    There were no vitals filed for this visit.   Subjective Assessment - 11/10/20 1124    Subjective Reports feeling better with minimal pain in his back and none experienced in his LE.  Chief complaint is feeling weak and fatigue in lumbar spine    Pertinent History Lumbar laminectomy and foramenectomy 08/09/20    Limitations Standing;Walking;House hold activities;Lifting    Patient Stated Goals Try to get a little strength back              [facility] PT Assessment - 11/10/20 0001      Assessment   Medical Diagnosis LBP    Referring Provider (PT) Glenford Peers NP    Onset Date/Surgical Date 08/09/20                         [facility] Adult PT Treatment/Exercise - 11/10/20 0001      Lumbar Exercises: Stretches   Lower Trunk Rotation Other (comment)   2 min  Lumbar Exercises: Supine   Ab Set 15 reps;3 seconds    Bridge 10 reps    Straight Leg Raise 10 reps      Lumbar Exercises: Sidelying   Hip Abduction Both;15 reps      Lumbar Exercises: Quadruped   [place] 20 reps    Straight Leg Raise 10 reps;3 seconds   (hip extension)   Other Quadruped Lumbar Exercises Child's pose x 2 min                  PT Education - 11/10/20 1204    Education Details education on HEP developments and rationale for progress of core strengthening exercises    Person(s) Educated Patient    Methods Explanation    Comprehension Verbalized understanding            PT Short Term Goals - 11/01/20 1153      PT SHORT TERM GOAL #1    Title Patient will be independent with initial HEP and self-management strategies to improve functional outcomes    Time 2    Period Weeks    Status New    Target Date 11/15/20             PT Long Term Goals - 11/01/20 1153      PT LONG TERM GOAL #1   Title Patient will improve FOTO score by 10% to indicate improvement in functional outcomes    Time 4    Period Weeks    Status New    Target Date 11/29/20      PT LONG TERM GOAL #2   Title Patient will be independent with advanced HEP and self-management strategies to improve functional outcomes    Time 4    Period Weeks    Status New    Target Date 11/29/20      PT LONG TERM GOAL #3   Title Patient will improve lumbar AROM by 25% in all restricted planes for improved ability to perform functional mobility tasks and ADLs.    Time 4    Period Weeks    Status New    Target Date 11/29/20      PT LONG TERM GOAL #4   Title Patient will report at least 70% overall improvement in subjective complaint to indicate improvement in ability to perform ADLs.    Time 4    Period Weeks    Status New    Target Date 11/29/20                 Plan - 11/10/20 1132    Clinical Impression Statement Demonstrating good execution of initial HEP activities requiring verbal cues 25% of the time for proper sequence and form.  Tolerating increased resistance activities well without increase in pain. Progress core strengthening/stabilization exercises to facilitate dynamic activities and functional lifts    Personal Factors and Comorbidities Age    Examination-Activity Limitations Locomotion Level;Transfers;Carry;Stairs    Examination-Participation Restrictions Community Activity;Occupation;Yard Work    Stability/Clinical Decision Making Stable/Uncomplicated    Rehab Potential Good    PT Frequency 1x / week    PT Duration 4 weeks    PT Treatment/Interventions ADLs/Self Care Home Management;Ultrasound;Neuromuscular  re-education;Compression bandaging;Scar mobilization;Parrafin;Fluidtherapy;Contrast Bath;Aquatic Therapy;Biofeedback;DME Instruction;Patient/family education;Passive range of motion;Dry needling;Orthotic Fit/Training;Gait training;Canalith Repostioning;Stair training;Cryotherapy;Prosthetic Training;Functional mobility training;Manual techniques;Vasopneumatic Device;Therapeutic activities;Electrical Stimulation;Taping;Iontophoresis 4mg /ml Dexamethasone;Therapeutic exercise;Moist Heat;Traction;Balance training;Manual lymph drainage;Vestibular;Energy conservation;Splinting;Joint Manipulations;Spinal Manipulations;Visual/perceptual remediation/compensation    PT Next Visit Plan Review goals and HEP. Progress hip and core strength and lumbar mobility  as tolerated. Add sit to stands, heel raises, hamstring stretches. Add balance when ready. Progress with weekly HEP.    PT Home Exercise Plan Eval: ab set, bridge, sidelying hip abduction, LTR, SLR    Consulted and Agree with Plan of Care Patient           Patient will benefit from skilled therapeutic intervention in order to improve the following deficits and impairments:  Abnormal gait,Decreased endurance,Hypomobility,Decreased activity tolerance,Decreased mobility,Pain,Increased fascial restricitons,Decreased strength,Impaired flexibility,Decreased range of motion  Visit Diagnosis: Low back pain, unspecified back pain laterality, unspecified chronicity, unspecified whether sciatica present  Muscle weakness (generalized)     Problem List Patient Active Problem List   Diagnosis Date Noted  . History of bradycardia 10/21/2020  . Spinal stenosis at L4-L5 level 08/09/2020  . Insomnia 02/06/2018  . Visit for monitoring Tikosyn therapy 01/10/2016  . Atrial flutter ([place]) 08/07/2015  . Bilateral carotid artery disease ([place]) 07/21/2013  . CAD (coronary artery disease) of artery bypass graft 01/26/2013  . Chronic obstructive pulmonary disease ([place])  01/26/2013  . Essential hypertension, benign 01/26/2013  . Hyperlipidemia 01/26/2013   12:07 PM, 11/10/20 M. Sherlyn Lees, PT, DPT Physical Therapist- [place] Office Number: [phone]  [facility] [address] [place], [place], [address] Phone: [phone]   Fax:  [phone]  Name: [name] MRN: [id number] Date of Birth: February 16, 1946

## 2020-11-10 NOTE — Patient Instructions (Signed)
Access Code: [email or web address] URL: [email or web address] Date: 11/10/2020 Prepared by: Sherlyn Lees  Exercises Beginner Front Arm Support - 1 x daily - 4-5 x weekly - 3 sets - 10 reps - 3 sec hold Child's Pose Stretch - 7 x weekly Cat-Camel to Child's Pose - 1 x daily - 7 x weekly - 3 sets - 10 reps

## 2020-11-11 NOTE — Telephone Encounter (Signed)
Patient has medication follow up on 3/22

## 2020-11-16 ENCOUNTER — Ambulatory Visit: Payer: Medicare Other | Admitting: Family Medicine

## 2020-11-17 ENCOUNTER — Ambulatory Visit (HOSPITAL_COMMUNITY): Payer: Medicare Other | Admitting: Physical Therapy

## 2020-11-22 ENCOUNTER — Ambulatory Visit: Payer: Medicare Other | Admitting: Family Medicine

## 2020-11-24 ENCOUNTER — Ambulatory Visit (HOSPITAL_COMMUNITY): Payer: Medicare Other

## 2020-12-01 ENCOUNTER — Ambulatory Visit (HOSPITAL_COMMUNITY): Payer: Medicare Other | Attending: Student | Admitting: Physical Therapy

## 2020-12-01 ENCOUNTER — Other Ambulatory Visit: Payer: Self-pay

## 2020-12-01 ENCOUNTER — Encounter (HOSPITAL_COMMUNITY): Payer: Self-pay | Admitting: Physical Therapy

## 2020-12-01 DIAGNOSIS — M545 Low back pain, unspecified: Secondary | ICD-10-CM | POA: Diagnosis not present

## 2020-12-01 DIAGNOSIS — M6281 Muscle weakness (generalized): Secondary | ICD-10-CM | POA: Insufficient documentation

## 2020-12-01 NOTE — Therapy (Addendum)
[facility] [address] [place], [place], [address] Phone: [phone]   Fax:  [phone]  Physical Therapy Treatment  Patient Details  Name: [name] MRN: [id number] Date of Birth: [birth year] Referring Provider (PT): Glenford Peers NP  PHYSICAL THERAPY DISCHARGE SUMMARY  Visits from Start of Care: 3  Current functional level related to goals / functional outcomes: See below   Remaining deficits: See below     Education / Equipment: See assessment Plan: Patient agrees to discharge.  Patient goals were partially met. Patient is being discharged due to being pleased with the current functional level.  ?????      Encounter Date: 12/01/2020   PT End of Session - 12/01/20 1127    Visit Number 3    Number of Visits 4    Date for PT Re-Evaluation 12/01/20    Authorization Type Medicare A/ AARP secondary (no auth req)    PT Start Time 1120    PT Stop Time 1205    PT Time Calculation (min) 45 min    Activity Tolerance Patient tolerated treatment well    Behavior During Therapy WFL for tasks assessed/performed           Past Medical History:  Diagnosis Date  . AAA (abdominal aortic aneurysm) ([place])    06/27/20 MRI: 3.0 cm infrarenal AAA, recommend 3 year follow-up  . Anxiety   . Atrial fibrillation ([place])   . Bilateral carotid artery disease ([place]) 07/21/2013  . CAD (coronary artery disease) 2008   CABG  . COPD (chronic obstructive pulmonary disease) ([place])   . Dysrhythmia    A-fib  . Gastritis   . Headache(784.0)   . Hypertension   . Visit for monitoring Tikosyn therapy 12/2015    Past Surgical History:  Procedure Laterality Date  . CARDIOVERSION N/A 09/08/2015   Procedure: CARDIOVERSION;  Surgeon: Thayer Headings, MD;  Location: [facility];  Service: Cardiovascular;  Laterality: N/A;  . CORONARY ARTERY BYPASS GRAFT    . EXCISION OF KELOID Left 10/08/2018   Procedure: EXCISION OF CHRONIC ABDOMINAL WALL WOUND;   Surgeon: Coralie Keens, MD;  Location: [facility];  Service: General;  Laterality: Left;  . INCISION AND DRAINAGE DEEP NECK ABSCESS    . LUMBAR LAMINECTOMY/DECOMPRESSION MICRODISCECTOMY Right 08/09/2020   Procedure: Laminectomy and Foraminotomy - Lumbar three-Lumbar four - Lumbar four-Lumbar five- right;  Surgeon: Kary Kos, MD;  Location: [facility];  Service: Neurosurgery;  Laterality: Right;  . ROTATOR CUFF REPAIR Bilateral   . TEE WITHOUT CARDIOVERSION N/A 08/02/2015   Procedure: TRANSESOPHAGEAL ECHOCARDIOGRAM (TEE);  Surgeon: Sueanne Margarita, MD;  Location: [facility] ENDOSCOPY;  Service: Cardiovascular;  Laterality: N/A;  . TEE WITHOUT CARDIOVERSION N/A 09/08/2015   Procedure: TRANSESOPHAGEAL ECHOCARDIOGRAM (TEE);  Surgeon: Thayer Headings, MD;  Location: [facility];  Service: Cardiovascular;  Laterality: N/A;    There were no vitals filed for this visit.   Subjective Assessment - 12/01/20 1123    Subjective Patient says his back is doing better. He says some time last week or so his back was sore from being bent over working on a water heater for several hours. He says he has since worked a lot of this out. He reports at least 75% improvement since starting therapy.    Pertinent History Lumbar laminectomy and foramenectomy 08/09/20    Limitations Standing;Walking;House hold activities;Lifting    Patient Stated Goals Try to get a little strength back    Currently in Pain?  No/denies              [facility] PT Assessment - 12/01/20 0001      Assessment   Medical Diagnosis LBP    Referring Provider (PT) Glenford Peers NP    Onset Date/Surgical Date 08/09/20      Observation/Other Assessments   Focus on Therapeutic Outcomes (FOTO)  72% function   was 56%     AROM   Lumbar Flexion 50% limited   was 60%   Lumbar Extension 75% limited   same   Lumbar - Right Side Bend WFL   was 50%   Lumbar - Left Side Bend 15% limited   was 75%                        [facility]  Adult PT Treatment/Exercise - 12/01/20 0001      Lumbar Exercises: Stretches   Lower Trunk Rotation 5 reps;10 seconds      Lumbar Exercises: Supine   Ab Set 10 reps;5 seconds    Clam 10 reps    Clam Limitations GTB    Bent Knee Raise 10 reps    Bridge 10 reps;5 seconds    Straight Leg Raise 10 reps                    PT Short Term Goals - 12/01/20 1152      PT SHORT TERM GOAL #1   Title Patient will be independent with initial HEP and self-management strategies to improve functional outcomes    Baseline Demos good return    Time 2    Period Weeks    Status Achieved    Target Date 11/15/20             PT Long Term Goals - 12/01/20 1152      PT LONG TERM GOAL #1   Title Patient will improve FOTO score by 10% to indicate improvement in functional outcomes    Baseline Improved by 16%    Time 4    Period Weeks    Status Achieved      PT LONG TERM GOAL #2   Title Patient will be independent with advanced HEP and self-management strategies to improve functional outcomes    Baseline Reviewed HEP and issued updated handout    Time 4    Period Weeks    Status Achieved      PT LONG TERM GOAL #3   Title Patient will improve lumbar AROM by 25% in all restricted planes for improved ability to perform functional mobility tasks and ADLs.    Baseline See AROM    Time 4    Period Weeks    Status Partially Met      PT LONG TERM GOAL #4   Title Patient will report at least 70% overall improvement in subjective complaint to indicate improvement in ability to perform ADLs.    Baseline Reports 75%    Time 4    Period Weeks    Status Achieved                 Plan - 12/01/20 1211    Clinical Impression Statement Patient shows good progress and has currently met all therapy goals except remains slightly limited by lumbar AROM. Patient shows improved functional ability and is better with activity tolerance. Patient reporting no pain at baseline and that he is able  to perform ADLs and work duties with little to no  issues. Reviewed HEP and exercise progressions and issued updated HEP handout. Patient DC today to transition to independent with home exercise. Patient encouraged to follow up with therapy services with any further questions or concerns.    Personal Factors and Comorbidities Age    Examination-Activity Limitations Locomotion Level;Transfers;Carry;Stairs    Examination-Participation Restrictions Community Activity;Occupation;Yard Work    Stability/Clinical Decision Making Stable/Uncomplicated    Rehab Potential Good    PT Treatment/Interventions ADLs/Self Care Home Management;Ultrasound;Neuromuscular re-education;Compression bandaging;Scar mobilization;Parrafin;Fluidtherapy;Contrast Bath;Aquatic Therapy;Biofeedback;DME Instruction;Patient/family education;Passive range of motion;Dry needling;Orthotic Fit/Training;Gait training;Canalith Repostioning;Stair training;Cryotherapy;Prosthetic Training;Functional mobility training;Manual techniques;Vasopneumatic Device;Therapeutic activities;Electrical Stimulation;Taping;Iontophoresis 44m/ml Dexamethasone;Therapeutic exercise;Moist Heat;Traction;Balance training;Manual lymph drainage;Vestibular;Energy conservation;Splinting;Joint Manipulations;Spinal Manipulations;Visual/perceptual remediation/compensation    PT Next Visit Plan DC to HEP    PT Home Exercise Plan Eval: ab set, bridge, sidelying hip abduction, LTR, SLR 4/6 clams, ab march    Consulted and Agree with Plan of Care Patient           Patient will benefit from skilled therapeutic intervention in order to improve the following deficits and impairments:  Abnormal gait,Decreased endurance,Hypomobility,Decreased activity tolerance,Decreased mobility,Pain,Increased fascial restricitons,Decreased strength,Impaired flexibility,Decreased range of motion  Visit Diagnosis: Low back pain, unspecified back pain laterality, unspecified chronicity, unspecified  whether sciatica present  Muscle weakness (generalized)     Problem List Patient Active Problem List   Diagnosis Date Noted  . History of bradycardia 10/21/2020  . Spinal stenosis at L4-L5 level 08/09/2020  . Insomnia 02/06/2018  . Visit for monitoring Tikosyn therapy 01/10/2016  . Atrial flutter ([place] 08/07/2015  . Bilateral carotid artery disease ([place] 07/21/2013  . CAD (coronary artery disease) of artery bypass graft 01/26/2013  . Chronic obstructive pulmonary disease ([place] 01/26/2013  . Essential hypertension, benign 01/26/2013  . Hyperlipidemia 01/26/2013   12:13 PM, 12/01/20 CJosue HectorPT DPT  Physical [facility] [phone] [address]Phone: [phone]  Fax:  [phone] Name: Ian VICTORIOMRN: [id number] of Birth: 205-13-1947

## 2020-12-01 NOTE — Patient Instructions (Signed)
Access Code: [email or web address] URL: [email or web address] Date: 12/01/2020 Prepared by: Josue Hector  Exercises Supine March - 1 x daily - 4 x weekly - 2 sets - 10 reps Hooklying Clamshell with Resistance - 1 x daily - 4 x weekly - 2 sets - 10 reps - 5 seconds hold

## 2020-12-06 DIAGNOSIS — I482 Chronic atrial fibrillation, unspecified: Secondary | ICD-10-CM | POA: Diagnosis not present

## 2020-12-06 DIAGNOSIS — M25551 Pain in right hip: Secondary | ICD-10-CM | POA: Diagnosis not present

## 2020-12-06 DIAGNOSIS — F411 Generalized anxiety disorder: Secondary | ICD-10-CM | POA: Diagnosis not present

## 2020-12-06 DIAGNOSIS — I10 Essential (primary) hypertension: Secondary | ICD-10-CM | POA: Diagnosis not present

## 2020-12-06 DIAGNOSIS — Z0189 Encounter for other specified special examinations: Secondary | ICD-10-CM | POA: Diagnosis not present

## 2020-12-06 DIAGNOSIS — I25810 Atherosclerosis of coronary artery bypass graft(s) without angina pectoris: Secondary | ICD-10-CM | POA: Diagnosis not present

## 2020-12-06 DIAGNOSIS — E782 Mixed hyperlipidemia: Secondary | ICD-10-CM | POA: Diagnosis not present

## 2020-12-13 DIAGNOSIS — Z125 Encounter for screening for malignant neoplasm of prostate: Secondary | ICD-10-CM | POA: Diagnosis not present

## 2020-12-13 DIAGNOSIS — Z0189 Encounter for other specified special examinations: Secondary | ICD-10-CM | POA: Diagnosis not present

## 2020-12-13 DIAGNOSIS — E782 Mixed hyperlipidemia: Secondary | ICD-10-CM | POA: Diagnosis not present

## 2020-12-13 DIAGNOSIS — I25810 Atherosclerosis of coronary artery bypass graft(s) without angina pectoris: Secondary | ICD-10-CM | POA: Diagnosis not present

## 2020-12-13 DIAGNOSIS — F411 Generalized anxiety disorder: Secondary | ICD-10-CM | POA: Diagnosis not present

## 2020-12-13 DIAGNOSIS — I10 Essential (primary) hypertension: Secondary | ICD-10-CM | POA: Diagnosis not present

## 2020-12-13 DIAGNOSIS — R7301 Impaired fasting glucose: Secondary | ICD-10-CM | POA: Diagnosis not present

## 2020-12-13 DIAGNOSIS — Z712 Person consulting for explanation of examination or test findings: Secondary | ICD-10-CM | POA: Diagnosis not present

## 2020-12-22 ENCOUNTER — Other Ambulatory Visit: Payer: Self-pay | Admitting: Family Medicine

## 2020-12-23 DIAGNOSIS — M25551 Pain in right hip: Secondary | ICD-10-CM | POA: Diagnosis not present

## 2020-12-23 DIAGNOSIS — E782 Mixed hyperlipidemia: Secondary | ICD-10-CM | POA: Diagnosis not present

## 2020-12-23 DIAGNOSIS — R800 Isolated proteinuria: Secondary | ICD-10-CM | POA: Diagnosis not present

## 2020-12-23 DIAGNOSIS — Z0189 Encounter for other specified special examinations: Secondary | ICD-10-CM | POA: Diagnosis not present

## 2020-12-23 DIAGNOSIS — R7303 Prediabetes: Secondary | ICD-10-CM | POA: Diagnosis not present

## 2020-12-23 DIAGNOSIS — I482 Chronic atrial fibrillation, unspecified: Secondary | ICD-10-CM | POA: Diagnosis not present

## 2020-12-23 DIAGNOSIS — F411 Generalized anxiety disorder: Secondary | ICD-10-CM | POA: Diagnosis not present

## 2020-12-23 DIAGNOSIS — I25810 Atherosclerosis of coronary artery bypass graft(s) without angina pectoris: Secondary | ICD-10-CM | POA: Diagnosis not present

## 2020-12-23 DIAGNOSIS — I10 Essential (primary) hypertension: Secondary | ICD-10-CM | POA: Diagnosis not present

## 2021-01-14 ENCOUNTER — Other Ambulatory Visit (HOSPITAL_COMMUNITY): Payer: Self-pay | Admitting: Nurse Practitioner

## 2021-01-14 DIAGNOSIS — I480 Paroxysmal atrial fibrillation: Secondary | ICD-10-CM

## 2021-01-18 DIAGNOSIS — I10 Essential (primary) hypertension: Secondary | ICD-10-CM | POA: Diagnosis not present

## 2021-01-18 DIAGNOSIS — M5126 Other intervertebral disc displacement, lumbar region: Secondary | ICD-10-CM | POA: Diagnosis not present

## 2021-01-20 DIAGNOSIS — I809 Phlebitis and thrombophlebitis of unspecified site: Secondary | ICD-10-CM | POA: Diagnosis not present

## 2021-01-25 ENCOUNTER — Other Ambulatory Visit: Payer: Self-pay | Admitting: Family Medicine

## 2021-01-25 DIAGNOSIS — I10 Essential (primary) hypertension: Secondary | ICD-10-CM

## 2021-03-10 ENCOUNTER — Other Ambulatory Visit: Payer: Self-pay

## 2021-03-10 ENCOUNTER — Encounter (HOSPITAL_COMMUNITY): Payer: Self-pay | Admitting: Nurse Practitioner

## 2021-03-10 ENCOUNTER — Ambulatory Visit (HOSPITAL_COMMUNITY)
Admission: RE | Admit: 2021-03-10 | Discharge: 2021-03-10 | Disposition: A | Discharge status: Home / Self Care | Payer: Medicare Other | Source: Ambulatory Visit | Attending: Nurse Practitioner | Admitting: Nurse Practitioner

## 2021-03-10 VITALS — BP 168/84 | HR 56 | Ht 65.0 in | Wt 146.6 lb

## 2021-03-10 DIAGNOSIS — Z79899 Other long term (current) drug therapy: Secondary | ICD-10-CM | POA: Diagnosis not present

## 2021-03-10 DIAGNOSIS — I2510 Atherosclerotic heart disease of native coronary artery without angina pectoris: Secondary | ICD-10-CM | POA: Diagnosis not present

## 2021-03-10 DIAGNOSIS — J449 Chronic obstructive pulmonary disease, unspecified: Secondary | ICD-10-CM | POA: Diagnosis not present

## 2021-03-10 DIAGNOSIS — Z8616 Personal history of COVID-19: Secondary | ICD-10-CM | POA: Insufficient documentation

## 2021-03-10 DIAGNOSIS — Z7901 Long term (current) use of anticoagulants: Secondary | ICD-10-CM | POA: Diagnosis not present

## 2021-03-10 DIAGNOSIS — D6869 Other thrombophilia: Secondary | ICD-10-CM | POA: Diagnosis not present

## 2021-03-10 DIAGNOSIS — I10 Essential (primary) hypertension: Secondary | ICD-10-CM | POA: Diagnosis not present

## 2021-03-10 DIAGNOSIS — I480 Paroxysmal atrial fibrillation: Secondary | ICD-10-CM

## 2021-03-10 DIAGNOSIS — Z951 Presence of aortocoronary bypass graft: Secondary | ICD-10-CM | POA: Diagnosis not present

## 2021-03-10 DIAGNOSIS — Z87891 Personal history of nicotine dependence: Secondary | ICD-10-CM | POA: Insufficient documentation

## 2021-03-10 DIAGNOSIS — Z8249 Family history of ischemic heart disease and other diseases of the circulatory system: Secondary | ICD-10-CM | POA: Diagnosis not present

## 2021-03-10 LAB — BASIC METABOLIC PANEL
Anion gap: 4 — ABNORMAL LOW (ref 5–15)
BUN: 13 mg/dL (ref 8–23)
CO2: 27 mmol/L (ref 22–32)
Calcium: 9.1 mg/dL (ref 8.9–10.3)
Chloride: 107 mmol/L (ref 98–111)
Creatinine, Ser: 1.23 mg/dL (ref 0.61–1.24)
GFR, Estimated: >60 mL/min (ref >60)
Glucose, Bld: 91 mg/dL (ref 70–99)
Potassium: 4.7 mmol/L (ref 3.5–5.1)
Sodium: 138 mmol/L (ref 135–145)

## 2021-03-10 LAB — MAGNESIUM: Magnesium: 2.3 mg/dL (ref 1.7–2.4)

## 2021-03-10 NOTE — Addendum Note (Signed)
Encounter addended by: Enid Derry, CMA on: 03/10/2021 11:38 AM  Actions taken: Order Reconciliation Section accessed

## 2021-03-10 NOTE — Progress Notes (Addendum)
Patient ID: [name], male   DOB: 11-22-1945, 75 y.o.   MRN: [id number]     Primary Care Physician: No primary care provider on file. Referring Physician:Dr. Porter Moes is a 75 y.o. male with a h/o PAF that underwent tikosyn loading at [facility] 01/10/16 . He has held in [place] very nicely . His grandson dies last week and after the service did noticed some fast irregular heart besat but only lasted around one hour.   No change form his usual health. 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 Tries to stay hydrated. Has gotten both covid shots.   BP elevated today but at home systolic is around 622-297 systolic. Just took meds about an hour ago. Heart rate is in the upper 40's today. Not symptomatic with this. He is not on AV nodal drugs. He sees HR 's in the 60's at home.    F/u in afib clinic, 09/10/19. He is s/p decompressive laminectomy  12/13. He is recovering nicely. He is staying in [place]. Qt stable. He is continuing on Tikosyn 500 mcg bid.   F/u in afib clinic, 03/08/21 for Tikosyn surveillance. He reports that he is staying in rhythm. Continues on Tikosyn. No issues with xarelto 20 mg daily for a CHA2DS2VASc  of 4. He had back surgery in December with relief of pain and had covid in February 2022 and did not require hospitalization. Mostly had cough. No pneumonia by CXR.    Today, he denies symptoms of palpitations, chest pain, shortness of breath, orthopnea, PND, lower extremity edema, dizziness, presyncope, syncope, or neurologic sequela. The patient is tolerating medications without difficulties and is otherwise without complaint today.   Past Medical History:  Diagnosis Date   AAA (abdominal aortic aneurysm) ([place])    06/27/20 MRI: 3.0 cm infrarenal AAA, recommend 3 year follow-up   Anxiety    Atrial fibrillation ([place])    Bilateral carotid artery disease ([place]) 07/21/2013   CAD (coronary artery disease) 2008   CABG   COPD (chronic obstructive  pulmonary disease) ([facility])    Dysrhythmia    A-fib   Gastritis    Headache(784.0)    Hypertension    Visit for monitoring Tikosyn therapy 12/2015   Past Surgical History:  Procedure Laterality Date   CARDIOVERSION N/A 09/08/2015   Procedure: CARDIOVERSION;  Surgeon: Thayer Headings, MD;  Location: [facility];  Service: Cardiovascular;  Laterality: N/A;   CORONARY ARTERY BYPASS GRAFT     EXCISION OF KELOID Left 10/08/2018   Procedure: EXCISION OF CHRONIC ABDOMINAL WALL WOUND;  Surgeon: Coralie Keens, MD;  Location: [facility];  Service: General;  Laterality: Left;   INCISION AND DRAINAGE DEEP NECK ABSCESS     LUMBAR LAMINECTOMY/DECOMPRESSION MICRODISCECTOMY Right 08/09/2020   Procedure: Laminectomy and Foraminotomy - Lumbar three-Lumbar four - Lumbar four-Lumbar five- right;  Surgeon: Kary Kos, MD;  Location: [facility];  Service: Neurosurgery;  Laterality: Right;   ROTATOR CUFF REPAIR Bilateral    TEE WITHOUT CARDIOVERSION N/A 08/02/2015   Procedure: TRANSESOPHAGEAL ECHOCARDIOGRAM (TEE);  Surgeon: Sueanne Margarita, MD;  Location: [facility] ENDOSCOPY;  Service: Cardiovascular;  Laterality: N/A;   TEE WITHOUT CARDIOVERSION N/A 09/08/2015   Procedure: TRANSESOPHAGEAL ECHOCARDIOGRAM (TEE);  Surgeon: Thayer Headings, MD;  Location: [facility] ENDOSCOPY;  Service: Cardiovascular;  Laterality: N/A;    Current Outpatient Medications  Medication Sig Dispense Refill   ALPRAZolam (XANAX) 0.25 MG tablet TAKE 1 TABLET(0.25 MG)  BY MOUTH DAILY 10 tablet 0   amLODipine (NORVASC) 5 MG tablet Take 1 tablet (5 mg total) by mouth daily. 30 tablet 6   atorvastatin (LIPITOR) 40 MG tablet TAKE 1 TABLET(40 MG) BY MOUTH DAILY 90 tablet 1   diltiazem (CARDIZEM) 30 MG tablet Take 1 tablet every 4 hours AS NEEDED for heart rate >100 as long as blood pressure >100. 45 tablet 1   dofetilide (TIKOSYN) 500 MCG capsule TAKE 1 CAPSULE(500 MCG) BY MOUTH TWICE DAILY 180 capsule 1   gabapentin (NEURONTIN) 300 MG capsule Take 1  capsule (300 mg total) by mouth at bedtime. 30 capsule 1   lisinopril (ZESTRIL) 20 MG tablet TAKE 1 TABLET(20 MG) BY MOUTH DAILY 90 tablet 1   Multiple Vitamins-Minerals (MULTIVITAMIN WITH MINERALS) tablet Take 1 tablet by mouth at bedtime.      Omega-3 Fatty Acids (FISH OIL) 1000 MG CPDR Take 1,000 mg by mouth at bedtime.      penicillin v potassium (VEETID) 500 MG tablet Take 500 mg by mouth 3 (three) times daily.     potassium chloride (KLOR-CON) 10 MEQ tablet TAKE 1 TABLET(10 MEQ) BY MOUTH DAILY 90 tablet 0   vitamin C (ASCORBIC ACID) 500 MG tablet Take 500 mg by mouth at bedtime.      XARELTO 20 MG TABS tablet TAKE 1 TABLET(20 MG) BY MOUTH DAILY WITH SUPPER 30 tablet 3   No current facility-administered medications for this encounter.    Allergies  Allergen Reactions   Doxycycline Shortness Of Breath    Also has to urinate more often   Nsaids Hypertension    Gastritis     Social History   Socioeconomic History   Marital status: Married    Spouse name: Not on file   Number of children: Not on file   Years of education: Not on file   Highest education level: Not on file  Occupational History   Not on file  Tobacco Use   Smoking status: Former   Smokeless tobacco: Former    Types: Nurse, children's Use: Never used  Substance and Sexual Activity   Alcohol use: No    Comment: quit drinking in the 70's   Drug use: No   Sexual activity: Not on file  Other Topics Concern   Not on file  Social History Narrative   Not on file   Social Determinants of Health   Financial Resource Strain: Not on file  Food Insecurity: Not on file  Transportation Needs: Not on file  Physical Activity: Not on file  Stress: Not on file  Social Connections: Not on file  Intimate Partner Violence: Not on file    Family History  Problem Relation Age of Onset   Diabetes Mother    Hypertension Mother    Heart attack Father    Sudden death Father    Hypertension Sister     Diabetes Sister     ROS- All systems are reviewed and negative except as per the HPI above  Physical Exam: Vitals:   03/10/21 0926  BP: (!) 168/84  Pulse: (!) 56  Weight: 66.5 kg  Height: 5\' 5"  (1.651 m)    GEN- The patient is well appearing, alert and oriented x 3 today.   Head- normocephalic, atraumatic Eyes-  Sclera clear, conjunctiva pink Ears- hearing intact Oropharynx- clear Neck- supple, no JVP Lymph- no cervical lymphadenopathy Lungs- Clear to ausculation bilaterally, normal work of breathing Heart- Regular rate and rhythm, no  murmurs, rubs or gallops, PMI not laterally displaced GI- soft, NT, ND, + BS Extremities- no clubbing, cyanosis, or edema MS- no significant deformity or atrophy Skin- no rash or lesion Psych- euthymic mood, full affect Neuro- strength and sensation are intact  EKG- NSR at 66 bpm, qtc stable  Epic records reviewed  Assessment and Plan: 1. Paroxysmal atrial fibrillation Has been staying in [place]  Continue tikosyn 500 mg bid Continue xarelto 20 mg daily with CHA2DS2VASc score of 2  Bmet/mag today   2. H/o Carotid artery disease  Per Dr. Marlou Porch  3. HTN Elevated on presentation,states he only takes amlodipine 5 mg if BP over 140 in the evenings, takes maybe 4-5 times a week Try to take 2.5 mg amlodipine daily  F/u in 6 months    Butch Penny C. Moriah Shawley, [facility] [address] [place], [place] [address] [phone]

## 2021-03-28 ENCOUNTER — Other Ambulatory Visit (HOSPITAL_COMMUNITY): Payer: Self-pay

## 2021-03-28 MED ORDER — DOFETILIDE 500 MCG PO CAPS: 500.0000 ug | ORAL_CAPSULE | Freq: Two times a day (BID) | ORAL | 2 refills | Status: DC

## 2021-05-18 ENCOUNTER — Other Ambulatory Visit (HOSPITAL_COMMUNITY): Payer: Self-pay | Admitting: Nurse Practitioner

## 2021-05-18 DIAGNOSIS — I480 Paroxysmal atrial fibrillation: Secondary | ICD-10-CM

## 2021-05-21 ENCOUNTER — Emergency Department (HOSPITAL_COMMUNITY)
Admission: EM | Admit: 2021-05-21 | Discharge: 2021-05-21 | Disposition: A | Discharge status: Home / Self Care | Payer: Medicare Other | Attending: Emergency Medicine | Admitting: Emergency Medicine

## 2021-05-21 ENCOUNTER — Other Ambulatory Visit: Payer: Self-pay

## 2021-05-21 ENCOUNTER — Emergency Department (HOSPITAL_COMMUNITY): Payer: Medicare Other

## 2021-05-21 ENCOUNTER — Encounter (HOSPITAL_COMMUNITY): Payer: Self-pay | Admitting: Emergency Medicine

## 2021-05-21 DIAGNOSIS — I2510 Atherosclerotic heart disease of native coronary artery without angina pectoris: Secondary | ICD-10-CM | POA: Diagnosis not present

## 2021-05-21 DIAGNOSIS — J449 Chronic obstructive pulmonary disease, unspecified: Secondary | ICD-10-CM | POA: Diagnosis not present

## 2021-05-21 DIAGNOSIS — Z23 Encounter for immunization: Secondary | ICD-10-CM | POA: Diagnosis not present

## 2021-05-21 DIAGNOSIS — I10 Essential (primary) hypertension: Secondary | ICD-10-CM | POA: Insufficient documentation

## 2021-05-21 DIAGNOSIS — Z87891 Personal history of nicotine dependence: Secondary | ICD-10-CM | POA: Insufficient documentation

## 2021-05-21 DIAGNOSIS — S61411A Laceration without foreign body of right hand, initial encounter: Secondary | ICD-10-CM | POA: Diagnosis not present

## 2021-05-21 DIAGNOSIS — W208XXA Other cause of strike by thrown, projected or falling object, initial encounter: Secondary | ICD-10-CM | POA: Insufficient documentation

## 2021-05-21 DIAGNOSIS — Z951 Presence of aortocoronary bypass graft: Secondary | ICD-10-CM | POA: Diagnosis not present

## 2021-05-21 DIAGNOSIS — Z7901 Long term (current) use of anticoagulants: Secondary | ICD-10-CM | POA: Diagnosis not present

## 2021-05-21 MED ORDER — LIDOCAINE-EPINEPHRINE (PF) 2 %-1:200000 IJ SOLN
10.0000 mL | Freq: Once | INTRAMUSCULAR | Status: AC
  Administered 2021-05-21: 10 mL via INTRADERMAL
  Filled 2021-05-21: qty 20

## 2021-05-21 MED ORDER — TETANUS-DIPHTH-ACELL PERTUSSIS 5-2.5-18.5 LF-MCG/0.5 IM SUSY
0.5000 mL | PREFILLED_SYRINGE | Freq: Once | INTRAMUSCULAR | Status: AC
  Administered 2021-05-21: 0.5 mL via INTRAMUSCULAR
  Filled 2021-05-21: qty 0.5

## 2021-05-21 MED ORDER — HYDROCODONE-ACETAMINOPHEN 5-325 MG PO TABS
1.0000 | ORAL_TABLET | Freq: Once | ORAL | Status: AC
  Administered 2021-05-21: 1 via ORAL
  Filled 2021-05-21: qty 1

## 2021-05-21 MED ORDER — LIDOCAINE-EPINEPHRINE-TETRACAINE (LET) TOPICAL GEL
3.0000 mL | Freq: Once | TOPICAL | Status: AC
  Administered 2021-05-21: 3 mL via TOPICAL
  Filled 2021-05-21: qty 3

## 2021-05-21 NOTE — Discharge Instructions (Addendum)
Please follow-up to have your sutures removed in approximately 7 days.

## 2021-05-21 NOTE — ED Notes (Signed)
Pt in Xray. Xray will bring pt to rm 24 after completion

## 2021-05-21 NOTE — ED Provider Notes (Signed)
[facility] EMERGENCY DEPARTMENT Provider Note   CSN: [id number] Arrival date & time: 05/21/21  1914     History Chief Complaint  Patient presents with   Laceration    [name] is a 75 y.o. male with PMHx HTN, HLD, CAD, atrial fibrillation on Xarelto who presents for evaluation of right hand laceration.   Patient states that he was cutting a steel shaft with a grinder this evening when the metal kicked back and cut his right hand.  He sustained a laceration to the dorsum of his right hand.  He applied direct pressure and subsequently presented to our emergency department for further evaluation.  He is uncertain when his last tetanus was updated.     Past Medical History:  Diagnosis Date   AAA (abdominal aortic aneurysm) ([place])    06/27/20 MRI: 3.0 cm infrarenal AAA, recommend 3 year follow-up   Anxiety    Atrial fibrillation ([place])    Bilateral carotid artery disease ([place]) 07/21/2013   CAD (coronary artery disease) 2008   CABG   COPD (chronic obstructive pulmonary disease) (H[facility])    Dysrhythmia    A-fib   Gastritis    Headache(784.0)    Hypertension    Visit for monitoring Tikosyn therapy 12/2015    Patient Active Problem List   Diagnosis Date Noted   History of bradycardia 10/21/2020   Spinal stenosis at L4-L5 level 08/09/2020   Insomnia 02/06/2018   Visit for monitoring Tikosyn therapy 01/10/2016   Atrial flutter ([place]) 08/07/2015   Bilateral carotid artery disease ([place]) 07/21/2013   CAD (coronary artery disease) of artery bypass graft 01/26/2013   Chronic obstructive pulmonary disease ([place]) 01/26/2013   Essential hypertension, benign 01/26/2013   Hyperlipidemia 01/26/2013    Past Surgical History:  Procedure Laterality Date   CARDIOVERSION N/A 09/08/2015   Procedure: CARDIOVERSION;  Surgeon: Thayer Headings, MD;  Location: [place];  Service: Cardiovascular;  Laterality: N/A;   CORONARY ARTERY BYPASS GRAFT     EXCISION OF KELOID Left  10/08/2018   Procedure: EXCISION OF CHRONIC ABDOMINAL WALL WOUND;  Surgeon: Coralie Keens, MD;  Location: [place];  Service: General;  Laterality: Left;   INCISION AND DRAINAGE DEEP NECK ABSCESS     LUMBAR LAMINECTOMY/DECOMPRESSION MICRODISCECTOMY Right 08/09/2020   Procedure: Laminectomy and Foraminotomy - Lumbar three-Lumbar four - Lumbar four-Lumbar five- right;  Surgeon: Kary Kos, MD;  Location: [place];  Service: Neurosurgery;  Laterality: Right;   ROTATOR CUFF REPAIR Bilateral    TEE WITHOUT CARDIOVERSION N/A 08/02/2015   Procedure: TRANSESOPHAGEAL ECHOCARDIOGRAM (TEE);  Surgeon: Sueanne Margarita, MD;  Location: [facility] ENDOSCOPY;  Service: Cardiovascular;  Laterality: N/A;   TEE WITHOUT CARDIOVERSION N/A 09/08/2015   Procedure: TRANSESOPHAGEAL ECHOCARDIOGRAM (TEE);  Surgeon: Thayer Headings, MD;  Location: [facility] ENDOSCOPY;  Service: Cardiovascular;  Laterality: N/A;       Family History  Problem Relation Age of Onset   Diabetes Mother    Hypertension Mother    Heart attack Father    Sudden death Father    Hypertension Sister    Diabetes Sister     Social History   Tobacco Use   Smoking status: Former   Smokeless tobacco: Former    Types: Nurse, children's Use: Never used  Substance Use Topics   Alcohol use: No    Comment: quit drinking in the 70's   Drug use: No    Home Medications Prior to Admission medications  Medication Sig Start Date End Date Taking? Authorizing Provider  ALPRAZolam Duanne Moron) 0.25 MG tablet TAKE 1 TABLET(0.25 MG) BY MOUTH DAILY 11/12/20   Lovena Le, Malena M, DO  amLODipine (NORVASC) 5 MG tablet TAKE 1 TABLET(5 MG) BY MOUTH DAILY 05/18/21   Sherran Needs, NP  atorvastatin (LIPITOR) 40 MG tablet TAKE 1 TABLET(40 MG) BY MOUTH DAILY 06/03/20   Lovena Le, Malena M, DO  diltiazem (CARDIZEM) 30 MG tablet Take 1 tablet every 4 hours AS NEEDED for heart rate >100 as long as blood pressure >100. 06/11/20   Sherran Needs, NP  dofetilide  (TIKOSYN) 500 MCG capsule Take 1 capsule (500 mcg total) by mouth 2 (two) times daily. 03/28/21   Sherran Needs, NP  gabapentin (NEURONTIN) 300 MG capsule Take 1 capsule (300 mg total) by mouth at bedtime. 05/20/20   Mcarthur Rossetti, MD  lisinopril (ZESTRIL) 20 MG tablet TAKE 1 TABLET(20 MG) BY MOUTH DAILY 06/03/20   Elvia Collum M, DO  Multiple Vitamins-Minerals (MULTIVITAMIN WITH MINERALS) tablet Take 1 tablet by mouth at bedtime.     [provider]  Omega-3 Fatty Acids (FISH OIL) 1000 MG CPDR Take 1,000 mg by mouth at bedtime.     [provider]  penicillin v potassium (VEETID) 500 MG tablet Take 500 mg by mouth 3 (three) times daily. 03/02/21   [provider]  potassium chloride (KLOR-CON) 10 MEQ tablet TAKE 1 TABLET(10 MEQ) BY MOUTH DAILY 12/24/20   Nilda Simmer, NP  vitamin C (ASCORBIC ACID) 500 MG tablet Take 500 mg by mouth at bedtime.     [provider]  XARELTO 20 MG TABS tablet TAKE 1 TABLET(20 MG) BY MOUTH DAILY WITH SUPPER 05/18/21   Sherran Needs, NP    Allergies    Doxycycline and Nsaids  Review of Systems   Review of Systems  Constitutional:  Negative for chills and fever.  HENT:  Negative for ear pain and sore throat.   Eyes:  Negative for pain and visual disturbance.  Respiratory:  Negative for cough and shortness of breath.   Cardiovascular:  Negative for chest pain and palpitations.  Gastrointestinal:  Negative for abdominal pain and vomiting.  Genitourinary:  Negative for dysuria and hematuria.  Musculoskeletal:  Negative for arthralgias and back pain.  Skin:  Positive for wound. Negative for color change and rash.  Neurological:  Negative for seizures and syncope.  All other systems reviewed and are negative.  Physical Exam Updated Vital Signs BP (!) 177/94   Pulse 79   Temp 97.9 F (36.6 C) (Oral)   Resp 16   SpO2 96%   Physical Exam Vitals and nursing note reviewed.  Constitutional:      Appearance:  He is well-developed.  HENT:     Head: Normocephalic and atraumatic.  Eyes:     Conjunctiva/sclera: Conjunctivae normal.  Cardiovascular:     Rate and Rhythm: Normal rate and regular rhythm.     Heart sounds: No murmur heard. Pulmonary:     Effort: Pulmonary effort is normal. No respiratory distress.     Breath sounds: Normal breath sounds.  Abdominal:     Palpations: Abdomen is soft.     Tenderness: There is no abdominal tenderness.  Musculoskeletal:     Cervical back: Neck supple.     Comments: Approximately 3 cm laceration to the dorsum of the right hand, near the thumb.  Skin:    General: Skin is warm and dry.  Capillary Refill: Capillary refill takes less than 2 seconds.  Neurological:     Mental Status: He is alert.    ED Results / Procedures / Treatments   Labs (all labs ordered are listed, but only abnormal results are displayed) Labs Reviewed - No data to display  EKG None  Radiology DG Hand Complete Right  Result Date: 05/21/2021 CLINICAL DATA:  Laceration.  saw. EXAM: RIGHT HAND - COMPLETE 3+ VIEW COMPARISON:  Right and FINDINGS: There is no evidence of fracture or dislocation. There is no evidence of arthropathy or other focal bone abnormality. No definite retained radiopaque foreign body. Several curvilinear densities along the volar subcutaneus soft tissues of the wrist were previously noted on x-ray right wrist 10/11/2007 and are more prominent now. These may represent calcifications. Laceration is poorly visualized. Otherwise soft tissues are unremarkable. IMPRESSION: 1. No acute displaced fracture or dislocation. 2. No definite retained radiopaque foreign body. Electronically Signed   By: Iven Finn M.D.   On: 05/21/2021 20:27    Procedures .Marland KitchenLaceration Repair  Date/Time: 05/21/2021 8:00 PM Performed by: Violet Baldy, MD Authorized by: Carmin Muskrat, MD   Consent:    Consent obtained:  Verbal   Consent given by:  Patient   Risks discussed:   Infection, pain, need for additional repair, nerve damage, tendon damage, retained foreign body, poor cosmetic result, poor wound healing and vascular damage   Alternatives discussed:  No treatment, delayed treatment, observation and referral Universal protocol:    Patient identity confirmed:  Verbally with patient, hospital-assigned identification number, arm band and provided demographic data Anesthesia:    Anesthesia method:  Local infiltration   Local anesthetic:  Lidocaine 2% WITH epi Laceration details:    Location:  Hand   Hand location:  R hand, dorsum   Length (cm):  3   Depth (mm):  6 Pre-procedure details:    Preparation:  Patient was prepped and draped in usual sterile fashion and imaging obtained to evaluate for foreign bodies Exploration:    Limited defect created (wound extended): no     Hemostasis achieved with:  Epinephrine, direct pressure and LET   Imaging obtained: x-ray     Imaging outcome: foreign body not noted     Wound exploration: wound explored through full range of motion and entire depth of wound visualized     Contaminated: no   Treatment:    Area cleansed with:  Saline   Amount of cleaning:  Standard   Irrigation solution:  Sterile water   Irrigation volume:  500   Irrigation method:  Pressure wash   Visualized foreign bodies/material removed: no     Debridement:  None   Undermining:  None   Scar revision: no   Skin repair:    Repair method:  Sutures   Suture size:  5-0   Suture material:  Prolene   Suture technique:  Simple interrupted   Number of sutures:  5 Approximation:    Approximation:  Close Repair type:    Repair type:  Simple Post-procedure details:    Dressing:  Antibiotic ointment and sterile dressing   Procedure completion:  Tolerated well, no immediate complications   Medications Ordered in ED Medications  lidocaine-EPINEPHrine (XYLOCAINE W/EPI) 2 %-1:200000 (PF) injection 10 mL (10 mLs Intradermal Given 05/21/21 2110)  Tdap  (BOOSTRIX) injection 0.5 mL (0.5 mLs Intramuscular Given 05/21/21 2115)  lidocaine-EPINEPHrine-tetracaine (LET) topical gel (3 mLs Topical Given 05/21/21 2110)  HYDROcodone-acetaminophen (NORCO/VICODIN) 5-325 MG per tablet 1 tablet (1 tablet  Oral Given 05/21/21 2255)    ED Course  I have reviewed the triage vital signs and the nursing notes.  Pertinent labs & imaging results that were available during my care of the patient were reviewed by me and considered in my medical decision making (see chart for details).    MDM Rules/Calculators/A&P                           75 y.o. male with past medical history as above who presents for evaluation of right hand laceration. Afebrile and hemodynamically stable.  Exam as detailed above.  X-ray obtained without evidence of retained foreign body.  Wound was copiously irrigated.  Laceration repair performed, as documented above.  Pressure dressing was placed and patient was observed in the emergency department for hemostasis given Xarelto use.  Patient will follow-up to have sutures removed as directed.  Final Clinical Impression(s) / ED Diagnoses Final diagnoses:  Laceration of right hand without foreign body, initial encounter    Rx / DC Orders ED Discharge Orders     None        Violet Baldy, MD 05/22/21 1610    Carmin Muskrat, MD 05/25/21 [phone]

## 2021-05-21 NOTE — ED Triage Notes (Signed)
Patient cut his right hand with a grinder.  Laceration about 1.5 inches.  Bleeding controlled.  Patient is on Xerelto.  Patient has pain from hand to elbow.

## 2021-05-24 DIAGNOSIS — S61411A Laceration without foreign body of right hand, initial encounter: Secondary | ICD-10-CM | POA: Diagnosis not present

## 2021-05-26 ENCOUNTER — Other Ambulatory Visit: Payer: Self-pay

## 2021-05-26 ENCOUNTER — Emergency Department (HOSPITAL_COMMUNITY)
Admission: EM | Admit: 2021-05-26 | Discharge: 2021-05-26 | Disposition: A | Discharge status: Home / Self Care | Payer: Medicare Other | Attending: Student | Admitting: Student

## 2021-05-26 ENCOUNTER — Encounter (HOSPITAL_COMMUNITY): Payer: Self-pay | Admitting: Emergency Medicine

## 2021-05-26 DIAGNOSIS — S60221A Contusion of right hand, initial encounter: Secondary | ICD-10-CM | POA: Insufficient documentation

## 2021-05-26 DIAGNOSIS — S61451A Open bite of right hand, initial encounter: Secondary | ICD-10-CM | POA: Diagnosis not present

## 2021-05-26 DIAGNOSIS — S6992XA Unspecified injury of left wrist, hand and finger(s), initial encounter: Secondary | ICD-10-CM | POA: Diagnosis present

## 2021-05-26 DIAGNOSIS — Z951 Presence of aortocoronary bypass graft: Secondary | ICD-10-CM | POA: Diagnosis not present

## 2021-05-26 DIAGNOSIS — J449 Chronic obstructive pulmonary disease, unspecified: Secondary | ICD-10-CM | POA: Insufficient documentation

## 2021-05-26 DIAGNOSIS — X58XXXA Exposure to other specified factors, initial encounter: Secondary | ICD-10-CM | POA: Insufficient documentation

## 2021-05-26 DIAGNOSIS — S61411A Laceration without foreign body of right hand, initial encounter: Secondary | ICD-10-CM | POA: Diagnosis not present

## 2021-05-26 DIAGNOSIS — I119 Hypertensive heart disease without heart failure: Secondary | ICD-10-CM | POA: Insufficient documentation

## 2021-05-26 DIAGNOSIS — S61412A Laceration without foreign body of left hand, initial encounter: Secondary | ICD-10-CM | POA: Diagnosis not present

## 2021-05-26 DIAGNOSIS — Z79899 Other long term (current) drug therapy: Secondary | ICD-10-CM | POA: Diagnosis not present

## 2021-05-26 DIAGNOSIS — Z87891 Personal history of nicotine dependence: Secondary | ICD-10-CM | POA: Insufficient documentation

## 2021-05-26 DIAGNOSIS — S61411D Laceration without foreign body of right hand, subsequent encounter: Secondary | ICD-10-CM | POA: Diagnosis not present

## 2021-05-26 DIAGNOSIS — I2510 Atherosclerotic heart disease of native coronary artery without angina pectoris: Secondary | ICD-10-CM | POA: Insufficient documentation

## 2021-05-26 DIAGNOSIS — M79641 Pain in right hand: Secondary | ICD-10-CM

## 2021-05-26 MED ORDER — CEFTRIAXONE SODIUM 1 G IJ SOLR
1.0000 g | Freq: Once | INTRAMUSCULAR | Status: AC
  Administered 2021-05-26: 1 g via INTRAMUSCULAR
  Filled 2021-05-26: qty 10

## 2021-05-26 MED ORDER — LIDOCAINE HCL (PF) 1 % IJ SOLN
INTRAMUSCULAR | Status: AC
  Filled 2021-05-26: qty 2

## 2021-05-26 NOTE — ED Triage Notes (Signed)
PT reports hand being sutured on Saturday night. Today pts right hand is swollen and red. Pt was advised to come to ER by PCP.

## 2021-05-26 NOTE — Discharge Instructions (Addendum)
Continue current antibiotics.  Recheck tomorrow.

## 2021-05-26 NOTE — ED Provider Notes (Signed)
[facility] EMERGENCY DEPARTMENT Provider Note   CSN: [id number] Arrival date & time: 05/26/21  1620     History Chief Complaint  Patient presents with   Hand Pain    [name] is a 75 y.o. male.  Pt cut his hand on 9/24.  Pt had sutures placed.  Pt reports he saw his MD today and was sent here for possible infection.  Pt's Md started him on antibiotics   The history is provided by the patient. No language interpreter was used.  Hand Pain This is a new problem. The problem occurs constantly. The problem has not changed since onset.Nothing aggravates the symptoms. Nothing relieves the symptoms. He has tried nothing for the symptoms. The treatment provided no relief.      Past Medical History:  Diagnosis Date   AAA (abdominal aortic aneurysm) ([place])    06/27/20 MRI: 3.0 cm infrarenal AAA, recommend 3 year follow-up   Anxiety    Atrial fibrillation ([place])    Bilateral carotid artery disease ([place]) 07/21/2013   CAD (coronary artery disease) 2008   CABG   COPD (chronic obstructive pulmonary disease) ([facility])    Dysrhythmia    A-fib   Gastritis    Headache(784.0)    Hypertension    Visit for monitoring Tikosyn therapy 12/2015    Patient Active Problem List   Diagnosis Date Noted   History of bradycardia 10/21/2020   Spinal stenosis at L4-L5 level 08/09/2020   Insomnia 02/06/2018   Visit for monitoring Tikosyn therapy 01/10/2016   Atrial flutter ([place]) 08/07/2015   Bilateral carotid artery disease ([place]) 07/21/2013   CAD (coronary artery disease) of artery bypass graft 01/26/2013   Chronic obstructive pulmonary disease ([place]) 01/26/2013   Essential hypertension, benign 01/26/2013   Hyperlipidemia 01/26/2013    Past Surgical History:  Procedure Laterality Date   CARDIOVERSION N/A 09/08/2015   Procedure: CARDIOVERSION;  Surgeon: Thayer Headings, MD;  Location: [place];  Service: Cardiovascular;  Laterality: N/A;   CORONARY ARTERY BYPASS GRAFT     EXCISION OF KELOID  Left 10/08/2018   Procedure: EXCISION OF CHRONIC ABDOMINAL WALL WOUND;  Surgeon: Coralie Keens, MD;  Location: [place];  Service: General;  Laterality: Left;   INCISION AND DRAINAGE DEEP NECK ABSCESS     LUMBAR LAMINECTOMY/DECOMPRESSION MICRODISCECTOMY Right 08/09/2020   Procedure: Laminectomy and Foraminotomy - Lumbar three-Lumbar four - Lumbar four-Lumbar five- right;  Surgeon: Kary Kos, MD;  Location: [place];  Service: Neurosurgery;  Laterality: Right;   ROTATOR CUFF REPAIR Bilateral    TEE WITHOUT CARDIOVERSION N/A 08/02/2015   Procedure: TRANSESOPHAGEAL ECHOCARDIOGRAM (TEE);  Surgeon: Sueanne Margarita, MD;  Location: [facility] ENDOSCOPY;  Service: Cardiovascular;  Laterality: N/A;   TEE WITHOUT CARDIOVERSION N/A 09/08/2015   Procedure: TRANSESOPHAGEAL ECHOCARDIOGRAM (TEE);  Surgeon: Thayer Headings, MD;  Location: [facility] ENDOSCOPY;  Service: Cardiovascular;  Laterality: N/A;       Family History  Problem Relation Age of Onset   Diabetes Mother    Hypertension Mother    Heart attack Father    Sudden death Father    Hypertension Sister    Diabetes Sister     Social History   Tobacco Use   Smoking status: Former   Smokeless tobacco: Former    Types: Nurse, children's Use: Never used  Substance Use Topics   Alcohol use: No    Comment: quit drinking in the 70's   Drug use: No    Home  Medications Prior to Admission medications   Medication Sig Start Date End Date Taking? Authorizing Provider  ALPRAZolam Duanne Moron) 0.25 MG tablet TAKE 1 TABLET(0.25 MG) BY MOUTH DAILY 11/12/20   Lovena Le, Malena M, DO  amLODipine (NORVASC) 5 MG tablet TAKE 1 TABLET(5 MG) BY MOUTH DAILY 05/18/21   Sherran Needs, NP  atorvastatin (LIPITOR) 40 MG tablet TAKE 1 TABLET(40 MG) BY MOUTH DAILY 06/03/20   Lovena Le, Malena M, DO  diltiazem (CARDIZEM) 30 MG tablet Take 1 tablet every 4 hours AS NEEDED for heart rate >100 as long as blood pressure >100. 06/11/20   Sherran Needs, NP   dofetilide (TIKOSYN) 500 MCG capsule Take 1 capsule (500 mcg total) by mouth 2 (two) times daily. 03/28/21   Sherran Needs, NP  gabapentin (NEURONTIN) 300 MG capsule Take 1 capsule (300 mg total) by mouth at bedtime. 05/20/20   Mcarthur Rossetti, MD  lisinopril (ZESTRIL) 20 MG tablet TAKE 1 TABLET(20 MG) BY MOUTH DAILY 06/03/20   Elvia Collum M, DO  Multiple Vitamins-Minerals (MULTIVITAMIN WITH MINERALS) tablet Take 1 tablet by mouth at bedtime.     [provider]  Omega-3 Fatty Acids (FISH OIL) 1000 MG CPDR Take 1,000 mg by mouth at bedtime.     [provider]  penicillin v potassium (VEETID) 500 MG tablet Take 500 mg by mouth 3 (three) times daily. 03/02/21   [provider]  potassium chloride (KLOR-CON) 10 MEQ tablet TAKE 1 TABLET(10 MEQ) BY MOUTH DAILY 12/24/20   Nilda Simmer, NP  vitamin C (ASCORBIC ACID) 500 MG tablet Take 500 mg by mouth at bedtime.     [provider]  XARELTO 20 MG TABS tablet TAKE 1 TABLET(20 MG) BY MOUTH DAILY WITH SUPPER 05/18/21   Sherran Needs, NP    Allergies    Doxycycline and Nsaids  Review of Systems   Review of Systems  Constitutional:  Negative for fever.  Skin:  Positive for wound.  All other systems reviewed and are negative.  Physical Exam Updated Vital Signs BP (!) 156/67 (BP Location: Left Arm)   Pulse 71   Temp 98.4 F (36.9 C) (Oral)   Resp 15   SpO2 95%   Physical Exam Vitals reviewed.  Cardiovascular:     Rate and Rhythm: Normal rate.     Pulses: Normal pulses.  Pulmonary:     Effort: Pulmonary effort is normal.  Musculoskeletal:     Comments: Laceration dorsal area left hand,  from  nv and ns intact,  small pustule at sutures, likely reactive, hematoma   Skin:    General: Skin is warm.  Neurological:     General: No focal deficit present.     Mental Status: He is alert.  Psychiatric:        Mood and Affect: Mood normal.    ED Results / Procedures / Treatments    Labs (all labs ordered are listed, but only abnormal results are displayed) Labs Reviewed - No data to display  EKG None  Radiology No results found.  Procedures Procedures   Medications Ordered in ED Medications  cefTRIAXone (ROCEPHIN) injection 1 g (has no administration in time range)  lidocaine (PF) (XYLOCAINE) 1 % injection (has no administration in time range)  lidocaine (PF) (XYLOCAINE) 1 % injection (has no administration in time range)    ED Course  I have reviewed the triage vital signs and the nursing notes.  Pertinent labs & imaging results that were available during  my care of the patient were reviewed by me and considered in my medical decision making (see chart for details).    MDM Rules/Calculators/A&P                           Sutures removed,  hematoma cleaned out, wound cleaned irrigated with 2 liters of saline.   Pt given Rocephin 1 gram Im   Final Clinical Impression(s) / ED Diagnoses Final diagnoses:  Bite wound of right hand with infection, initial encounter    Rx / DC Orders ED Discharge Orders     None     An After Visit Summary was printed and given to the patient.    Fransico Meadow, Hershal Coria 05/26/21 1817    Teressa Lower, MD 05/27/21 [phone]

## 2021-05-30 DIAGNOSIS — S61411A Laceration without foreign body of right hand, initial encounter: Secondary | ICD-10-CM | POA: Diagnosis not present

## 2021-05-30 DIAGNOSIS — T148XXA Other injury of unspecified body region, initial encounter: Secondary | ICD-10-CM | POA: Diagnosis not present

## 2021-05-30 DIAGNOSIS — S60221A Contusion of right hand, initial encounter: Secondary | ICD-10-CM | POA: Diagnosis not present

## 2021-06-13 DIAGNOSIS — F419 Anxiety disorder, unspecified: Secondary | ICD-10-CM | POA: Insufficient documentation

## 2021-06-13 DIAGNOSIS — S60221A Contusion of right hand, initial encounter: Secondary | ICD-10-CM | POA: Diagnosis not present

## 2021-06-13 DIAGNOSIS — S61411A Laceration without foreign body of right hand, initial encounter: Secondary | ICD-10-CM | POA: Diagnosis not present

## 2021-06-13 DIAGNOSIS — T148XXA Other injury of unspecified body region, initial encounter: Secondary | ICD-10-CM | POA: Diagnosis not present

## 2021-06-13 DIAGNOSIS — I482 Chronic atrial fibrillation, unspecified: Secondary | ICD-10-CM | POA: Insufficient documentation

## 2021-06-13 DIAGNOSIS — E782 Mixed hyperlipidemia: Secondary | ICD-10-CM | POA: Insufficient documentation

## 2021-06-16 DIAGNOSIS — I10 Essential (primary) hypertension: Secondary | ICD-10-CM | POA: Diagnosis not present

## 2021-06-22 DIAGNOSIS — F411 Generalized anxiety disorder: Secondary | ICD-10-CM | POA: Insufficient documentation

## 2021-06-22 DIAGNOSIS — R7303 Prediabetes: Secondary | ICD-10-CM | POA: Insufficient documentation

## 2021-06-22 DIAGNOSIS — I2510 Atherosclerotic heart disease of native coronary artery without angina pectoris: Secondary | ICD-10-CM | POA: Insufficient documentation

## 2021-06-23 DIAGNOSIS — I482 Chronic atrial fibrillation, unspecified: Secondary | ICD-10-CM | POA: Diagnosis not present

## 2021-06-23 DIAGNOSIS — R800 Isolated proteinuria: Secondary | ICD-10-CM | POA: Diagnosis not present

## 2021-06-23 DIAGNOSIS — E782 Mixed hyperlipidemia: Secondary | ICD-10-CM | POA: Diagnosis not present

## 2021-06-23 DIAGNOSIS — F411 Generalized anxiety disorder: Secondary | ICD-10-CM | POA: Diagnosis not present

## 2021-06-23 DIAGNOSIS — I10 Essential (primary) hypertension: Secondary | ICD-10-CM | POA: Diagnosis not present

## 2021-06-23 DIAGNOSIS — M25551 Pain in right hip: Secondary | ICD-10-CM | POA: Diagnosis not present

## 2021-06-23 DIAGNOSIS — I25810 Atherosclerosis of coronary artery bypass graft(s) without angina pectoris: Secondary | ICD-10-CM | POA: Diagnosis not present

## 2021-06-23 DIAGNOSIS — Z23 Encounter for immunization: Secondary | ICD-10-CM | POA: Diagnosis not present

## 2021-06-23 DIAGNOSIS — Z125 Encounter for screening for malignant neoplasm of prostate: Secondary | ICD-10-CM | POA: Diagnosis not present

## 2021-06-23 DIAGNOSIS — R7303 Prediabetes: Secondary | ICD-10-CM | POA: Diagnosis not present

## 2021-09-02 ENCOUNTER — Other Ambulatory Visit: Payer: Self-pay

## 2021-09-02 ENCOUNTER — Ambulatory Visit (HOSPITAL_COMMUNITY)
Admission: RE | Admit: 2021-09-02 | Discharge: 2021-09-02 | Disposition: A | Discharge status: Home / Self Care | Payer: Medicare Other | Source: Ambulatory Visit | Attending: Cardiology | Admitting: Cardiology

## 2021-09-02 DIAGNOSIS — I6523 Occlusion and stenosis of bilateral carotid arteries: Secondary | ICD-10-CM

## 2021-09-02 DIAGNOSIS — I779 Disorder of arteries and arterioles, unspecified: Secondary | ICD-10-CM | POA: Diagnosis not present

## 2021-09-15 ENCOUNTER — Other Ambulatory Visit: Payer: Self-pay

## 2021-09-15 ENCOUNTER — Encounter: Payer: Self-pay | Admitting: Cardiology

## 2021-09-15 ENCOUNTER — Ambulatory Visit: Payer: Medicare Other | Admitting: Cardiology

## 2021-09-15 ENCOUNTER — Ambulatory Visit (INDEPENDENT_AMBULATORY_CARE_PROVIDER_SITE_OTHER): Payer: Medicare Other | Admitting: Cardiology

## 2021-09-15 DIAGNOSIS — I4891 Unspecified atrial fibrillation: Secondary | ICD-10-CM | POA: Insufficient documentation

## 2021-09-15 DIAGNOSIS — I480 Paroxysmal atrial fibrillation: Secondary | ICD-10-CM | POA: Diagnosis not present

## 2021-09-15 DIAGNOSIS — I25708 Atherosclerosis of coronary artery bypass graft(s), unspecified, with other forms of angina pectoris: Secondary | ICD-10-CM

## 2021-09-15 DIAGNOSIS — I6523 Occlusion and stenosis of bilateral carotid arteries: Secondary | ICD-10-CM | POA: Diagnosis not present

## 2021-09-15 DIAGNOSIS — I10 Essential (primary) hypertension: Secondary | ICD-10-CM

## 2021-09-15 DIAGNOSIS — D6869 Other thrombophilia: Secondary | ICD-10-CM | POA: Diagnosis not present

## 2021-09-15 DIAGNOSIS — I714 Abdominal aortic aneurysm, without rupture, unspecified: Secondary | ICD-10-CM | POA: Diagnosis not present

## 2021-09-15 NOTE — Patient Instructions (Signed)
Medication Instructions:  Your physician recommends that you continue on your current medications as directed. Please refer to the Current Medication list given to you today.   Labwork: None   Testing/Procedures: None   Follow-Up: 1 year  Any Other Special Instructions Will Be Listed Below (If Applicable).  If you need a refill on your cardiac medications before your next appointment, please call your pharmacy.

## 2021-09-15 NOTE — Assessment & Plan Note (Signed)
Continue with Xarelto.  Continue with lab work monitoring, prior creatinine 1.23, hemoglobin 14.6.

## 2021-09-15 NOTE — Assessment & Plan Note (Signed)
Blood pressure under reasonable control

## 2021-09-15 NOTE — Assessment & Plan Note (Signed)
Moderate disease noted.  Continue with goal-directed medical therapy.  On atorvastatin 40 mg a day.  Less than 70.  At last check 65.  Excellent.  No myalgias.

## 2021-09-15 NOTE — Assessment & Plan Note (Signed)
Continue with Tikosyn 5 mg twice a day.  QT has been reassuring.  No AV nodal blocking agents.  Has diltiazem 30 mg if needed

## 2021-09-15 NOTE — Progress Notes (Signed)
Cardiology Office Note:    Date:  09/15/2021   ID:  [name], DOB 1946-07-22, MRN [id number]  PCP:  Celene Squibb, MD   [facility] Providers Cardiologist:  Candee Furbish, MD     Referring MD: Celene Squibb, MD    History of Present Illness:    [name] is a 76 y.o. male here for follow-up of paroxysmal atrial fibrillation.  Has been on Tikosyn 500 mcg twice daily.  Sees [name] in the A. fib clinic as well.  Previous visit was with her.  Had spinal surgery in December 2021.  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  denies any fevers chills nausea vomiting syncope bleeding.  He cut his hand.  Saw Dr. Amedeo Plenty.  He evacuated a small hematoma.  Past Medical History:  Diagnosis Date   AAA (abdominal aortic aneurysm)    06/27/20 MRI: 3.0 cm infrarenal AAA, recommend 3 year follow-up   Anxiety    Atrial fibrillation ([place])    Bilateral carotid artery disease ([place]) 07/21/2013   CAD (coronary artery disease) 2008   CABG   COPD (chronic obstructive pulmonary disease) (HCC)    Dysrhythmia    A-fib   Gastritis    Headache(784.0)    Hypertension    Visit for monitoring Tikosyn therapy 12/2015    Past Surgical History:  Procedure Laterality Date   CARDIOVERSION N/A 09/08/2015   Procedure: CARDIOVERSION;  Surgeon: Thayer Headings, MD;  Location: [place];  Service: Cardiovascular;  Laterality: N/A;   CORONARY ARTERY BYPASS GRAFT     EXCISION OF KELOID Left 10/08/2018   Procedure: EXCISION OF CHRONIC ABDOMINAL WALL WOUND;  Surgeon: Coralie Keens, MD;  Location: [place];  Service: General;  Laterality: Left;   INCISION AND DRAINAGE DEEP NECK ABSCESS     LUMBAR LAMINECTOMY/DECOMPRESSION MICRODISCECTOMY Right 08/09/2020   Procedure: Laminectomy and Foraminotomy - Lumbar three-Lumbar four - Lumbar four-Lumbar five- right;  Surgeon: Kary Kos, MD;  Location: [place];  Service: Neurosurgery;  Laterality: Right;    ROTATOR CUFF REPAIR Bilateral    TEE WITHOUT CARDIOVERSION N/A 08/02/2015   Procedure: TRANSESOPHAGEAL ECHOCARDIOGRAM (TEE);  Surgeon: Sueanne Margarita, MD;  Location: [facility] ENDOSCOPY;  Service: Cardiovascular;  Laterality: N/A;   TEE WITHOUT CARDIOVERSION N/A 09/08/2015   Procedure: TRANSESOPHAGEAL ECHOCARDIOGRAM (TEE);  Surgeon: Thayer Headings, MD;  Location: [facility] ENDOSCOPY;  Service: Cardiovascular;  Laterality: N/A;    Current Medications: Current Meds  Medication Sig   ALPRAZolam (XANAX) 0.25 MG tablet TAKE 1 TABLET(0.25 MG) BY MOUTH DAILY   amLODipine (NORVASC) 5 MG tablet TAKE 1 TABLET(5 MG) BY MOUTH DAILY   atorvastatin (LIPITOR) 40 MG tablet TAKE 1 TABLET(40 MG) BY MOUTH DAILY   diltiazem (CARDIZEM) 30 MG tablet Take 1 tablet every 4 hours AS NEEDED for heart rate >100 as long as blood pressure >100.   dofetilide (TIKOSYN) 500 MCG capsule Take 1 capsule (500 mcg total) by mouth 2 (two) times daily.   gabapentin (NEURONTIN) 300 MG capsule Take 1 capsule (300 mg total) by mouth at bedtime.   lisinopril (ZESTRIL) 20 MG tablet TAKE 1 TABLET(20 MG) BY MOUTH DAILY   Multiple Vitamins-Minerals (MULTIVITAMIN WITH MINERALS) tablet Take 1 tablet by mouth at bedtime.    Omega-3 Fatty Acids (FISH OIL) 1000 MG CPDR Take 1,000 mg by mouth at bedtime.    penicillin v potassium (VEETID) 500 MG tablet Take 500 mg by mouth 3 (three)  times daily.   potassium chloride (KLOR-CON) 10 MEQ tablet TAKE 1 TABLET(10 MEQ) BY MOUTH DAILY   vitamin C (ASCORBIC ACID) 500 MG tablet Take 500 mg by mouth at bedtime.    XARELTO 20 MG TABS tablet TAKE 1 TABLET(20 MG) BY MOUTH DAILY WITH SUPPER     Allergies:   Doxycycline and Nsaids   Social History   Socioeconomic History   Marital status: Married    Spouse name: Not on file   Number of children: Not on file   Years of education: Not on file   Highest education level: Not on file  Occupational History   Not on file  Tobacco Use   Smoking status: Former    Smokeless tobacco: Former    Types: Nurse, children's Use: Never used  Substance and Sexual Activity   Alcohol use: No    Comment: quit drinking in the 70's   Drug use: No   Sexual activity: Not on file  Other Topics Concern   Not on file  Social History Narrative   Not on file   Social Determinants of Health   Financial Resource Strain: Not on file  Food Insecurity: Not on file  Transportation Needs: Not on file  Physical Activity: Not on file  Stress: Not on file  Social Connections: Not on file     Family History: The patient's family history includes Diabetes in his mother and sister; Heart attack in his father; Hypertension in his mother and sister; Sudden death in his father.  ROS:   Please see the history of present illness.     All other systems reviewed and are negative.  EKGs/Labs/Other Studies Reviewed:    The following studies were reviewed today: Prior EKG, echocardiogram, office notes reviewed  EKG: No new today  Recent Labs: 03/10/2021: BUN 13; Creatinine, Ser 1.23; Magnesium 2.3; Potassium 4.7; Sodium 138  Recent Lipid Panel    Component Value Date/Time   CHOL 141 01/12/2020 0838   TRIG 71 01/12/2020 0838   HDL 57 01/12/2020 0838   CHOLHDL 2.5 01/12/2020 0838   CHOLHDL 2.6 08/03/2014 0822   VLDL 10 08/03/2014 0822   LDLCALC 70 01/12/2020 0838     Risk Assessment/Calculations:              Physical Exam:    VS:  BP 138/70    Pulse 65    Ht 5\' 5"  (1.651 m)    Wt 145 lb 9.6 oz (66 kg)    SpO2 97%    BMI 24.23 kg/m     Wt Readings from Last 3 Encounters:  09/15/21 145 lb 9.6 oz (66 kg)  03/10/21 146 lb 9.6 oz (66.5 kg)  09/15/20 145 lb 12.8 oz (66.1 kg)     GEN:  Well nourished, well developed in no acute distress HEENT: Normal NECK: No JVD; No carotid bruits LYMPHATICS: No lymphadenopathy CARDIAC: RRR, no murmurs, no rubs, gallops CABG scar noted RESPIRATORY:  Clear to auscultation without rales, wheezing or rhonchi   ABDOMEN: Soft, non-tender, non-distended MUSCULOSKELETAL:  No edema; No deformity  SKIN: Warm and dry NEUROLOGIC:  Alert and oriented x 3 PSYCHIATRIC:  Normal affect   ASSESSMENT:    1. Paroxysmal atrial fibrillation (HCC)   2. Bilateral carotid artery stenosis   3. Coronary artery disease involving coronary bypass graft of native heart with other forms of angina pectoris ([place])   4. Essential hypertension, benign   5. Hypercoagulable state, secondary ([place])  6. Abdominal aortic aneurysm (AAA) without rupture, unspecified part    PLAN:    In order of problems listed above:  Paroxysmal atrial fibrillation (HCC) Continue with Tikosyn 5 mg twice a day.  QT has been reassuring.  No AV nodal blocking agents.  Has diltiazem 30 mg if needed  Bilateral carotid artery disease Moderate disease noted.  Continue with goal-directed medical therapy.  On atorvastatin 40 mg a day.  Less than 70.  At last check 65.  Excellent.  No myalgias.  CAD (coronary artery disease) of artery bypass graft Prior bypass surgery in 2008.  No anginal symptoms currently.  Doing well.  He is not on aspirin because he is on Xarelto.  Essential hypertension, benign Blood pressure under reasonable control  Hypercoagulable state, secondary ([place]) Continue with Xarelto.  Continue with lab work monitoring, prior creatinine 1.23, hemoglobin 14.6.  AAA (abdominal aortic aneurysm) without rupture 3 cm in October 2021.  We will repeat in 2024.          Medication Adjustments/Labs and Tests Ordered: Current medicines are reviewed at length with the patient today.  Concerns regarding medicines are outlined above.  No orders of the defined types were placed in this encounter.  No orders of the defined types were placed in this encounter.   Patient Instructions  Medication Instructions:  Your physician recommends that you continue on your current medications as directed. Please refer to the Current Medication list  given to you today.   Labwork: None   Testing/Procedures: None   Follow-Up: 1 year  Any Other Special Instructions Will Be Listed Below (If Applicable).  If you need a refill on your cardiac medications before your next appointment, please call your pharmacy.    Signed, Candee Furbish, MD  09/15/2021 10:51 AM    [place]

## 2021-09-15 NOTE — Assessment & Plan Note (Signed)
3 cm in October 2021.  We will repeat in 2024.

## 2021-09-15 NOTE — Assessment & Plan Note (Signed)
Prior bypass surgery in 2008.  No anginal symptoms currently.  Doing well.  He is not on aspirin because he is on Xarelto.

## 2021-09-21 ENCOUNTER — Encounter (HOSPITAL_COMMUNITY): Payer: Self-pay | Admitting: Nurse Practitioner

## 2021-09-21 ENCOUNTER — Ambulatory Visit (HOSPITAL_COMMUNITY)
Admission: RE | Admit: 2021-09-21 | Discharge: 2021-09-21 | Disposition: A | Discharge status: Home / Self Care | Payer: Medicare Other | Source: Ambulatory Visit | Attending: Nurse Practitioner | Admitting: Nurse Practitioner

## 2021-09-21 ENCOUNTER — Other Ambulatory Visit: Payer: Self-pay

## 2021-09-21 VITALS — BP 142/66 | HR 59 | Ht 65.0 in | Wt 146.0 lb

## 2021-09-21 DIAGNOSIS — I10 Essential (primary) hypertension: Secondary | ICD-10-CM | POA: Insufficient documentation

## 2021-09-21 DIAGNOSIS — D6869 Other thrombophilia: Secondary | ICD-10-CM | POA: Diagnosis not present

## 2021-09-21 DIAGNOSIS — I779 Disorder of arteries and arterioles, unspecified: Secondary | ICD-10-CM | POA: Diagnosis not present

## 2021-09-21 DIAGNOSIS — Z7901 Long term (current) use of anticoagulants: Secondary | ICD-10-CM | POA: Diagnosis not present

## 2021-09-21 DIAGNOSIS — Z79899 Other long term (current) drug therapy: Secondary | ICD-10-CM | POA: Diagnosis not present

## 2021-09-21 DIAGNOSIS — I480 Paroxysmal atrial fibrillation: Secondary | ICD-10-CM | POA: Insufficient documentation

## 2021-09-21 LAB — BASIC METABOLIC PANEL
Anion gap: 8 (ref 5–15)
BUN: 13 mg/dL (ref 8–23)
CO2: 26 mmol/L (ref 22–32)
Calcium: 9.1 mg/dL (ref 8.9–10.3)
Chloride: 104 mmol/L (ref 98–111)
Creatinine, Ser: 1.16 mg/dL (ref 0.61–1.24)
GFR, Estimated: >60 mL/min (ref >60)
Glucose, Bld: 142 mg/dL — ABNORMAL HIGH (ref 70–99)
Potassium: 4.7 mmol/L (ref 3.5–5.1)
Sodium: 138 mmol/L (ref 135–145)

## 2021-09-21 LAB — CBC
HCT: 42.7 % (ref 39.0–52.0)
Hemoglobin: 13.7 g/dL (ref 13.0–17.0)
MCH: 31.4 pg (ref 26.0–34.0)
MCHC: 32.1 g/dL (ref 30.0–36.0)
MCV: 97.9 fL (ref 80.0–100.0)
Platelets: 205 10*3/uL (ref 150–400)
RBC: 4.36 MIL/uL (ref 4.22–5.81)
RDW: 14.2 % (ref 11.5–15.5)
WBC: 9 10*3/uL (ref 4.0–10.5)
nRBC: 0 % (ref 0.0–0.2)

## 2021-09-21 LAB — MAGNESIUM: Magnesium: 2.2 mg/dL (ref 1.7–2.4)

## 2021-09-21 MED ORDER — DILTIAZEM HCL 30 MG PO TABS: ORAL_TABLET | ORAL | 1 refills | Status: DC

## 2021-09-21 NOTE — Progress Notes (Signed)
Patient ID: [name], male   DOB: May 11, 1946, 76 y.o.   MRN: [id number]     Primary Care Physician: Celene Squibb, MD Referring Physician:Dr. Woodford Strege is a 76 y.o. male with a h/o PAF that underwent tikosyn loading at [facility] 01/10/16 . He has held in [place] very nicely . His grandson dies last week and after the service did noticed some fast irregular heart besat but only lasted around one hour.   No change form his usual health. 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 Tries to stay hydrated. Has gotten both covid shots.   BP elevated today but at home systolic is around 970-263 systolic. Just took meds about an hour ago. Heart rate is in the upper 40's today. Not symptomatic with this. He is not on AV nodal drugs. He sees HR 's in the 60's at home.    F/u in afib clinic, 09/10/19. He is s/p decompressive laminectomy  12/13. He is recovering nicely. He is staying in [place]. Qt stable. He is continuing on Tikosyn 500 mcg bid.   F/u in afib clinic, 03/08/21 for Tikosyn surveillance. He reports that he is staying in rhythm. Continues on Tikosyn. No issues with xarelto 20 mg daily for a CHA2DS2VASc  of 4. He had back surgery in December with relief of pain and had covid in February 2022 and did not require hospitalization. Mostly had cough. No pneumonia by CXR.   F/u afib clinic, 09/21/21 for tikosyn surveillance. He contiues in SR. No afib to report. Compliant with tikosyn. He is still working full time as an Clinical biochemist. Stays very busy.    Today, he denies symptoms of palpitations, chest pain, shortness of breath, orthopnea, PND, lower extremity edema, dizziness, presyncope, syncope, or neurologic sequela. The patient is tolerating medications without difficulties and is otherwise without complaint today.   Past Medical History:  Diagnosis Date   AAA (abdominal aortic aneurysm)    06/27/20 MRI: 3.0 cm infrarenal AAA, recommend 3 year follow-up    Anxiety    Atrial fibrillation ([place])    Bilateral carotid artery disease ([place]) 07/21/2013   CAD (coronary artery disease) 2008   CABG   COPD (chronic obstructive pulmonary disease) (HCC)    Dysrhythmia    A-fib   Gastritis    Headache(784.0)    Hypertension    Visit for monitoring Tikosyn therapy 12/2015   Past Surgical History:  Procedure Laterality Date   CARDIOVERSION N/A 09/08/2015   Procedure: CARDIOVERSION;  Surgeon: Thayer Headings, MD;  Location: [place];  Service: Cardiovascular;  Laterality: N/A;   CORONARY ARTERY BYPASS GRAFT     EXCISION OF KELOID Left 10/08/2018   Procedure: EXCISION OF CHRONIC ABDOMINAL WALL WOUND;  Surgeon: Coralie Keens, MD;  Location: [place];  Service: General;  Laterality: Left;   INCISION AND DRAINAGE DEEP NECK ABSCESS     LUMBAR LAMINECTOMY/DECOMPRESSION MICRODISCECTOMY Right 08/09/2020   Procedure: Laminectomy and Foraminotomy - Lumbar three-Lumbar four - Lumbar four-Lumbar five- right;  Surgeon: Kary Kos, MD;  Location: [place];  Service: Neurosurgery;  Laterality: Right;   ROTATOR CUFF REPAIR Bilateral    TEE WITHOUT CARDIOVERSION N/A 08/02/2015   Procedure: TRANSESOPHAGEAL ECHOCARDIOGRAM (TEE);  Surgeon: Sueanne Margarita, MD;  Location: [facility] ENDOSCOPY;  Service: Cardiovascular;  Laterality: N/A;   TEE WITHOUT CARDIOVERSION N/A 09/08/2015   Procedure: TRANSESOPHAGEAL ECHOCARDIOGRAM (TEE);  Surgeon: Thayer Headings, MD;  Location: [facility]  ENDOSCOPY;  Service: Cardiovascular;  Laterality: N/A;    Current Outpatient Medications  Medication Sig Dispense Refill   ALPRAZolam (XANAX) 0.25 MG tablet TAKE 1 TABLET(0.25 MG) BY MOUTH DAILY 10 tablet 0   amLODipine (NORVASC) 5 MG tablet TAKE 1 TABLET(5 MG) BY MOUTH DAILY 30 tablet 11   atorvastatin (LIPITOR) 40 MG tablet TAKE 1 TABLET(40 MG) BY MOUTH DAILY 90 tablet 1   diltiazem (CARDIZEM) 30 MG tablet Take 1 tablet every 4 hours AS NEEDED for heart rate >100 as long as blood pressure  >100. 45 tablet 1   dofetilide (TIKOSYN) 500 MCG capsule Take 1 capsule (500 mcg total) by mouth 2 (two) times daily. 180 capsule 2   gabapentin (NEURONTIN) 300 MG capsule Take 1 capsule (300 mg total) by mouth at bedtime. 30 capsule 1   lisinopril (ZESTRIL) 20 MG tablet TAKE 1 TABLET(20 MG) BY MOUTH DAILY 90 tablet 1   Multiple Vitamins-Minerals (MULTIVITAMIN WITH MINERALS) tablet Take 1 tablet by mouth at bedtime.      Omega-3 Fatty Acids (FISH OIL) 1000 MG CPDR Take 1,000 mg by mouth at bedtime.      potassium chloride (KLOR-CON) 10 MEQ tablet TAKE 1 TABLET(10 MEQ) BY MOUTH DAILY 90 tablet 0   vitamin C (ASCORBIC ACID) 500 MG tablet Take 500 mg by mouth at bedtime.      XARELTO 20 MG TABS tablet TAKE 1 TABLET(20 MG) BY MOUTH DAILY WITH SUPPER 30 tablet 11   No current facility-administered medications for this encounter.    Allergies  Allergen Reactions   Doxycycline Shortness Of Breath    Also has to urinate more often   Nsaids Hypertension    Gastritis     Social History   Socioeconomic History   Marital status: Married    Spouse name: Not on file   Number of children: Not on file   Years of education: Not on file   Highest education level: Not on file  Occupational History   Not on file  Tobacco Use   Smoking status: Former   Smokeless tobacco: Former    Types: Nurse, children's Use: Never used  Substance and Sexual Activity   Alcohol use: No    Comment: quit drinking in the 70's   Drug use: No   Sexual activity: Not on file  Other Topics Concern   Not on file  Social History Narrative   Not on file   Social Determinants of Health   Financial Resource Strain: Not on file  Food Insecurity: Not on file  Transportation Needs: Not on file  Physical Activity: Not on file  Stress: Not on file  Social Connections: Not on file  Intimate Partner Violence: Not on file    Family History  Problem Relation Age of Onset   Diabetes Mother    Hypertension  Mother    Heart attack Father    Sudden death Father    Hypertension Sister    Diabetes Sister     ROS- All systems are reviewed and negative except as per the HPI above  Physical Exam: Vitals:   09/21/21 1404  Pulse: (!) 59  Weight: 66.2 kg  Height: 5\' 5"  (1.651 m)    GEN- The patient is well appearing, alert and oriented x 3 today.   Head- normocephalic, atraumatic Eyes-  Sclera clear, conjunctiva pink Ears- hearing intact Oropharynx- clear Neck- supple, no JVP Lymph- no cervical lymphadenopathy Lungs- Clear to ausculation bilaterally, normal work of  breathing Heart- Regular rate and rhythm, no murmurs, rubs or gallops, PMI not laterally displaced GI- soft, NT, ND, + BS Extremities- no clubbing, cyanosis, or edema MS- no significant deformity or atrophy Skin- no rash or lesion Psych- euthymic mood, full affect Neuro- strength and sensation are intact  EKG- Vent. rate 59 BPM PR interval 164 ms QRS duration 80 ms QT/QTcB 442/437 ms P-R-T axes 86 34 66 Sinus bradycardia with Premature atrial complexes with Abberant conduction Septal infarct , age undetermined Abnormal ECG When compared with ECG of 10-Mar-2021 09:38, PREVIOUS ECG IS PRESENT Referred by: Loman Chroman  Epic records reviewed  Assessment and Plan: 1. Paroxysmal atrial fibrillation Has been staying in SR  Continue tikosyn 500 mg bid Continue xarelto 20 mg daily with CHA2DS2VASc score of 2  Bmet/mag/cbc today   2. H/o Carotid artery disease  Per Dr. Marlou Porch  3. HTN Stable   F/u in 6 months    Geroge Baseman. Shyane Fossum, [facility] [address] [place], [place] [address] [phone]

## 2021-09-22 ENCOUNTER — Other Ambulatory Visit (HOSPITAL_COMMUNITY): Payer: Self-pay | Admitting: *Deleted

## 2021-09-22 MED ORDER — POTASSIUM CHLORIDE ER 10 MEQ PO TBCR: EXTENDED_RELEASE_TABLET | ORAL | 3 refills | Status: DC

## 2021-09-23 DIAGNOSIS — I10 Essential (primary) hypertension: Secondary | ICD-10-CM | POA: Diagnosis not present

## 2021-09-23 DIAGNOSIS — R7303 Prediabetes: Secondary | ICD-10-CM | POA: Diagnosis not present

## 2021-09-27 ENCOUNTER — Other Ambulatory Visit (HOSPITAL_COMMUNITY): Payer: Self-pay | Admitting: *Deleted

## 2021-09-27 DIAGNOSIS — F411 Generalized anxiety disorder: Secondary | ICD-10-CM | POA: Diagnosis not present

## 2021-09-27 DIAGNOSIS — I482 Chronic atrial fibrillation, unspecified: Secondary | ICD-10-CM | POA: Diagnosis not present

## 2021-09-27 DIAGNOSIS — Z125 Encounter for screening for malignant neoplasm of prostate: Secondary | ICD-10-CM | POA: Diagnosis not present

## 2021-09-27 DIAGNOSIS — E875 Hyperkalemia: Secondary | ICD-10-CM | POA: Diagnosis not present

## 2021-09-27 DIAGNOSIS — I10 Essential (primary) hypertension: Secondary | ICD-10-CM | POA: Diagnosis not present

## 2021-09-27 DIAGNOSIS — I480 Paroxysmal atrial fibrillation: Secondary | ICD-10-CM

## 2021-09-27 DIAGNOSIS — E782 Mixed hyperlipidemia: Secondary | ICD-10-CM | POA: Diagnosis not present

## 2021-09-27 DIAGNOSIS — R800 Isolated proteinuria: Secondary | ICD-10-CM | POA: Diagnosis not present

## 2021-09-27 DIAGNOSIS — M25551 Pain in right hip: Secondary | ICD-10-CM | POA: Diagnosis not present

## 2021-09-27 DIAGNOSIS — I25810 Atherosclerosis of coronary artery bypass graft(s) without angina pectoris: Secondary | ICD-10-CM | POA: Diagnosis not present

## 2021-09-27 DIAGNOSIS — R7303 Prediabetes: Secondary | ICD-10-CM | POA: Diagnosis not present

## 2021-10-13 DIAGNOSIS — I480 Paroxysmal atrial fibrillation: Secondary | ICD-10-CM | POA: Diagnosis not present

## 2021-10-18 ENCOUNTER — Other Ambulatory Visit (HOSPITAL_COMMUNITY): Payer: Self-pay | Admitting: Cardiology

## 2021-10-18 DIAGNOSIS — I6523 Occlusion and stenosis of bilateral carotid arteries: Secondary | ICD-10-CM

## 2021-12-20 ENCOUNTER — Other Ambulatory Visit (HOSPITAL_COMMUNITY): Payer: Self-pay | Admitting: Nurse Practitioner

## 2021-12-28 DIAGNOSIS — Z125 Encounter for screening for malignant neoplasm of prostate: Secondary | ICD-10-CM | POA: Diagnosis not present

## 2021-12-28 DIAGNOSIS — I10 Essential (primary) hypertension: Secondary | ICD-10-CM | POA: Diagnosis not present

## 2021-12-28 DIAGNOSIS — R7303 Prediabetes: Secondary | ICD-10-CM | POA: Diagnosis not present

## 2021-12-29 DIAGNOSIS — M79641 Pain in right hand: Secondary | ICD-10-CM | POA: Diagnosis not present

## 2021-12-29 DIAGNOSIS — M79642 Pain in left hand: Secondary | ICD-10-CM | POA: Diagnosis not present

## 2021-12-29 DIAGNOSIS — Z4789 Encounter for other orthopedic aftercare: Secondary | ICD-10-CM | POA: Diagnosis not present

## 2022-01-04 DIAGNOSIS — R7303 Prediabetes: Secondary | ICD-10-CM | POA: Diagnosis not present

## 2022-01-04 DIAGNOSIS — M25551 Pain in right hip: Secondary | ICD-10-CM | POA: Diagnosis not present

## 2022-01-04 DIAGNOSIS — E782 Mixed hyperlipidemia: Secondary | ICD-10-CM | POA: Diagnosis not present

## 2022-01-04 DIAGNOSIS — Z125 Encounter for screening for malignant neoplasm of prostate: Secondary | ICD-10-CM | POA: Diagnosis not present

## 2022-01-04 DIAGNOSIS — I482 Chronic atrial fibrillation, unspecified: Secondary | ICD-10-CM | POA: Diagnosis not present

## 2022-01-04 DIAGNOSIS — F411 Generalized anxiety disorder: Secondary | ICD-10-CM | POA: Diagnosis not present

## 2022-01-04 DIAGNOSIS — I10 Essential (primary) hypertension: Secondary | ICD-10-CM | POA: Diagnosis not present

## 2022-01-04 DIAGNOSIS — E875 Hyperkalemia: Secondary | ICD-10-CM | POA: Diagnosis not present

## 2022-01-04 DIAGNOSIS — R800 Isolated proteinuria: Secondary | ICD-10-CM | POA: Diagnosis not present

## 2022-01-04 DIAGNOSIS — I25810 Atherosclerosis of coronary artery bypass graft(s) without angina pectoris: Secondary | ICD-10-CM | POA: Diagnosis not present

## 2022-01-04 DIAGNOSIS — S61412A Laceration without foreign body of left hand, initial encounter: Secondary | ICD-10-CM | POA: Diagnosis not present

## 2022-01-12 ENCOUNTER — Telehealth: Payer: Self-pay

## 2022-01-12 NOTE — Telephone Encounter (Signed)
   Pre-operative Risk Assessment    Patient Name: [name]  DOB: 1946-02-04 MRN: [id number]      Request for Surgical Clearance    Procedure:   Colonoscopy  Date of Surgery:  Clearance 04/06/22                                 Surgeon:  Dr. Watt Climes Surgeon's Group or Practice Name:  [facility] Phone number:  [phone] Fax number:  [phone]   Type of Clearance Requested:   - Medical  - Pharmacy:  Hold Rivaroxaban (Xarelto) x 3 days   Type of Anesthesia:   Propofol   Additional requests/questions:   None  Signed, Bransen Fassnacht   01/12/2022, 1:53 PM

## 2022-01-12 NOTE — Telephone Encounter (Signed)
Patient with diagnosis of afib on Xarelto for anticoagulation.    Procedure: colonoscopy Date of procedure: 04/06/22  CHA2DS2-VASc Score = 4  This indicates a 4.8% annual risk of stroke. The patient's score is based upon: CHF History: 0 HTN History: 1 Diabetes History: 0 Stroke History: 0 Vascular Disease History: 1 Age Score: 2 Gender Score: 0  CrCl 63m/min Platelet count 205K  Typically only hold Xarelto for 1-2 days prior to colonoscopy, 3 day hold shouldn't be needed. Would be ok with 2 day hold.

## 2022-01-13 NOTE — Telephone Encounter (Signed)
    Name: [name]  DOB: 07/01/1946  MRN: [id number]  Primary Cardiologist: Candee Furbish, MD  Last OV 08/2021, would plan for virtual visit no later than July to allow adequate timing of review in case additional workup needed.  Preoperative team, please contact this patient and set up a phone call appointment for further preoperative risk assessment. Please obtain consent and complete medication review. Thank you for your help.  I confirm that guidance regarding antiplatelet and oral anticoagulation therapy has been completed and, if necessary, noted below.    Charlie Pitter, PA-C 01/13/2022, 2:03 PM [facility] [address] [place], [place] [address]

## 2022-01-17 ENCOUNTER — Telehealth: Payer: Self-pay

## 2022-01-17 NOTE — Telephone Encounter (Signed)
  Patient Consent for Virtual Visit        [name] has provided verbal consent on 01/17/2022 for a virtual visit (video or telephone).   CONSENT FOR VIRTUAL VISIT FOR:  Raymondo Moyer  By participating in this virtual visit I agree to the following:  I hereby voluntarily request, consent and authorize [facility] and its employed or contracted physicians, physician assistants, nurse practitioners or other licensed health care professionals (the Practitioner), to provide me with telemedicine health care services (the "Services") as deemed necessary by the treating Practitioner. I acknowledge and consent to receive the Services by the Practitioner via telemedicine. I understand that the telemedicine visit will involve communicating with the Practitioner through live audiovisual communication technology and the disclosure of certain medical information by electronic transmission. I acknowledge that I have been given the opportunity to request an in-person assessment or other available alternative prior to the telemedicine visit and am voluntarily participating in the telemedicine visit.  I understand that I have the right to withhold or withdraw my consent to the use of telemedicine in the course of my care at any time, without affecting my right to future care or treatment, and that the Practitioner or I may terminate the telemedicine visit at any time. I understand that I have the right to inspect all information obtained and/or recorded in the course of the telemedicine visit and may receive copies of available information for a reasonable fee.  I understand that some of the potential risks of receiving the Services via telemedicine include:  Delay or interruption in medical evaluation due to technological equipment failure or disruption; Information transmitted may not be sufficient (e.g. poor resolution of images) to allow for appropriate medical decision making by the Practitioner; and/or   In rare instances, security protocols could fail, causing a breach of personal health information.  Furthermore, I acknowledge that it is my responsibility to provide information about my medical history, conditions and care that is complete and accurate to the best of my ability. I acknowledge that Practitioner's advice, recommendations, and/or decision may be based on factors not within their control, such as incomplete or inaccurate data provided by me or distortions of diagnostic images or specimens that may result from electronic transmissions. I understand that the practice of medicine is not an exact science and that Practitioner makes no warranties or guarantees regarding treatment outcomes. I acknowledge that a copy of this consent can be made available to me via my patient portal ([facility]), or I can request a printed copy by calling the office of [facility].    I understand that my insurance will be billed for this visit.   I have read or had this consent read to me. I understand the contents of this consent, which adequately explains the benefits and risks of the Services being provided via telemedicine.  I have been provided ample opportunity to ask questions regarding this consent and the Services and have had my questions answered to my satisfaction. I give my informed consent for the services to be provided through the use of telemedicine in my medical care

## 2022-01-17 NOTE — Telephone Encounter (Addendum)
Pt is scheduled for a tele preop for clearance on 5/24. Consent and med rec reviewed.

## 2022-01-18 ENCOUNTER — Other Ambulatory Visit (HOSPITAL_COMMUNITY): Payer: Self-pay | Admitting: *Deleted

## 2022-01-18 MED ORDER — DOFETILIDE 500 MCG PO CAPS: ORAL_CAPSULE | ORAL | 1 refills | Status: DC

## 2022-01-19 ENCOUNTER — Ambulatory Visit (INDEPENDENT_AMBULATORY_CARE_PROVIDER_SITE_OTHER): Payer: Medicare Other | Admitting: General Practice

## 2022-01-19 DIAGNOSIS — Z01810 Encounter for preprocedural cardiovascular examination: Secondary | ICD-10-CM

## 2022-01-19 NOTE — Progress Notes (Signed)
Virtual Visit via Telephone Note   Because of [name]'s co-morbid illnesses, he is at least at moderate risk for complications without adequate follow up.  This format is felt to be most appropriate for this patient at this time.  The patient did not have access to video technology/had technical difficulties with video requiring transitioning to audio format only (telephone).  All issues noted in this document were discussed and addressed.  No physical exam could be performed with this format.  Please refer to the patient's chart for his consent to telehealth for [facility].  Evaluation Performed:  Preoperative cardiovascular risk assessment _____________   Date:  01/19/2022   Patient ID:  [name], DOB 1946-01-01, MRN [id number] Patient Location:  Home Provider location:   Office  Primary Care Provider:  Celene Squibb, MD Primary Cardiologist:  Candee Furbish, MD  Chief Complaint / Patient Profile   76 y.o. y/o male with a h/o AAA, anxiety, atrial fibrillation, coronary artery disease status post CABG, COPD who is pending colonoscopy and presents today for telephonic preoperative cardiovascular risk assessment.  Past Medical History    Past Medical History:  Diagnosis Date   AAA (abdominal aortic aneurysm)    06/27/20 MRI: 3.0 cm infrarenal AAA, recommend 3 year follow-up   Anxiety    Atrial fibrillation ([place])    Bilateral carotid artery disease ([place]) 07/21/2013   CAD (coronary artery disease) 2008   CABG   COPD (chronic obstructive pulmonary disease) (H[facility])    Dysrhythmia    A-fib   Gastritis    Headache(784.0)    Hypertension    Visit for monitoring Tikosyn therapy 12/2015   Past Surgical History:  Procedure Laterality Date   CARDIOVERSION N/A 09/08/2015   Procedure: CARDIOVERSION;  Surgeon: Thayer Headings, MD;  Location: [place];  Service: Cardiovascular;  Laterality: N/A;   CORONARY ARTERY BYPASS GRAFT     EXCISION OF KELOID Left 10/08/2018    Procedure: EXCISION OF CHRONIC ABDOMINAL WALL WOUND;  Surgeon: Coralie Keens, MD;  Location: [place];  Service: General;  Laterality: Left;   INCISION AND DRAINAGE DEEP NECK ABSCESS     LUMBAR LAMINECTOMY/DECOMPRESSION MICRODISCECTOMY Right 08/09/2020   Procedure: Laminectomy and Foraminotomy - Lumbar three-Lumbar four - Lumbar four-Lumbar five- right;  Surgeon: Kary Kos, MD;  Location: [place];  Service: Neurosurgery;  Laterality: Right;   ROTATOR CUFF REPAIR Bilateral    TEE WITHOUT CARDIOVERSION N/A 08/02/2015   Procedure: TRANSESOPHAGEAL ECHOCARDIOGRAM (TEE);  Surgeon: Sueanne Margarita, MD;  Location: [facility] ENDOSCOPY;  Service: Cardiovascular;  Laterality: N/A;   TEE WITHOUT CARDIOVERSION N/A 09/08/2015   Procedure: TRANSESOPHAGEAL ECHOCARDIOGRAM (TEE);  Surgeon: Thayer Headings, MD;  Location[facility];  Service: Cardiovascular;  Laterality: N/A;    Allergies  Allergies  Allergen Reactions   Doxycycline Shortness Of Breath    Also has to urinate more often   Nsaids Hypertension    Gastritis     History of Present Illness    [name] is a 76 y.o. male who presents via audio/video conferencing for a telehealth visit today.  Pt was last seen in cardiology clinic on 09/15/2021 by Dr. Marlou Porch.  At that time [name] was doing well .  The patient is now pending procedure as outlined above. Since his last visit, he remains stable from a cardiac standpoint.  Today he denies chest pain, shortness of breath, lower extremity edema, fatigue, palpitations, melena, hematuria, hemoptysis, diaphoresis, weakness, presyncope, syncope,  orthopnea, and PND.   Home Medications    Prior to Admission medications   Medication Sig Start Date End Date Taking? Authorizing Provider  ALPRAZolam Duanne Moron) 0.25 MG tablet TAKE 1 TABLET(0.25 MG) BY MOUTH DAILY 11/12/20   Lovena Le, Malena M, DO  amLODipine (NORVASC) 5 MG tablet TAKE 1 TABLET(5 MG) BY MOUTH DAILY 05/18/21   Sherran Needs,  NP  atorvastatin (LIPITOR) 40 MG tablet TAKE 1 TABLET(40 MG) BY MOUTH DAILY 06/03/20   Lovena Le, Malena M, DO  diltiazem (CARDIZEM) 30 MG tablet Take 1 tablet every 4 hours AS NEEDED for heart rate >100 as long as blood pressure >100. 09/21/21   Sherran Needs, NP  dofetilide (TIKOSYN) 500 MCG capsule TAKE 1 CAPSULE(500 MCG) BY MOUTH TWICE DAILY 01/18/22   Sherran Needs, NP  gabapentin (NEURONTIN) 300 MG capsule Take 1 capsule (300 mg total) by mouth at bedtime. 05/20/20   Mcarthur Rossetti, MD  lisinopril (ZESTRIL) 20 MG tablet TAKE 1 TABLET(20 MG) BY MOUTH DAILY 06/03/20   Elvia Collum M, DO  Multiple Vitamins-Minerals (MULTIVITAMIN WITH MINERALS) tablet Take 1 tablet by mouth at bedtime.     [provider]  Omega-3 Fatty Acids (FISH OIL) 1000 MG CPDR Take 1,000 mg by mouth at bedtime.     [provider]  potassium chloride (KLOR-CON) 10 MEQ tablet TAKE 1 TABLET(10 MEQ) BY MOUTH DAILY 09/22/21   Sherran Needs, NP  vitamin C (ASCORBIC ACID) 500 MG tablet Take 500 mg by mouth at bedtime.     [provider]  XARELTO 20 MG TABS tablet TAKE 1 TABLET(20 MG) BY MOUTH DAILY WITH SUPPER 05/18/21   Sherran Needs, NP    Physical Exam    Vital Signs:  [name] does not have vital signs available for review today.  Given telephonic nature of communication, physical exam is limited. AAOx3. NAD. Normal affect.  Speech and respirations are unlabored.  Accessory Clinical Findings    None  Assessment & Plan    1.  Preoperative Cardiovascular Risk Assessment: Colonoscopy,Dr. Magod     Primary Cardiologist: Candee Furbish, MD  Chart reviewed as part of pre-operative protocol coverage. Given past medical history and time since last visit, based on ACC/AHA guidelines, DARIL WARGA would be at acceptable risk for the planned procedure without further cardiovascular testing.   Patient was advised that if he develops new symptoms prior to surgery to contact our  office to arrange a follow-up appointment.  He verbalized understanding.  Patient with diagnosis of afib on Xarelto for anticoagulation.     Procedure: colonoscopy Date of procedure: 04/06/22   CHA2DS2-VASc Score = 4  This indicates a 4.8% annual risk of stroke. The patient's score is based upon: CHF History: 0 HTN History: 1 Diabetes History: 0 Stroke History: 0 Vascular Disease History: 1 Age Score: 2 Gender Score: 0   CrCl 61m/min Platelet count 205K   Typically only hold Xarelto for 1-2 days prior to colonoscopy, 3 day hold shouldn't be needed. Would be ok with 2 day hold.   A copy of this note will be routed to requesting surgeon.  Time:   Today, I have spent 5 minutes with the patient with telehealth technology discussing medical history, symptoms, and management plan.  Prior to the phone evaluation spent greater than 10 minutes reviewing the patient's past medical history and medications.   JDeberah Pelton NP  01/19/2022, 7:16 AM

## 2022-02-03 DIAGNOSIS — M722 Plantar fascial fibromatosis: Secondary | ICD-10-CM | POA: Diagnosis not present

## 2022-02-23 ENCOUNTER — Ambulatory Visit (HOSPITAL_COMMUNITY): Payer: Medicare Other | Admitting: Nurse Practitioner

## 2022-02-23 ENCOUNTER — Encounter (HOSPITAL_COMMUNITY): Payer: Self-pay | Admitting: Nurse Practitioner

## 2022-02-23 ENCOUNTER — Ambulatory Visit (HOSPITAL_COMMUNITY)
Admission: RE | Admit: 2022-02-23 | Discharge: 2022-02-23 | Disposition: A | Discharge status: Home / Self Care | Payer: Medicare Other | Source: Ambulatory Visit | Attending: Nurse Practitioner | Admitting: Nurse Practitioner

## 2022-02-23 VITALS — BP 128/62 | HR 60 | Ht 65.0 in | Wt 141.2 lb

## 2022-02-23 DIAGNOSIS — I10 Essential (primary) hypertension: Secondary | ICD-10-CM | POA: Diagnosis not present

## 2022-02-23 DIAGNOSIS — Z79899 Other long term (current) drug therapy: Secondary | ICD-10-CM | POA: Diagnosis not present

## 2022-02-23 DIAGNOSIS — I480 Paroxysmal atrial fibrillation: Secondary | ICD-10-CM | POA: Insufficient documentation

## 2022-02-23 DIAGNOSIS — D6869 Other thrombophilia: Secondary | ICD-10-CM

## 2022-02-23 DIAGNOSIS — Z7901 Long term (current) use of anticoagulants: Secondary | ICD-10-CM | POA: Diagnosis not present

## 2022-02-23 DIAGNOSIS — I4892 Unspecified atrial flutter: Secondary | ICD-10-CM | POA: Diagnosis not present

## 2022-02-23 DIAGNOSIS — Z8616 Personal history of COVID-19: Secondary | ICD-10-CM | POA: Diagnosis not present

## 2022-02-23 LAB — BASIC METABOLIC PANEL
Anion gap: 7 (ref 5–15)
BUN: 14 mg/dL (ref 8–23)
CO2: 26 mmol/L (ref 22–32)
Calcium: 9.2 mg/dL (ref 8.9–10.3)
Chloride: 107 mmol/L (ref 98–111)
Creatinine, Ser: 1.13 mg/dL (ref 0.61–1.24)
GFR, Estimated: >60 mL/min (ref >60)
Glucose, Bld: 78 mg/dL (ref 70–99)
Potassium: 4.5 mmol/L (ref 3.5–5.1)
Sodium: 140 mmol/L (ref 135–145)

## 2022-02-23 LAB — MAGNESIUM: Magnesium: 2.4 mg/dL (ref 1.7–2.4)

## 2022-02-23 NOTE — Progress Notes (Signed)
Patient ID: ANSON PEDDIE, male   DOB: 1946-06-25, 76 y.o.   MRN: [id number]     Primary Care Physician: Celene Squibb, MD Referring Physician:Dr. Rynell Ciotti is a 76 y.o. male with a h/o PAF that underwent tikosyn loading at [facility] 01/10/16 . He has held in [place] very nicely . His grandson dies last week and after the service did noticed some fast irregular heart besat but only lasted around one hour.   No change form his usual health. 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 Tries to stay hydrated. Has gotten both covid shots.   BP elevated today but at home systolic is around 941-740 systolic. Just took meds about an hour ago. Heart rate is in the upper 40's today. Not symptomatic with this. He is not on AV nodal drugs. He sees HR 's in the 60's at home.    F/u in afib clinic, 09/10/19. He is s/p decompressive laminectomy  12/13. He is recovering nicely. He is staying in [place]. Qt stable. He is continuing on Tikosyn 500 mcg bid.   F/u in afib clinic, 03/08/21 for Tikosyn surveillance. He reports that he is staying in rhythm. Continues on Tikosyn. No issues with xarelto 20 mg daily for a CHA2DS2VASc  of 4. He had back surgery in December with relief of pain and had covid in February 2022 and did not require hospitalization. Mostly had cough. No pneumonia by CXR.   F/u afib clinic, 09/21/21 for tikosyn surveillance. He contiues in [place]. No afib to report. Compliant with tikosyn. He is still working full time as an Clinical biochemist. Stays very busy.   F/u in afib clinic, 02/23/22. He is in for tikosyn surveillance and has no afib to report. He has developed some nerve pain of rt hip which has been bothersome for him. Still working as hard as ever.    Today, he denies symptoms of palpitations, chest pain, shortness of breath, orthopnea, PND, lower extremity edema, dizziness, presyncope, syncope, or neurologic sequela. The patient is tolerating medications without  difficulties and is otherwise without complaint today.   Past Medical History:  Diagnosis Date   AAA (abdominal aortic aneurysm) ([place])    06/27/20 MRI: 3.0 cm infrarenal AAA, recommend 3 year follow-up   Anxiety    Atrial fibrillation ([place])    Bilateral carotid artery disease ([place]) 07/21/2013   CAD (coronary artery disease) 2008   CABG   COPD (chronic obstructive pulmonary disease) (H[facility])    Dysrhythmia    A-fib   Gastritis    Headache(784.0)    Hypertension    Visit for monitoring Tikosyn therapy 12/2015   Past Surgical History:  Procedure Laterality Date   CARDIOVERSION N/A 09/08/2015   Procedure: CARDIOVERSION;  Surgeon: Thayer Headings, MD;  Location: [place];  Service: Cardiovascular;  Laterality: N/A;   CORONARY ARTERY BYPASS GRAFT     EXCISION OF KELOID Left 10/08/2018   Procedure: EXCISION OF CHRONIC ABDOMINAL WALL WOUND;  Surgeon: Coralie Keens, MD;  Location: [place];  Service: General;  Laterality: Left;   INCISION AND DRAINAGE DEEP NECK ABSCESS     LUMBAR LAMINECTOMY/DECOMPRESSION MICRODISCECTOMY Right 08/09/2020   Procedure: Laminectomy and Foraminotomy - Lumbar three-Lumbar four - Lumbar four-Lumbar five- right;  Surgeon: Kary Kos, MD;  Location: [place];  Service: Neurosurgery;  Laterality: Right;   ROTATOR CUFF REPAIR Bilateral    TEE WITHOUT CARDIOVERSION N/A 08/02/2015   Procedure:  TRANSESOPHAGEAL ECHOCARDIOGRAM (TEE);  Surgeon: Sueanne Margarita, MD;  Location: [facility] ENDOSCOPY;  Service: Cardiovascular;  Laterality: N/A;   TEE WITHOUT CARDIOVERSION N/A 09/08/2015   Procedure: TRANSESOPHAGEAL ECHOCARDIOGRAM (TEE);  Surgeon: Thayer Headings, MD;  [facility] ENDOSCOPY;  Service: Cardiovascular;  Laterality: N/A;    Current Outpatient Medications  Medication Sig Dispense Refill   ALPRAZolam (XANAX) 0.25 MG tablet TAKE 1 TABLET(0.25 MG) BY MOUTH DAILY 10 tablet 0   amLODipine (NORVASC) 5 MG tablet TAKE 1 TABLET(5 MG) BY MOUTH DAILY 30 tablet 11    atorvastatin (LIPITOR) 40 MG tablet TAKE 1 TABLET(40 MG) BY MOUTH DAILY 90 tablet 1   diltiazem (CARDIZEM) 30 MG tablet Take 1 tablet every 4 hours AS NEEDED for heart rate >100 as long as blood pressure >100. 45 tablet 1   dofetilide (TIKOSYN) 500 MCG capsule TAKE 1 CAPSULE(500 MCG) BY MOUTH TWICE DAILY 180 capsule 1   gabapentin (NEURONTIN) 300 MG capsule Take 1 capsule (300 mg total) by mouth at bedtime. 30 capsule 1   lisinopril (ZESTRIL) 20 MG tablet TAKE 1 TABLET(20 MG) BY MOUTH DAILY 90 tablet 1   Multiple Vitamins-Minerals (MULTIVITAMIN WITH MINERALS) tablet Take 1 tablet by mouth at bedtime.      Omega-3 Fatty Acids (FISH OIL) 1000 MG CPDR Take 1,000 mg by mouth at bedtime.      potassium chloride (KLOR-CON) 10 MEQ tablet TAKE 1 TABLET(10 MEQ) BY MOUTH DAILY 90 tablet 3   vitamin C (ASCORBIC ACID) 500 MG tablet Take 500 mg by mouth at bedtime.      XARELTO 20 MG TABS tablet TAKE 1 TABLET(20 MG) BY MOUTH DAILY WITH SUPPER 30 tablet 11   No current facility-administered medications for this encounter.    Allergies  Allergen Reactions   Doxycycline Shortness Of Breath    Also has to urinate more often   Nsaids Hypertension    Gastritis     Social History   Socioeconomic History   Marital status: Married    Spouse name: Not on file   Number of children: Not on file   Years of education: Not on file   Highest education level: Not on file  Occupational History   Not on file  Tobacco Use   Smoking status: Former   Smokeless tobacco: Former    Types: Nurse, children's Use: Never used  Substance and Sexual Activity   Alcohol use: No    Comment: quit drinking in the 70's   Drug use: No   Sexual activity: Not on file  Other Topics Concern   Not on file  Social History Narrative   Not on file   Social Determinants of Health   Financial Resource Strain: Not on file  Food Insecurity: Not on file  Transportation Needs: Not on file  Physical Activity: Not on file   Stress: Not on file  Social Connections: Not on file  Intimate Partner Violence: Not on file    Family History  Problem Relation Age of Onset   Diabetes Mother    Hypertension Mother    Heart attack Father    Sudden death Father    Hypertension Sister    Diabetes Sister     ROS- All systems are reviewed and negative except as per the HPI above  Physical Exam: Vitals:   02/23/22 1044  BP: 128/62  Pulse: 60  Weight: 64 kg  Height: '5\' 5"'$  (1.651 m)    GEN- The patient is well  appearing, alert and oriented x 3 today.   Head- normocephalic, atraumatic Eyes-  Sclera clear, conjunctiva pink Ears- hearing intact Oropharynx- clear Neck- supple, no JVP Lymph- no cervical lymphadenopathy Lungs- Clear to ausculation bilaterally, normal work of breathing Heart- Regular rate and rhythm, no murmurs, rubs or gallops, PMI not laterally displaced GI- soft, NT, ND, + BS Extremities- no clubbing, cyanosis, or edema MS- no significant deformity or atrophy Skin- no rash or lesion Psych- euthymic mood, full affect Neuro- strength and sensation are intact  EKG -Vent. rate 60 BPM PR interval 176 ms QRS duration 82 ms QT/QTcB 458/458 ms P-R-T axes 94 26 112 Sinus rhythm with Premature supraventricular complexes Septal infarct , age undetermined Abnormal ECG When compared with ECG of 21-Sep-2021 14:12, PREVIOUS ECG IS PRESENT  Epic records reviewed  Assessment and Plan: 1. Paroxysmal atrial fibrillation Has been staying in [place]  Continue tikosyn 500 mg bid Continue xarelto 20 mg daily with CHA2DS2VASc score of 2  Bmet/mag show stable labs for Tikosyn use   2. H/o Carotid artery disease  Per Dr. Marlou Porch  3. HTN Stable   F/u in 6 months    Geroge Baseman. Kenetra Hildenbrand, [facility] [address] [place], [place] [address] [phone]

## 2022-04-06 DIAGNOSIS — Z09 Encounter for follow-up examination after completed treatment for conditions other than malignant neoplasm: Secondary | ICD-10-CM | POA: Diagnosis not present

## 2022-04-06 DIAGNOSIS — K649 Unspecified hemorrhoids: Secondary | ICD-10-CM | POA: Diagnosis not present

## 2022-04-06 DIAGNOSIS — D124 Benign neoplasm of descending colon: Secondary | ICD-10-CM | POA: Diagnosis not present

## 2022-04-06 DIAGNOSIS — K5730 Diverticulosis of large intestine without perforation or abscess without bleeding: Secondary | ICD-10-CM | POA: Diagnosis not present

## 2022-04-06 DIAGNOSIS — Z86010 Personal history of colonic polyps: Secondary | ICD-10-CM | POA: Diagnosis not present

## 2022-04-10 DIAGNOSIS — D124 Benign neoplasm of descending colon: Secondary | ICD-10-CM | POA: Diagnosis not present

## 2022-04-21 ENCOUNTER — Telehealth: Payer: Self-pay | Admitting: *Deleted

## 2022-04-21 ENCOUNTER — Encounter: Payer: Self-pay | Admitting: *Deleted

## 2022-04-21 NOTE — Patient Outreach (Signed)
  Care Coordination   Initial Visit Note   04/21/2022 Name: [name] MRN: [id number] DOB: April 06, 1946  [name] is a 76 y.o. year old male who sees Nevada Crane, Edwinna Areola, MD for primary care. I spoke with  [name] by phone today.  What matters to the patients health and wellness today?  Placed call to patient, reviewed [facility] care coordination program, patient consented to pharmacy services only, declines RN care coordination services.  Patient's concern today is "I would like help with costs of tikosyn and xarelto"    Goals Addressed               This Visit's Progress     "I would like help with costs of tikosyn and xarelto" (pt-stated)        Care Coordination Interventions: Advised patient to schedule Annual Wellness Visit with primary care provider Reviewed medications with patient and discussed taking all medications as prescribed Pharmacy referral for medication assistance for tikosyn and xarelto, patient reports medications are expensive and he would like any resources available Patient consented to work with pharmacist for medication assistance, patient states he works full time and in the car everyday for his job, states he is managing health conditions well and declines RN care coordination services          SDOH assessments and interventions completed:  Yes  SDOH Interventions Today    Flowsheet Row Most Recent Value  SDOH Interventions   Food Insecurity Interventions Intervention Not Indicated  Transportation Interventions Intervention Not Indicated        Care Coordination Interventions Activated:  Yes  Care Coordination Interventions:  Yes, provided   Follow up plan: Referral made to pharmacist    Encounter Outcome:  Pt. Visit Completed   Jacqlyn Larsen [facility], BSN RN Case Manager [phone]

## 2022-04-23 IMAGING — MR MR LUMBAR SPINE W/O CM
5 series · 30 of 48 positions shown · non-contrast
Comparison: Lumbar spine radiographs 05/20/2020

CLINICAL DATA: Right lower extremity pain and tingling for 3
months.

EXAM:
MRI LUMBAR SPINE WITHOUT CONTRAST
TECHNIQUE: Multiplanar, multisequence MR imaging of the lumbar spine was
performed. No intravenous contrast was administered.

[Series 5: T2 · sagittal · 4.0mm · 0.68mm/px · 6 of 13 slices shown (1 of 2)]
[im 1/13]
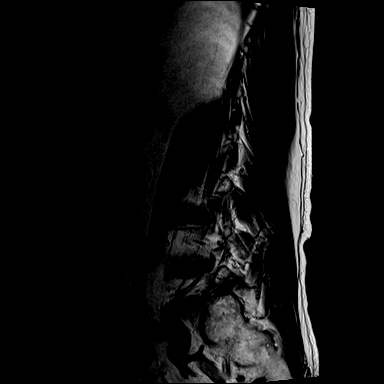
[im 3/13]
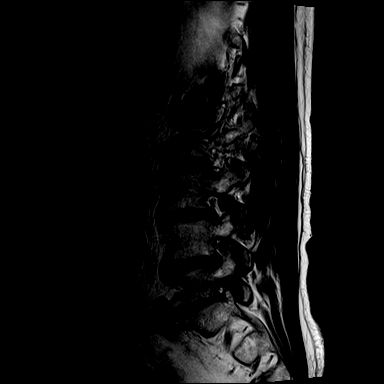
[im 5/13]
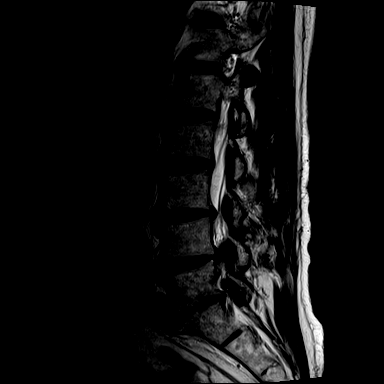
[im 8/13]
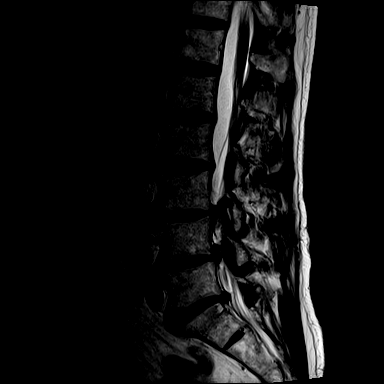
[im 10/13]
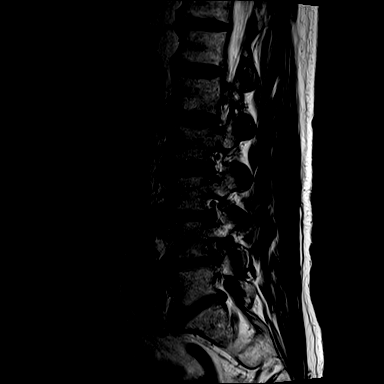
[im 13/13]
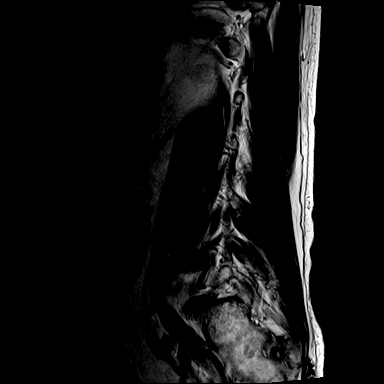

[Series 6: T1 · sagittal · 4.0mm · 0.81mm/px · 7 of 13 slices shown (1 of 2)]
[im 1/13]
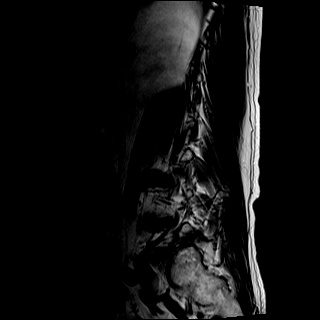
[im 3/13]
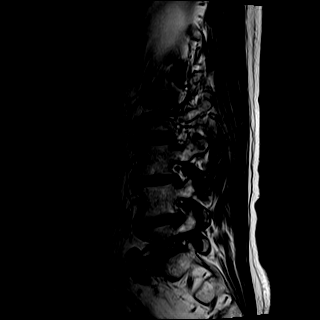
[im 5/13]
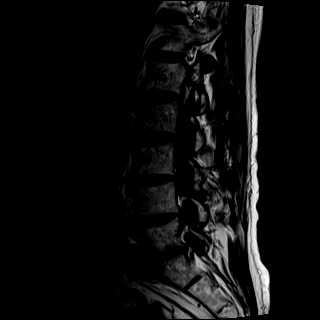
[im 7/13]
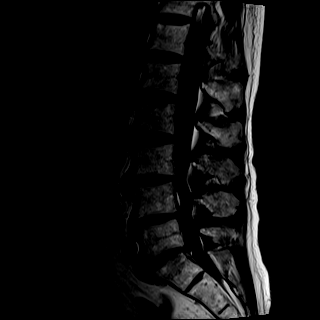
[im 9/13]
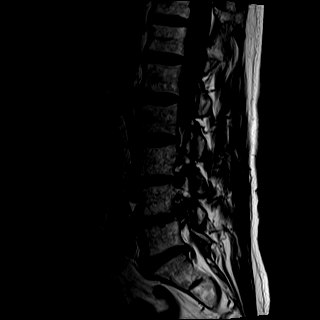
[im 11/13]
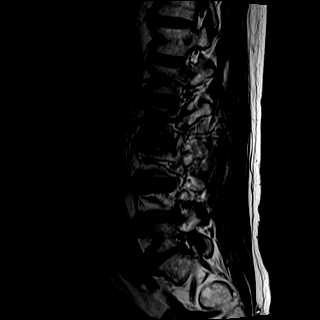
[im 13/13]
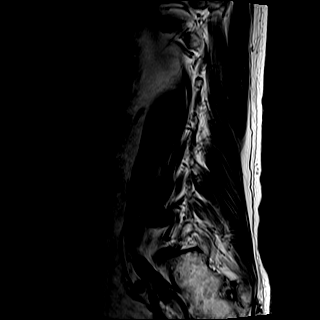

[Series 7: STIR · sagittal · 4.0mm · 0.51mm/px · 1 of 13 slices shown]
[im 1/13]
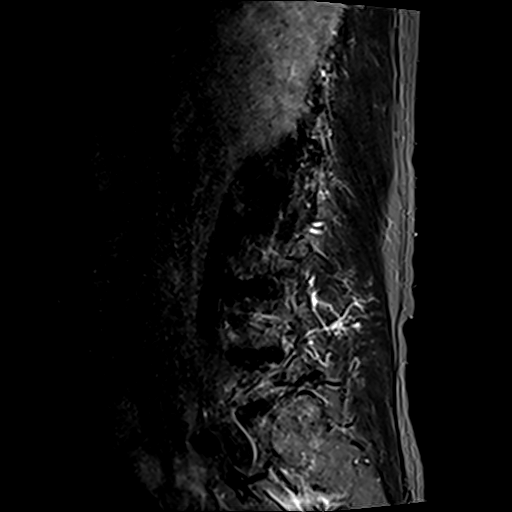

[Series 8: T2 · axial · 4.0mm · 0.70mm/px · z∈[-90,+70]mm · 8 of 26 slices shown (2 of 2)]
[im 1/26]
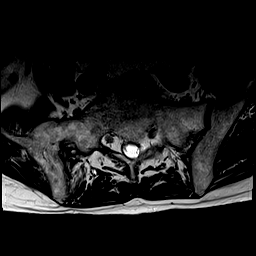
[im 4/26]
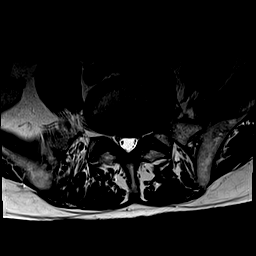
[im 8/26]
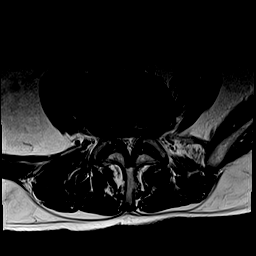
[im 12/26]
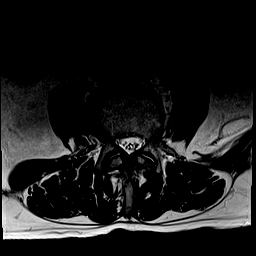
[im 14/26]
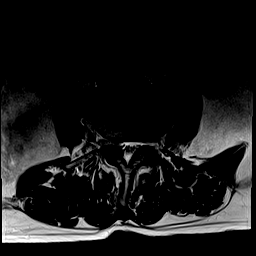
[im 18/26]
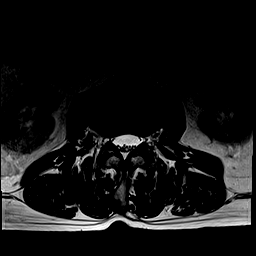
[im 22/26]
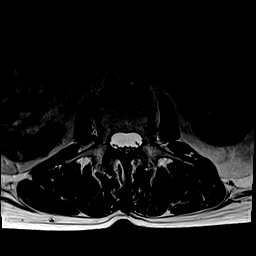
[im 26/26]
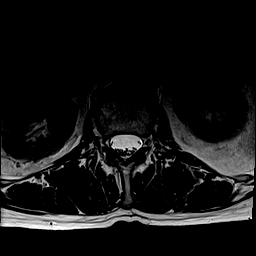

[Series 9: T1 · axial · 4.0mm · 0.35mm/px · z∈[-90,+70]mm · 8 of 26 slices shown (2 of 2)]
[im 1/26]
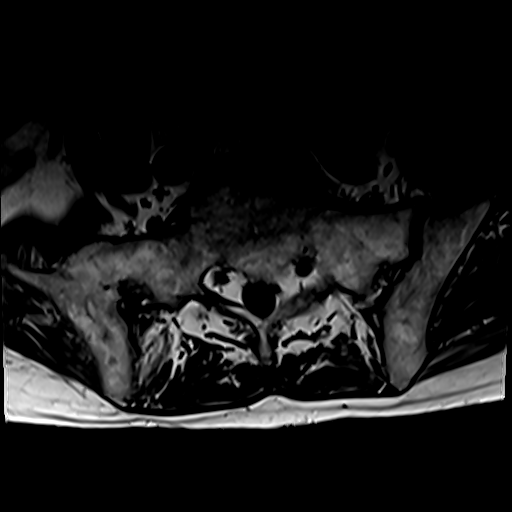
[im 4/26]
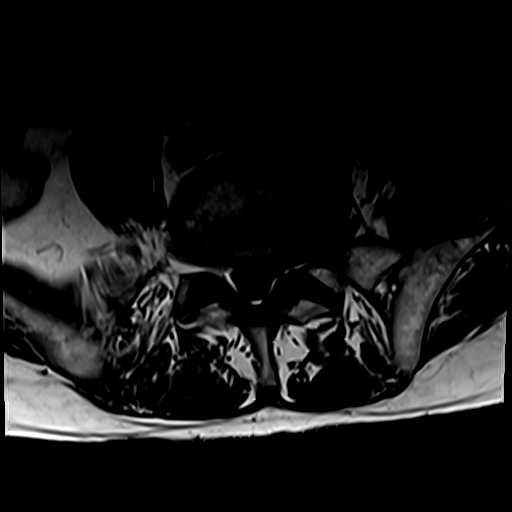
[im 8/26]
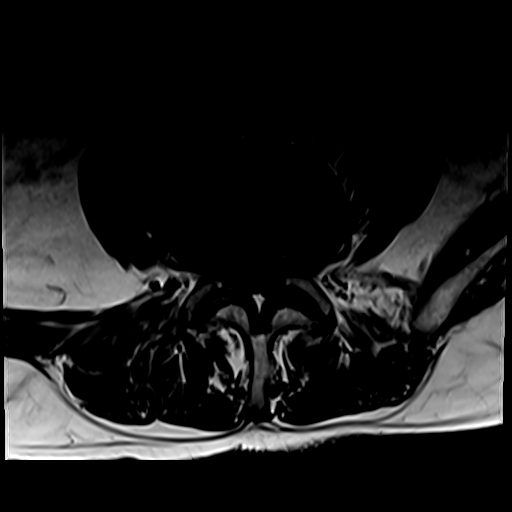
[im 12/26]
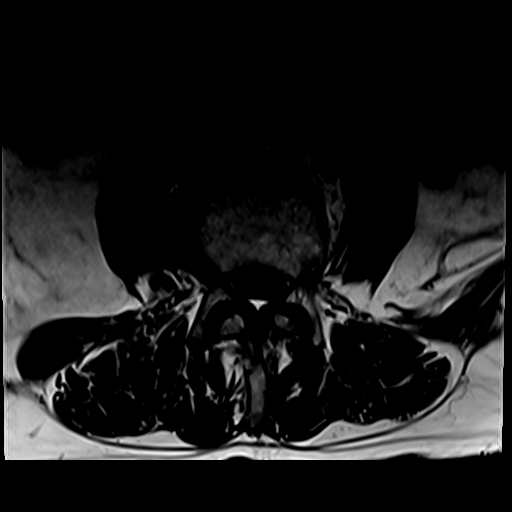
[im 14/26]
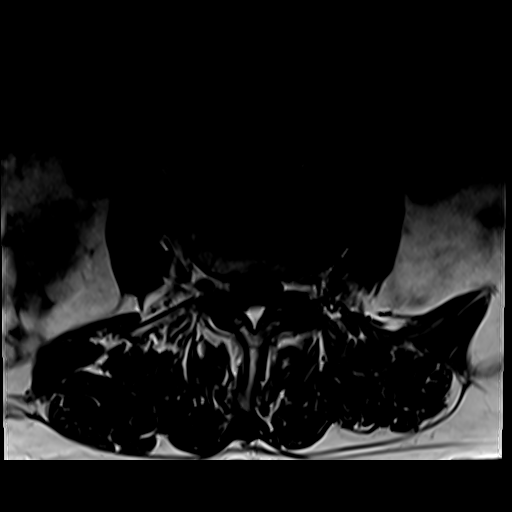
[im 18/26]
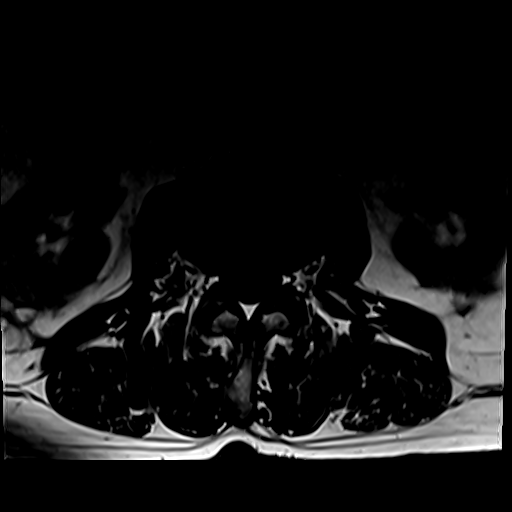
[im 22/26]
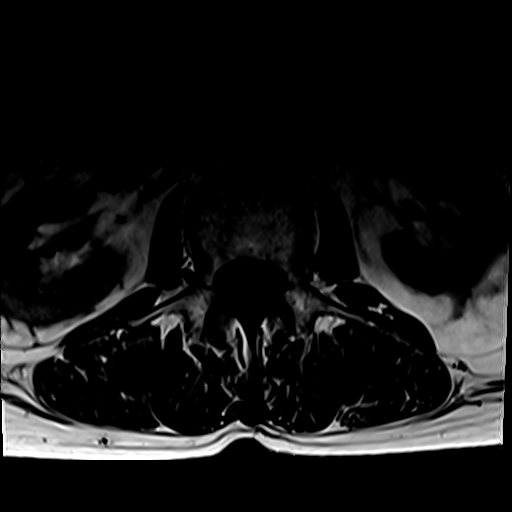
[im 26/26]
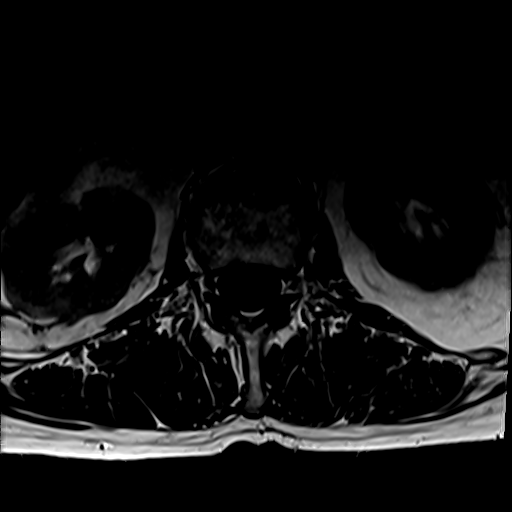

[30 of 48 positions shown; findings below may reference images not displayed]

FINDINGS: Segmentation:  Standard.

Alignment: Mild lumbar levoscoliosis. Trace retrolisthesis of L5 on
S1.

Vertebrae: No fracture or suspicious osseous lesion. Mild left-sided
degenerative endplate edema at L5-S1.

Conus medullaris and cauda equina: Conus extends to the L1 level.
Conus and cauda equina appear normal.

Paraspinal and other soft tissues: Infrarenal abdominal aortic
aneurysm measuring 3.0 cm in diameter.

Disc levels:

Disc desiccation throughout the lumbar spine. Moderate disc space
narrowing from L3-4 to L5-S1.

L1-2: Negative.

L2-3: Minimal disc bulging and mild facet hypertrophy without
stenosis.

L3-4: Circumferential disc bulging eccentric to the right, a right
subarticular and foraminal disc protrusion, prominent dorsal
epidural fat, and moderate facet and ligamentum flavum hypertrophy
result in severe spinal stenosis, right greater than left lateral
recess stenosis, and moderate right and mild left neural foraminal
stenosis. Potential right L4 nerve root impingement.

L4-5: Circumferential disc bulging, a small medial right foraminal
disc extrusion, and moderate facet and ligamentum flavum hypertrophy
result in moderate spinal stenosis, moderate to severe bilateral
lateral recess stenosis, and severe right and mild-to-moderate left
neural foraminal stenosis. Potential right L4 and bilateral L5 nerve
root impingement.

L5-S1: Circumferential disc bulging and mild-to-moderate facet
hypertrophy result in mild left lateral recess stenosis and
mild-to-moderate right and severe left neural foraminal stenosis
with potential left L5 nerve root impingement. No significant spinal
stenosis.
IMPRESSION: 1. Multilevel lumbar disc and facet degeneration.
2. Severe spinal stenosis and moderate right neural foraminal
stenosis at L3-4.
3. Moderate spinal stenosis and severe right neural foraminal
stenosis at L4-5.
4. Severe left neural foraminal stenosis at L5-S1.
5. 3.0 cm infrarenal abdominal aortic aneurysm. Recommend follow-up
ultrasound every 3 years. This recommendation follows ACR consensus
guidelines: White Paper of the ACR Incidental Findings Committee II

## 2022-05-10 DIAGNOSIS — R059 Cough, unspecified: Secondary | ICD-10-CM | POA: Diagnosis not present

## 2022-05-15 ENCOUNTER — Other Ambulatory Visit (HOSPITAL_COMMUNITY): Payer: Self-pay | Admitting: Nurse Practitioner

## 2022-05-15 DIAGNOSIS — I480 Paroxysmal atrial fibrillation: Secondary | ICD-10-CM

## 2022-05-30 DIAGNOSIS — M79602 Pain in left arm: Secondary | ICD-10-CM | POA: Diagnosis not present

## 2022-06-07 DIAGNOSIS — S5012XA Contusion of left forearm, initial encounter: Secondary | ICD-10-CM | POA: Diagnosis not present

## 2022-06-07 DIAGNOSIS — M79602 Pain in left arm: Secondary | ICD-10-CM | POA: Diagnosis not present

## 2022-06-23 IMAGING — CR DG LUMBAR SPINE 2-3V
2 series · 2 of 2 positions shown · non-contrast
Comparison: Lumbar radiographs 05/20/2020.  Lumbar MRI 06/09/2020.

CLINICAL DATA: 74-year-old male undergoing lumbar surgery.

EXAM:
LUMBAR SPINE - 2-3 VIEW

[lateral (1 of 2)]
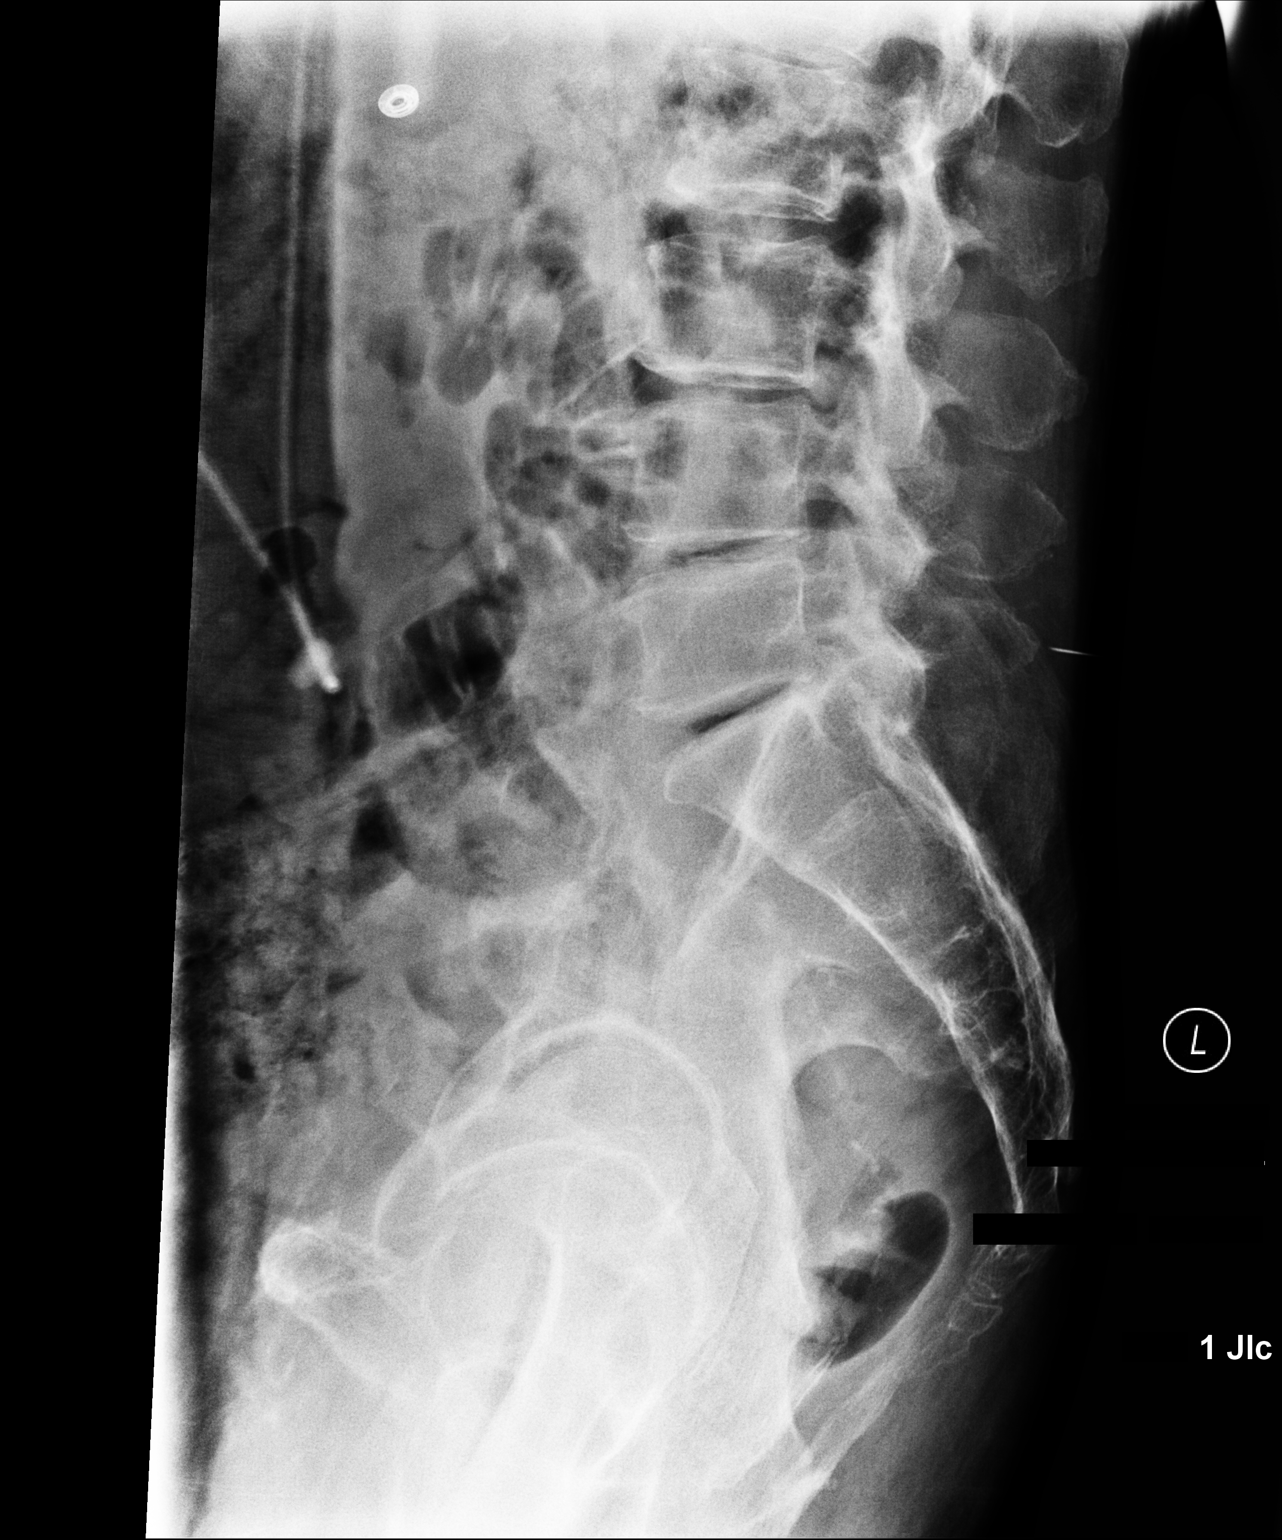

[lateral (2 of 2)]
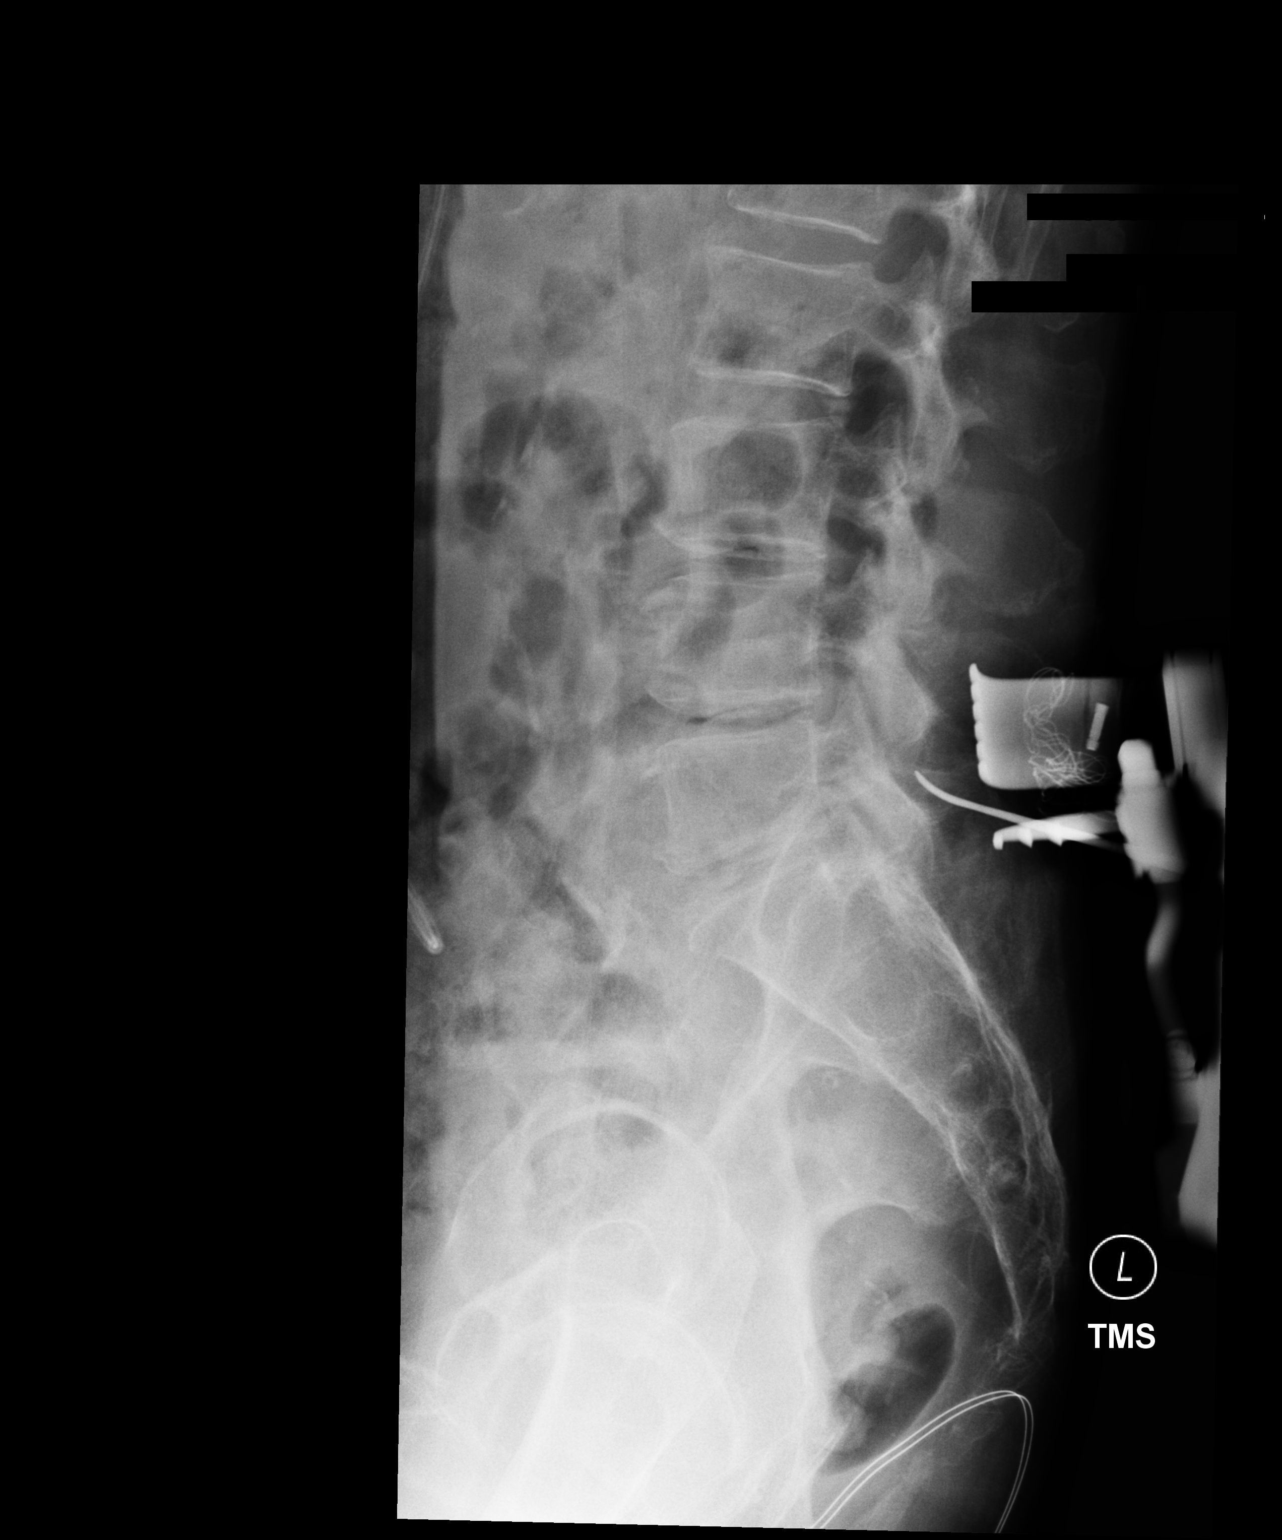

[2 of 2 positions shown; findings below may reference images not displayed]

FINDINGS: Normal lumbar segmentation on the Miller radiographs which is the
same numbering system used in [REDACTED].

Intraoperative portable cross-table lateral views of the lumbar
spine.

Image at 9177 hours. Posterior needle tip projects near the
posterior margin of the L5 spinous process.

Image #2 at 2621 hours. Surgical probe has been advanced to the L5
vertebral level, at the level of the L5 pedicle.
IMPRESSION: Intraoperative localization at L5.

## 2022-07-11 DIAGNOSIS — U071 COVID-19: Secondary | ICD-10-CM | POA: Diagnosis not present

## 2022-07-19 DIAGNOSIS — S5012XA Contusion of left forearm, initial encounter: Secondary | ICD-10-CM | POA: Diagnosis not present

## 2022-07-19 DIAGNOSIS — M79602 Pain in left arm: Secondary | ICD-10-CM | POA: Diagnosis not present

## 2022-07-26 DIAGNOSIS — E785 Hyperlipidemia, unspecified: Secondary | ICD-10-CM | POA: Diagnosis not present

## 2022-07-26 DIAGNOSIS — Z125 Encounter for screening for malignant neoplasm of prostate: Secondary | ICD-10-CM | POA: Diagnosis not present

## 2022-07-26 DIAGNOSIS — R7303 Prediabetes: Secondary | ICD-10-CM | POA: Diagnosis not present

## 2022-08-01 DIAGNOSIS — R7303 Prediabetes: Secondary | ICD-10-CM | POA: Diagnosis not present

## 2022-08-01 DIAGNOSIS — R800 Isolated proteinuria: Secondary | ICD-10-CM | POA: Diagnosis not present

## 2022-08-01 DIAGNOSIS — Z0000 Encounter for general adult medical examination without abnormal findings: Secondary | ICD-10-CM | POA: Diagnosis not present

## 2022-08-01 DIAGNOSIS — E782 Mixed hyperlipidemia: Secondary | ICD-10-CM | POA: Diagnosis not present

## 2022-08-01 DIAGNOSIS — M25551 Pain in right hip: Secondary | ICD-10-CM | POA: Diagnosis not present

## 2022-08-01 DIAGNOSIS — E875 Hyperkalemia: Secondary | ICD-10-CM | POA: Diagnosis not present

## 2022-08-01 DIAGNOSIS — I25810 Atherosclerosis of coronary artery bypass graft(s) without angina pectoris: Secondary | ICD-10-CM | POA: Diagnosis not present

## 2022-08-01 DIAGNOSIS — F411 Generalized anxiety disorder: Secondary | ICD-10-CM | POA: Diagnosis not present

## 2022-08-01 DIAGNOSIS — I10 Essential (primary) hypertension: Secondary | ICD-10-CM | POA: Diagnosis not present

## 2022-08-01 DIAGNOSIS — Z125 Encounter for screening for malignant neoplasm of prostate: Secondary | ICD-10-CM | POA: Diagnosis not present

## 2022-08-01 DIAGNOSIS — I482 Chronic atrial fibrillation, unspecified: Secondary | ICD-10-CM | POA: Diagnosis not present

## 2022-08-07 ENCOUNTER — Other Ambulatory Visit (HOSPITAL_COMMUNITY): Payer: Self-pay | Admitting: *Deleted

## 2022-08-07 MED ORDER — DOFETILIDE 500 MCG PO CAPS: ORAL_CAPSULE | ORAL | 1 refills | Status: DC

## 2022-08-24 ENCOUNTER — Ambulatory Visit (HOSPITAL_COMMUNITY)
Admission: RE | Admit: 2022-08-24 | Discharge: 2022-08-24 | Disposition: A | Discharge status: Home / Self Care | Payer: Medicare Other | Source: Ambulatory Visit | Attending: Nurse Practitioner | Admitting: Nurse Practitioner

## 2022-08-24 VITALS — BP 128/80 | HR 110 | Ht 65.0 in | Wt 141.6 lb

## 2022-08-24 DIAGNOSIS — Z87891 Personal history of nicotine dependence: Secondary | ICD-10-CM | POA: Diagnosis not present

## 2022-08-24 DIAGNOSIS — Z7901 Long term (current) use of anticoagulants: Secondary | ICD-10-CM | POA: Diagnosis not present

## 2022-08-24 DIAGNOSIS — D6869 Other thrombophilia: Secondary | ICD-10-CM

## 2022-08-24 DIAGNOSIS — Z79899 Other long term (current) drug therapy: Secondary | ICD-10-CM | POA: Diagnosis not present

## 2022-08-24 DIAGNOSIS — I10 Essential (primary) hypertension: Secondary | ICD-10-CM | POA: Insufficient documentation

## 2022-08-24 DIAGNOSIS — Z8616 Personal history of COVID-19: Secondary | ICD-10-CM | POA: Diagnosis not present

## 2022-08-24 DIAGNOSIS — I480 Paroxysmal atrial fibrillation: Secondary | ICD-10-CM | POA: Diagnosis not present

## 2022-08-24 DIAGNOSIS — Z8249 Family history of ischemic heart disease and other diseases of the circulatory system: Secondary | ICD-10-CM | POA: Diagnosis not present

## 2022-08-24 LAB — CBC
HCT: 44.6 % (ref 39.0–52.0)
Hemoglobin: 13.7 g/dL (ref 13.0–17.0)
MCH: 30.7 pg (ref 26.0–34.0)
MCHC: 30.7 g/dL (ref 30.0–36.0)
MCV: 100 fL (ref 80.0–100.0)
Platelets: 238 10*3/uL (ref 150–400)
RBC: 4.46 MIL/uL (ref 4.22–5.81)
RDW: 14.5 % (ref 11.5–15.5)
WBC: 7.7 10*3/uL (ref 4.0–10.5)
nRBC: 0 % (ref 0.0–0.2)

## 2022-08-24 LAB — BASIC METABOLIC PANEL
Anion gap: 8 (ref 5–15)
BUN: 17 mg/dL (ref 8–23)
CO2: 25 mmol/L (ref 22–32)
Calcium: 9.2 mg/dL (ref 8.9–10.3)
Chloride: 108 mmol/L (ref 98–111)
Creatinine, Ser: 1.21 mg/dL (ref 0.61–1.24)
GFR, Estimated: >60 mL/min (ref >60)
Glucose, Bld: 112 mg/dL — ABNORMAL HIGH (ref 70–99)
Potassium: 4 mmol/L (ref 3.5–5.1)
Sodium: 141 mmol/L (ref 135–145)

## 2022-08-24 LAB — MAGNESIUM: Magnesium: 2.2 mg/dL (ref 1.7–2.4)

## 2022-08-24 MED ORDER — METOPROLOL TARTRATE 25 MG PO TABS: 25.0000 mg | ORAL_TABLET | Freq: Two times a day (BID) | ORAL | 6 refills | Status: DC

## 2022-08-24 MED ORDER — AMLODIPINE BESYLATE 5 MG PO TABS: ORAL_TABLET | ORAL | Status: DC

## 2022-08-24 NOTE — Patient Instructions (Signed)
Start Metoprolol '25mg'$  twice daily

## 2022-08-24 NOTE — Progress Notes (Signed)
Patient ID: [name], male   DOB: [birth year], 76 y.o.   MRN: [id number]     Primary Care Physician: Celene Squibb, MD Referring Physician:Dr. Devontae Casasola is a 76 y.o. male with a h/o PAF that underwent tikosyn loading at [facility] 01/10/16 . He has held in [place] very nicely . His grandson dies last week and after the service did noticed some fast irregular heart besat but only lasted around one hour.   No change form his usual health. 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 Tries to stay hydrated. Has gotten both covid shots.   BP elevated today but at home systolic is around 462-703 systolic. Just took meds about an hour ago. Heart rate is in the upper 40's today. Not symptomatic with this. He is not on AV nodal drugs. He sees HR 's in the 60's at home.    F/u in afib clinic, 09/10/19. He is s/p decompressive laminectomy  12/13. He is recovering nicely. He is staying in [place]. Qt stable. He is continuing on Tikosyn 500 mcg bid.   F/u in afib clinic, 03/08/21 for Tikosyn surveillance. He reports that he is staying in rhythm. Continues on Tikosyn. No issues with xarelto 20 mg daily for a CHA2DS2VASc  of 4. He had back surgery in December with relief of pain and had covid in February 2022 and did not require hospitalization. Mostly had cough. No pneumonia by CXR.   F/u afib clinic, 09/21/21 for tikosyn surveillance. He contiues in SR. No afib to report. Compliant with tikosyn. He is still working full time as an Clinical biochemist. Stays very busy.   F/u in afib clinic, 02/23/22. He is in for tikosyn surveillance and has no afib to report. He has developed some nerve pain of rt hip which has been bothersome for him. Still working as hard as ever.   F/u in afib clinic, 08/24/22. He is in afib today. He had Covid in November and took around 3 days of paxlovid( which is contraindicated in covid).He does not know when he went into afib but noted an elevated HR last night.  He did take an 30 mg Cardizem last night. He continues on xarelto 20 mg daily. He feels his rhythm has not been right for around 2 weeks. 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 He states he has never had such  a busy fall season in his life. He is working long hours. He is  trying to move his son here to help.    Today, he denies symptoms of palpitations, chest pain, shortness of breath, orthopnea, PND, lower extremity edema, dizziness, presyncope, syncope, or neurologic sequela. The patient is tolerating medications without difficulties and is otherwise without complaint today.   Past Medical History:  Diagnosis Date   AAA (abdominal aortic aneurysm) ([place])    06/27/20 MRI: 3.0 cm infrarenal AAA, recommend 3 year follow-up   Anxiety    Atrial fibrillation ([place])    Bilateral carotid artery disease ([place]) 07/21/2013   CAD (coronary artery disease) 2008   CABG   COPD (chronic obstructive pulmonary disease) ([place])    Dysrhythmia    A-fib   Gastritis    Headache(784.0)    Hypertension    Visit for monitoring Tikosyn therapy 12/2015   Past Surgical History:  Procedure Laterality Date   CARDIOVERSION N/A 09/08/2015   Procedure: CARDIOVERSION;  Surgeon: Arnette Norris  Deboraha Sprang, MD;  Location: [place];  Service: Cardiovascular;  Laterality: N/A;   CORONARY ARTERY BYPASS GRAFT     EXCISION OF KELOID Left 10/08/2018   Procedure: EXCISION OF CHRONIC ABDOMINAL WALL WOUND;  Surgeon: Coralie Keens, MD;  Location: [place];  Service: General;  Laterality: Left;   INCISION AND DRAINAGE DEEP NECK ABSCESS     LUMBAR LAMINECTOMY/DECOMPRESSION MICRODISCECTOMY Right 08/09/2020   Procedure: Laminectomy and Foraminotomy - Lumbar three-Lumbar four - Lumbar four-Lumbar five- right;  Surgeon: Kary Kos, MD;  Location: [place];  Service: Neurosurgery;  Laterality: Right;   ROTATOR CUFF REPAIR Bilateral    TEE WITHOUT CARDIOVERSION N/A  08/02/2015   Procedure: TRANSESOPHAGEAL ECHOCARDIOGRAM (TEE);  Surgeon: Sueanne Margarita, MD;  Location[facility] ENDOSCOPY;  Service: Cardiovascular;  Laterality: N/A;   TEE WITHOUT CARDIOVERSION N/A 09/08/2015   Procedure: TRANSESOPHAGEAL ECHOCARDIOGRAM (TEE);  Surgeon: Thayer Headings, MD;  Location: [facility] ENDOSCOPY;  Service: Cardiovascular;  Laterality: N/A;    Current Outpatient Medications  Medication Sig Dispense Refill   ALPRAZolam (XANAX) 0.25 MG tablet TAKE 1 TABLET(0.25 MG) BY MOUTH DAILY 10 tablet 0   amLODipine (NORVASC) 5 MG tablet TAKE 1 TABLET(5 MG) BY MOUTH DAILY 30 tablet 11   atorvastatin (LIPITOR) 40 MG tablet TAKE 1 TABLET(40 MG) BY MOUTH DAILY 90 tablet 1   diltiazem (CARDIZEM) 30 MG tablet Take 1 tablet every 4 hours AS NEEDED for heart rate >100 as long as blood pressure >100. 45 tablet 1   dofetilide (TIKOSYN) 500 MCG capsule TAKE 1 CAPSULE(500 MCG) BY MOUTH TWICE DAILY 180 capsule 1   gabapentin (NEURONTIN) 300 MG capsule Take 1 capsule (300 mg total) by mouth at bedtime. 30 capsule 1   lisinopril (ZESTRIL) 20 MG tablet TAKE 1 TABLET(20 MG) BY MOUTH DAILY 90 tablet 1   Multiple Vitamins-Minerals (MULTIVITAMIN WITH MINERALS) tablet Take 1 tablet by mouth at bedtime.      Omega-3 Fatty Acids (FISH OIL) 1000 MG CPDR Take 1,000 mg by mouth at bedtime.      potassium chloride (KLOR-CON) 10 MEQ tablet TAKE 1 TABLET(10 MEQ) BY MOUTH DAILY 90 tablet 3   vitamin C (ASCORBIC ACID) 500 MG tablet Take 500 mg by mouth at bedtime.      XARELTO 20 MG TABS tablet TAKE 1 TABLET(20 MG) BY MOUTH DAILY WITH SUPPER 30 tablet 11   No current facility-administered medications for this encounter.    Allergies  Allergen Reactions   Doxycycline Shortness Of Breath    Also has to urinate more often   Nsaids Hypertension    Gastritis     Social History   Socioeconomic History   Marital status: Married    Spouse name: Not on file   Number of children: Not on file   Years of education: Not on  file   Highest education level: Not on file  Occupational History   Not on file  Tobacco Use   Smoking status: Former   Smokeless tobacco: Former    Types: Nurse, children's Use: Never used  Substance and Sexual Activity   Alcohol use: No    Comment: quit drinking in the 70's   Drug use: No   Sexual activity: Not on file  Other Topics Concern   Not on file  Social History Narrative   Not on file   Social Determinants of Health   Financial Resource Strain: Not on file  Food Insecurity: No Food Insecurity (04/21/2022)  Hunger Vital Sign    Worried About Running Out of Food in the Last Year: Never true    Ran Out of Food in the Last Year: Never true  Transportation Needs: No Transportation Needs (04/21/2022)   PRAPARE - Hydrologist (Medical): No    Lack of Transportation (Non-Medical): No  Physical Activity: Not on file  Stress: Not on file  Social Connections: Not on file  Intimate Partner Violence: Not on file    Family History  Problem Relation Age of Onset   Diabetes Mother    Hypertension Mother    Heart attack Father    Sudden death Father    Hypertension Sister    Diabetes Sister     ROS- All systems are reviewed and negative except as per the HPI above  Physical Exam: Vitals:   08/24/22 1043  Weight: 64.2 kg  Height: '5\' 5"'$  (1.651 m)    GEN- The patient is well appearing, alert and oriented x 3 today.   Head- normocephalic, atraumatic Eyes-  Sclera clear, conjunctiva pink Ears- hearing intact Oropharynx- clear Neck- supple, no JVP Lymph- no cervical lymphadenopathy Lungs- Clear to ausculation bilaterally, normal work of breathing Heart- Regular rate and rhythm, no murmurs, rubs or gallops, PMI not laterally displaced GI- soft, NT, ND, + BS Extremities- no clubbing, cyanosis, or edema MS- no significant deformity or atrophy Skin- no rash or lesion Psych- euthymic mood, full affect Neuro- strength and  sensation are intact  EKG -Vent. rate 110 BPM PR interval * ms QRS duration 82 ms QT/QTcB 358/484 ms P-R-T axes * 20 98  Critical Test Result: Arrhythmia Undetermined rhythm Septal infarct , age undetermined Abnormal ECG When compared with ECG of 23-Feb-2022 10:58, PREVIOUS ECG IS PRESENT\\it   Appears to be afib with PVC's with RVR  Epic records reviewed  Assessment and Plan: 1. Paroxysmal atrial fibrillation Has been staying in SR until recently Covid in November and stress of working long hours may have contributed Continue tikosyn 500 mg bid Add metoprolol tartrate 25 mg bid  Continue xarelto 20 mg daily with CHA2DS2VASc score of 2  Bmet/mag/cbc today     2. H/o Carotid artery disease  Per Dr. Marlou Porch  3. HTN Stable   F/u Tuesday in [place] clinic, if has not converted, will plan on cardioversion     Butch Penny C. Artis Beggs, [facility] [address] [place], [place] [address] [phone]

## 2022-08-24 NOTE — Addendum Note (Signed)
Encounter addended by: Enid Derry, CMA on: 08/24/2022 1:47 PM  Actions taken: Order list changed

## 2022-08-29 ENCOUNTER — Ambulatory Visit (HOSPITAL_COMMUNITY)
Admission: RE | Admit: 2022-08-29 | Discharge: 2022-08-29 | Disposition: A | Discharge status: Home / Self Care | Payer: Medicare Other | Source: Ambulatory Visit | Attending: Nurse Practitioner | Admitting: Nurse Practitioner

## 2022-08-29 DIAGNOSIS — I480 Paroxysmal atrial fibrillation: Secondary | ICD-10-CM | POA: Diagnosis not present

## 2022-08-29 NOTE — Progress Notes (Signed)
Pt in for EKG with the addition of metoprolol 25 mg bid for breakthrough afib with RVR. See my last office note from 12/28. He has now converted. Continue Tikosyn 500 mcg bid. BP improved and feels well. Qtc stable. He has f/u  with Dr. Marlou Porch  in 2 weeks and it can be decided if BB needs to be continued at that point. Cbc/bmet/mag were stable on recheck 08/24/22.  Vent. rate 51 BPM PR interval 166 ms QRS duration 82 ms QT/QTcB 466/429 ms P-R-T axes 87 39 31 Sinus bradycardia Possible Anteroseptal infarct , age undetermined Abnormal ECG When compared with ECG of 24-Aug-2022 11:03, PREVIOUS ECG IS PRESENT  Geroge Baseman. Haaris Metallo, [facility] [address] [place], [place] [address] [phone]

## 2022-09-06 ENCOUNTER — Ambulatory Visit (HOSPITAL_COMMUNITY)
Admission: RE | Admit: 2022-09-06 | Discharge: 2022-09-06 | Disposition: A | Discharge status: Home / Self Care | Payer: Medicare Other | Source: Ambulatory Visit | Attending: Cardiology | Admitting: Cardiology

## 2022-09-06 DIAGNOSIS — I6523 Occlusion and stenosis of bilateral carotid arteries: Secondary | ICD-10-CM | POA: Insufficient documentation

## 2022-09-15 ENCOUNTER — Ambulatory Visit: Payer: Medicare Other | Admitting: Cardiology

## 2022-10-03 ENCOUNTER — Encounter: Payer: Self-pay | Admitting: Cardiology

## 2022-10-03 ENCOUNTER — Ambulatory Visit: Payer: Medicare Other | Attending: Cardiology | Admitting: Cardiology

## 2022-10-03 VITALS — BP 124/78 | HR 93 | Ht 65.0 in | Wt 136.2 lb

## 2022-10-03 DIAGNOSIS — I480 Paroxysmal atrial fibrillation: Secondary | ICD-10-CM | POA: Insufficient documentation

## 2022-10-03 DIAGNOSIS — I25708 Atherosclerosis of coronary artery bypass graft(s), unspecified, with other forms of angina pectoris: Secondary | ICD-10-CM

## 2022-10-03 DIAGNOSIS — I6523 Occlusion and stenosis of bilateral carotid arteries: Secondary | ICD-10-CM | POA: Insufficient documentation

## 2022-10-03 NOTE — Progress Notes (Signed)
Cardiology Office Note:    Date:  10/03/2022   ID:  Ian, Moyer Mar 08, 1946, MRN [id number]  PCP:  Ian Squibb, MD   [facility] Providers Cardiologist:  Ian Furbish, MD     Referring MD: Ian Squibb, MD    History of Present Illness:    [name] is a 77 y.o. male here for follow-up of paroxysmal atrial fibrillation, prior CABG, carotid dz.  Has been on Tikosyn 500 mcg twice daily.  Sees [name] in the A. fib clinic as well.  Reviewed previous visit was with her.  Had spinal surgery in December 2021.  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    He cut his hand.  Saw [name].  He evacuated a small hematoma.  Late 2023 had COVID and AFIB returned.  Repeat EKG showed sinus bradycardia.  He was started on metoprolol 25 twice a day by [name].  He occasionally will take his added diltiazem.  Overall doing quite well.  Lives in [place].  He knew [name]'s father.  Past Medical History:  Diagnosis Date   AAA (abdominal aortic aneurysm) ([place])    06/27/20 MRI: 3.0 cm infrarenal AAA, recommend 3 year follow-up   Anxiety    Atrial fibrillation ([place])    Bilateral carotid artery disease ([place]) 07/21/2013   CAD (coronary artery disease) 2008   CABG   COPD (chronic obstructive pulmonary disease) ([facility])    Dysrhythmia    A-fib   Gastritis    Headache(784.0)    Hypertension    Visit for monitoring Tikosyn therapy 12/2015    Past Surgical History:  Procedure Laterality Date   CARDIOVERSION N/A 09/08/2015   Procedure: CARDIOVERSION;  Surgeon: Thayer Headings, MD;  Location: [facility];  Service: Cardiovascular;  Laterality: N/A;   CORONARY ARTERY BYPASS GRAFT     EXCISION OF KELOID Left 10/08/2018   Procedure: EXCISION OF CHRONIC ABDOMINAL WALL WOUND;  Surgeon: Coralie Keens, MD;  Location: [facility];  Service: General;  Laterality: Left;   INCISION AND DRAINAGE DEEP NECK ABSCESS     LUMBAR  LAMINECTOMY/DECOMPRESSION MICRODISCECTOMY Right 08/09/2020   Procedure: Laminectomy and Foraminotomy - Lumbar three-Lumbar four - Lumbar four-Lumbar five- right;  Surgeon: Kary Kos, MD;  Location: [place];  Service: Neurosurgery;  Laterality: Right;   ROTATOR CUFF REPAIR Bilateral    TEE WITHOUT CARDIOVERSION N/A 08/02/2015   Procedure: TRANSESOPHAGEAL ECHOCARDIOGRAM (TEE);  Surgeon: Sueanne Margarita, MD;  Location: [facility] ENDOSCOPY;  Service: Cardiovascular;  Laterality: N/A;   TEE WITHOUT CARDIOVERSION N/A 09/08/2015   Procedure: TRANSESOPHAGEAL ECHOCARDIOGRAM (TEE);  Surgeon: Thayer Headings, MD;  Location: [facility] ENDOSCOPY;  Service: Cardiovascular;  Laterality: N/A;    Current Medications: Current Meds  Medication Sig   ALPRAZolam (XANAX) 0.25 MG tablet TAKE 1 TABLET(0.25 MG) BY MOUTH DAILY   amLODipine (NORVASC) 5 MG tablet Take 1/2 tablet by mouth daily   atorvastatin (LIPITOR) 40 MG tablet TAKE 1 TABLET(40 MG) BY MOUTH DAILY   diltiazem (CARDIZEM) 30 MG tablet Take 1 tablet every 4 hours AS NEEDED for heart rate >100 as long as blood pressure >100.   dofetilide (TIKOSYN) 500 MCG capsule TAKE 1 CAPSULE(500 MCG) BY MOUTH TWICE DAILY   gabapentin (NEURONTIN) 300 MG capsule Take 1 capsule (300 mg total) by mouth at bedtime.   lisinopril (ZESTRIL) 20 MG tablet TAKE 1 TABLET(20 MG) BY MOUTH DAILY   metoprolol tartrate (LOPRESSOR) 25 MG  tablet Take 1 tablet (25 mg total) by mouth 2 (two) times daily.   Multiple Vitamins-Minerals (MULTIVITAMIN WITH MINERALS) tablet Take 1 tablet by mouth at bedtime.    Omega-3 Fatty Acids (FISH OIL) 1000 MG CPDR Take 1,000 mg by mouth at bedtime.    potassium chloride (KLOR-CON) 10 MEQ tablet TAKE 1 TABLET(10 MEQ) BY MOUTH DAILY   vitamin C (ASCORBIC ACID) 500 MG tablet Take 500 mg by mouth at bedtime.    XARELTO 20 MG TABS tablet TAKE 1 TABLET(20 MG) BY MOUTH DAILY WITH SUPPER     Allergies:   Doxycycline and Nsaids   Social History   Socioeconomic History    Marital status: Married    Spouse name: Not on file   Number of children: Not on file   Years of education: Not on file   Highest education level: Not on file  Occupational History   Not on file  Tobacco Use   Smoking status: Former   Smokeless tobacco: Former    Types: Nurse, children's Use: Never used  Substance and Sexual Activity   Alcohol use: No    Comment: quit drinking in the 70's   Drug use: No   Sexual activity: Not on file  Other Topics Concern   Not on file  Social History Narrative   Not on file   Social Determinants of Health   Financial Resource Strain: Not on file  Food Insecurity: No Food Insecurity (04/21/2022)   Hunger Vital Sign    Worried About Running Out of Food in the Last Year: Never true    Ran Out of Food in the Last Year: Never true  Transportation Needs: No Transportation Needs (04/21/2022)   PRAPARE - Hydrologist (Medical): No    Lack of Transportation (Non-Medical): No  Physical Activity: Not on file  Stress: Not on file  Social Connections: Not on file     Family History: The patient's family history includes Diabetes in his mother and sister; Heart attack in his father; Hypertension in his mother and sister; Sudden death in his father.  ROS:   Please see the history of present illness.     All other systems reviewed and are negative.  EKGs/Labs/Other Studies Reviewed:    The following studies were reviewed today: Prior EKG, echocardiogram, office notes reviewed  EKG: No new today  Recent Labs: 08/24/2022: BUN 17; Creatinine, Ser 1.21; Hemoglobin 13.7; Magnesium 2.2; Platelets 238; Potassium 4.0; Sodium 141  Recent Lipid Panel    Component Value Date/Time   CHOL 141 01/12/2020 0838   TRIG 71 01/12/2020 0838   HDL 57 01/12/2020 0838   CHOLHDL 2.5 01/12/2020 0838   CHOLHDL 2.6 08/03/2014 0822   VLDL 10 08/03/2014 0822   LDLCALC 70 01/12/2020 0838     Risk Assessment/Calculations:               Physical Exam:    VS:  BP 124/78   Pulse 93   Ht '5\' 5"'$  (1.651 m)   Wt 136 lb 3.2 oz (61.8 kg)   SpO2 95%   BMI 22.66 kg/m     Wt Readings from Last 3 Encounters:  10/03/22 136 lb 3.2 oz (61.8 kg)  08/24/22 141 lb 9.6 oz (64.2 kg)  02/23/22 141 lb 3.2 oz (64 kg)     GEN:  Well nourished, well developed in no acute distress HEENT: Normal NECK: No JVD; No carotid bruits LYMPHATICS: No  lymphadenopathy CARDIAC: RRR, no murmurs, no rubs, gallops CABG scar noted RESPIRATORY:  Clear to auscultation without rales, wheezing or rhonchi  ABDOMEN: Soft, non-tender, non-distended MUSCULOSKELETAL:  No edema; No deformity  SKIN: Warm and dry NEUROLOGIC:  Alert and oriented x 3 PSYCHIATRIC:  Normal affect  No change in PE  ASSESSMENT:    1. Paroxysmal atrial fibrillation (HCC)   2. Coronary artery disease involving coronary bypass graft of native heart with other forms of angina pectoris ([place])   3. Bilateral carotid artery stenosis     PLAN:    In order of problems listed above:   Paroxysmal atrial fibrillation (HCC) Continue with Tikosyn 500 mcg twice a day.  QT has been reassuring.  Back on metoprolol 25 BID.   Has diltiazem 30 mg if needed  Bilateral carotid artery disease 09/06/22 :Right carotid 60-79% Left carotid 1-39% Moderate disease noted.  Continue with goal-directed medical therapy.  On atorvastatin 40 mg a day.  Less than 70.  At last check 65.  Excellent.  No myalgias.  CAD (coronary artery disease) of artery bypass graft Prior bypass surgery in 2008.  No anginal symptoms currently.  Doing well.  He is not on aspirin because he is on Xarelto.  Essential hypertension, benign Blood pressure under reasonable control  Hypercoagulable state, secondary ([place]) Continue with Xarelto.  Continue with lab work monitoring, prior creatinine 1.2, hemoglobin 13.7.  AAA (abdominal aortic aneurysm) without rupture 3 cm in October 2021.  We will repeat in future           Medication Adjustments/Labs and Tests Ordered: Current medicines are reviewed at length with the patient today.  Concerns regarding medicines are outlined above.  No orders of the defined types were placed in this encounter.  No orders of the defined types were placed in this encounter.   Patient Instructions  Medication Instructions:  The current medical regimen is effective;  continue present plan and medications.  *If you need a refill on your cardiac medications before your next appointment, please call your pharmacy*  Follow-Up: At [facility], you and your health needs are our priority.  As part of our continuing mission to provide you with exceptional heart care, we have created designated Provider Care Teams.  These Care Teams include your primary Cardiologist (physician) and Advanced Practice Providers (APPs -  Physician Assistants and Nurse Practitioners) who all work together to provide you with the care you need, when you need it.  We recommend signing up for the patient portal called "MyChart".  Sign up information is provided on this After Visit Summary.  MyChart is used to connect with patients for Virtual Visits (Telemedicine).  Patients are able to view lab/test results, encounter notes, upcoming appointments, etc.  Non-urgent messages can be sent to your provider as well.   To learn more about what you can do with MyChart, go to [email or web address].    Your next appointment:   1 year(s)  Provider:   Candee Furbish, MD        Signed, Ian Furbish, MD  10/03/2022 8:43 AM    [place]

## 2022-10-03 NOTE — Patient Instructions (Signed)
Medication Instructions:  The current medical regimen is effective;  continue present plan and medications.  *If you need a refill on your cardiac medications before your next appointment, please call your pharmacy*  Follow-Up: At [facility], you and your health needs are our priority.  As part of our continuing mission to provide you with exceptional heart care, we have created designated Provider Care Teams.  These Care Teams include your primary Cardiologist (physician) and Advanced Practice Providers (APPs -  Physician Assistants and Nurse Practitioners) who all work together to provide you with the care you need, when you need it.  We recommend signing up for the patient portal called "MyChart".  Sign up information is provided on this After Visit Summary.  MyChart is used to connect with patients for Virtual Visits (Telemedicine).  Patients are able to view lab/test results, encounter notes, upcoming appointments, etc.  Non-urgent messages can be sent to your provider as well.   To learn more about what you can do with MyChart, go to [email or web address].    Your next appointment:   1 year(s)  Provider:   Mark Skains, MD      

## 2022-10-03 NOTE — Addendum Note (Signed)
Addended by: Shellia Cleverly on: 10/03/2022 09:10 AM   Modules accepted: Orders

## 2022-10-05 ENCOUNTER — Encounter (HOSPITAL_COMMUNITY): Payer: Self-pay | Admitting: *Deleted

## 2022-10-16 ENCOUNTER — Encounter (HOSPITAL_COMMUNITY): Payer: Self-pay | Admitting: Cardiology

## 2022-10-16 DIAGNOSIS — I6523 Occlusion and stenosis of bilateral carotid arteries: Secondary | ICD-10-CM

## 2022-10-25 ENCOUNTER — Other Ambulatory Visit (HOSPITAL_COMMUNITY): Payer: Self-pay | Admitting: *Deleted

## 2022-10-25 MED ORDER — DILTIAZEM HCL 30 MG PO TABS: ORAL_TABLET | ORAL | 1 refills | Status: DC

## 2022-11-06 DIAGNOSIS — Z87891 Personal history of nicotine dependence: Secondary | ICD-10-CM | POA: Diagnosis not present

## 2022-11-06 DIAGNOSIS — J019 Acute sinusitis, unspecified: Secondary | ICD-10-CM | POA: Diagnosis not present

## 2022-11-09 ENCOUNTER — Other Ambulatory Visit (HOSPITAL_COMMUNITY): Payer: Self-pay | Admitting: *Deleted

## 2022-11-09 MED ORDER — AMLODIPINE BESYLATE 5 MG PO TABS: ORAL_TABLET | ORAL | 3 refills | Status: DC

## 2022-11-22 ENCOUNTER — Other Ambulatory Visit (HOSPITAL_COMMUNITY): Payer: Self-pay | Admitting: *Deleted

## 2022-11-22 MED ORDER — POTASSIUM CHLORIDE ER 10 MEQ PO TBCR: EXTENDED_RELEASE_TABLET | ORAL | 2 refills | Status: DC

## 2022-12-07 ENCOUNTER — Ambulatory Visit: Payer: Medicare Other | Admitting: Cardiology

## 2023-01-02 ENCOUNTER — Other Ambulatory Visit (HOSPITAL_COMMUNITY): Payer: Self-pay | Admitting: *Deleted

## 2023-01-02 MED ORDER — DOFETILIDE 500 MCG PO CAPS: ORAL_CAPSULE | ORAL | 1 refills | Status: DC

## 2023-01-30 DIAGNOSIS — R7303 Prediabetes: Secondary | ICD-10-CM | POA: Diagnosis not present

## 2023-01-30 DIAGNOSIS — Z125 Encounter for screening for malignant neoplasm of prostate: Secondary | ICD-10-CM | POA: Diagnosis not present

## 2023-01-31 DIAGNOSIS — Z881 Allergy status to other antibiotic agents status: Secondary | ICD-10-CM | POA: Diagnosis not present

## 2023-01-31 DIAGNOSIS — I10 Essential (primary) hypertension: Secondary | ICD-10-CM | POA: Diagnosis not present

## 2023-01-31 DIAGNOSIS — Z87891 Personal history of nicotine dependence: Secondary | ICD-10-CM | POA: Diagnosis not present

## 2023-01-31 DIAGNOSIS — Z886 Allergy status to analgesic agent status: Secondary | ICD-10-CM | POA: Diagnosis not present

## 2023-01-31 DIAGNOSIS — S0093XA Contusion of unspecified part of head, initial encounter: Secondary | ICD-10-CM | POA: Diagnosis not present

## 2023-01-31 DIAGNOSIS — Z7901 Long term (current) use of anticoagulants: Secondary | ICD-10-CM | POA: Diagnosis not present

## 2023-01-31 DIAGNOSIS — R58 Hemorrhage, not elsewhere classified: Secondary | ICD-10-CM | POA: Diagnosis not present

## 2023-01-31 DIAGNOSIS — R519 Headache, unspecified: Secondary | ICD-10-CM | POA: Diagnosis not present

## 2023-01-31 DIAGNOSIS — I480 Paroxysmal atrial fibrillation: Secondary | ICD-10-CM | POA: Diagnosis not present

## 2023-01-31 DIAGNOSIS — J45909 Unspecified asthma, uncomplicated: Secondary | ICD-10-CM | POA: Diagnosis not present

## 2023-01-31 DIAGNOSIS — S0091XA Abrasion of unspecified part of head, initial encounter: Secondary | ICD-10-CM | POA: Diagnosis not present

## 2023-01-31 DIAGNOSIS — I160 Hypertensive urgency: Secondary | ICD-10-CM | POA: Diagnosis not present

## 2023-01-31 DIAGNOSIS — Z79899 Other long term (current) drug therapy: Secondary | ICD-10-CM | POA: Diagnosis not present

## 2023-01-31 DIAGNOSIS — S065XAA Traumatic subdural hemorrhage with loss of consciousness status unknown, initial encounter: Secondary | ICD-10-CM

## 2023-01-31 DIAGNOSIS — S065X0A Traumatic subdural hemorrhage without loss of consciousness, initial encounter: Secondary | ICD-10-CM | POA: Diagnosis not present

## 2023-01-31 DIAGNOSIS — Z23 Encounter for immunization: Secondary | ICD-10-CM | POA: Diagnosis not present

## 2023-01-31 DIAGNOSIS — I2510 Atherosclerotic heart disease of native coronary artery without angina pectoris: Secondary | ICD-10-CM | POA: Diagnosis not present

## 2023-01-31 DIAGNOSIS — S065X9A Traumatic subdural hemorrhage with loss of consciousness of unspecified duration, initial encounter: Secondary | ICD-10-CM | POA: Diagnosis not present

## 2023-01-31 DIAGNOSIS — W19XXXA Unspecified fall, initial encounter: Secondary | ICD-10-CM | POA: Diagnosis not present

## 2023-01-31 DIAGNOSIS — Z951 Presence of aortocoronary bypass graft: Secondary | ICD-10-CM | POA: Diagnosis not present

## 2023-01-31 HISTORY — DX: Traumatic subdural hemorrhage with loss of consciousness status unknown, initial encounter: S06.5XAA

## 2023-02-01 DIAGNOSIS — I081 Rheumatic disorders of both mitral and tricuspid valves: Secondary | ICD-10-CM | POA: Diagnosis not present

## 2023-02-01 DIAGNOSIS — S065XAA Traumatic subdural hemorrhage with loss of consciousness status unknown, initial encounter: Secondary | ICD-10-CM | POA: Diagnosis not present

## 2023-02-01 DIAGNOSIS — S065X9A Traumatic subdural hemorrhage with loss of consciousness of unspecified duration, initial encounter: Secondary | ICD-10-CM | POA: Diagnosis not present

## 2023-02-05 DIAGNOSIS — W19XXXD Unspecified fall, subsequent encounter: Secondary | ICD-10-CM | POA: Diagnosis not present

## 2023-02-05 DIAGNOSIS — R809 Proteinuria, unspecified: Secondary | ICD-10-CM | POA: Diagnosis not present

## 2023-02-05 DIAGNOSIS — E1169 Type 2 diabetes mellitus with other specified complication: Secondary | ICD-10-CM | POA: Diagnosis not present

## 2023-02-05 DIAGNOSIS — E782 Mixed hyperlipidemia: Secondary | ICD-10-CM | POA: Diagnosis not present

## 2023-02-05 DIAGNOSIS — R944 Abnormal results of kidney function studies: Secondary | ICD-10-CM | POA: Diagnosis not present

## 2023-02-05 DIAGNOSIS — I10 Essential (primary) hypertension: Secondary | ICD-10-CM | POA: Diagnosis not present

## 2023-02-05 DIAGNOSIS — I482 Chronic atrial fibrillation, unspecified: Secondary | ICD-10-CM | POA: Diagnosis not present

## 2023-02-05 DIAGNOSIS — F411 Generalized anxiety disorder: Secondary | ICD-10-CM | POA: Diagnosis not present

## 2023-02-05 DIAGNOSIS — S0990XD Unspecified injury of head, subsequent encounter: Secondary | ICD-10-CM | POA: Diagnosis not present

## 2023-02-05 DIAGNOSIS — M25551 Pain in right hip: Secondary | ICD-10-CM | POA: Diagnosis not present

## 2023-02-05 DIAGNOSIS — I25810 Atherosclerosis of coronary artery bypass graft(s) without angina pectoris: Secondary | ICD-10-CM | POA: Diagnosis not present

## 2023-02-05 DIAGNOSIS — Z713 Dietary counseling and surveillance: Secondary | ICD-10-CM | POA: Diagnosis not present

## 2023-02-09 ENCOUNTER — Ambulatory Visit (HOSPITAL_COMMUNITY)
Admission: RE | Admit: 2023-02-09 | Discharge: 2023-02-09 | Disposition: A | Discharge status: Home / Self Care | Payer: Medicare Other | Source: Ambulatory Visit | Attending: Physician Assistant | Admitting: Physician Assistant

## 2023-02-09 VITALS — BP 160/80 | HR 56 | Ht 65.0 in | Wt 141.6 lb

## 2023-02-09 DIAGNOSIS — D6869 Other thrombophilia: Secondary | ICD-10-CM | POA: Diagnosis not present

## 2023-02-09 DIAGNOSIS — I2510 Atherosclerotic heart disease of native coronary artery without angina pectoris: Secondary | ICD-10-CM | POA: Insufficient documentation

## 2023-02-09 DIAGNOSIS — Z951 Presence of aortocoronary bypass graft: Secondary | ICD-10-CM | POA: Insufficient documentation

## 2023-02-09 DIAGNOSIS — Z5181 Encounter for therapeutic drug level monitoring: Secondary | ICD-10-CM

## 2023-02-09 DIAGNOSIS — Z7901 Long term (current) use of anticoagulants: Secondary | ICD-10-CM | POA: Insufficient documentation

## 2023-02-09 DIAGNOSIS — I10 Essential (primary) hypertension: Secondary | ICD-10-CM | POA: Insufficient documentation

## 2023-02-09 DIAGNOSIS — I480 Paroxysmal atrial fibrillation: Secondary | ICD-10-CM | POA: Diagnosis not present

## 2023-02-09 DIAGNOSIS — Z79899 Other long term (current) drug therapy: Secondary | ICD-10-CM | POA: Insufficient documentation

## 2023-02-09 MED ORDER — AMLODIPINE BESYLATE 5 MG PO TABS: 5.0000 mg | ORAL_TABLET | Freq: Every day | ORAL | 2 refills | Status: DC

## 2023-02-09 MED ORDER — NITROGLYCERIN 0.4 MG SL SUBL: 0.4000 mg | SUBLINGUAL_TABLET | SUBLINGUAL | 3 refills | Status: DC | PRN

## 2023-02-09 NOTE — Progress Notes (Addendum)
Primary Care Physician: Benita Stabile, MD Referring Physician:Dr. Keaghan Kovalenko is a 77 y.o. male with a h/o PAF that underwent tikosyn loading at [facility] 01/10/16 . He has held in SR very nicely . His grandson dies last week and after the service did noticed some fast irregular heart besat but only lasted around one hour.   No change form his usual health. 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 Tries to stay hydrated. Has gotten both covid shots.   BP elevated today but at home systolic is around 130-140 systolic. Just took meds about an hour ago. Heart rate is in the upper 40's today. Not symptomatic with this. He is not on AV nodal drugs. He sees HR 's in the 60's at home.    F/u in afib clinic, 09/10/19. He is s/p decompressive laminectomy  12/13. He is recovering nicely. He is staying in SR. Qt stable. He is continuing on Tikosyn 500 mcg bid.   F/u in afib clinic, 03/08/21 for Tikosyn surveillance. He reports that he is staying in rhythm. Continues on Tikosyn. No issues with xarelto 20 mg daily for a CHA2DS2VASc  of 4. He had back surgery in December with relief of pain and had covid in February 2022 and did not require hospitalization. Mostly had cough. No pneumonia by CXR.   F/u afib clinic, 09/21/21 for tikosyn surveillance. He contiues in SR. No afib to report. Compliant with tikosyn. He is still working full time as an Personnel officer. Stays very busy.   F/u in afib clinic, 02/23/22. He is in for tikosyn surveillance and has no afib to report. He has developed some nerve pain of rt hip which has been bothersome for him. Still working as hard as ever.   F/u in afib clinic, 08/24/22. He is in afib today. He had Covid in November and took around 3 days of paxlovid( which is contraindicated in covid).He does not know when he went into afib but noted an elevated HR last night. He did take an 30 mg Cardizem last night. He continues on xarelto 20 mg  daily. He feels his rhythm has not been right for around 2 weeks. 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 He states he has never had such  a busy fall season in his life. He is working long hours. He is  trying to move his son here to help.   Follow up in the AF clinic 02/09/23. Patient presented to the ED at [facility] 01/31/23 after falling off a trailer and hitting his head. He was found to have a SDH. Repeat CT was stable. He was discharged with neurology follow up. His Xarelto was held. His BB was discontinued due to bradycardia. He has noticed his BP reading at home have been more elevated.    Today, he denies symptoms of palpitations, chest pain, shortness of breath, orthopnea, PND, lower extremity edema, dizziness, presyncope, syncope, or neurologic sequela. The patient is tolerating medications without difficulties and is otherwise without complaint today.   Past Medical History:  Diagnosis Date   AAA (abdominal aortic aneurysm) (HCC)    06/27/20 MRI: 3.0 cm infrarenal AAA, recommend 3 year follow-up   Anxiety    Atrial fibrillation (HCC)    Bilateral carotid artery disease (HCC) 07/21/2013   CAD (coronary artery disease) 2008   CABG   COPD (chronic obstructive pulmonary disease) (  HCC)    Dysrhythmia    A-fib   Gastritis    Headache(784.0)    Hypertension    Visit for monitoring Tikosyn therapy 12/2015   Past Surgical History:  Procedure Laterality Date   CARDIOVERSION N/A 09/08/2015   Procedure: CARDIOVERSION;  Surgeon: Vesta Mixer, MD;  Location: [facility] ENDOSCOPY;  Service: Cardiovascular;  Laterality: N/A;   CORONARY ARTERY BYPASS GRAFT     EXCISION OF KELOID Left 10/08/2018   Procedure: EXCISION OF CHRONIC ABDOMINAL WALL WOUND;  Surgeon: Abigail Miyamoto, MD;  Location: [facility];  Service: General;  Laterality: Left;   INCISION AND DRAINAGE DEEP NECK ABSCESS     LUMBAR LAMINECTOMY/DECOMPRESSION  MICRODISCECTOMY Right 08/09/2020   Procedure: Laminectomy and Foraminotomy - Lumbar three-Lumbar four - Lumbar four-Lumbar five- right;  Surgeon: Donalee Citrin, MD;  Location: [facility] OR;  Service: Neurosurgery;  Laterality: Right;   ROTATOR CUFF REPAIR Bilateral    TEE WITHOUT CARDIOVERSION N/A 08/02/2015   Procedure: TRANSESOPHAGEAL ECHOCARDIOGRAM (TEE);  Surgeon: Quintella Reichert, MD;  Location: [facility] ENDOSCOPY;  Service: Cardiovascular;  Laterality: N/A;   TEE WITHOUT CARDIOVERSION N/A 09/08/2015   Procedure: TRANSESOPHAGEAL ECHOCARDIOGRAM (TEE);  Surgeon: Vesta Mixer, MD;  Location: [facility] ENDOSCOPY;  Service: Cardiovascular;  Laterality: N/A;    Current Outpatient Medications  Medication Sig Dispense Refill   ALPRAZolam (XANAX) 0.25 MG tablet TAKE 1 TABLET(0.25 MG) BY MOUTH DAILY 10 tablet 0   amLODipine (NORVASC) 5 MG tablet Take 1/2 tablet by mouth daily (Patient taking differently: Take 1/2 tablet by mouth at bedtime) 45 tablet 3   atorvastatin (LIPITOR) 40 MG tablet TAKE 1 TABLET(40 MG) BY MOUTH DAILY 90 tablet 1   diltiazem (CARDIZEM) 30 MG tablet Take 1 tablet every 4 hours AS NEEDED for heart rate >100 as long as blood pressure >100. 45 tablet 1   dofetilide (TIKOSYN) 500 MCG capsule TAKE 1 CAPSULE(500 MCG) BY MOUTH TWICE DAILY 180 capsule 1   lisinopril (ZESTRIL) 20 MG tablet TAKE 1 TABLET(20 MG) BY MOUTH DAILY 90 tablet 1   potassium chloride (KLOR-CON) 10 MEQ tablet TAKE 1 TABLET(10 MEQ) BY MOUTH DAILY (Patient taking differently: TAKE 1 TABLET(10 MEQ) BY MOUTH AT BEDTIME) 90 tablet 2   Multiple Vitamins-Minerals (MULTIVITAMIN WITH MINERALS) tablet Take 1 tablet by mouth at bedtime.  (Patient not taking: Reported on 02/09/2023)     XARELTO 20 MG TABS tablet TAKE 1 TABLET(20 MG) BY MOUTH DAILY WITH SUPPER (Patient not taking: Reported on 02/09/2023) 30 tablet 11   No current facility-administered medications for this encounter.    Allergies  Allergen Reactions   Doxycycline Shortness Of  Breath    Also has to urinate more often   Nsaids Hypertension    Gastritis     Social History   Socioeconomic History   Marital status: Married    Spouse name: Not on file   Number of children: Not on file   Years of education: Not on file   Highest education level: Not on file  Occupational History   Not on file  Tobacco Use   Smoking status: Former   Smokeless tobacco: Former    Types: Associate Professor Use: Never used  Substance and Sexual Activity   Alcohol use: No    Comment: quit drinking in the 70's   Drug use: No   Sexual activity: Not on file  Other Topics Concern   Not on file  Social History Narrative   Not on file  Social Determinants of Health   Financial Resource Strain: Not on file  Food Insecurity: No Food Insecurity (04/21/2022)   Hunger Vital Sign    Worried About Running Out of Food in the Last Year: Never true    Ran Out of Food in the Last Year: Never true  Transportation Needs: No Transportation Needs (04/21/2022)   PRAPARE - Administrator, Civil Service (Medical): No    Lack of Transportation (Non-Medical): No  Physical Activity: Not on file  Stress: Not on file  Social Connections: Not on file  Intimate Partner Violence: Not on file    Family History  Problem Relation Age of Onset   Diabetes Mother    Hypertension Mother    Heart attack Father    Sudden death Father    Hypertension Sister    Diabetes Sister     ROS- All systems are reviewed and negative except as per the HPI above  Physical Exam: Vitals:   02/09/23 1003  BP: (!) 160/80  Pulse: (!) 56  Weight: 64.2 kg  Height: 5\' 5"  (1.651 m)    GEN- The patient is a well appearing elderly male, alert and oriented x 3 today.   HEENT-head normocephalic, atraumatic, sclera clear, conjunctiva pink, hearing intact, trachea midline. Lungs- Clear to ausculation bilaterally, normal work of breathing Heart- Regular rate and rhythm, no murmurs, rubs or gallops   GI- soft, NT, ND, + BS Extremities- no clubbing, cyanosis, or edema MS- no significant deformity or atrophy Skin- no rash or lesion Psych- euthymic mood, full affect Neuro- strength and sensation are intact   EKG today demonstrates SB, NST Vent. rate 56 BPM PR interval 164 ms QRS duration 82 ms QT/QTcB 484/467 ms   Epic records reviewed   CHA2DS2-VASc Score = 4  The patient's score is based upon: CHF History: 0 HTN History: 1 Diabetes History: 0 Stroke History: 0 Vascular Disease History: 1 Age Score: 2 Gender Score: 0       ASSESSMENT AND PLAN: 1. Paroxysmal Atrial Fibrillation (ICD10:  I48.0) The patient's CHA2DS2-VASc score is 4, indicating a 4.8% annual risk of stroke.   Patient appears to be maintaining SR.  Continue dofetilide 500 mg BID. QT stable.  Currently off Xarelto with recent traumatic subdural hematoma.  Recent bmet/mag reviewed.  BB recently discontinued at [facility] due to bradycardia.   2. Secondary Hypercoagulable State (ICD10:  D68.69) The patient is at significant risk for stroke/thromboembolism based upon his CHA2DS2-VASc Score of 4.  However, the patient is not on anticoagulation due to his high bleeding risk following traumatic SDH. Plan to resume Xarelto per neurology who he sees 6/18.   3. HTN Elevated today and at home. Will increase amlodipine to 5 mg daily since he is off metoprolol.   4. CAD S/p CABG 2008 Patient requesting nitro to have on hand, he has not had any anginal symptoms.    Follow up in the AF clinic in 6 months.    Jorja Loa PA-C Afib Clinic [facility] [address] [place], [place] [address] [phone]

## 2023-02-09 NOTE — Patient Instructions (Signed)
Increase amlodipine to 5 mg once a day
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
## 2023-02-13 ENCOUNTER — Encounter: Payer: Self-pay | Admitting: Neurology

## 2023-02-13 ENCOUNTER — Ambulatory Visit (INDEPENDENT_AMBULATORY_CARE_PROVIDER_SITE_OTHER): Payer: Medicare Other | Admitting: Neurology

## 2023-02-13 VITALS — BP 126/70 | HR 61 | Ht 65.0 in | Wt 140.5 lb

## 2023-02-13 DIAGNOSIS — W19XXXA Unspecified fall, initial encounter: Secondary | ICD-10-CM

## 2023-02-13 DIAGNOSIS — S0990XA Unspecified injury of head, initial encounter: Secondary | ICD-10-CM | POA: Insufficient documentation

## 2023-02-13 DIAGNOSIS — S065X1A Traumatic subdural hemorrhage with loss of consciousness of 30 minutes or less, initial encounter: Secondary | ICD-10-CM

## 2023-02-13 DIAGNOSIS — I480 Paroxysmal atrial fibrillation: Secondary | ICD-10-CM

## 2023-02-13 DIAGNOSIS — S065XAA Traumatic subdural hemorrhage with loss of consciousness status unknown, initial encounter: Secondary | ICD-10-CM | POA: Insufficient documentation

## 2023-02-14 ENCOUNTER — Other Ambulatory Visit (HOSPITAL_COMMUNITY): Payer: Self-pay | Admitting: *Deleted

## 2023-02-14 DIAGNOSIS — I480 Paroxysmal atrial fibrillation: Secondary | ICD-10-CM

## 2023-02-14 MED ORDER — RIVAROXABAN 20 MG PO TABS: ORAL_TABLET | ORAL | 6 refills | Status: DC

## 2023-03-21 ENCOUNTER — Telehealth: Payer: Self-pay | Admitting: Neurology

## 2023-03-21 DIAGNOSIS — S0990XA Unspecified injury of head, initial encounter: Secondary | ICD-10-CM

## 2023-03-21 NOTE — Telephone Encounter (Signed)
Pt wife aware mri ordered and I stated someone will be in touch asap

## 2023-03-21 NOTE — Telephone Encounter (Signed)
Wife calling to report that she believes from the 06-05 fall pt is noticeably off balance and the spot/area on head where he fell on concrete, wife said that area does not feel well.  Wife would like a call to discuss a MRI being ordered, please call.

## 2023-03-23 ENCOUNTER — Telehealth (HOSPITAL_COMMUNITY): Payer: Self-pay | Admitting: *Deleted

## 2023-03-23 NOTE — Telephone Encounter (Signed)
Patient's wife called in stating patient is having right leg weakness that is progressively worsening over the last week and he feels his head "is not right" they were not sure how to describe this. Pt is now extremely unsteady on his feet to the point of using a walker. They deny right upper arm weakness but the right leg is profoundly weak. Instructed patient wife to notify neurologist immediately regarding symptom of right leg weakness as pt may need to be assessed today. Pt wife stated she will call their office now.

## 2023-03-23 NOTE — Telephone Encounter (Addendum)
Pt's wife want to make Dr. Epimenio Foot aware MRI scheduled Monday 7/29  at [facility] afternoon. Wanted Dr. Epimenio Foot to know his right leg is weak.

## 2023-03-26 ENCOUNTER — Encounter (HOSPITAL_COMMUNITY): Payer: Self-pay | Admitting: Emergency Medicine

## 2023-03-26 ENCOUNTER — Other Ambulatory Visit: Payer: Self-pay

## 2023-03-26 ENCOUNTER — Ambulatory Visit (HOSPITAL_COMMUNITY): Admission: RE | Admit: 2023-03-26 | Payer: Medicare Other | Source: Ambulatory Visit

## 2023-03-26 ENCOUNTER — Emergency Department (HOSPITAL_COMMUNITY): Payer: Medicare Other

## 2023-03-26 ENCOUNTER — Inpatient Hospital Stay (HOSPITAL_COMMUNITY)
Admission: EM | Admit: 2023-03-26 | Discharge: 2023-03-28 | DRG: 025 | Disposition: A | Discharge status: Home Health | Payer: Medicare Other | Attending: Family Medicine | Admitting: Family Medicine

## 2023-03-26 DIAGNOSIS — G935 Compression of brain: Secondary | ICD-10-CM | POA: Diagnosis present

## 2023-03-26 DIAGNOSIS — Z833 Family history of diabetes mellitus: Secondary | ICD-10-CM | POA: Diagnosis not present

## 2023-03-26 DIAGNOSIS — R296 Repeated falls: Secondary | ICD-10-CM | POA: Diagnosis present

## 2023-03-26 DIAGNOSIS — I7143 Infrarenal abdominal aortic aneurysm, without rupture: Secondary | ICD-10-CM | POA: Diagnosis present

## 2023-03-26 DIAGNOSIS — G8191 Hemiplegia, unspecified affecting right dominant side: Secondary | ICD-10-CM | POA: Diagnosis not present

## 2023-03-26 DIAGNOSIS — S065X0A Traumatic subdural hemorrhage without loss of consciousness, initial encounter: Secondary | ICD-10-CM | POA: Diagnosis not present

## 2023-03-26 DIAGNOSIS — J449 Chronic obstructive pulmonary disease, unspecified: Secondary | ICD-10-CM | POA: Diagnosis present

## 2023-03-26 DIAGNOSIS — R269 Unspecified abnormalities of gait and mobility: Secondary | ICD-10-CM

## 2023-03-26 DIAGNOSIS — T45515A Adverse effect of anticoagulants, initial encounter: Secondary | ICD-10-CM | POA: Diagnosis present

## 2023-03-26 DIAGNOSIS — S0990XA Unspecified injury of head, initial encounter: Secondary | ICD-10-CM | POA: Diagnosis not present

## 2023-03-26 DIAGNOSIS — W19XXXA Unspecified fall, initial encounter: Secondary | ICD-10-CM | POA: Diagnosis present

## 2023-03-26 DIAGNOSIS — Z7901 Long term (current) use of anticoagulants: Secondary | ICD-10-CM

## 2023-03-26 DIAGNOSIS — Z881 Allergy status to other antibiotic agents status: Secondary | ICD-10-CM | POA: Diagnosis not present

## 2023-03-26 DIAGNOSIS — I10 Essential (primary) hypertension: Secondary | ICD-10-CM | POA: Diagnosis present

## 2023-03-26 DIAGNOSIS — I480 Paroxysmal atrial fibrillation: Secondary | ICD-10-CM | POA: Diagnosis not present

## 2023-03-26 DIAGNOSIS — Z8249 Family history of ischemic heart disease and other diseases of the circulatory system: Secondary | ICD-10-CM

## 2023-03-26 DIAGNOSIS — R531 Weakness: Secondary | ICD-10-CM

## 2023-03-26 DIAGNOSIS — I4891 Unspecified atrial fibrillation: Secondary | ICD-10-CM | POA: Diagnosis present

## 2023-03-26 DIAGNOSIS — Z951 Presence of aortocoronary bypass graft: Secondary | ICD-10-CM | POA: Diagnosis not present

## 2023-03-26 DIAGNOSIS — Z886 Allergy status to analgesic agent status: Secondary | ICD-10-CM | POA: Diagnosis not present

## 2023-03-26 DIAGNOSIS — F419 Anxiety disorder, unspecified: Secondary | ICD-10-CM | POA: Diagnosis present

## 2023-03-26 DIAGNOSIS — D6832 Hemorrhagic disorder due to extrinsic circulating anticoagulants: Secondary | ICD-10-CM | POA: Diagnosis present

## 2023-03-26 DIAGNOSIS — Z87891 Personal history of nicotine dependence: Secondary | ICD-10-CM | POA: Diagnosis not present

## 2023-03-26 DIAGNOSIS — I6202 Nontraumatic subacute subdural hemorrhage: Secondary | ICD-10-CM | POA: Diagnosis not present

## 2023-03-26 DIAGNOSIS — S065XAA Traumatic subdural hemorrhage with loss of consciousness status unknown, initial encounter: Principal | ICD-10-CM | POA: Diagnosis present

## 2023-03-26 DIAGNOSIS — Z9181 History of falling: Secondary | ICD-10-CM | POA: Diagnosis not present

## 2023-03-26 DIAGNOSIS — I2510 Atherosclerotic heart disease of native coronary artery without angina pectoris: Secondary | ICD-10-CM | POA: Diagnosis present

## 2023-03-26 DIAGNOSIS — Z79899 Other long term (current) drug therapy: Secondary | ICD-10-CM

## 2023-03-26 DIAGNOSIS — R270 Ataxia, unspecified: Secondary | ICD-10-CM | POA: Diagnosis not present

## 2023-03-26 DIAGNOSIS — I6782 Cerebral ischemia: Secondary | ICD-10-CM | POA: Diagnosis not present

## 2023-03-26 DIAGNOSIS — I6200 Nontraumatic subdural hemorrhage, unspecified: Secondary | ICD-10-CM | POA: Diagnosis not present

## 2023-03-26 LAB — CBC WITH DIFFERENTIAL/PLATELET
Abs Immature Granulocytes: 0.03 10*3/uL (ref 0.00–0.07)
Basophils Absolute: 0 10*3/uL (ref 0.0–0.1)
Basophils Relative: 0 %
Eosinophils Absolute: 0.1 10*3/uL (ref 0.0–0.5)
Eosinophils Relative: 1 %
HCT: 42.4 % (ref 39.0–52.0)
Hemoglobin: 13.2 g/dL (ref 13.0–17.0)
Immature Granulocytes: 0 %
Lymphocytes Relative: 19 %
Lymphs Abs: 1.7 10*3/uL (ref 0.7–4.0)
MCH: 30.1 pg (ref 26.0–34.0)
MCHC: 31.1 g/dL (ref 30.0–36.0)
MCV: 96.8 fL (ref 80.0–100.0)
Monocytes Absolute: 0.8 10*3/uL (ref 0.1–1.0)
Monocytes Relative: 9 %
Neutro Abs: 6.4 10*3/uL (ref 1.7–7.7)
Neutrophils Relative %: 71 %
Platelets: 296 10*3/uL (ref 150–400)
RBC: 4.38 MIL/uL (ref 4.22–5.81)
RDW: 13.6 % (ref 11.5–15.5)
WBC: 9 10*3/uL (ref 4.0–10.5)
nRBC: 0 % (ref 0.0–0.2)

## 2023-03-26 LAB — CBG MONITORING, ED
Glucose-Capillary: 87 mg/dL (ref 70–99)
Glucose-Capillary: 115 mg/dL — ABNORMAL HIGH (ref 70–99)

## 2023-03-26 LAB — BASIC METABOLIC PANEL
Anion gap: 9 (ref 5–15)
BUN: 13 mg/dL (ref 8–23)
CO2: 24 mmol/L (ref 22–32)
Calcium: 9.1 mg/dL (ref 8.9–10.3)
Chloride: 104 mmol/L (ref 98–111)
Creatinine, Ser: 1.27 mg/dL — ABNORMAL HIGH (ref 0.61–1.24)
GFR, Estimated: 57 mL/min — ABNORMAL LOW (ref >60)
Glucose, Bld: 105 mg/dL — ABNORMAL HIGH (ref 70–99)
Potassium: 4.6 mmol/L (ref 3.5–5.1)
Sodium: 137 mmol/L (ref 135–145)

## 2023-03-26 LAB — SURGICAL PCR SCREEN
MRSA, PCR: NEGATIVE
Staphylococcus aureus: NEGATIVE

## 2023-03-26 LAB — PROTIME-INR
INR: 2.5 — ABNORMAL HIGH (ref 0.8–1.2)
Prothrombin Time: 27.5 seconds — ABNORMAL HIGH (ref 11.4–15.2)

## 2023-03-26 MED ORDER — DOFETILIDE 500 MCG PO CAPS
500.0000 ug | ORAL_CAPSULE | Freq: Two times a day (BID) | ORAL | Status: DC
  Administered 2023-03-26 – 2023-03-27 (×2): 500 ug via ORAL
  Filled 2023-03-26 (×3): qty 1

## 2023-03-26 MED ORDER — LISINOPRIL 20 MG PO TABS
20.0000 mg | ORAL_TABLET | Freq: Every day | ORAL | Status: DC
  Administered 2023-03-27 – 2023-03-28 (×2): 20 mg via ORAL
  Filled 2023-03-26 (×2): qty 1

## 2023-03-26 MED ORDER — ATORVASTATIN CALCIUM 40 MG PO TABS
40.0000 mg | ORAL_TABLET | Freq: Every day | ORAL | Status: DC
  Administered 2023-03-27 – 2023-03-28 (×2): 40 mg via ORAL
  Filled 2023-03-26 (×2): qty 1

## 2023-03-26 MED ORDER — ALPRAZOLAM 0.25 MG PO TABS
0.2500 mg | ORAL_TABLET | Freq: Every day | ORAL | Status: DC
  Administered 2023-03-26 – 2023-03-28 (×2): 0.25 mg via ORAL
  Filled 2023-03-26 (×2): qty 1

## 2023-03-26 MED ORDER — EMPTY CONTAINERS FLEXIBLE MISC
900.0000 mg | Freq: Once | Status: AC
  Administered 2023-03-26: 900 mg via INTRAVENOUS
  Filled 2023-03-26: qty 90

## 2023-03-26 MED ORDER — AMLODIPINE BESYLATE 5 MG PO TABS
5.0000 mg | ORAL_TABLET | Freq: Every day | ORAL | Status: DC
  Administered 2023-03-27 – 2023-03-28 (×2): 5 mg via ORAL
  Filled 2023-03-26 (×2): qty 1

## 2023-03-26 NOTE — Progress Notes (Signed)
Full note to follow. 77 y/o male with h/o A fib on Xarelto, he presented to the ED with right sided UE and LE weakness that has been progressively worsening for the last 7-14 days. He was seen at [facility] on 01/31/23 after a fall, he was diagnosed with a SHD but did not require any neurosurgical intervention at that time. He was discharged home without any neuro issues other than some difficulty with balance. He resumed his Xarelto as directed, last dose of Xarelto was 03/25/23. CTH was personally reviewed showing a 2.6 cm left cerebral convexity acute / subacute subdural hematoma with 7 mm rightward midline shift. Will plan to bring him to the OR tomorrow afternoon for a left burr hole with evacuation of SHD. Hold Xarelto. NPO at midnight.   Emilee Hero, PA-C

## 2023-03-26 NOTE — ED Notes (Signed)
ED TO INPATIENT HANDOFF REPORT  ED Nurse Name and Phone #: [phone]  S Name/Age/Gender [name] 77 y.o. male Room/Bed: 037C/037C  Code Status   Code Status: Full Code  Home/SNF/Other Home Patient oriented to: self, place, time, and situation Is this baseline? Yes   Triage Complete: Triage complete  Chief Complaint Subdural hematoma (HCC) [S06.5XAA]  Triage Note Pt here from home today with c/o right leg weakness times 2 weeks , pt has fall back in June and suffered a SDH, was doing ok until 2 weeks ago , is on blood thinners and has had several small falls since    Allergies Allergies  Allergen Reactions   Doxycycline Shortness Of Breath    Also has to urinate more often   Nsaids Hypertension    Gastritis     Level of Care/Admitting Diagnosis ED Disposition     ED Disposition  Admit   Condition  --   Comment  Hospital Area: [facility] [[id number]]  Level of Care: Progressive [102]  Admit to Progressive based on following criteria: NEUROLOGICAL AND NEUROSURGICAL complex patients with significant risk of instability, who do not meet ICU criteria, yet require close observation or frequent assessment (< / = every 2 - 4 hours) with medical / nursing intervention.  Admit to Progressive based on following criteria: CARDIOVASCULAR & THORACIC of moderate stability with acute coronary syndrome symptoms/low risk myocardial infarction/hypertensive urgency/arrhythmias/heart failure potentially compromising stability and stable post cardiovascular intervention patients.  May admit patient to [facility] or [facility] if equivalent level of care is available:: No  Covid Evaluation: Asymptomatic - no recent exposure (last 10 days) testing not required  Diagnosis: Subdural hematoma [facility]) [[id number]]  Admitting Physician: Penne Lash [[id number]]  Attending Physician: Perley Jain, TODD D [[id number]]  Certification:: I certify this patient will need inpatient services  for at least 2 midnights  Estimated Length of Stay: 3          B Medical/Surgery History Past Medical History:  Diagnosis Date   AAA (abdominal aortic aneurysm) (HCC)    06/27/20 MRI: 3.0 cm infrarenal AAA, recommend 3 year follow-up   Anxiety    Atrial fibrillation (HCC)    Bilateral carotid artery disease (HCC) 07/21/2013   CAD (coronary artery disease) 2008   CABG   COPD (chronic obstructive pulmonary disease) (HCC)    Dysrhythmia    A-fib   Gastritis    Headache(784.0)    Hypertension    Visit for monitoring Tikosyn therapy 12/2015   Past Surgical History:  Procedure Laterality Date   CARDIOVERSION N/A 09/08/2015   Procedure: CARDIOVERSION;  Surgeon: Vesta Mixer, MD;  Location: [facility] ENDOSCOPY;  Service: Cardiovascular;  Laterality: N/A;   CORONARY ARTERY BYPASS GRAFT     EXCISION OF KELOID Left 10/08/2018   Procedure: EXCISION OF CHRONIC ABDOMINAL WALL WOUND;  Surgeon: Abigail Miyamoto, MD;  Location: [facility];  Service: General;  Laterality: Left;   INCISION AND DRAINAGE DEEP NECK ABSCESS     LUMBAR LAMINECTOMY/DECOMPRESSION MICRODISCECTOMY Right 08/09/2020   Procedure: Laminectomy and Foraminotomy - Lumbar three-Lumbar four - Lumbar four-Lumbar five- right;  Surgeon: Donalee Citrin, MD;  Location: [facility] OR;  Service: Neurosurgery;  Laterality: Right;   ROTATOR CUFF REPAIR Bilateral    TEE WITHOUT CARDIOVERSION N/A 08/02/2015   Procedure: TRANSESOPHAGEAL ECHOCARDIOGRAM (TEE);  Surgeon: Quintella Reichert, MD;  Location: [facility] ENDOSCOPY;  Service: Cardiovascular;  Laterality: N/A;   TEE WITHOUT CARDIOVERSION N/A 09/08/2015   Procedure: TRANSESOPHAGEAL ECHOCARDIOGRAM (  TEE);  Surgeon: Vesta Mixer, MD;  Location: [facility] ENDOSCOPY;  Service: Cardiovascular;  Laterality: N/A;     A IV Location/Drains/Wounds Patient Lines/Drains/Airways Status     Active Line/Drains/Airways     Name Placement date Placement time Site Days   Peripheral IV 03/26/23 20 G Right  Antecubital 03/26/23  1230  Antecubital  less than 1            Intake/Output Last 24 hours No intake or output data in the 24 hours ending 03/26/23 1844  Labs/Imaging Results for orders placed or performed during the hospital encounter of 03/26/23 (from the past 48 hour(s))  CBG monitoring, ED     Status: None   Collection Time: 03/26/23 10:59 AM  Result Value Ref Range   Glucose-Capillary 87 70 - 99 mg/dL    Comment: Glucose reference range applies only to samples taken after fasting for at least 8 hours.   Comment 1 Notify RN    Comment 2 Document in Chart   CBC with Differential     Status: None   Collection Time: 03/26/23 12:09 PM  Result Value Ref Range   WBC 9.0 4.0 - 10.5 K/uL   RBC 4.38 4.22 - 5.81 MIL/uL   Hemoglobin 13.2 13.0 - 17.0 g/dL   HCT 16.1 09.6 - 04.5 %   MCV 96.8 80.0 - 100.0 fL   MCH 30.1 26.0 - 34.0 pg   MCHC 31.1 30.0 - 36.0 g/dL   RDW 40.9 81.1 - 91.4 %   Platelets 296 150 - 400 K/uL   nRBC 0.0 0.0 - 0.2 %   Neutrophils Relative % 71 %   Neutro Abs 6.4 1.7 - 7.7 K/uL   Lymphocytes Relative 19 %   Lymphs Abs 1.7 0.7 - 4.0 K/uL   Monocytes Relative 9 %   Monocytes Absolute 0.8 0.1 - 1.0 K/uL   Eosinophils Relative 1 %   Eosinophils Absolute 0.1 0.0 - 0.5 K/uL   Basophils Relative 0 %   Basophils Absolute 0.0 0.0 - 0.1 K/uL   Immature Granulocytes 0 %   Abs Immature Granulocytes 0.03 0.00 - 0.07 K/uL    Comment: Performed at [facility], [address] [place], [place] [address]  Basic metabolic panel     Status: Abnormal   Collection Time: 03/26/23 12:09 PM  Result Value Ref Range   Sodium 137 135 - 145 mmol/L   Potassium 4.6 3.5 - 5.1 mmol/L   Chloride 104 98 - 111 mmol/L   CO2 24 22 - 32 mmol/L   Glucose, Bld 105 (H) 70 - 99 mg/dL    Comment: Glucose reference range applies only to samples taken after fasting for at least 8 hours.   BUN 13 8 - 23 mg/dL   Creatinine, Ser 6.21 (H) 0.61 - 1.24 mg/dL   Calcium 9.1 8.9 - 30.8 mg/dL    GFR, Estimated 57 (L) >60 mL/min    Comment: (NOTE) Calculated using the CKD-EPI Creatinine Equation (2021)    Anion gap 9 5 - 15    Comment: Performed at [facility], [address], [place], [place] [address]  Protime-INR     Status: Abnormal   Collection Time: 03/26/23 12:09 PM  Result Value Ref Range   Prothrombin Time 27.5 (H) 11.4 - 15.2 seconds   INR 2.5 (H) 0.8 - 1.2    Comment: (NOTE) INR goal varies based on device and disease states. Performed at [facility], [address] [place], [place] [address]   CBG monitoring, ED     Status: Abnormal   Collection Time: 03/26/23  4:49 PM  Result Value Ref Range   Glucose-Capillary 115 (H) 70 - 99 mg/dL    Comment: Glucose reference range applies only to samples taken after fasting for at least 8 hours.   CT Head Wo Contrast  Result Date: 03/26/2023 CLINICAL DATA:  Head trauma, moderate-severe R sided weakness, recent brain bleed EXAM: CT HEAD WITHOUT CONTRAST TECHNIQUE: Contiguous axial images were obtained from the base of the skull through the vertex without intravenous contrast. RADIATION DOSE REDUCTION: This exam was performed according to the departmental dose-optimization program which includes automated exposure control, adjustment of the mA and/or kV according to patient size and/or use of iterative reconstruction technique. COMPARISON:  CT Head 11/25/08 FINDINGS: Brain: There is a 2.6 cm left cerebral convexity acute to subacute subdural hematoma. There is 7 mm rightward midline shift. No CT evidence of an acute cortical infarct. No evidence of subarachnoid hemorrhage. No evidence of intraventricular blood products. There is mass effect on the left lateral ventricular system. Vascular: No hyperdense vessel or unexpected calcification. Skull: Normal. Negative for fracture or focal lesion. Sinuses/Orbits: No middle ear or mastoid effusion. Paranasal sinuses are clear. Orbits are unremarkable. Other: None IMPRESSION:  2.6 cm left cerebral convexity acute to subacute subdural hematoma with 7 mm rightward midline shift. Electronically Signed   By: Lorenza Cambridge M.D.   On: 03/26/2023 12:12    Pending Labs Unresulted Labs (From admission, onward)     Start     Ordered   03/27/23 0500  Basic metabolic panel  Tomorrow morning,   R        03/26/23 1500   03/27/23 0500  CBC  Tomorrow morning,   R        03/26/23 1500            Vitals/Pain Today's Vitals   03/26/23 1145 03/26/23 1627 03/26/23 1825 03/26/23 1837  BP: 135/77  (!) 159/89   Pulse: 64  72   Resp: 19  18   Temp:  97.9 F (36.6 C)    TempSrc:  Oral    SpO2: 96%  97%   PainSc:    0-No pain    Isolation Precautions No active isolations  Medications Medications  amLODipine (NORVASC) tablet 5 mg (has no administration in time range)  atorvastatin (LIPITOR) tablet 40 mg (has no administration in time range)  dofetilide (TIKOSYN) capsule 500 mcg (has no administration in time range)  ALPRAZolam (XANAX) tablet 0.25 mg (has no administration in time range)  lisinopril (ZESTRIL) tablet 20 mg (has no administration in time range)  coag fact Xa recombinant (ANDEXXA) low dose infusion 900 mg (900 mg Intravenous New Bag/Given 03/26/23 1407)    Mobility walks with device     Focused Assessments     R Recommendations: See Admitting Provider Note  Report given to:   Additional Notes: CT show patient had a midline shift from his subdural hematoma. He did receive  Andexxa

## 2023-03-26 NOTE — ED Notes (Signed)
Waiting on filter line to be tubed down from pharmacy

## 2023-03-26 NOTE — H&P (Addendum)
Hospital Admission History and Physical Service Pager: [phone]  Patient name: [name] Medical record number: [id number] Date of Birth: 1946-05-27 Age: 77 y.o. Gender: male  Primary Care Provider: Benita Stabile, MD Consultants: Neurosurgery  Code Status: Full Code  Preferred Emergency Contact:  Contact Information     Name Relation Home Work Mobile   Enoch Spouse [phone]       Other Contacts   None on File      Chief Complaint: New onset weakness to right leg and arm in the setting of recent fall with known subdural   Assessment and Plan: [name] is a 77 y.o. male presenting with progressive R sided weakness. Patient is s/p fall on 6/5 after which he was found to have stable and unchanged SDH not requiring intervention. His Xarelto was held for about a week and subsequently restarted. Now with progressive weakness and multiple falls over the past few weeks. CT demonstrates a rather large left-sided SDH. Received Andexxa in the ED.  Admitted for Xarelto washout ahead of burr holes with NSGY. Last Xarelto dose was the evening of 7/28.    [facility]     * (Principal) Subdural hematoma [facility])     Patient presenting to ED after falling 6/5 found to have SDH with  midline shift. Patient received Andexxa in ED.  Neurosurgery consulted who recommended surgical decompression for 7/30. [name]  Perioperative Risk 0.4% and patient is able to complete 4 METs of activity without issue. Benefits likely outweigh risks for surgical decompression. Patient and family are in agreement to pursue surgical intervention.   - Admit to FMTS, attending Mcdiarmid, Progressive unit - Regular diet, NPO at midnight   - PT/OT - VTE prophylaxis: SCDs - neurosurgery consulted, plan for burr holes 7/30 - neuro checks q2h - Given Andexxa administration, low threshold for EKG and trop should he develop CP/SOB, he is at increased MI risk - STAT CT Head for changes in  mental status  - holding xarelto - AM CBC/BMP  - Fall precautions    Chronic and Stable Conditions:  A fib: continue tikosyn 500 mcg daily, holding xarelto in setting of acute bleed  CAD s/p CABGx5: lipitor 40mg , amlodipine 5 mg  COPD: does not require any inhalers or home oxygen, stable on room air  HTN: Lisinopril 20 mg  Anxiety: xanax 0.25 mg at bedtime   FEN/GI: Full VTE Prophylaxis: SCDs in setting of acute SDH  Disposition: Progressive   History of Present Illness:  [name] is a 77 y.o. male presenting with his right-sided weakness.  Patient reports that on June 5 he had a fall and hit his head for which he went to a hospital. He did have loss of consciousness with this fall.   He was diagnosed with a small subdural which was monitored and did not require neurosurgical intervention.  He was discharged and told hold his Xarelto for 1 week.  He reports about 2 weeks ago a coworker noticed that he was dragging his right foot started to get worse and about 5 days ago he noticed creased weakness in his right upper extremity.  He reports that he has had balance issues since this started and had another fall yesterday where fell to the ground from his bed. Patient denies hitting his head or any loss of consciousness since initial event. He states that he has been taking his Xarelto as prescribed and his last  dose was yesterday. Patient denies any changes in his vision, changes in speech, or changes in sensation.  Per wife, patient has been acting like himself and able to communicate effectively.  She reports she has only noticed that he has been weaker on his right side but has not noticed any changes in his cognition or speech.  In the ED, patient was given Andexxa in setting of acute subdural hematoma on Xarelto.  CT head without contrast was conducted which showed acute to subacute subdural hematoma on the left side with a rightward midline shift.  Neurosurgery was consulted who  recommended admission and surgical evacuation of subdural hematoma with burr hole.  Review Of Systems: Per HPI  Pertinent Past Medical History: Paroxsymal A fib on AC CAD s/p CABG x5 COPD HTN Anxiety  AAA noted 05/2020  Remainder reviewed in history tab.   Pertinent Past Surgical History: CABG Lumbar laminectomy Bilateral rotator cuff repair   Remainder reviewed in history tab.   Pertinent Social History: Tobacco use: Former smoker with >30 pack years, quit over 10 years ago Alcohol use: none currently  Other Substance use: denies Lives with wife  Pertinent Family History: Family History  Problem Relation Age of Onset   Diabetes Mother    Hypertension Mother    Heart attack Father    Sudden death Father    Hypertension Sister    Diabetes Sister   Remainder reviewed in history tab.   Important Outpatient Medications: Norvasc 5 mg Atorvastatin 40 mg  Diltiazem 30mg  q4h prn if hr>100  Tikosyn 400 mcg BID Lisinopril 20mg  daily Nitroglycerin 0.4mg  prn Potassium chloride 10 mEq Xarelto 20mg  po   Remainder reviewed in medication history.   Objective: BP 135/77   Pulse 64   Temp 97.9 F (36.6 C) (Oral)   Resp 19   SpO2 96%  Exam: Physical Exam Constitutional:      Appearance: Normal appearance.  HENT:     Head: Normocephalic and atraumatic.     Mouth/Throat:     Mouth: Mucous membranes are dry.     Pharynx: Oropharynx is clear.  Eyes:     Extraocular Movements: Extraocular movements intact.     Right eye: No nystagmus.     Left eye: No nystagmus.     Pupils: Pupils are equal, round, and reactive to light.  Cardiovascular:     Rate and Rhythm: Tachycardia present.     Pulses: Normal pulses.     Heart sounds: Normal heart sounds.     Comments: Irregularly irregular Pulmonary:     Effort: Pulmonary effort is normal.     Breath sounds: Normal breath sounds.  Abdominal:     General: Abdomen is flat. Bowel sounds are normal.     Palpations: Abdomen is  soft.  Musculoskeletal:     Cervical back: Normal range of motion and neck supple.  Skin:    Comments: Healing abrasion to back of head   Neurological:     Mental Status: He is alert and oriented to person, place, and time.     Comments: MENTAL STATUS: AAOx3, memory intact, fund of knowledge appropriate CRANIAL NERVES: II: Pupils equal and reactive,  III, IV, VI: EOM intact, no gaze preference or deviation, no nystagmus. V: normal sensation in V1, V2, and V3 segments bilaterally VII: no asymmetry, no nasolabial fold flattening VIII: normal hearing to speech IX, X: normal palatal elevation, no uvular deviation  MOTOR: 4/5 hand grip on the right. 5/5 on the left. Otherwise 5/5  strength throughout, with only slight weakness of the right side compared to left.    SENSORY: Normal to touch No hemineglect  COORD: Normal finger to nose, no tremor, no dysmetria      Labs:  CBC BMET  Recent Labs  Lab 03/26/23 1209  WBC 9.0  HGB 13.2  HCT 42.4  PLT 296   Recent Labs  Lab 03/26/23 1209  NA 137  K 4.6  CL 104  CO2 24  BUN 13  CREATININE 1.27*  GLUCOSE 105*  CALCIUM 9.1      EKG:   EKG: Atrial Fibrillation, vent rate 65. PAC.    Imaging Studies Performed:  CT head: 2.6 cm left cerebral convexity acute to subacute subdural hematoma with 7 mm rightward midline shift.  Independently reviewed and agree with radiologist's interpretation.   Georg Ruddle Mahnoor, MD 03/26/2023, 5:08 PM PGY-1, [facility]  FPTS Intern pager: [phone], text pages welcome Secure chat group [facility] Teaching Service    I have evaluated this patient along with Dr. Georg Ruddle and reviewed the above note, making necessary revisions.  Dorothyann Gibbs, MD 03/26/2023, 5:16 PM PGY-3, [facility]

## 2023-03-26 NOTE — Assessment & Plan Note (Addendum)
Patient presenting to ED after falling 6/5 found to have SDH with midline shift. Patient received Andexxa to reverse Xarelto in ED. Neurosurgery consulted who recommended surgical decompression for 7/30. Risk stratification as patients A fib on AC- calculated Gupta Perioperative Risk for Myocardial Infarction, which was 0.4% and patient is able to complete 4 METs of activity. Benefits likely outweigh risks for surgical decompression of bleeding.  - Admit to FMTS, attending Mcdiarmid - Progressive, Vital signs per floor - Regular diet, NPO at midnight   - PT/OT to treat - VTE prophylaxis: SCDs - neurosurgery consulted, plan for burr holes 7/30 - neuro checks q2h - holding xarelto in setting of SDH - AM CBC/BMP  - Fall precautions

## 2023-03-26 NOTE — ED Provider Notes (Signed)
[place] EMERGENCY DEPARTMENT AT [facility] Provider Note   CSN: [id number] Arrival date & time: 03/26/23  1043     History  Chief Complaint  Patient presents with   Extremity Weakness    [name] is a 77 y.o. male.  HPI     Pt comes in w/ cc of R sided weakness. Patient is accompanied to the emergency room by his wife. Patient has history of paroxysmal A-fib on Xarelto.  Last Xarelto dose was yesterday night.  Patient also has history of CAD/COPD.  Patient states that on June 5 he had a mechanical fall and went to outside hospital.  He had a subdural hematoma overnight.  He did not need any intervention.  He was discharged without any neuro issues, but he has noted persistent balance problems since that time.  According to the wife, patient has had multiple falls since the time of discharge.  He continues to take Xarelto, last dose was last night.  About 2 weeks ago, patient started dragging his right lower extremity.  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  Over the last 5 to 7 days patient has started noticing weakness in the right lower extremity and also right upper extremity.  Patient is unable to use his right hand like he normally does.  Patient is unsure if his symptoms are worsening, but patient's wife indicates that his symptoms are indeed still worsening.  Patient also has been having persistent falls according to her including 1 last night.   Home Medications Prior to Admission medications   Medication Sig Start Date End Date Taking? Authorizing Provider  ALPRAZolam Prudy Feeler) 0.25 MG tablet TAKE 1 TABLET(0.25 MG) BY MOUTH DAILY 11/12/20   Ladona Ridgel, Malena M, DO  amLODipine (NORVASC) 5 MG tablet Take 1 tablet (5 mg total) by mouth daily. 02/09/23   Fenton, Clint R, PA  atorvastatin (LIPITOR) 40 MG tablet TAKE 1 TABLET(40 MG) BY MOUTH DAILY 06/03/20   Ladona Ridgel, Malena M, DO  diltiazem (CARDIZEM) 30 MG tablet Take 1 tablet every 4 hours AS NEEDED for heart rate  >100 as long as blood pressure >100. 10/25/22   Newman Nip, NP  dofetilide (TIKOSYN) 500 MCG capsule TAKE 1 CAPSULE(500 MCG) BY MOUTH TWICE DAILY 01/02/23   Fenton, Clint R, PA  lisinopril (ZESTRIL) 20 MG tablet TAKE 1 TABLET(20 MG) BY MOUTH DAILY 06/03/20   Laroy Apple M, DO  Multiple Vitamins-Minerals (MULTIVITAMIN WITH MINERALS) tablet Take 1 tablet by mouth at bedtime.  Patient not taking: Reported on 02/09/2023    [provider]  nitroGLYCERIN (NITROSTAT) 0.4 MG SL tablet Place 1 tablet (0.4 mg total) under the tongue every 5 (five) minutes as needed for chest pain. 02/09/23   Jake Bathe, MD  potassium chloride (KLOR-CON) 10 MEQ tablet TAKE 1 TABLET(10 MEQ) BY MOUTH DAILY Patient taking differently: TAKE 1 TABLET(10 MEQ) BY MOUTH AT BEDTIME 11/22/22   Fenton, Clint R, PA  rivaroxaban (XARELTO) 20 MG TABS tablet TAKE 1 TABLET(20 MG) BY MOUTH DAILY WITH SUPPER 02/14/23   Fenton, Clint R, PA      Allergies    Doxycycline and Nsaids    Review of Systems   Review of Systems  All other systems reviewed and are negative.   Physical Exam Updated Vital Signs BP 135/77   Pulse 64   Temp 97.6 F (36.4 C) (Oral)   Resp 19   SpO2 96%  Physical Exam Vitals and nursing note reviewed.  Constitutional:  Appearance: He is well-developed.  HENT:     Head: Atraumatic.  Cardiovascular:     Rate and Rhythm: Normal rate.  Pulmonary:     Effort: Pulmonary effort is normal.  Musculoskeletal:     Cervical back: Neck supple.  Skin:    General: Skin is warm.  Neurological:     Mental Status: He is alert and oriented to person, place, and time.     Cranial Nerves: No cranial nerve deficit.     Sensory: No sensory deficit.     Motor: Weakness present.     Gait: Gait normal.     Comments: Patient's right upper extremity grip strength is lower.  Upper extremity strength is still bilaterally 4 out of 5 Patient has subtle weakness over the right lower extremity.  Upon  ambulation, patient noted to be dragging his right lower extremity.     ED Results / Procedures / Treatments   Labs (all labs ordered are listed, but only abnormal results are displayed) Labs Reviewed  BASIC METABOLIC PANEL - Abnormal; Notable for the following components:      Result Value   Glucose, Bld 105 (*)    Creatinine, Ser 1.27 (*)    GFR, Estimated 57 (*)    All other components within normal limits  PROTIME-INR - Abnormal; Notable for the following components:   Prothrombin Time 27.5 (*)    INR 2.5 (*)    All other components within normal limits  CBC WITH DIFFERENTIAL/PLATELET  CBG MONITORING, ED    EKG EKG Interpretation Date/Time:  Monday March 26 2023 11:35:30 EDT Ventricular Rate:  65 PR Interval:    QRS Duration:  81 QT Interval:  452 QTC Calculation: 470 R Axis:   35  Text Interpretation: Atrial fibrillation Ventricular premature complex No acute changes Nonspecific ST and T wave abnormality No significant change since last tracing Confirmed by Derwood Kaplan [phone]) on 03/26/2023 12:18:09 PM  Radiology CT Head Wo Contrast  Result Date: 03/26/2023 CLINICAL DATA:  Head trauma, moderate-severe R sided weakness, recent brain bleed EXAM: CT HEAD WITHOUT CONTRAST TECHNIQUE: Contiguous axial images were obtained from the base of the skull through the vertex without intravenous contrast. RADIATION DOSE REDUCTION: This exam was performed according to the departmental dose-optimization program which includes automated exposure control, adjustment of the mA and/or kV according to patient size and/or use of iterative reconstruction technique. COMPARISON:  CT Head 11/25/08 FINDINGS: Brain: There is a 2.6 cm left cerebral convexity acute to subacute subdural hematoma. There is 7 mm rightward midline shift. No CT evidence of an acute cortical infarct. No evidence of subarachnoid hemorrhage. No evidence of intraventricular blood products. There is mass effect on the left lateral  ventricular system. Vascular: No hyperdense vessel or unexpected calcification. Skull: Normal. Negative for fracture or focal lesion. Sinuses/Orbits: No middle ear or mastoid effusion. Paranasal sinuses are clear. Orbits are unremarkable. Other: None IMPRESSION: 2.6 cm left cerebral convexity acute to subacute subdural hematoma with 7 mm rightward midline shift. Electronically Signed   By: Lorenza Cambridge M.D.   On: 03/26/2023 12:12    Procedures .Critical Care  Performed by: Derwood Kaplan, MD Authorized by: Derwood Kaplan, MD   Critical care provider statement:    Critical care time (minutes):  49   Critical care was necessary to treat or prevent imminent or life-threatening deterioration of the following conditions:  Trauma and CNS failure or compromise   Critical care was time spent personally by me on the following activities:  Development of treatment plan with patient or surrogate, discussions with consultants, evaluation of patient's response to treatment, examination of patient, ordering and review of laboratory studies, ordering and review of radiographic studies, ordering and performing treatments and interventions, pulse oximetry, re-evaluation of patient's condition, review of old charts and obtaining history from patient or surrogate     Medications Ordered in ED Medications  coag fact Xa recombinant (ANDEXXA) low dose infusion 900 mg (900 mg Intravenous New Bag/Given 03/26/23 1316)    ED Course/ Medical Decision Making/ A&P                             Medical Decision Making Amount and/or Complexity of Data Reviewed Labs: ordered. Radiology: ordered.  Risk Decision regarding hospitalization.   This patient presents to the ED with chief complaint(s) of right-sided weakness with pertinent past medical history of A-fib for which he is on Xarelto, recent subdural hematoma on the left side on June 5.The complaint involves an extensive differential diagnosis and also carries  with it a high risk of complications and morbidity.    The differential diagnosis includes : Acute on chronic subdural hematoma, acute ischemic stroke, hemorrhagic stroke  The initial plan is to get CT scan of the head without contrast and basic labs.  Additional history obtained: Additional history obtained from spouse Records reviewed Care Everywhere/External Records.  I reviewed the CT scan from [facility] hospital.  Patient had trace left-sided brain bleed at that time.  Independent labs interpretation:  The following labs were independently interpreted: CBC reveals normal white count.  Independent visualization and interpretation of imaging: - I independently visualized the following imaging with scope of interpretation limited to determining acute life threatening conditions related to emergency care: CT scan of the brain, which revealed worsening left-sided subdural hematoma, with mild right-sided shift.  This case has also been discussed with the radiologist who confirms.  Treatment and Reassessment: Results of the ER workup discussed with the patient.  Wife confirms that she thinks that the patient is getting worse.  Patient still unclear if the symptoms are stable for the last 5 days or worsening.  They both confirm that he had a fall yesterday.  Given this information, I decided to reverse the Xarelto which was last taken last night with Andexxa.  Pharmacy consulted for it.  Consultation: - Consulted or discussed management/test interpretation with external professional: Neurosurgery team.  I spoke with Wende Crease - APP with Dr. Johnsie Cancel.  She has reviewed the CT scan.  She recommends that patient be admitted to the hospital and most likely tomorrow afternoon they will proceed with decompression.    Final Clinical Impression(s) / ED Diagnoses Final diagnoses:  Subdural hematoma (HCC)  Right sided weakness    Rx / DC Orders ED Discharge Orders     None          Derwood Kaplan, MD 03/26/23 1344

## 2023-03-26 NOTE — ED Notes (Signed)
Pt transported to CT ?

## 2023-03-26 NOTE — Assessment & Plan Note (Signed)
Patient with known history of A fib on tikosyn 500 mcg and xarelto  - continue home tikosyn  - holding xarelto in setting of subdural hematoma - continue to monitor

## 2023-03-26 NOTE — Progress Notes (Signed)
FMTS Brief Progress Note  S: Saw patient at bedside with Dr. Mliss Sax.  He is alert and talking to [place] very well.  O: BP (!) 151/72 (BP Location: Right Arm)   Pulse 66   Temp 98.6 F (37 C) (Oral)   Resp 18   Ht 5\' 5"  (1.651 m)   Wt 63.1 kg   SpO2 97%   BMI 23.15 kg/m    Gen: Alert, oriented to person, place, situation, year Neuro: CN II: PERRL CN III, IV,VI: EOMI CV V: Normal sensation in V1, V2, V3 CVII: Symmetric smile and brow raise CN VIII: Normal hearing CN IX,X: Symmetric palate raise  CN XI: 5/5 shoulder shrug on left, 4/5 on right CN XII: Symmetric tongue protrusion  UE 5/5 on left, 4/5 right and LE strength 5/5 on left, 0/5 on right (unable to keep right leg elevated on bed against gravity)  Normal sensation in UE and LE bilaterally  Mild ataxia with finger to nose   A/P: Subdural hematoma S/p Andexxa in ED.  Plan for burr holes to decompress.  He is very well-appearing on examination today.  Continues to have right-sided deficits on arm and right leg.  He states that he has always had some weakness in his right leg due to tendon injury from one of his bypass surgeries but that the weakness is much worse than it has been in the past. - Frequent neurologic checks - Orders reviewed. Labs for AM ordered, which was adjusted as needed.  - If condition changes, plan includes repeat head imaging, touch base with neurology/CCM as necessary.   Levin Erp, MD 03/26/2023, 8:25 PM PGY-3, [facility] Night Resident  Please page [phone] with questions.

## 2023-03-26 NOTE — ED Triage Notes (Signed)
Pt here from home today with c/o right leg weakness times 2 weeks , pt has fall back in June and suffered a SDH, was doing ok until 2 weeks ago , is on blood thinners and has had several small falls since

## 2023-03-26 NOTE — Plan of Care (Signed)
  Problem: Activity: Goal: Risk for activity intolerance will decrease Outcome: Not Progressing   Problem: Safety: Goal: Ability to remain free from injury will improve Outcome: Not Progressing   

## 2023-03-26 NOTE — Telephone Encounter (Signed)
I called pt then spoke to wife.  Pt has had symptoms of falling then now R leg/ foot dragging.  R arm weakness, no facial droop or trouble swallowing.  Has to use a walker.  She said with the last 1-2 wks.  Has MRI scheduled at [facility] today.  I recommended to go to [facility] ED for evaluation. They can do Imaging studies there   she can relay that they have appt at [facility] for MRI today. I said could be ? Stroke/bleed.  He is on Xarelto.  We saw him 6/18/ 2024 for head trauma. Wife relayed to pt.  She will let us know.

## 2023-03-27 ENCOUNTER — Other Ambulatory Visit: Payer: Self-pay

## 2023-03-27 ENCOUNTER — Inpatient Hospital Stay (HOSPITAL_COMMUNITY): Payer: Medicare Other | Admitting: Anesthesiology

## 2023-03-27 ENCOUNTER — Encounter (HOSPITAL_COMMUNITY)
Admission: EM | Disposition: A | Discharge status: Home Health | Payer: Self-pay | Source: Home / Self Care | Attending: Family Medicine

## 2023-03-27 ENCOUNTER — Encounter (HOSPITAL_COMMUNITY): Payer: Self-pay | Admitting: Family Medicine

## 2023-03-27 DIAGNOSIS — Z87891 Personal history of nicotine dependence: Secondary | ICD-10-CM

## 2023-03-27 DIAGNOSIS — I480 Paroxysmal atrial fibrillation: Secondary | ICD-10-CM

## 2023-03-27 DIAGNOSIS — I6200 Nontraumatic subdural hemorrhage, unspecified: Secondary | ICD-10-CM | POA: Diagnosis not present

## 2023-03-27 DIAGNOSIS — S065XAA Traumatic subdural hemorrhage with loss of consciousness status unknown, initial encounter: Secondary | ICD-10-CM

## 2023-03-27 DIAGNOSIS — I10 Essential (primary) hypertension: Secondary | ICD-10-CM | POA: Diagnosis not present

## 2023-03-27 DIAGNOSIS — R269 Unspecified abnormalities of gait and mobility: Secondary | ICD-10-CM

## 2023-03-27 DIAGNOSIS — I2510 Atherosclerotic heart disease of native coronary artery without angina pectoris: Secondary | ICD-10-CM | POA: Diagnosis not present

## 2023-03-27 HISTORY — PX: BURR HOLE: SHX908

## 2023-03-27 SURGERY — CREATION, CRANIAL BURR HOLE
Anesthesia: General | Site: Head | Laterality: Left

## 2023-03-27 MED ORDER — ESMOLOL HCL 100 MG/10ML IV SOLN
INTRAVENOUS | Status: DC | PRN
  Administered 2023-03-27: 20 mg via INTRAVENOUS

## 2023-03-27 MED ORDER — LACTATED RINGERS IV SOLN: INTRAVENOUS | Status: DC | PRN

## 2023-03-27 MED ORDER — LACTATED RINGERS IV SOLN: INTRAVENOUS | Status: AC

## 2023-03-27 MED ORDER — LIDOCAINE-EPINEPHRINE 1 %-1:100000 IJ SOLN
INTRAMUSCULAR | Status: DC | PRN
  Administered 2023-03-27: 8 mL via INTRADERMAL

## 2023-03-27 MED ORDER — 0.9 % SODIUM CHLORIDE (POUR BTL) OPTIME
TOPICAL | Status: DC | PRN
  Administered 2023-03-27: 1000 mL

## 2023-03-27 MED ORDER — ROCURONIUM BROMIDE 100 MG/10ML IV SOLN
INTRAVENOUS | Status: DC | PRN
  Administered 2023-03-27: 50 mg via INTRAVENOUS

## 2023-03-27 MED ORDER — FENTANYL CITRATE (PF) 250 MCG/5ML IJ SOLN
INTRAMUSCULAR | Status: AC
  Filled 2023-03-27: qty 5

## 2023-03-27 MED ORDER — FENTANYL CITRATE (PF) 100 MCG/2ML IJ SOLN
INTRAMUSCULAR | Status: DC | PRN
  Administered 2023-03-27: 50 ug via INTRAVENOUS

## 2023-03-27 MED ORDER — ACETAMINOPHEN 10 MG/ML IV SOLN
INTRAVENOUS | Status: AC
  Filled 2023-03-27: qty 100

## 2023-03-27 MED ORDER — SUGAMMADEX SODIUM 200 MG/2ML IV SOLN
INTRAVENOUS | Status: DC | PRN
  Administered 2023-03-27: 200 mg via INTRAVENOUS

## 2023-03-27 MED ORDER — THROMBIN 20000 UNITS EX SOLR
CUTANEOUS | Status: AC
  Filled 2023-03-27: qty 20000

## 2023-03-27 MED ORDER — LIDOCAINE HCL (CARDIAC) PF 50 MG/5ML IV SOSY
PREFILLED_SYRINGE | INTRAVENOUS | Status: DC | PRN
  Administered 2023-03-27: 100 mg via INTRAVENOUS

## 2023-03-27 MED ORDER — BACITRACIN ZINC 500 UNIT/GM EX OINT
TOPICAL_OINTMENT | CUTANEOUS | Status: AC
  Filled 2023-03-27: qty 28.35

## 2023-03-27 MED ORDER — CEFAZOLIN SODIUM-DEXTROSE 2-4 GM/100ML-% IV SOLN
INTRAVENOUS | Status: AC
  Filled 2023-03-27: qty 100

## 2023-03-27 MED ORDER — THROMBIN 5000 UNITS EX SOLR
CUTANEOUS | Status: AC
  Filled 2023-03-27: qty 5000

## 2023-03-27 MED ORDER — THROMBIN 20000 UNITS EX SOLR: CUTANEOUS | Status: DC | PRN

## 2023-03-27 MED ORDER — CEFAZOLIN SODIUM-DEXTROSE 2-3 GM-%(50ML) IV SOLR
INTRAVENOUS | Status: DC | PRN
  Administered 2023-03-27: 2 g via INTRAVENOUS

## 2023-03-27 MED ORDER — PHENYLEPHRINE HCL-NACL 20-0.9 MG/250ML-% IV SOLN
INTRAVENOUS | Status: DC | PRN
  Administered 2023-03-27: 50 ug/min via INTRAVENOUS

## 2023-03-27 MED ORDER — DOFETILIDE 250 MCG PO CAPS
250.0000 ug | ORAL_CAPSULE | Freq: Two times a day (BID) | ORAL | Status: DC
  Administered 2023-03-27 – 2023-03-28 (×2): 250 ug via ORAL
  Filled 2023-03-27 (×4): qty 1

## 2023-03-27 MED ORDER — VASOPRESSIN 20 UNIT/ML IV SOLN
INTRAVENOUS | Status: AC
  Filled 2023-03-27: qty 1

## 2023-03-27 MED ORDER — PROPOFOL 10 MG/ML IV BOLUS
INTRAVENOUS | Status: DC | PRN
Start: 2023-03-27 — End: 2023-03-27
  Administered 2023-03-27: 100 mg via INTRAVENOUS

## 2023-03-27 MED ORDER — METOPROLOL TARTRATE 5 MG/5ML IV SOLN
INTRAVENOUS | Status: DC | PRN
Start: 2023-03-27 — End: 2023-03-27
  Administered 2023-03-27 (×3): .5 mg via INTRAVENOUS

## 2023-03-27 MED ORDER — LIDOCAINE-EPINEPHRINE 1 %-1:100000 IJ SOLN
INTRAMUSCULAR | Status: AC
  Filled 2023-03-27: qty 1

## 2023-03-27 MED ORDER — DEXAMETHASONE SODIUM PHOSPHATE 4 MG/ML IJ SOLN
INTRAMUSCULAR | Status: DC | PRN
  Administered 2023-03-27: 4 mg via INTRAVENOUS

## 2023-03-27 MED ORDER — ACETAMINOPHEN 10 MG/ML IV SOLN
INTRAVENOUS | Status: DC | PRN
  Administered 2023-03-27: 1000 mg via INTRAVENOUS

## 2023-03-27 MED ORDER — THROMBIN 5000 UNITS EX SOLR: OROMUCOSAL | Status: DC | PRN

## 2023-03-27 SURGICAL SUPPLY — 63 items
BAG COUNTER SPONGE SURGICOUNT (BAG) ×1 IMPLANT
BAG SPNG CNTER NS LX DISP (BAG) ×1
BLADE CLIPPER SURG (BLADE) ×1 IMPLANT
BNDG GAUZE DERMACEA FLUFF 4 (GAUZE/BANDAGES/DRESSINGS) IMPLANT
BNDG GZE DERMACEA 4 6PLY (GAUZE/BANDAGES/DRESSINGS)
BUR ACORN 9.0 PRECISION (BURR) ×1 IMPLANT
BUR MATCHSTICK NEURO 3.0 LAGG (BURR) IMPLANT
BUR SPIRAL ROUTER 2.3 (BUR) IMPLANT
CANISTER SUCT 3000ML PPV (MISCELLANEOUS) ×1 IMPLANT
CLIP TI MEDIUM 6 (CLIP) IMPLANT
DRAPE NEUROLOGICAL W/INCISE (DRAPES) ×1 IMPLANT
DRAPE SHEET LG 3/4 BI-LAMINATE (DRAPES) ×1 IMPLANT
DRAPE SURG 17X23 STRL (DRAPES) IMPLANT
DRAPE WARM FLUID 44X44 (DRAPES) ×1 IMPLANT
DURAPREP 6ML APPLICATOR 50/CS (WOUND CARE) ×1 IMPLANT
ELECT REM PT RETURN 9FT ADLT (ELECTROSURGICAL) ×1
ELECTRODE REM PT RTRN 9FT ADLT (ELECTROSURGICAL) ×1 IMPLANT
EVACUATOR 1/8 PVC DRAIN (DRAIN) IMPLANT
EVACUATOR SILICONE 100CC (DRAIN) IMPLANT
GAUZE 4X4 16PLY ~~LOC~~+RFID DBL (SPONGE) IMPLANT
GAUZE SPONGE 4X4 12PLY STRL (GAUZE/BANDAGES/DRESSINGS) ×1 IMPLANT
GLOVE BIO SURGEON STRL SZ7.5 (GLOVE) ×1 IMPLANT
GLOVE BIOGEL PI IND STRL 7.0 (GLOVE) ×1 IMPLANT
GLOVE BIOGEL PI IND STRL 7.5 (GLOVE) ×2 IMPLANT
GLOVE ECLIPSE 7.5 STRL STRAW (GLOVE) ×2 IMPLANT
GLOVE EXAM NITRILE LRG STRL (GLOVE) IMPLANT
GLOVE EXAM NITRILE XL STR (GLOVE) IMPLANT
GLOVE EXAM NITRILE XS STR PU (GLOVE) IMPLANT
GOWN STRL REUS W/ TWL LRG LVL3 (GOWN DISPOSABLE) ×2 IMPLANT
GOWN STRL REUS W/ TWL XL LVL3 (GOWN DISPOSABLE) IMPLANT
GOWN STRL REUS W/TWL 2XL LVL3 (GOWN DISPOSABLE) IMPLANT
GOWN STRL REUS W/TWL LRG LVL3 (GOWN DISPOSABLE) ×2
GOWN STRL REUS W/TWL XL LVL3 (GOWN DISPOSABLE)
HEMOSTAT POWDER KIT SURGIFOAM (HEMOSTASIS) ×1 IMPLANT
HEMOSTAT SURGICEL 2X14 (HEMOSTASIS) IMPLANT
HOOK DURA 1/2IN (MISCELLANEOUS) ×1 IMPLANT
KIT BASIN OR (CUSTOM PROCEDURE TRAY) ×1 IMPLANT
KIT TURNOVER KIT B (KITS) ×1 IMPLANT
NDL HYPO 22X1.5 SAFETY MO (MISCELLANEOUS) ×1 IMPLANT
NEEDLE HYPO 22X1.5 SAFETY MO (MISCELLANEOUS) ×1 IMPLANT
NS IRRIG 1000ML POUR BTL (IV SOLUTION) ×1 IMPLANT
PACK CRANIOTOMY CUSTOM (CUSTOM PROCEDURE TRAY) ×1 IMPLANT
PATTIES SURGICAL .5 X.5 (GAUZE/BANDAGES/DRESSINGS) IMPLANT
PATTIES SURGICAL .5 X3 (DISPOSABLE) IMPLANT
PATTIES SURGICAL 1X1 (DISPOSABLE) IMPLANT
SPONGE NEURO XRAY DETECT 1X3 (DISPOSABLE) IMPLANT
SPONGE SURGIFOAM ABS GEL SZ50 (HEMOSTASIS) ×1 IMPLANT
STAPLER VISISTAT 35W (STAPLE) ×1 IMPLANT
STOCKINETTE 6 STRL (DRAPES) IMPLANT
SUT ETHILON 3 0 FSL (SUTURE) IMPLANT
SUT ETHILON 3 0 PS 1 (SUTURE) IMPLANT
SUT MNCRL AB 3-0 PS2 18 (SUTURE) ×1 IMPLANT
SUT NURALON 4 0 TR CR/8 (SUTURE) ×1 IMPLANT
SUT STEEL 0 (SUTURE)
SUT STEEL 0 18XMFL TIE 17 (SUTURE) IMPLANT
SUT VIC AB 0 CT1 18XCR BRD8 (SUTURE) ×1 IMPLANT
SUT VIC AB 0 CT1 8-18 (SUTURE) ×1
TOWEL GREEN STERILE (TOWEL DISPOSABLE) ×1 IMPLANT
TOWEL GREEN STERILE FF (TOWEL DISPOSABLE) ×1 IMPLANT
TRAY FOLEY MTR SLVR 16FR STAT (SET/KITS/TRAYS/PACK) ×1 IMPLANT
TUBE CONNECTING 12X1/4 (SUCTIONS) ×1 IMPLANT
UNDERPAD 30X36 HEAVY ABSORB (UNDERPADS AND DIAPERS) ×1 IMPLANT
WATER STERILE IRR 1000ML POUR (IV SOLUTION) ×1 IMPLANT

## 2023-03-27 NOTE — Hospital Course (Signed)
[name] is a 77 y.o.male with a history of paroxysmal A-fib on anticoagulation with Xarelto, CAD s/p CABG x 5, COPD, HTN, AAA noted 05/2020, anxiety who was admitted to the [facility] Medicine Teaching Service at [facility] for progressive weakness and multiple falls, with head CT demonstrating a large left-sided subdural hematoma requiring Xarelto washout head of burr holes with neurosurgery.  His hospital course is detailed below:  Subdural hematoma Patient was s/p fall on 6/5 when he presented to the ED and was found to have stable and unchanged subdural hematoma not requiring intervention at the time.  His Xarelto was held for 1 week and subsequently restarted.  He then began to have progressive weakness and multiple falls and so presented again to the ED on 7/29 when he was found to have a large left-sided subdural hematoma on CT head.  Of note his last Xarelto dose was in the evening of 7/28.  Patient received Andexxa in the ED to reverse Xarelto. Neurosurgery was consulted and recommended surgical decompression.  Patient ultimately underwent burr hole procedure late in the evening on 7/30 and was admitted to the ICU overnight for monitoring. Patient was cleared for discharge by neurosurgery with follow up in 2 weeks outpatient and Xarelto will need to be held until after follow up.   Atrial fibrillation Xarelto held in the setting of acute bleed and reversed with Andexxa in the ED. Patient was continued on Tikosyn 500 mcg daily. It was noted in the evening of 7/30 prior to patient's burr hole procedure that he was in atrial fibrillation with RVR, rates in the 140s but not sustained. He was otherwise hemodynamically stable. Cardiology recommended metoprolol tartrate 12.5 mg if needed. Ultimately this did not interfere with patient surgical procedure.  Patient remained stable throughout the rest of hospitalization.   Other chronic conditions were medically managed with home medications and formulary  alternatives as necessary.  CAD s/p CABGx5: lipitor 40mg , amlodipine 5 mg  COPD: does not require any inhalers or home oxygen, remained stable on room air throughout hospitalization HTN: Lisinopril 20 mg  Anxiety: xanax 0.25 mg at bedtime    PCP Follow-up Recommendations: Consider not restarting Xarelto in the setting of multiple falls, and now two subdural hematomas. Consider discontinuing Xanax due to falls Follow-up with outpatient cardiologist regarding atrial fibrillation and anticoagulation Close follow-up with neurosurgery/neurology as indicated Consider discontinuing Xanax in the setting of falls

## 2023-03-27 NOTE — Progress Notes (Addendum)
FMTS Brief Progress Note  S:  Went to bedside with Dr. Mliss Sax to assess patient prior to surgery. He is doing well except would like to know whether surgery officially scheduled. Family in room as well-they would like me to touch base with surgery about updates.  8:28 - messaged Dr. Maurice Small (Neurosurgery) in secure chat who states surgery to be at 10 PM  8:51 - paged by patient's primary RN that patient's rates are from the 120s-140s in Afib RVR-we called nurse back and discussed to please give tikosyn (was due at 8 PM)  9:00 - Reviewed telemetry - rates intermittently to 140s starting at 8 PM. Spoke with family again that I will touch base with Cardiology and Neurosurgery about this.  9:09 - Called on call cardiology fellow Dr. Geraldo Pitter- discussed rates and patient's situation. Last Echo with EF 40-45%. He has plan for surgery today. Does take a few hours for tikosyn and not great for rates. Did not recommend amio/dilt drip prior to surgery. Recommended metoprolol tartrate 12.5 mg as needed for rates given low EF. He recommended that I speak with surgeon about their comfort with this and they will communicate with anesthesiology about rate control needed during surgery.  9:15 - Messaged surgeon  9:18 - Called OR who provided surgeon's number - spoke with Dr. Maurice Small about patient's clinical situation and cardiology recommendations. He recommended I speak with anesthesiology about this  9:20 - Spoke with CRNA on patient's case - discussed cardiology recommendations of lopressor and made them aware  9:26 - messaged primary RN about patient updates   O: BP (!) 148/84 (BP Location: Left Arm)   Pulse (!) 128   Temp 98.1 F (36.7 C) (Oral)   Resp 19   Ht 5\' 5"  (1.651 m)   Wt 63.1 kg   SpO2 91%   BMI 23.15 kg/m    Gen: NAD, awake, alert, conversant. Family in room Resp: bilateral chest rise, no iWOB CV: Rates tachycardic 120s-130s Neuro: CN II: PERRL CN III, IV,VI: EOMI CV V:  Normal sensation in V1, V2, V3 CVII: Symmetric smile and brow raise CN VIII: Normal hearing CN IX,X: Symmetric palate raise  CN XI: 5/5 shoulder shrug on left, 4/5 right CN XII: Symmetric tongue protrusion  UE strength 4/5 right, 5/5 left Normal sensation in UE and LE bilaterally    A/P:  Afib RVR Received Tikosyn Rates in 120s-140s.  Communicated with surgeon HDS at these rates-communicated with anesthesia as well. Metoprolol tartrate 12.5 mg to be used for rate control as needed  SDH Neuro exam stable To neuro ICU after surgery  Levin Erp, MD 03/27/2023, 9:08 PM PGY-3, [facility] [facility] Night Resident  Please page [phone] with questions.

## 2023-03-27 NOTE — Progress Notes (Signed)
PT Cancellation Note  Patient Details Name: [name] MRN: [id number] DOB: 06/07/1946   Cancelled Treatment:    Reason Eval/Treat Not Completed: Other (comment) (Patient anticipated to have Burr hole procedure for evacuation of SDH this afternoon. PT will hold and follow up next date as appropriate.)  Donna Bernard, PT, MPT  Ina Homes 03/27/2023, 12:46 PM

## 2023-03-27 NOTE — Progress Notes (Signed)
Neurosurgery Service Post-operative progress note  Assessment & Plan: 77 y.o. man s/p L burr hole evac of SDH.  -admit to 4N ICU overnight -diet and activity as tolerated -CT head w/o contrast ordered to eval for any residual -SBP<180  Jadene Pierini  03/27/23 11:01 PM

## 2023-03-27 NOTE — Anesthesia Procedure Notes (Signed)
Procedure Name: Intubation Date/Time: 03/27/2023 10:25 PM  Performed by: Edmonia Caprio, CRNAPre-anesthesia Checklist: Patient identified, Emergency Drugs available, Suction available, Patient being monitored and Timeout performed Patient Re-evaluated:Patient Re-evaluated prior to induction Oxygen Delivery Method: Circle system utilized Preoxygenation: Pre-oxygenation with 100% oxygen Induction Type: IV induction Ventilation: Mask ventilation without difficulty Laryngoscope Size: Miller and 2 Grade View: Grade I Tube type: Oral Tube size: 7.0 mm Number of attempts: 1 Airway Equipment and Method: Stylet Placement Confirmation: positive ETCO2, ETT inserted through vocal cords under direct vision and breath sounds checked- equal and bilateral Secured at: 23 cm Tube secured with: Tape Dental Injury: Teeth and Oropharynx as per pre-operative assessment

## 2023-03-27 NOTE — Progress Notes (Signed)
MEDICATION RELATED NOTE    77 yo M on Tikosyn since 2017 and followed in the afib clinic.  PTA Tikosyn dose is BID.  Noted to have CrCl ~ 46 (using TBW) and has been stable for some time.  Reviewed afib clinic notes and reached out to [facility] [place] re: dose reduction as recommended for CrCl 40-60.  Will place order to reduce dose to BID per his recommendation.  [employer], Pharm.D., BCPS Clinical Pharmacist Clinical phone for 03/27/2023 from 7:30-3:00 is [phone].  **Pharmacist phone directory can be found on [email or web address] listed under [facility].  03/27/2023 2:01 PM

## 2023-03-27 NOTE — Assessment & Plan Note (Signed)
Patient with known history of A fib on tikosyn 500 mcg and xarelto  - continue home tikosyn  - in the setting of SDH, will d/c xarelto and update o/p cardiologist regarding change.  - continue to monitor

## 2023-03-27 NOTE — Anesthesia Preprocedure Evaluation (Addendum)
Anesthesia Evaluation  Patient identified by MRN, date of birth, ID band Patient awake    Reviewed: Allergy & Precautions, NPO status , Patient's Chart, lab work & pertinent test results  Airway Mallampati: II  TM Distance: >3 FB Neck ROM: Full    Dental  (+) Edentulous Upper, Partial Lower, Dental Advisory Given   Pulmonary COPD, former smoker   breath sounds clear to auscultation       Cardiovascular hypertension, Pt. on medications + CAD and + CABG  + dysrhythmias Atrial Fibrillation  Rhythm:Irregular Rate:Tachycardia  Echo: - Left ventricle: The cavity size was normal. Wall thickness was    normal. Systolic function was mildly reduced. The estimated    ejection fraction was in the range of 45% to 50%. Abnormal GLPSS    at -13% with inferoseptal strain abnormality. The study is not    technically sufficient to allow evaluation of LV diastolic    function.  - Mitral valve: Mildly thickened leaflets . There was trivial    regurgitation.  - Left atrium: Severely dilated at 51 ml/m2.  - Right ventricle: The cavity size was mildly dilated.  - Right atrium: The atrium was mildly dilated.  - Tricuspid valve: There was trivial regurgitation.  - Pulmonary arteries: PA peak pressure: 21 mm Hg (S).  - Inferior vena cava: The vessel was normal in size. The    respirophasic diameter changes were in the normal range (>= 50%),    consistent with normal central venous pressure.     Neuro/Psych  Headaches  Anxiety        GI/Hepatic negative GI ROS, Neg liver ROS,,,  Endo/Other  negative endocrine ROS    Renal/GU negative Renal ROS     Musculoskeletal negative musculoskeletal ROS (+)    Abdominal   Peds  Hematology negative hematology ROS (+)   Anesthesia Other Findings   Reproductive/Obstetrics                             Anesthesia Physical Anesthesia Plan  ASA: 3 and emergent  Anesthesia  Plan: General   Post-op Pain Management: Ofirmev IV (intra-op)*   Induction: Intravenous  PONV Risk Score and Plan: 3 and Ondansetron, Dexamethasone and Midazolam  Airway Management Planned: Oral ETT  Additional Equipment: None  Intra-op Plan:   Post-operative Plan: Extubation in OR  Informed Consent: I have reviewed the patients History and Physical, chart, labs and discussed the procedure including the risks, benefits and alternatives for the proposed anesthesia with the patient or authorized representative who has indicated his/her understanding and acceptance.     Dental advisory given  Plan Discussed with: CRNA  Anesthesia Plan Comments:        Anesthesia Quick Evaluation

## 2023-03-27 NOTE — Progress Notes (Signed)
OT Cancellation Note  Patient Details Name: [name] MRN: [id number] DOB: 1945-12-28   Cancelled Treatment:    Reason Eval/Treat Not Completed: Other (comment) Plan for burr hole evacuation in OR tonight. Will hold OT attempts today and follow up post op.  Lorre Munroe 03/27/2023, 2:10 PM

## 2023-03-27 NOTE — Progress Notes (Signed)
FMTS Brief Progress Note  S: Patient seen at bedside for 2AM neuro check. His wife is present. Pateint was sleeping but awoke easily to verbal stimuli.   O: BP (!) 170/82   Pulse 68   Temp 98.9 F (37.2 C) (Oral)   Resp 18   Ht 5\' 5"  (1.651 m)   Wt 63.1 kg   SpO2 97%   BMI 23.15 kg/m   General: Initially sleeping, but awakes easily and is alert, pleasant Neuro: CN II: PERRL CN III, IV,VI: EOMI CV V: Normal sensation in V1, V2, V3 CVII: Symmetric smile and brow raise CN VIII: Normal hearing CN IX,X: Symmetric palate raise  CN XI: 4/5 shoulder shrug on right side, 5/5 on left UE strength 4/5 on right, 5/5 on left LE strength 0/5 on right (cannot elevate leg off of bed against gravity), 5/5 in left Normal sensation in UE and LE bilaterally   A/P: Subdural hematoma Nursing unable to perform neuro checks q2h due to patient load, but RN will perform next neuro check at 4AM. Patient exam is consistent with last neuro exam performed by Dr. Laroy Apple. No significant changes appreciated. - Continue frequent neuro checks, coordinated with RN - Orders reviewed. Labs for AM ordered. - If condition changes, plan includes reevaluation, repeat head imaging, consult neurology/CCM if indicated.   Cyndia Skeeters, DO 03/27/2023, 2:08 AM PGY-1, [facility] Night Resident  Please page [phone] with questions.

## 2023-03-27 NOTE — Consult Note (Signed)
Neurosurgery Consultation  Reason for Consult: Subdural hematoma Referring Physician: Manson Passey  CC: Right sided weakness  HPI: This is a 77 y.o. man on rivaroxaban for PAF that had a fall 6 weeks ago that now presents with 2 weeks of progressive RLE > RUE weakness. No aura or episodic weakness. Rivaroxaban was reversed and held.    ROS: A 14 point ROS was performed and is negative except as noted in the HPI.   PMHx:  Past Medical History:  Diagnosis Date   AAA (abdominal aortic aneurysm) (HCC)    06/27/20 MRI: 3.0 cm infrarenal AAA, recommend 3 year follow-up   Anxiety    Atrial fibrillation (HCC)    Bilateral carotid artery disease (HCC) 07/21/2013   CAD (coronary artery disease) 2008   CABG   COPD (chronic obstructive pulmonary disease) (HCC)    Dysrhythmia    A-fib   Gastritis    Headache(784.0)    Hypertension    Visit for monitoring Tikosyn therapy 12/2015   FamHx:  Family History  Problem Relation Age of Onset   Diabetes Mother    Hypertension Mother    Heart attack Father    Sudden death Father    Hypertension Sister    Diabetes Sister    SocHx:  reports that he has quit smoking. He has quit using smokeless tobacco.  His smokeless tobacco use included chew. He reports that he does not drink alcohol and does not use drugs.  Exam: Vital signs in last 24 hours: Temp:  [97.9 F (36.6 C)-98.9 F (37.2 C)] 97.9 F (36.6 C) (07/30 1106) Pulse Rate:  [59-76] 59 (07/30 1106) Resp:  [15-18] 15 (07/30 1106) BP: (121-170)/(68-89) 121/68 (07/30 1106) SpO2:  [94 %-100 %] 94 % (07/30 1106) Weight:  [63.1 kg] 63.1 kg (07/29 2000) General: Awake, alert, cooperative, lying in bed in NAD Head: Normocephalic HEENT: Neck supple Pulmonary: breathing room air comfortably, no evidence of increased work of breathing Cardiac: RRR Abdomen: S NT ND Extremities: Warm and well perfused x4 Neuro: AOx3, PERRL, EOMI, FS Strength 5/5 on L, 4/5 in RLE and 4 to 4+/5 in RUE, SILTx4, +RUE  drift, no hoffman's   Assessment and Plan: 77 y.o. man s/p fall with progressive R sided weakness. CTH personally reviewed, which shows a subacute left subdural hematoma at the convexity with brain compression / mass effect.   -OR tonight for burr hole evacuation -4N ICU post-op overnight then should be able to return to the floor POD1  Jadene Pierini, MD 03/27/23 1:04 PM [facility]

## 2023-03-27 NOTE — Op Note (Signed)
PATIENT: [name]  DAY OF SURGERY: 03/27/23   PRE-OPERATIVE DIAGNOSIS:  Subdural hematoma   POST-OPERATIVE DIAGNOSIS:  Same   PROCEDURE:  Left burr hole evacuation of subdural hematoma   SURGEON:  Surgeon(s) and Role:    Jadene Pierini, MD   ANESTHESIA: ETGA   BRIEF HISTORY: This is a 77 year old man who presented with progressive right sided weakness and loss of ambulation due to an expanding subacute subdural hematoma. I therefore recommended burr hole evacuation. This was discussed with the patient as well as risks, benefits, and alternatives and wished to proceed with surgery.   OPERATIVE DETAIL: The patient was taken to the operating room, anesthesia was induced by the anesthesia team, and the patient was placed on the OR table in the supine position. A formal time out was performed with two patient identifiers and confirmed the operative site. The operative site was marked, hair was clipped with surgical clippers, the area was then prepped and draped in a sterile fashion. A linear incision was placed in the left posterior frontoparietal region, dissection was performed, retractor placed, high speed drill used to create a burr hole craniostomy, dura coagulated and opened with immediate egress of dark blood products under high pressure. This was evacuated then copiously irrigated until it returned clear. There were some dark membranes that I coagulated on the cortical surface after elevating them, which I also irrigated under with clear return. Superficial hemostasis was confirmed, all instrument and sponge counts were correct, gelfoam with thrombin was placed in the burr hole, the incision was then closed in layers. The patient was then returned to anesthesia for emergence. No apparent complications at the completion of the procedure.   EBL:  20mL   DRAINS: none   SPECIMENS: none   Jadene Pierini, MD

## 2023-03-27 NOTE — Assessment & Plan Note (Signed)
Patient stable on exam this morning. Neuro exam mostly unchanged from yesterday- some increased weakness of R lower extremity.  - Plan for OR decompression today, patient NPO  - PT/OT to treat - holding xarelto in setting of SDH - AM CBC/BMP  - Fall precautions

## 2023-03-27 NOTE — Transfer of Care (Addendum)
Immediate Anesthesia Transfer of Care Note  Patient: [name]  Procedure(s) Performed: Guss Bunde EVASCUATION OF SUBDURAL HEMATOMA (Left: Head)  Patient Location: PACU  Anesthesia Type:General  Level of Consciousness: awake, alert , and oriented  Airway & Oxygen Therapy: Patient Spontanous Breathing  Post-op Assessment: Report given to RN and Post -op Vital signs reviewed and stable.  Patient spontaneously converted to NSR on arrival to PACU.    Post vital signs: Reviewed and stable  Last Vitals:  Vitals Value Taken Time  BP 125/70 03/27/23 2316  Temp    Pulse 67 03/27/23 2318  Resp 17 03/27/23 2318  SpO2 99 % 03/27/23 2318  Vitals shown include unfiled device data.  Last Pain:  Vitals:   03/27/23 2132  TempSrc: Axillary  PainSc:          Complications: No notable events documented.

## 2023-03-27 NOTE — Assessment & Plan Note (Addendum)
Patient with known history of A fib on tikosyn 500 mcg and xarelto  - continue home tikosyn  - in the setting of SDH, will d/c xarelto and update o/p cardiologist regarding change.  - continue to monitor

## 2023-03-27 NOTE — Progress Notes (Signed)
     Daily Progress Note Intern Pager: [phone]  Patient name: [name] Medical record number: [id number] Date of birth: Feb 22, 1946 Age: 77 y.o. Gender: male  Primary Care Provider: Benita Stabile, MD Consultants: Neurosurgery  Code Status: Full   Pt Overview and Major Events to Date:  7/29- admitted   Assessment and Plan: [name] is a 77 y.o. male presenting with progressive R sided weakness found to have large left-sided SDH requiring decompression.   -      Hospital     * (Principal) Subdural hematoma (HCC)     Patient stable on exam this morning. Neuro exam mostly unchanged from  yesterday- some increased weakness of R lower extremity.  - Plan for OR decompression today, patient NPO  - PT/OT to treat - holding xarelto in setting of SDH - AM CBC/BMP  - Fall precautions         Paroxysmal atrial fibrillation [facility])     Patient with known history of A fib on tikosyn 500 mcg and xarelto  - continue home tikosyn  - in the setting of SDH, will d/c xarelto and update o/p cardiologist  regarding change.  - continue to monitor         Abnormal gait    FEN/GI: NPO pending surgery  PPx: SCDs Dispo:Home pending clinical improvement after surgery   Subjective:  Patient reports he is doing well today. He states he is not nervous for his surgery today. He reports he feels the right sided deficit is about the same as yesterday. He has no other   Objective: Temp:  [97.9 F (36.6 C)-98.9 F (37.2 C)] 97.9 F (36.6 C) (07/30 1106) Pulse Rate:  [59-76] 59 (07/30 1106) Resp:  [15-18] 15 (07/30 1106) BP: (121-170)/(68-89) 121/68 (07/30 1106) SpO2:  [94 %-100 %] 94 % (07/30 1106) Weight:  [63.1 kg] 63.1 kg (07/29 2000) Physical Exam: General: well appearing, pleasant, comfortable  Cardiovascular: irregularly irregular, no m/r/g Respiratory: normal WOB, CTAB Abdomen: soft, non tender, nondistended  Extremities: no peripheral edema noted Neuro:  CN II: PERRLA CN  III, IV,VI: EOMI CV V: Normal sensation in V1, V2, V3 CVII: Symmetric smile and brow raise CN VIII: Normal hearing CN IX,X: Symmetric palate raise  CN XI: 5/5 shoulder shrug on left, 4/5 on right UE 5/5 on left, 4/5 right and LE strength 5/5 on left, 4/5 on the RLE Normal sensation in UE and LE bilaterally   Laboratory: Most recent CBC Lab Results  Component Value Date   WBC 11.6 (H) 03/27/2023   HGB 13.9 03/27/2023   HCT 42.8 03/27/2023   MCV 93.4 03/27/2023   PLT 272 03/27/2023   Most recent BMP    Latest Ref Rng & Units 03/27/2023    3:54 AM  BMP  Glucose 70 - 99 mg/dL 83   BUN 8 - 23 mg/dL 14   Creatinine 4.03 - 1.24 mg/dL 4.74   Sodium 259 - 563 mmol/L 136   Potassium 3.5 - 5.1 mmol/L 4.0   Chloride 98 - 111 mmol/L 102   CO2 22 - 32 mmol/L 21   Calcium 8.9 - 10.3 mg/dL 8.9      Imaging/Diagnostic Tests: No new imaging  Penne Lash, MD 03/27/2023, 1:05 PM  PGY-1, [facility] FPTS Intern pager: [phone], text pages welcome Secure chat group [facility] Teaching Service

## 2023-03-28 ENCOUNTER — Encounter (HOSPITAL_COMMUNITY): Payer: Self-pay | Admitting: Neurological Surgery

## 2023-03-28 ENCOUNTER — Other Ambulatory Visit (HOSPITAL_COMMUNITY): Payer: Self-pay

## 2023-03-28 ENCOUNTER — Inpatient Hospital Stay (HOSPITAL_COMMUNITY): Payer: Medicare Other

## 2023-03-28 MED ORDER — SODIUM CHLORIDE 0.9 % IV BOLUS
1000.0000 mL | Freq: Once | INTRAVENOUS | Status: AC
  Administered 2023-03-28: 1000 mL via INTRAVENOUS

## 2023-03-28 MED ORDER — CHLORHEXIDINE GLUCONATE CLOTH 2 % EX PADS
6.0000 | MEDICATED_PAD | Freq: Once | CUTANEOUS | Status: AC
  Administered 2023-03-28: 6 via TOPICAL

## 2023-03-28 MED ORDER — CHLORHEXIDINE GLUCONATE CLOTH 2 % EX PADS: 6.0000 | MEDICATED_PAD | Freq: Every day | CUTANEOUS | Status: DC

## 2023-03-28 MED ORDER — DOFETILIDE 250 MCG PO CAPS
250.0000 ug | ORAL_CAPSULE | Freq: Two times a day (BID) | ORAL | 0 refills | Status: DC
  Filled 2023-03-28: qty 60, 30d supply, fill #0

## 2023-03-28 NOTE — Assessment & Plan Note (Signed)
S/p burr hole decompression overnight. He reports feeling much more like himself.  - on PE, patient has increased strength and dexterity on R extremities, looks very well   - PT/OT to treat - holding xarelto in setting of SDH - PM CBC  - Fall precautions

## 2023-03-28 NOTE — Discharge Summary (Addendum)
Family Medicine Teaching [facility] Discharge Summary  Patient name: [name] Medical record number: [id number] Date of birth: October 28, 1945 Age: 77 y.o. Gender: male Date of Admission: 03/26/2023  Date of Discharge: 03/28/23  Admitting Physician: Penne Lash, MD  Primary Care Provider: Benita Stabile, MD Consultants: Neurosurgery   Indication for Hospitalization: Found to have SDH requiring decompensation   Brief Hospital Course:  [name] is a 77 y.o.male with a history of paroxysmal A-fib on anticoagulation with Xarelto, CAD s/p CABG x 5, COPD, HTN, AAA noted 05/2020, anxiety who was admitted to the [facility] Medicine Teaching Service at [facility] for progressive weakness and multiple falls, with head CT demonstrating a large left-sided subdural hematoma requiring Xarelto reversal  ahead of burr holes with neurosurgery.  His hospital course is detailed below:  Subdural hematoma Patient was s/p prior hospitalization after a fall on 6/5 when he had a small subdural hematoma not requiring intervention at the time.  His Xarelto was held for 1 week and subsequently restarted.  He then began to have progressive weakness and multiple falls and so presented again to the [facility] on 7/29 when he was found to have a large left-sided subdural hematoma on CT head.  Patient received Andexxa in the ED to reverse Xarelto. Neurosurgery was consulted and recommended surgical decompression.  Patient ultimately underwent burr hole procedure late in the evening on 7/30 and was admitted to the ICU overnight for monitoring. Patient was discharged in stable condition on POD #1 with instructions to discontinue his Xarelto.   Atrial fibrillation Xarelto held in the setting of acute bleed and reversed with Andexxa in the ED. Patient had a brief run of RVR the evening of 7/30 which terminated after administration of his home dofetilide. Of note, his dofetilide dose was adjusted while in the hospital from 500 mcg  BID to 250 mcg BID based upon his Creatinine clearance. This was discussed with electrophysiology and he will have follow-up in the A Fib clinic.    PCP Follow-up Recommendations: Recommend discontinuation of his Xarelto. He may be a candidate for a Watchman device. A Fib clinic follow-up advised.  Consider discontinuing Xanax due to falls He will have follow-up with neurosurgery in 2 weeks.    Discharge Diagnoses/Problem List:  Principal Problem:   Subdural hematoma (HCC) Active Problems:   Paroxysmal atrial fibrillation (HCC)   Abnormal gait   Disposition: Home  Discharge Condition: Stable  Discharge Exam:   Physical Exam Constitutional:      Appearance: Normal appearance.  HENT:     Head:     Comments: Surgical side to L scalp. Sutures in place without any bleeding, swelling, or erythema. Cardiovascular:     Rate and Rhythm: Normal rate and regular rhythm.  Pulmonary:     Effort: Pulmonary effort is normal.     Breath sounds: Normal breath sounds.  Abdominal:     General: Abdomen is flat. Bowel sounds are normal.     Palpations: Abdomen is soft.  Neurological:     Mental Status: He is alert.     Comments: Speech is fluent and he is mentating well.  Neuro exam unchanged from prior EXCEPT for following  Increased strength 5/5 on R shoulder shrug, upper and lower R extremities  Normal sensation in UE and LE bilaterally       Significant Procedures:  Burr hole evacuation of SDH with Dr. Maurice Small 7/30  Significant Labs and Imaging:  Recent Labs  Lab 03/27/23 0354 03/28/23  0426  WBC 11.6* 10.2  HGB 13.9 12.0*  HCT 42.8 37.7*  PLT 272 258   Recent Labs  Lab 03/27/23 0354 03/28/23 0426  NA 136 130*  K 4.0 3.8  CL 102 99  CO2 21* 20*  GLUCOSE 83 258*  BUN 14 19  CREATININE 1.17 1.32*  CALCIUM 8.9 8.2*  MG  --  1.9   CT Head on arrival  FINDINGS: Brain: There is a 2.6 cm left cerebral convexity acute to subacute subdural hematoma. There is 7 mm  rightward midline shift. No CT evidence of an acute cortical infarct. No evidence of subarachnoid hemorrhage. No evidence of intraventricular blood products. There is mass effect on the left lateral ventricular system.   Vascular: No hyperdense vessel or unexpected calcification.   Skull: Normal. Negative for fracture or focal lesion.   Sinuses/Orbits: No middle ear or mastoid effusion. Paranasal sinuses are clear. Orbits are unremarkable.   IMPRESSION: 2.6 cm left cerebral convexity acute to subacute subdural hematoma with 7 mm rightward midline shift.  CT Head post-operatively  FINDINGS: Brain: Interval burr hole evacuation of subdural hematoma on the left which has decreased in volume. Maximal thickness is currently 16 mm. Midline shift is improved, now measuring up to 5-6 mm. No indication of rebleeding. No complicating infarct. There is generalized atrophy with periventricular chronic small vessel ischemia.   Vascular: No hyperdense vessel or unexpected calcification.   Skull: Unremarkable high left parietal burr hole.   Sinuses/Orbits: No acute finding.   Other: None.   IMPRESSION: Interval decompression of subdural hematoma on the left with improved mass effect. Midline shift now measures 5-6 mm.    Results/Tests Pending at Time of Discharge: none  Discharge Medications:  Allergies as of 03/28/2023       Reactions   Doxycycline Shortness Of Breath   Also has to urinate more often   Nsaids Hypertension   Gastritis         Medication List     STOP taking these medications    rivaroxaban 20 MG Tabs tablet Commonly known as: Xarelto       TAKE these medications    acetaminophen 500 MG tablet Commonly known as: TYLENOL Take 1,000 mg by mouth every 6 (six) hours as needed for moderate pain.   ALPRAZolam 0.25 MG tablet Commonly known as: XANAX TAKE 1 TABLET(0.25 MG) BY MOUTH DAILY What changed: See the new instructions.   amLODipine 5 MG  tablet Commonly known as: NORVASC Take 1 tablet (5 mg total) by mouth daily.   atorvastatin 40 MG tablet Commonly known as: LIPITOR TAKE 1 TABLET(40 MG) BY MOUTH DAILY What changed:  how much to take how to take this when to take this additional instructions   diltiazem 30 MG tablet Commonly known as: Cardizem Take 1 tablet every 4 hours AS NEEDED for heart rate >100 as long as blood pressure >100. What changed:  how much to take how to take this when to take this   dofetilide 250 MCG capsule Commonly known as: TIKOSYN Take 1 capsule (250 mcg total) by mouth 2 (two) times daily. What changed:  medication strength how much to take how to take this when to take this additional instructions   Fish Oil 1000 MG Caps Take 1,000 mg by mouth daily.   lisinopril 20 MG tablet Commonly known as: ZESTRIL TAKE 1 TABLET(20 MG) BY MOUTH DAILY What changed:  how much to take how to take this when to take this  multivitamin with minerals tablet Take 1 tablet by mouth at bedtime.   nitroGLYCERIN 0.4 MG SL tablet Commonly known as: NITROSTAT Place 1 tablet (0.4 mg total) under the tongue every 5 (five) minutes as needed for chest pain.   potassium chloride 10 MEQ tablet Commonly known as: KLOR-CON TAKE 1 TABLET(10 MEQ) BY MOUTH DAILY What changed:  how much to take how to take this when to take this additional instructions   Vitamin C 500 MG Chew Chew 500 mg by mouth daily.        Discharge Instructions: Please refer to Patient Instructions section of EMR for full details.  Patient was counseled important signs and symptoms that should prompt return to medical care, changes in medications, dietary instructions, activity restrictions, and follow up appointments.   Follow-Up Appointments:  Follow-up Information     Benita Stabile, MD. Schedule an appointment as soon as possible for a visit in 1 week(s).   Specialty: Internal Medicine Contact information: [address] [place] [address] [phone]         Jadene Pierini, MD. Call.   Specialty: Neurosurgery Why: Please see Dr. Maurice Small in 2 weeks for follow-up Contact information: [address] [place] [address] [phone]         Jake Bathe, MD. Schedule an appointment as soon as possible for a visit in 2 day(s).   Specialty: Cardiology Contact information: [address] [place] [address] [phone]                 Penne Lash, MD 03/28/2023, 12:17 PM PGY-1, [facility] Family Medicine   I have evaluated this patient along with Dr. Georg Ruddle and reviewed the above note, making necessary revisions.  Dorothyann Gibbs, MD 03/28/2023, 12:17 PM PGY-3, [facility] Family Medicine

## 2023-03-28 NOTE — Evaluation (Signed)
Occupational Therapy Evaluation Patient Details Name: [name] MRN: [id number] DOB: 21-Nov-1945 Today's Date: 03/28/2023   History of Present Illness Pt is a 77 y.o. M who presents 03/26/2023 with progressive R sided weakness and found to have large left sided SDH now s/p L burr hole evacuation of subdural hematoma. Significant PMH: AAA, atrial fibrillation, CAD s/p CABG.   Clinical Impression   PT admitted with L SDH s/p L burr hole evacuation. Pt currently with functional limitiations due to the deficits listed below (see OT problem list). Pt at baseline driving and indep working as an Investment banker, operational. Pt at this time demonstrates decrease awareness of deficits and fall risk with RW. Pt educated on safety and avoiding excessive sweating/ dirt while incision heals for next 2 weeks. Pt advised to allow his crew to do any manual labor due to risk of infection. Pt agreeable at the end of session.  Pt will benefit from skilled OT to increase their independence and safety with adls and balance to allow discharge outpatient follow up .       Recommendations for follow up therapy are one component of a multi-disciplinary discharge planning process, led by the attending physician.  Recommendations may be updated based on patient status, additional functional criteria and insurance authorization.   Assistance Recommended at Discharge Intermittent Supervision/Assistance  Patient can return home with the following A little help with bathing/dressing/bathroom;Assistance with cooking/housework;Assist for transportation    Functional Status Assessment  Patient has had a recent decline in their functional status and demonstrates the ability to make significant improvements in function in a reasonable and predictable amount of time.  Equipment Recommendations  None recommended by OT    Recommendations for Other Services Speech consult     Precautions / Restrictions Precautions Precautions:  Fall Restrictions Weight Bearing Restrictions: No      Mobility Bed Mobility               General bed mobility comments: oob on arrival    Transfers Overall transfer level: Needs assistance Equipment used: None Transfers: Sit to/from Stand Sit to Stand: Supervision           General transfer comment: cues for hand placement and the RW      Balance Overall balance assessment: Needs assistance Sitting-balance support: Feet supported, No upper extremity supported Sitting balance-Leahy Scale: Good     Standing balance support: Single extremity supported, During functional activity, Reliant on assistive device for balance Standing balance-Leahy Scale: Fair                             ADL either performed or assessed with clinical judgement   ADL Overall ADL's : Needs assistance/impaired Eating/Feeding: Supervision/ safety   Grooming: Supervision/safety   Upper Body Bathing: Supervision/ safety   Lower Body Bathing: Supervison/ safety   Upper Body Dressing : Supervision/safety   Lower Body Dressing: Supervision/safety   Toilet Transfer: Supervision/safety   Toileting- Clothing Manipulation and Hygiene: Supervision/safety       Functional mobility during ADLs: Supervision/safety General ADL Comments: pt in bathroom on arrival. pt when exiting the bathroom with R UE on the center of the RW.[name] Pt needs cues for hand placement. pt when standing fromthe chair pulling on the RW.     Vision         Perception     Praxis      Pertinent Vitals/Pain Pain Assessment Pain Assessment: No/denies pain  Hand Dominance Right   Extremity/Trunk Assessment Upper Extremity Assessment Upper Extremity Assessment: Overall WFL for tasks assessed   Lower Extremity Assessment Lower Extremity Assessment: Defer to PT evaluation RLE Deficits / Details: Strength 5/5, pt reports prior ankle injury with decreased ROM LLE Deficits / Details: Strength  5/5   Cervical / Trunk Assessment Cervical / Trunk Assessment: Kyphotic   Communication Communication Communication: No difficulties   Cognition Arousal/Alertness: Awake/alert Behavior During Therapy: WFL for tasks assessed/performed Overall Cognitive Status: Impaired/Different from baseline Area of Impairment: Awareness, Safety/judgement                   Current Attention Level: Selective     Safety/Judgement: Decreased awareness of deficits Awareness: Emergent Problem Solving: Slow processing General Comments: pt making phone call during session to arrange electrical work. pt educated on the safety of return to work slowly and keeping the incision closed/ clean. pt R inattention to hand on RW. pt with bumping Rw into the wall on R.     General Comments  pt educated on washing aroudn the cut not on top of the cut. wife and brother present for education.    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 OT Goal Formulation: With patient Time For Goal Achievement: 04/11/23 Potential to Achieve Goals: Good  OT Frequency:      Co-evaluation              AM-PAC OT "6 Clicks" Daily Activity     Outcome Measure Help from another person eating meals?: None Help from another person taking care of personal grooming?: None Help from another person toileting, which includes using toliet, bedpan, or urinal?: A Little Help from another person bathing (including washing, rinsing, drying)?: A Little Help from another person to put on and taking off regular upper body clothing?: None Help from another person to put on and taking off regular lower body clothing?: A Little 6 Click Score: 21   End of Session Equipment Utilized During Treatment: Gait belt;Rolling walker (2 wheels) Nurse Communication: Mobility status;Precautions  Activity Tolerance: Patient tolerated treatment well Patient left: in chair;with call bell/phone within reach;with family/visitor present  OT Visit Diagnosis: Unsteadiness on feet (R26.81);Muscle weakness (generalized) (M62.81)  Time: 0865-7846 OT Time Calculation (min): 26 min Charges:  OT General Charges $OT Visit: 1 Visit OT Evaluation $OT Eval Moderate Complexity: 1 Mod   Brynn, OTR/L  Acute Rehabilitation Services Office: [phone] .   Mateo Flow 03/28/2023, 2:07 PM

## 2023-03-28 NOTE — Evaluation (Signed)
Physical Therapy Evaluation Patient Details Name: [name] MRN: [id number] DOB: Feb 28, 1946 Today's Date: 03/28/2023  History of Present Illness  Pt is a 77 y.o. M who presents 03/26/2023 with progressive R sided weakness and found to have large left sided SDH now s/p L burr hole evacuation of subdural hematoma. Significant PMH: AAA, atrial fibrillation, CAD s/p CABG.  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 Pt reports improvement in right sided strength post op. Overall, is mobilizing fairly, ambulating 150 ft with no assistive device at a min guard-min assist level. Demonstrates dynamic balance deficits, with intermittent scissoring and narrow BOS utilized during gait. Also, displays difficulty with dual tasking. Recommended walker for use initially for improved stability; pt/pt spouse verbalized understanding. Will also benefit from HHPT to address deficits and maximize functional mobility.      If plan is discharge home, recommend the following: A little help with walking and/or transfers;Assistance with cooking/housework;Assist for transportation;Help with stairs or ramp for entrance;Direct supervision/assist for medications management   Can travel by private vehicle        Equipment Recommendations None recommended by PT (pt has RW)  Recommendations for Other Services       Functional Status Assessment Patient has had a recent decline in their functional status and demonstrates the ability to make significant improvements in function in a reasonable and predictable amount of time.     Precautions / Restrictions Precautions Precautions: Fall Restrictions Weight Bearing Restrictions: No      Mobility  Bed Mobility Overal bed mobility: Modified Independent                  Transfers Overall transfer level: Needs assistance Equipment used: None Transfers: Sit to/from Stand Sit to Stand: Min guard            General transfer comment: Min guard for safety    Ambulation/Gait Ambulation/Gait assistance: Min guard, Min assist Gait Distance (Feet): 150 Feet Assistive device: None Gait Pattern/deviations: Step-through pattern, Decreased stride length, Scissoring, Narrow base of support Gait velocity: decreased Gait velocity interpretation: <1.8 ft/sec, indicate of risk for recurrent falls   General Gait Details: Intermittent scissoring and narrow BOS, requiring min guard-light minA for correction. Pt with increased difficulty dual tasking, needing to stop to perform head turns or locating objects in hall  Stairs            Wheelchair Mobility     Tilt Bed    Modified Rankin (Stroke Patients Only) Modified Rankin (Stroke Patients Only) Pre-Morbid Rankin Score: No symptoms Modified Rankin: Moderately severe disability     Balance Overall balance assessment: Needs assistance Sitting-balance support: Feet supported Sitting balance-Leahy Scale: Good     Standing balance support: No upper extremity supported, During functional activity Standing balance-Leahy Scale: Fair                               Pertinent Vitals/Pain Pain Assessment Pain Assessment: Faces Faces Pain Scale: Hurts a little bit Pain Location: headache Pain Descriptors / Indicators: Headache, Sore Pain Intervention(s): Monitored during session    Home Living Family/patient expects to be discharged to:: Private residence Living Arrangements: Spouse/significant other;Children (son, DIL) Available Help at Discharge: Family Type of Home: House Home Access: Stairs to enter Entrance Stairs-Rails: Lawyer of Steps: 3   Home Layout: One level Home Equipment: Grab bars - tub/shower;Shower seat - built in;Rolling  Walker (2 wheels)      Prior Function Prior Level of Function : Independent/Modified Independent;Working/employed             Mobility Comments: works as  an Radio broadcast assistant, started using a RW with increasing R sided weakness recently       Hand Dominance   Dominant Hand: Right    Extremity/Trunk Assessment   Upper Extremity Assessment Upper Extremity Assessment: Defer to OT evaluation    Lower Extremity Assessment Lower Extremity Assessment: RLE deficits/detail;LLE deficits/detail RLE Deficits / Details: Strength 5/5, pt reports prior ankle injury with decreased ROM LLE Deficits / Details: Strength 5/5    Cervical / Trunk Assessment Cervical / Trunk Assessment: Kyphotic  Communication   Communication: No difficulties  Cognition Arousal/Alertness: Awake/alert Behavior During Therapy: WFL for tasks assessed/performed Overall Cognitive Status: Impaired/Different from baseline Area of Impairment: Attention, Awareness, Problem solving, Safety/judgement                   Current Attention Level: Sustained     Safety/Judgement: Decreased awareness of deficits Awareness: Emergent Problem Solving: Requires verbal cues General Comments: Pt A&Ox4, very tangential, difficulty with dual tasking        General Comments      Exercises     Assessment/Plan    PT Assessment Patient needs continued PT services  PT Problem List Decreased balance;Decreased mobility;Decreased safety awareness       PT Treatment Interventions DME instruction;Gait training;Stair training;Functional mobility training;Therapeutic activities;Therapeutic exercise;Balance training;Patient/family education    PT Goals (Current goals can be found in the Care Plan section)  Acute Rehab PT Goals Patient Stated Goal: go home, return to work PT Goal Formulation: With patient Time For Goal Achievement: 04/11/23 Potential to Achieve Goals: Good    Frequency Min 1X/week     Co-evaluation               AM-PAC PT "6 Clicks" Mobility  Outcome Measure Help needed turning from your back to your side while in a flat bed without using  bedrails?: None Help needed moving from lying on your back to sitting on the side of a flat bed without using bedrails?: None Help needed moving to and from a bed to a chair (including a wheelchair)?: A Little Help needed standing up from a chair using your arms (e.g., wheelchair or bedside chair)?: A Little Help needed to walk in hospital room?: A Little Help needed climbing 3-5 steps with a railing? : A Little 6 Click Score: 20    End of Session Equipment Utilized During Treatment: Gait belt Activity Tolerance: Patient tolerated treatment well Patient left: in chair;with call bell/phone within reach;with chair alarm set Nurse Communication: Mobility status PT Visit Diagnosis: Unsteadiness on feet (R26.81)    Time: 1100-1130 PT Time Calculation (min) (ACUTE ONLY): 30 min   Charges:   PT Evaluation $PT Eval Moderate Complexity: 1 Mod PT Treatments $Therapeutic Activity: 8-22 mins PT General Charges $$ ACUTE PT VISIT: 1 Visit         Lillia Pauls, PT, DPT Acute Rehabilitation Services Office [phone]   Norval Morton 03/28/2023, 1:07 PM

## 2023-03-28 NOTE — Progress Notes (Addendum)
Pt w/ soft BPs and MAP less than 65. Pt asymptomatic and w/o neuro change. [facility] (Neurosurgery) informed via receptionist. Pt due for repeat CT. Esperanza Richters informed that pt will be taken down late, but would like to address BP first.   See new order per Esperanza Richters.Marland KitchenMarland Kitchen

## 2023-03-28 NOTE — Discharge Instructions (Addendum)
Dear [name],   Thank you so much for allowing [place] to be part of your care!  You were admitted to [facility] for a subdural hematoma in your brain. You were given reversal of your blood thinner. You received a surgery to decompress this.   Please hold your Xarelto until you have follow up with your cardiologist and neurosurgeon.  We changed your Tikosyn dosing to 250 mcg twice a day due to your kidney function.[name]   POST-HOSPITAL & CARE INSTRUCTIONS Schedule follow up with your PCP and cardiologist You are to see the neurosurgeon in 2 weeks for follow up  Please let PCP/Specialists know of any changes that were made.  Please see medications section of this packet for any medication changes.   DOCTOR'S APPOINTMENT & FOLLOW UP CARE INSTRUCTIONS  No future appointments.  RETURN PRECAUTIONS: Headaches, vision change, vomiting  Take care and be well!  Family Medicine Teaching Service  [place]  [facility]  [address] [place], [place] [address] [phone]

## 2023-03-28 NOTE — Progress Notes (Signed)
Neurosurgery Service Progress Note  Subjective: No acute events overnight, R side strength subjectively dramatically better, he's very pleased   Objective: Vitals:   03/28/23 0630 03/28/23 0700 03/28/23 0703 03/28/23 0800  BP: (!) 126/57 (!) 121/109 135/77 125/61  Pulse: (!) 49 60 (!) 59 61  Resp: 20 17 (!) 22 11  Temp:    (!) 97.5 F (36.4 C)  TempSrc:    Oral  SpO2: 95% 93% 96% 95%  Weight:      Height:        Physical Exam: Strength 5/5 on L, 4+/5 on R with mild drift, and SILTx4, incision c/d/I   Assessment & Plan: 77 y.o. man s/p burr hole evac of SDH, recovering well.  -will transfer back out of the unit -can go home from a neurosurgical standpoint once he works with PT to make sure he can ambulate safely -needs to be off rivaroxaban until he sees me for his 2 week follow up visit -okay for DVT chemoprophylaxis (not therapeutic dosing) from my standpoint starting 8/1 if he's still inpatient, prefer SQH 5000tid, but okay with prophylactic dose enoxaparin if needed  Jadene Pierini  03/28/23 8:43 AM

## 2023-03-28 NOTE — TOC Transition Note (Signed)
Transition of Care [facility]) - CM/SW Discharge Note   Patient Details  Name: [name] MRN: [id number] Date of Birth: Sep 08, 1945  [facility] CM/SW Contact:  Lawerance Sabal, RN Phone Number: 03/28/2023, 1:18 PM   Clinical Narrative:     Sherron Monday w patient;s wife over the phone. Discussed rec for [facility] services. She is agreeable and without preference for provider. [facility] accepted referral.  No other TOC needs for DC   Final next level of care: Home w Home Health Services Barriers to Discharge: No Barriers Identified   Patient Goals and CMS Choice CMS Medicare.gov Compare Post Acute Care list provided to:: Patient Choice offered to / list presented to : Patient  Discharge Placement                         Discharge Plan and Services Additional resources added to the After Visit Summary for                  DME Arranged: N/A         HH Arranged: PT HH Agency: [facility] Date [facility] Agency Contacted: 03/28/23 Time HH Agency Contacted: 1318 Representative spoke with at [facility] Agency: Kandee Keen  Social Determinants of Health (SDOH) Interventions SDOH Screenings   Food Insecurity: No Food Insecurity (03/26/2023)  Housing: Low Risk  (03/26/2023)  Transportation Needs: No Transportation Needs (03/26/2023)  Utilities: Not At Risk (03/26/2023)  Depression (PHQ2-9): Low Risk  (01/20/2020)  Social Connections: Unknown (01/31/2023)   Received from [facility], [facility]  Stress: No Stress Concern Present (01/31/2023)   Received from [facility], [facility]  Tobacco Use: Medium Risk (03/27/2023)     Readmission Risk Interventions     No data to display

## 2023-03-28 NOTE — Progress Notes (Signed)
eLink Physician-Brief Progress Note Patient Name: [name] DOB: 08-Oct-1945 MRN: [id number]   Date of Service  03/28/2023  HPI/Events of Note  Patient with sub-dural hematoma s/p Ines Bloomer hole. Neurosurgery is primary.  eICU Interventions  New Patient Evaluation.        Thomasene Lot Jenniefer Salak 03/28/2023, 12:26 AM

## 2023-03-28 NOTE — Anesthesia Postprocedure Evaluation (Signed)
Anesthesia Post Note  Patient: [name]  Procedure(s) Performed: Ines Bloomer HOLE EVASCUATION OF SUBDURAL HEMATOMA (Left: Head)     Patient location during evaluation: PACU Anesthesia Type: General Level of consciousness: awake and alert Pain management: pain level controlled Vital Signs Assessment: post-procedure vital signs reviewed and stable Respiratory status: spontaneous breathing, nonlabored ventilation, respiratory function stable and patient connected to nasal cannula oxygen Cardiovascular status: blood pressure returned to baseline and stable Postop Assessment: no apparent nausea or vomiting Anesthetic complications: no  No notable events documented.  Last Vitals:  Vitals:   03/28/23 0010 03/28/23 0030  BP: 126/79 109/64  Pulse: 65 (!) 59  Resp: 15 16  Temp: 36.9 C   SpO2: 97% 95%    Last Pain:  Vitals:   03/28/23 0010  TempSrc: Oral  PainSc: 0-No pain                 Shelton Silvas

## 2023-03-28 NOTE — TOC CM/SW Note (Signed)
Transition of Care [facility]) - Inpatient Brief Assessment   Patient Details  Name: [name] MRN: [id number] Date of Birth: 01-10-1946  Transition of Care [facility]) CM/SW Contact:    Mearl Latin, LCSW Phone Number: 03/28/2023, 11:31 AM   Clinical Narrative: Patient admitted from home with spouse. No current TOC needs identified but please place consult as needs arise.      Transition of Care Asessment: Insurance and Status: Insurance coverage has been reviewed Patient has primary care physician: Yes Home environment has been reviewed: From home Prior level of function:: Independent Prior/Current Home Services: No current home services Social Determinants of Health Reivew: SDOH reviewed no interventions necessary Readmission risk has been reviewed: Yes Transition of care needs: no transition of care needs at this time

## 2023-03-28 NOTE — Plan of Care (Signed)
  Problem: Health Behavior/Discharge Planning: Goal: Ability to manage health-related needs will improve Outcome: Progressing   Problem: Clinical Measurements: Goal: Will remain free from infection Outcome: Progressing   

## 2023-03-31 DIAGNOSIS — I10 Essential (primary) hypertension: Secondary | ICD-10-CM | POA: Diagnosis not present

## 2023-03-31 DIAGNOSIS — J449 Chronic obstructive pulmonary disease, unspecified: Secondary | ICD-10-CM | POA: Diagnosis not present

## 2023-03-31 DIAGNOSIS — Z9181 History of falling: Secondary | ICD-10-CM | POA: Diagnosis not present

## 2023-03-31 DIAGNOSIS — F419 Anxiety disorder, unspecified: Secondary | ICD-10-CM | POA: Diagnosis not present

## 2023-03-31 DIAGNOSIS — I2510 Atherosclerotic heart disease of native coronary artery without angina pectoris: Secondary | ICD-10-CM | POA: Diagnosis not present

## 2023-03-31 DIAGNOSIS — I714 Abdominal aortic aneurysm, without rupture, unspecified: Secondary | ICD-10-CM | POA: Diagnosis not present

## 2023-03-31 DIAGNOSIS — I480 Paroxysmal atrial fibrillation: Secondary | ICD-10-CM | POA: Diagnosis not present

## 2023-03-31 DIAGNOSIS — Z87891 Personal history of nicotine dependence: Secondary | ICD-10-CM | POA: Diagnosis not present

## 2023-03-31 DIAGNOSIS — Z951 Presence of aortocoronary bypass graft: Secondary | ICD-10-CM | POA: Diagnosis not present

## 2023-03-31 DIAGNOSIS — G8191 Hemiplegia, unspecified affecting right dominant side: Secondary | ICD-10-CM | POA: Diagnosis not present

## 2023-03-31 DIAGNOSIS — S065XAD Traumatic subdural hemorrhage with loss of consciousness status unknown, subsequent encounter: Secondary | ICD-10-CM | POA: Diagnosis not present

## 2023-04-02 ENCOUNTER — Encounter (HOSPITAL_COMMUNITY): Payer: Self-pay

## 2023-04-02 ENCOUNTER — Emergency Department (HOSPITAL_COMMUNITY): Payer: Medicare Other

## 2023-04-02 ENCOUNTER — Other Ambulatory Visit: Payer: Self-pay

## 2023-04-02 ENCOUNTER — Inpatient Hospital Stay (HOSPITAL_COMMUNITY)
Admission: EM | Admit: 2023-04-02 | Discharge: 2023-04-06 | DRG: 064 | Disposition: A | Discharge status: Home / Self Care | Payer: Medicare Other | Attending: Family Medicine | Admitting: Family Medicine

## 2023-04-02 DIAGNOSIS — G9389 Other specified disorders of brain: Secondary | ICD-10-CM | POA: Diagnosis not present

## 2023-04-02 DIAGNOSIS — Z8616 Personal history of COVID-19: Secondary | ICD-10-CM | POA: Diagnosis not present

## 2023-04-02 DIAGNOSIS — I480 Paroxysmal atrial fibrillation: Secondary | ICD-10-CM | POA: Diagnosis present

## 2023-04-02 DIAGNOSIS — Z951 Presence of aortocoronary bypass graft: Secondary | ICD-10-CM

## 2023-04-02 DIAGNOSIS — I2510 Atherosclerotic heart disease of native coronary artery without angina pectoris: Secondary | ICD-10-CM | POA: Diagnosis not present

## 2023-04-02 DIAGNOSIS — R4182 Altered mental status, unspecified: Secondary | ICD-10-CM | POA: Diagnosis not present

## 2023-04-02 DIAGNOSIS — S065X0A Traumatic subdural hemorrhage without loss of consciousness, initial encounter: Secondary | ICD-10-CM | POA: Diagnosis not present

## 2023-04-02 DIAGNOSIS — R9089 Other abnormal findings on diagnostic imaging of central nervous system: Secondary | ICD-10-CM | POA: Diagnosis not present

## 2023-04-02 DIAGNOSIS — Z888 Allergy status to other drugs, medicaments and biological substances status: Secondary | ICD-10-CM | POA: Diagnosis not present

## 2023-04-02 DIAGNOSIS — Z886 Allergy status to analgesic agent status: Secondary | ICD-10-CM

## 2023-04-02 DIAGNOSIS — R918 Other nonspecific abnormal finding of lung field: Secondary | ICD-10-CM | POA: Diagnosis not present

## 2023-04-02 DIAGNOSIS — R69 Illness, unspecified: Secondary | ICD-10-CM | POA: Diagnosis not present

## 2023-04-02 DIAGNOSIS — Z8249 Family history of ischemic heart disease and other diseases of the circulatory system: Secondary | ICD-10-CM | POA: Diagnosis not present

## 2023-04-02 DIAGNOSIS — I671 Cerebral aneurysm, nonruptured: Secondary | ICD-10-CM | POA: Diagnosis not present

## 2023-04-02 DIAGNOSIS — I7143 Infrarenal abdominal aortic aneurysm, without rupture: Secondary | ICD-10-CM | POA: Diagnosis present

## 2023-04-02 DIAGNOSIS — S065XAA Traumatic subdural hemorrhage with loss of consciousness status unknown, initial encounter: Secondary | ICD-10-CM

## 2023-04-02 DIAGNOSIS — Z87891 Personal history of nicotine dependence: Secondary | ICD-10-CM | POA: Diagnosis not present

## 2023-04-02 DIAGNOSIS — Z9181 History of falling: Secondary | ICD-10-CM | POA: Diagnosis not present

## 2023-04-02 DIAGNOSIS — I77810 Thoracic aortic ectasia: Secondary | ICD-10-CM | POA: Diagnosis not present

## 2023-04-02 DIAGNOSIS — R531 Weakness: Secondary | ICD-10-CM

## 2023-04-02 DIAGNOSIS — I700 Atherosclerosis of aorta: Secondary | ICD-10-CM | POA: Diagnosis not present

## 2023-04-02 DIAGNOSIS — F419 Anxiety disorder, unspecified: Secondary | ICD-10-CM | POA: Diagnosis present

## 2023-04-02 DIAGNOSIS — S065XAD Traumatic subdural hemorrhage with loss of consciousness status unknown, subsequent encounter: Secondary | ICD-10-CM | POA: Diagnosis not present

## 2023-04-02 DIAGNOSIS — I6203 Nontraumatic chronic subdural hemorrhage: Secondary | ICD-10-CM | POA: Diagnosis not present

## 2023-04-02 DIAGNOSIS — I10 Essential (primary) hypertension: Secondary | ICD-10-CM | POA: Diagnosis present

## 2023-04-02 DIAGNOSIS — J431 Panlobular emphysema: Secondary | ICD-10-CM | POA: Diagnosis not present

## 2023-04-02 DIAGNOSIS — R296 Repeated falls: Secondary | ICD-10-CM | POA: Diagnosis present

## 2023-04-02 DIAGNOSIS — U071 COVID-19: Secondary | ICD-10-CM | POA: Diagnosis not present

## 2023-04-02 DIAGNOSIS — R54 Age-related physical debility: Secondary | ICD-10-CM | POA: Diagnosis present

## 2023-04-02 DIAGNOSIS — I6200 Nontraumatic subdural hemorrhage, unspecified: Secondary | ICD-10-CM | POA: Diagnosis not present

## 2023-04-02 DIAGNOSIS — R059 Cough, unspecified: Secondary | ICD-10-CM | POA: Diagnosis not present

## 2023-04-02 DIAGNOSIS — Z833 Family history of diabetes mellitus: Secondary | ICD-10-CM

## 2023-04-02 DIAGNOSIS — J449 Chronic obstructive pulmonary disease, unspecified: Secondary | ICD-10-CM | POA: Diagnosis present

## 2023-04-02 DIAGNOSIS — G8191 Hemiplegia, unspecified affecting right dominant side: Secondary | ICD-10-CM | POA: Diagnosis not present

## 2023-04-02 LAB — CBC WITH DIFFERENTIAL/PLATELET
Abs Immature Granulocytes: 0.04 10*3/uL (ref 0.00–0.07)
Basophils Absolute: 0.1 10*3/uL (ref 0.0–0.1)
Basophils Relative: 0 %
Eosinophils Absolute: 0.1 10*3/uL (ref 0.0–0.5)
Eosinophils Relative: 1 %
HCT: 45.1 % (ref 39.0–52.0)
Hemoglobin: 14.1 g/dL (ref 13.0–17.0)
Immature Granulocytes: 0 %
Lymphocytes Relative: 14 %
Lymphs Abs: 1.6 10*3/uL (ref 0.7–4.0)
MCH: 30.4 pg (ref 26.0–34.0)
MCHC: 31.3 g/dL (ref 30.0–36.0)
MCV: 97.2 fL (ref 80.0–100.0)
Monocytes Absolute: 0.9 10*3/uL (ref 0.1–1.0)
Monocytes Relative: 8 %
Neutro Abs: 9.1 10*3/uL — ABNORMAL HIGH (ref 1.7–7.7)
Neutrophils Relative %: 77 %
Platelets: 278 10*3/uL (ref 150–400)
RBC: 4.64 MIL/uL (ref 4.22–5.81)
RDW: 13.6 % (ref 11.5–15.5)
WBC: 11.8 10*3/uL — ABNORMAL HIGH (ref 4.0–10.5)
nRBC: 0 % (ref 0.0–0.2)

## 2023-04-02 LAB — URINALYSIS, ROUTINE W REFLEX MICROSCOPIC
Bacteria, UA: NONE SEEN
Bilirubin Urine: NEGATIVE
Glucose, UA: NEGATIVE mg/dL
Ketones, ur: NEGATIVE mg/dL
Leukocytes,Ua: NEGATIVE
Nitrite: NEGATIVE
Protein, ur: 100 mg/dL — AB
Specific Gravity, Urine: 1.02 (ref 1.005–1.030)
pH: 5 (ref 5.0–8.0)

## 2023-04-02 LAB — COMPREHENSIVE METABOLIC PANEL
ALT: 19 U/L (ref 0–44)
AST: 23 U/L (ref 15–41)
Albumin: 3.4 g/dL — ABNORMAL LOW (ref 3.5–5.0)
Alkaline Phosphatase: 89 U/L (ref 38–126)
Anion gap: 11 (ref 5–15)
BUN: 18 mg/dL (ref 8–23)
CO2: 24 mmol/L (ref 22–32)
Calcium: 9.2 mg/dL (ref 8.9–10.3)
Chloride: 102 mmol/L (ref 98–111)
Creatinine, Ser: 1.26 mg/dL — ABNORMAL HIGH (ref 0.61–1.24)
GFR, Estimated: 58 mL/min — ABNORMAL LOW (ref >60)
Glucose, Bld: 111 mg/dL — ABNORMAL HIGH (ref 70–99)
Potassium: 4.2 mmol/L (ref 3.5–5.1)
Sodium: 137 mmol/L (ref 135–145)
Total Bilirubin: 0.9 mg/dL (ref 0.3–1.2)
Total Protein: 7.7 g/dL (ref 6.5–8.1)

## 2023-04-02 LAB — APTT: aPTT: 33 seconds (ref 24–36)

## 2023-04-02 LAB — PROTIME-INR
INR: 1 (ref 0.8–1.2)
Prothrombin Time: 13.8 seconds (ref 11.4–15.2)

## 2023-04-02 LAB — I-STAT CG4 LACTIC ACID, ED: Lactic Acid, Venous: 1.8 mmol/L (ref 0.5–1.9)

## 2023-04-02 LAB — SARS CORONAVIRUS 2 BY RT PCR: SARS Coronavirus 2 by RT PCR: POSITIVE — AB

## 2023-04-02 MED ORDER — VANCOMYCIN HCL IN DEXTROSE 1-5 GM/200ML-% IV SOLN: 1000.0000 mg | Freq: Once | INTRAVENOUS | Status: DC

## 2023-04-02 MED ORDER — LACTATED RINGERS IV SOLN: INTRAVENOUS | Status: AC

## 2023-04-02 MED ORDER — VANCOMYCIN HCL 1250 MG/250ML IV SOLN
1250.0000 mg | Freq: Once | INTRAVENOUS | Status: AC
  Administered 2023-04-03: 1250 mg via INTRAVENOUS
  Filled 2023-04-02: qty 250

## 2023-04-02 MED ORDER — ACETAMINOPHEN 500 MG PO TABS
1000.0000 mg | ORAL_TABLET | Freq: Once | ORAL | Status: AC
  Administered 2023-04-02: 1000 mg via ORAL
  Filled 2023-04-02: qty 2

## 2023-04-02 MED ORDER — LACTATED RINGERS IV BOLUS
1000.0000 mL | Freq: Once | INTRAVENOUS | Status: AC
  Administered 2023-04-02: 1000 mL via INTRAVENOUS

## 2023-04-02 MED ORDER — METRONIDAZOLE 500 MG/100ML IV SOLN
500.0000 mg | Freq: Once | INTRAVENOUS | Status: AC
  Administered 2023-04-02: 500 mg via INTRAVENOUS
  Filled 2023-04-02: qty 100

## 2023-04-02 MED ORDER — SODIUM CHLORIDE 0.9 % IV SOLN
2.0000 g | Freq: Once | INTRAVENOUS | Status: AC
  Administered 2023-04-02: 2 g via INTRAVENOUS
  Filled 2023-04-02: qty 12.5

## 2023-04-02 MED ORDER — LACTATED RINGERS IV BOLUS (SEPSIS)
500.0000 mL | Freq: Once | INTRAVENOUS | Status: AC
  Administered 2023-04-02: 500 mL via INTRAVENOUS

## 2023-04-02 NOTE — Sepsis Progress Note (Signed)
Elink monitoring for the code sepsis protocol.  

## 2023-04-02 NOTE — ED Notes (Signed)
With CT 

## 2023-04-02 NOTE — ED Triage Notes (Addendum)
Patient arrives with from home with weakness in his legs and hands. Patent had brain surgery here at [facility] 3 days ago. Today the patient began having pain on the left side near the surgical site and progressive weakness in his legs and arms. States his right leg and arm are more weak. Denis falls, no LOC. Hx of Afib.

## 2023-04-02 NOTE — Progress Notes (Signed)
ED Pharmacy Antibiotic Sign Off An antibiotic consult was received from an ED provider for cefepime and vancomycin per pharmacy dosing for sepsis. A chart review was completed to assess appropriateness.  A single dose of cefepime, vancomycin, and flagyl  (dose appropriate) placed by the ED provider.   The following one time order(s) were placed per pharmacy consult: None  Further antibiotic and/or antibiotic pharmacy consults should be ordered by the admitting provider if indicated.   Thank you for allowing pharmacy to be a part of this patient's care.   Delmar Landau, PharmD, BCPS 04/02/2023 11:06 PM ED Clinical Pharmacist -  [phone]

## 2023-04-02 NOTE — ED Provider Notes (Signed)
[facility]-EMERGENCY DEPT [facility] Emergency Department Provider Note MRN:  [id number]  Arrival date & time: 04/03/23     Chief Complaint   Extremity Weakness   History of Present Illness   [name] is a 77 y.o. year-old male presents to the ED with chief complaint of headache and worsening RLE and RUE weakness.  Is s/p 7 days burr hole for subdural hematoma decompression with Dr. Maurice Small.  Was admit to family practice.  Had initially been doing well at home, but over the past "couple" of days patient has had worsening weakness on his right sided as well as intermittent confusion.  Family called the neurosurgery office tonight and were instructed to bring patient in for evaluation.  Family reports fevers at home to 101.  He states that he has been having chills.  He reports cough and states that he had a faintly positive home COVID test today.  He states that he has been urinating on himself, but notes that this is only because he is unable to make it to the bathroom.  He states that he is able to feel when his bladder is full.  History provided by patient.   Review of Systems  Pertinent positive and negative review of systems noted in HPI.    Physical Exam   Vitals:   04/03/23 0015 04/03/23 0039  BP: 102/68 114/74  Pulse: (!) 115   Resp: (!) 21   Temp:    SpO2: 97%     CONSTITUTIONAL:  non toxic-appearing, NAD NEURO:  Alert and oriented x 3, speech is clear, RLE and RUE weakness is noted on exam, unable to lift RLE off of the stretcher, decreased resisted strength of RUE EYES:  eyes equal and reactive ENT/NECK:  Supple, no stridor  CARDIO:  tachycardic, regular rhythm, appears well-perfused  PULM:  No respiratory distress, CTAB GI/GU:  non-distended,  MSK/SPINE:  No gross deformities, no edema, moves all extremities  SKIN:  no rash, left parietal scalp skin incision is CDI   *Additional and/or pertinent findings included in MDM below  Diagnostic and  Interventional Summary    EKG Interpretation Date/Time:    Ventricular Rate:    PR Interval:    QRS Duration:    QT Interval:    QTC Calculation:   R Axis:      Text Interpretation:         Labs Reviewed  SARS CORONAVIRUS 2 BY RT PCR - Abnormal; Notable for the following components:      Result Value   SARS Coronavirus 2 by RT PCR POSITIVE (*)    All other components within normal limits  CBC WITH DIFFERENTIAL/PLATELET - Abnormal; Notable for the following components:   WBC 11.8 (*)    Neutro Abs 9.1 (*)    All other components within normal limits  COMPREHENSIVE METABOLIC PANEL - Abnormal; Notable for the following components:   Glucose, Bld 111 (*)    Creatinine, Ser 1.26 (*)    Albumin 3.4 (*)    GFR, Estimated 58 (*)    All other components within normal limits  URINALYSIS, ROUTINE W REFLEX MICROSCOPIC - Abnormal; Notable for the following components:   Hgb urine dipstick SMALL (*)    Protein, ur 100 (*)    All other components within normal limits  CULTURE, BLOOD (SINGLE)  PROTIME-INR  APTT  I-STAT CG4 LACTIC ACID, ED  I-STAT CG4 LACTIC ACID, ED    DG Chest [facility] 1 View  Final Result  CT Head Wo Contrast  Final Result      Medications  lactated ringers infusion ( Intravenous New Bag/Given 04/03/23 0044)  vancomycin (VANCOREADY) IVPB 1250 mg/250 mL (1,250 mg Intravenous New Bag/Given 04/03/23 0046)  lactated ringers bolus 1,000 mL (0 mLs Intravenous Stopped 04/03/23 0034)  acetaminophen (TYLENOL) tablet 1,000 mg (1,000 mg Oral Given 04/02/23 2257)  lactated ringers bolus 500 mL (500 mLs Intravenous New Bag/Given 04/02/23 2352)  ceFEPIme (MAXIPIME) 2 g in sodium chloride 0.9 % 100 mL IVPB (0 g Intravenous Stopped 04/03/23 0047)  metroNIDAZOLE (FLAGYL) IVPB 500 mg (0 mg Intravenous Stopped 04/03/23 0047)  diltiazem (CARDIZEM) tablet 30 mg (30 mg Oral Given 04/03/23 0039)     Procedures  /  Critical Care .Critical Care  Performed by: Roxy Horseman, PA-C Authorized  by: Roxy Horseman, PA-C   Critical care provider statement:    Critical care time (minutes):  48   Critical care was necessary to treat or prevent imminent or life-threatening deterioration of the following conditions:  CNS failure or compromise   Critical care was time spent personally by me on the following activities:  Development of treatment plan with patient or surrogate, discussions with consultants, evaluation of patient's response to treatment, examination of patient, ordering and review of laboratory studies, ordering and review of radiographic studies, ordering and performing treatments and interventions, pulse oximetry, re-evaluation of patient's condition and review of old charts   ED Course and Medical Decision Making  I have reviewed the triage vital signs, the nursing notes, and pertinent available records from the EMR.  Social Determinants Affecting Complexity of Care: Patient has no clinically significant social determinants affecting this chief complaint..   ED Course: Clinical Course as of 04/03/23 0120  Mon Apr 02, 2023  2251 I consulted with Dr. Lovell Sheehan, who agrees with plan for medicine admission.  He says that he will have Dr. Maurice Small round on the patient in the morning.  No emergent surgical intervention. [RB]  Tue Apr 03, 2023  0000 Family Practice paged by me. [RB]    Clinical Course User Index [RB] Roxy Horseman, PA-C    Medical Decision Making Amount and/or Complexity of Data Reviewed Labs: ordered.    Details: Mild leukocytosis to 11.8, no anemia Cr. 1.26 is about baseline, no significant electrolyte derangement Radiology: ordered and independent interpretation performed.    Details: Subdural hematoma with midline shift, gas present without subdural space ECG/medicine tests: ordered.  Risk OTC drugs. Prescription drug management. Decision regarding hospitalization.         Consultants: I consulted with neurosurgery, Dr. Lovell Sheehan, who  recommends admission.  Neurosurgery to follow along. I consulted with family practice residency team, who are appreciated for admitting.   Treatment and Plan: Patient's exam and diagnostic results are concerning for post-op problem, subdural hematoma, fever, tachcyardia.  Feel that patient will need admission to the hospital for further treatment and evaluation.    Final Clinical Impressions(s) / ED Diagnoses     ICD-10-CM   1. Weakness  R53.1     2. Subdural hematoma (HCC)  S06.5XAA     3. COVID-19  U07.1       ED Discharge Orders     None         Discharge Instructions Discussed with and Provided to Patient:   Discharge Instructions   None      Roxy Horseman, PA-C 04/03/23 0121    Ian Leiter, DO 04/03/23 1555

## 2023-04-02 NOTE — ED Provider Triage Note (Signed)
Emergency Medicine Provider Triage Evaluation Note  [name] , a 77 y.o. male  was evaluated in triage.  Pt complains of right-sided arm and leg weakness since his hematoma evacuation 3 days ago.  Wife also states he seems more confused.  Denies any fevers or chills.  Review of Systems  Positive: As above Negative: As above   Physical Exam  BP 128/89   Pulse (!) 131   Temp 99 F (37.2 C)   Resp 20   SpO2 95%  Gen:   Awake, no distress   Resp:  Normal effort  MSK:   Moves extremities without difficulty  Other:  5 out of 5 strength in the left upper and lower extremity, 4 out of 5 strength in the right upper and lower extremity Patient able to raise both eyebrows, puff out cheeks, smile symmetrically  Patient slow when explaining the history, having to stop and think about what words to use  Medical Decision Making  Medically screening exam initiated at 8:32 PM.  Appropriate orders placed.  [name] was informed that the remainder of the evaluation will be completed by another provider, this initial triage assessment does not replace that evaluation, and the importance of remaining in the ED until their evaluation is complete.     Arabella Merles, PA-C 04/02/23 2038

## 2023-04-03 ENCOUNTER — Observation Stay (HOSPITAL_COMMUNITY): Payer: Medicare Other

## 2023-04-03 ENCOUNTER — Encounter (HOSPITAL_COMMUNITY): Payer: Self-pay | Admitting: Family Medicine

## 2023-04-03 DIAGNOSIS — I77810 Thoracic aortic ectasia: Secondary | ICD-10-CM | POA: Diagnosis not present

## 2023-04-03 DIAGNOSIS — I671 Cerebral aneurysm, nonruptured: Secondary | ICD-10-CM | POA: Diagnosis not present

## 2023-04-03 DIAGNOSIS — R54 Age-related physical debility: Secondary | ICD-10-CM | POA: Diagnosis present

## 2023-04-03 DIAGNOSIS — Z888 Allergy status to other drugs, medicaments and biological substances status: Secondary | ICD-10-CM | POA: Diagnosis not present

## 2023-04-03 DIAGNOSIS — S065XAA Traumatic subdural hemorrhage with loss of consciousness status unknown, initial encounter: Secondary | ICD-10-CM | POA: Diagnosis not present

## 2023-04-03 DIAGNOSIS — Z8616 Personal history of COVID-19: Secondary | ICD-10-CM | POA: Diagnosis not present

## 2023-04-03 DIAGNOSIS — J431 Panlobular emphysema: Secondary | ICD-10-CM | POA: Diagnosis not present

## 2023-04-03 DIAGNOSIS — R531 Weakness: Secondary | ICD-10-CM | POA: Diagnosis present

## 2023-04-03 DIAGNOSIS — I2510 Atherosclerotic heart disease of native coronary artery without angina pectoris: Secondary | ICD-10-CM | POA: Diagnosis present

## 2023-04-03 DIAGNOSIS — G9389 Other specified disorders of brain: Secondary | ICD-10-CM | POA: Diagnosis not present

## 2023-04-03 DIAGNOSIS — I6203 Nontraumatic chronic subdural hemorrhage: Secondary | ICD-10-CM | POA: Diagnosis not present

## 2023-04-03 DIAGNOSIS — I10 Essential (primary) hypertension: Secondary | ICD-10-CM | POA: Diagnosis present

## 2023-04-03 DIAGNOSIS — U071 COVID-19: Secondary | ICD-10-CM | POA: Diagnosis present

## 2023-04-03 DIAGNOSIS — I480 Paroxysmal atrial fibrillation: Secondary | ICD-10-CM | POA: Diagnosis present

## 2023-04-03 DIAGNOSIS — I6200 Nontraumatic subdural hemorrhage, unspecified: Secondary | ICD-10-CM | POA: Diagnosis present

## 2023-04-03 DIAGNOSIS — Z9181 History of falling: Secondary | ICD-10-CM | POA: Diagnosis not present

## 2023-04-03 DIAGNOSIS — Z87891 Personal history of nicotine dependence: Secondary | ICD-10-CM | POA: Diagnosis not present

## 2023-04-03 DIAGNOSIS — F419 Anxiety disorder, unspecified: Secondary | ICD-10-CM | POA: Diagnosis present

## 2023-04-03 DIAGNOSIS — R918 Other nonspecific abnormal finding of lung field: Secondary | ICD-10-CM | POA: Diagnosis not present

## 2023-04-03 DIAGNOSIS — I7143 Infrarenal abdominal aortic aneurysm, without rupture: Secondary | ICD-10-CM | POA: Diagnosis present

## 2023-04-03 DIAGNOSIS — Z886 Allergy status to analgesic agent status: Secondary | ICD-10-CM | POA: Diagnosis not present

## 2023-04-03 DIAGNOSIS — R4182 Altered mental status, unspecified: Secondary | ICD-10-CM | POA: Diagnosis not present

## 2023-04-03 DIAGNOSIS — R296 Repeated falls: Secondary | ICD-10-CM | POA: Diagnosis present

## 2023-04-03 DIAGNOSIS — Z951 Presence of aortocoronary bypass graft: Secondary | ICD-10-CM | POA: Diagnosis not present

## 2023-04-03 DIAGNOSIS — R9089 Other abnormal findings on diagnostic imaging of central nervous system: Secondary | ICD-10-CM | POA: Diagnosis not present

## 2023-04-03 DIAGNOSIS — Z833 Family history of diabetes mellitus: Secondary | ICD-10-CM | POA: Diagnosis not present

## 2023-04-03 DIAGNOSIS — Z8249 Family history of ischemic heart disease and other diseases of the circulatory system: Secondary | ICD-10-CM | POA: Diagnosis not present

## 2023-04-03 DIAGNOSIS — J449 Chronic obstructive pulmonary disease, unspecified: Secondary | ICD-10-CM | POA: Diagnosis present

## 2023-04-03 LAB — BASIC METABOLIC PANEL
Anion gap: 11 (ref 5–15)
BUN: 14 mg/dL (ref 8–23)
CO2: 24 mmol/L (ref 22–32)
Calcium: 8.6 mg/dL — ABNORMAL LOW (ref 8.9–10.3)
Chloride: 100 mmol/L (ref 98–111)
Creatinine, Ser: 1.13 mg/dL (ref 0.61–1.24)
GFR, Estimated: >60 mL/min (ref >60)
Glucose, Bld: 116 mg/dL — ABNORMAL HIGH (ref 70–99)
Potassium: 3.9 mmol/L (ref 3.5–5.1)
Sodium: 135 mmol/L (ref 135–145)

## 2023-04-03 LAB — CBC
HCT: 38.9 % — ABNORMAL LOW (ref 39.0–52.0)
Hemoglobin: 12.4 g/dL — ABNORMAL LOW (ref 13.0–17.0)
MCH: 30.8 pg (ref 26.0–34.0)
MCHC: 31.9 g/dL (ref 30.0–36.0)
MCV: 96.5 fL (ref 80.0–100.0)
Platelets: 227 10*3/uL (ref 150–400)
RBC: 4.03 MIL/uL — ABNORMAL LOW (ref 4.22–5.81)
RDW: 13.6 % (ref 11.5–15.5)
WBC: 11.8 10*3/uL — ABNORMAL HIGH (ref 4.0–10.5)
nRBC: 0 % (ref 0.0–0.2)

## 2023-04-03 LAB — TROPONIN I (HIGH SENSITIVITY)
Troponin I (High Sensitivity): 20 ng/L — ABNORMAL HIGH (ref <18)
Troponin I (High Sensitivity): 19 ng/L — ABNORMAL HIGH (ref <18)

## 2023-04-03 MED ORDER — DILTIAZEM HCL 30 MG PO TABS
30.0000 mg | ORAL_TABLET | Freq: Once | ORAL | Status: AC
  Administered 2023-04-03: 30 mg via ORAL
  Filled 2023-04-03: qty 1

## 2023-04-03 MED ORDER — LACTATED RINGERS IV BOLUS (SEPSIS)
500.0000 mL | Freq: Once | INTRAVENOUS | Status: AC
  Administered 2023-04-03: 500 mL via INTRAVENOUS

## 2023-04-03 MED ORDER — DILTIAZEM HCL 30 MG PO TABS
30.0000 mg | ORAL_TABLET | ORAL | Status: DC | PRN
  Administered 2023-04-03 – 2023-04-06 (×4): 30 mg via ORAL
  Filled 2023-04-03 (×4): qty 1

## 2023-04-03 MED ORDER — AMLODIPINE BESYLATE 5 MG PO TABS
5.0000 mg | ORAL_TABLET | Freq: Every day | ORAL | Status: DC
  Administered 2023-04-03 – 2023-04-06 (×4): 5 mg via ORAL
  Filled 2023-04-03 (×4): qty 1

## 2023-04-03 MED ORDER — GADOBUTROL 1 MMOL/ML IV SOLN
7.0000 mL | Freq: Once | INTRAVENOUS | Status: AC | PRN
  Administered 2023-04-03: 7 mL via INTRAVENOUS

## 2023-04-03 MED ORDER — ACETAMINOPHEN 325 MG PO TABS
650.0000 mg | ORAL_TABLET | Freq: Four times a day (QID) | ORAL | Status: DC | PRN
  Administered 2023-04-03 – 2023-04-04 (×4): 650 mg via ORAL
  Filled 2023-04-03 (×4): qty 2

## 2023-04-03 MED ORDER — GUAIFENESIN 100 MG/5ML PO LIQD
5.0000 mL | ORAL | Status: DC | PRN
  Administered 2023-04-03 – 2023-04-04 (×3): 5 mL via ORAL
  Filled 2023-04-03 (×3): qty 5

## 2023-04-03 MED ORDER — IOHEXOL 350 MG/ML SOLN
75.0000 mL | Freq: Once | INTRAVENOUS | Status: AC | PRN
  Administered 2023-04-03: 75 mL via INTRAVENOUS

## 2023-04-03 MED ORDER — ACETAMINOPHEN 650 MG RE SUPP: 650.0000 mg | Freq: Four times a day (QID) | RECTAL | Status: DC | PRN

## 2023-04-03 MED ORDER — DOFETILIDE 250 MCG PO CAPS
250.0000 ug | ORAL_CAPSULE | Freq: Two times a day (BID) | ORAL | Status: DC
  Administered 2023-04-03 – 2023-04-06 (×7): 250 ug via ORAL
  Filled 2023-04-03 (×9): qty 1

## 2023-04-03 NOTE — Progress Notes (Signed)
FMTS Brief Progress Note  S: Saw patient and his wife on rounds with Dr Elliot Gurney. Pt states he feels "so-so" today. Worked with physical therapy, says he still feels weak on his right side and feels sick due to his COVID infection. Still having nonproductive cough and some nasal congestion. Explained plan for tomorrow- review brain MRI and NSG to see pt. Pt and wife did not have further questions or concerns at this time.   O: BP 106/77 (BP Location: Left Arm)   Pulse (!) 51   Temp 99.3 F (37.4 C)   Resp 18   Ht 5\' 6"  (1.676 m)   Wt 62.3 kg   SpO2 99%   BMI 22.17 kg/m   Physical Exam Constitutional:      Appearance: Normal appearance.  Cardiovascular:     Rate and Rhythm: Normal rate and regular rhythm.     Heart sounds: Normal heart sounds.  Pulmonary:     Effort: Pulmonary effort is normal.     Breath sounds: Normal breath sounds.  Abdominal:     General: Abdomen is flat. Bowel sounds are normal.     Palpations: Abdomen is soft.  Neurological:     Mental Status: He is alert and oriented to person, place, and time.     Motor: Weakness present.     Comments: RUE 4/5 strength RLE 4/5 strength LUE and LLE 5/5 strength    A/P:  R-sided weakness Recent L sided subdural hematoma s/p burr hole procedure 03/27/23. CT head on admission stable from prior hospitalization. Pt instructed to discontinue Xarelto at that time- concern for possible stroke causing current neuro deficits -f/u brain MRI interpretation -NSG following, will consult pending MRI results -PT/OT eval and treat -fall precautions -AM CBC   COVID-19 infection Febrile on admission with WCC 11.8. Positive COVID test, presumed transmission from wife who was recently sick. Pt breathing well on room air. Most recent fever to 102 earlier this evening. -Tylenol PRN for fever -IVF PRN -routine vitals -droplet precautions -can consider Molnupiravir if worsening symptoms or vitals -AM CMP, Mg   Paroxysmal  Afib Intermittent Afib since admission. On home diltiazem and tikosyn. Xarelto discontinued during prior hospital admission due to subdural hematoma  -continuous cardiac monitoring -SCDs for DVT ppx  Lorayne Bender, MD 04/03/2023, 11:13 PM PGY-1, [facility] Family Medicine Night Resident  Please page [phone] with questions.

## 2023-04-03 NOTE — Progress Notes (Signed)
? ?  Inpatient Rehab Admissions Coordinator : ? ?Per therapy recommendations, patient was screened for CIR candidacy by   RN MSN.  At this time patient appears to be a potential candidate for CIR. I will place a rehab consult per protocol for full assessment. Please call me with any questions. ? ?  RN MSN ?Admissions Coordinator ?[phone] ?  ?

## 2023-04-03 NOTE — Assessment & Plan Note (Addendum)
Hx of A fib. Patient is tachycardiac this AM.  -Continuous cardiac monitoring -Continue home medication of diltiazem and Tikosyn - will order troponins and CT PE to rule out underlying cardiac cause for tachycardia

## 2023-04-03 NOTE — Assessment & Plan Note (Addendum)
History of subdural hematoma, s/p left burr hole procedure on 7/30.  CT head on admission stable from prior hospitalization. However patient does have right-sided weakness with muscle strength 3/5 on right upper and lower extremity.  Rest of physical exam was unremarkable.  Neurosurgery already consulted with plans to follow-up with the patient on 04/03/23, appreciate their recommendations. -Admit to progressive, Dr. Linwood Dibbles attending -Neurosurgery consulted, appreciate recs -Neurochecks every 4 hours -Fall precautions -SCDs for VTE prophylaxis -N.p.o. -PT/OT eval and treat

## 2023-04-03 NOTE — Assessment & Plan Note (Addendum)
Febrile on admission with leukocytosis at 11.8.  Stable COVID test on exam with known exposure from wife who is his caregiver.  Patient currently has normal work of breathing on room air with reassuring O2 sats. -As needed Tylenol for fever -Continue routine vitals per floor protocol -Droplet precaution in place -Will consider Molnupiravir

## 2023-04-03 NOTE — Evaluation (Signed)
Occupational Therapy Evaluation Patient Details Name: [name] MRN: [id number] DOB: 06/19/1946 Today's Date: 04/03/2023   History of Present Illness Pt is a 77 y.o. M who presents 04/02/2023 with progressive R sided weakness and confusion. Covid+ MRI pending. PMH: AAA, atrial fibrillation, CAD s/p CABG, SDH with burr hole 7/30.   Clinical Impression   Prior to previous admission, pt independent and working, has not been able to walk around a few days PTA. Pt lives with spouse. Pt currently needing min-total A for ADLs, mod A +2 for bed mobility and mod-max A +2 for transfers with 2 person HHA. Pt unable to step RLE and noted mild weakness/stiffness in RUE, pt reports he was able to feed self this AM but was fatiguing. Pt presenting with impairments listed below, will follow acutely. Patient will benefit from intensive inpatient follow up therapy, >3 hours/day to maximize safety/ind with ADLs/functional mobility.       Recommendations for follow up therapy are one component of a multi-disciplinary discharge planning process, led by the attending physician.  Recommendations may be updated based on patient status, additional functional criteria and insurance authorization.   Assistance Recommended at Discharge Frequent or constant Supervision/Assistance  Patient can return home with the following A little help with walking and/or transfers;A little help with bathing/dressing/bathroom;Assistance with cooking/housework;Direct supervision/assist for medications management;Direct supervision/assist for financial management;Assist for transportation;Help with stairs or ramp for entrance    Functional Status Assessment  Patient has had a recent decline in their functional status and demonstrates the ability to make significant improvements in function in a reasonable and predictable amount of time.  Equipment Recommendations  Other (comment) (defer)    Recommendations for Other Services Speech  consult;Rehab consult     Precautions / Restrictions Precautions Precautions: Fall Restrictions Weight Bearing Restrictions: No      Mobility Bed Mobility Overal bed mobility: Needs Assistance Bed Mobility: Supine to Sit, Sit to Supine     Supine to sit: Mod assist, +2 for physical assistance Sit to supine: Mod assist, +2 for physical assistance   General bed mobility comments: pt needed assist to initiate as well as to move RLE off EOB and mod A at trunk to rise. Mod A +2 for return to supine as well    Transfers Overall transfer level: Needs assistance Equipment used: 2 person hand held assist Transfers: Sit to/from Stand Sit to Stand: +2 physical assistance, Mod assist, Max assist           General transfer comment: mod A +2 for power up, very narrow BOS and pt keeping wt on L side      Balance Overall balance assessment: Needs assistance Sitting-balance support: Feet supported, No upper extremity supported Sitting balance-Leahy Scale: Poor Sitting balance - Comments: pt with R lean and LOB in all directions Postural control: Right lateral lean Standing balance support: During functional activity, Bilateral upper extremity supported Standing balance-Leahy Scale: Zero Standing balance comment: needed max A to maintain standing                           ADL either performed or assessed with clinical judgement   ADL Overall ADL's : Needs assistance/impaired Eating/Feeding: Minimal assistance;Sitting;Bed level   Grooming: Minimal assistance;Sitting;Bed level   Upper Body Bathing: Moderate assistance;Sitting   Lower Body Bathing: Moderate assistance;Sitting/lateral leans   Upper Body Dressing : Moderate assistance;Sitting   Lower Body Dressing: Moderate assistance;Sitting/lateral leans   Toilet Transfer: Moderate assistance;+2  for physical assistance;Maximal assistance   Toileting- Clothing Manipulation and Hygiene: Total assistance;Maximal  assistance Toileting - Clothing Manipulation Details (indicate cue type and reason): catheter/brief     Functional mobility during ADLs: Moderate assistance;+2 for physical assistance       Vision   Additional Comments: will further assess     Perception Perception Perception Tested?: No   Praxis Praxis Praxis tested?: Not tested    Pertinent Vitals/Pain Pain Assessment Pain Assessment: No/denies pain     Hand Dominance Right   Extremity/Trunk Assessment Upper Extremity Assessment Upper Extremity Assessment: Generalized weakness (mild RUE weakness, some increased stiffness noted with standing attempts, pt unable to decifer if weakness is worse/better than previous admission, 3/5 grossly, reports some coordination difficulty with self feeding this AM)   Lower Extremity Assessment Lower Extremity Assessment: Defer to PT evaluation RLE Deficits / Details: hip flex 3/5, knee ext 3/5, notable proprioceptive deficits when placing R foot on floor in standing RLE Sensation: decreased proprioception RLE Coordination: decreased gross motor;decreased fine motor LLE Deficits / Details: hip flex 3+/5, knee ext 3+/5 LLE Sensation: WNL LLE Coordination: WNL   Cervical / Trunk Assessment Cervical / Trunk Assessment: Kyphotic   Communication Communication Communication: Expressive difficulties (wordfinding difficulties)   Cognition Arousal/Alertness: Awake/alert Behavior During Therapy: WFL for tasks assessed/performed Overall Cognitive Status: Impaired/Different from baseline Area of Impairment: Awareness, Safety/judgement, Memory, Following commands, Problem solving, Orientation, Attention                 Orientation Level: Disoriented to, Situation, Time Current Attention Level: Sustained Memory: Decreased short-term memory Following Commands: Follows one step commands consistently, Follows one step commands with increased time Safety/Judgement: Decreased awareness of  deficits Awareness: Emergent Problem Solving: Slow processing, Decreased initiation, Difficulty sequencing, Requires verbal cues, Requires tactile cues General Comments: delayed responses, noted wordfinding difficulties     General Comments  SpO2 88% with poor wave pleth, increased to 90's with good wave pleth, pt with 2/4 DOE after standing attempt, needing to lay down.    Exercises     Shoulder Instructions      Home Living Family/patient expects to be discharged to:: Private residence Living Arrangements: Spouse/significant other;Children Available Help at Discharge: Family Type of Home: House Home Access: Stairs to enter Secretary/administrator of Steps: 3 Entrance Stairs-Rails: Left;Right Home Layout: One level     Bathroom Shower/Tub: Producer, television/film/video: Standard     Home Equipment: Grab bars - tub/shower;Shower seat - built Charity fundraiser (2 wheels)   Additional Comments: pt was working before last admission      Prior Functioning/Environment Prior Level of Function : Needs assist             Mobility Comments: was working before last admission, has needed a RW recently due to R sided weakness ADLs Comments: indep        OT Problem List: Decreased range of motion;Decreased strength;Decreased activity tolerance;Impaired balance (sitting and/or standing);Decreased cognition;Decreased safety awareness;Decreased coordination;Cardiopulmonary status limiting activity      OT Treatment/Interventions: Self-care/ADL training;Therapeutic exercise;Energy conservation;DME and/or AE instruction;Therapeutic activities;Patient/family education;Balance training    OT Goals(Current goals can be found in the care plan section) Acute Rehab OT Goals Patient Stated Goal: none stated OT Goal Formulation: With patient Time For Goal Achievement: 04/17/23 Potential to Achieve Goals: Good ADL Goals Pt Will Perform Upper Body Dressing: sitting;with min guard  assist Pt Will Perform Lower Body Dressing: with min guard assist;sitting/lateral leans;sit to/from stand Pt Will Transfer to  Toilet: with min guard assist;ambulating;regular height toilet Pt Will Perform Tub/Shower Transfer: Tub transfer;Shower transfer;with min assist;ambulating Additional ADL Goal #1: pt will perform bed mobility with min guard A in prep for ADLs  OT Frequency: Min 1X/week    Co-evaluation PT/OT/SLP Co-Evaluation/Treatment: Yes Reason for Co-Treatment: Complexity of the patient's impairments (multi-system involvement);Necessary to address cognition/behavior during functional activity;For patient/therapist safety PT goals addressed during session: Mobility/safety with mobility;Balance OT goals addressed during session: ADL's and self-care      AM-PAC OT "6 Clicks" Daily Activity     Outcome Measure Help from another person eating meals?: None Help from another person taking care of personal grooming?: A Little Help from another person toileting, which includes using toliet, bedpan, or urinal?: A Little Help from another person bathing (including washing, rinsing, drying)?: A Lot Help from another person to put on and taking off regular upper body clothing?: A Lot Help from another person to put on and taking off regular lower body clothing?: A Lot 6 Click Score: 16   End of Session Equipment Utilized During Treatment: Gait belt Nurse Communication: Mobility status (SpO2, BP)  Activity Tolerance: Patient tolerated treatment well Patient left: in bed;with call bell/phone within reach;with bed alarm set  OT Visit Diagnosis: Unsteadiness on feet (R26.81);Other abnormalities of gait and mobility (R26.89);Muscle weakness (generalized) (M62.81)                Time: 1610-9604 OT Time Calculation (min): 19 min Charges:  OT General Charges $OT Visit: 1 Visit OT Evaluation $OT Eval Moderate Complexity: 1 Mod   K, OTD, OTR/L SecureChat Preferred Acute  Rehab [phone]    K Koonce 04/03/2023, 5:05 PM

## 2023-04-03 NOTE — Assessment & Plan Note (Signed)
Patient with tachycardia and fever overnight. Received 500cc bolus with subsequent decrease in heart rate and tachycardia. CT PE showed mucus impaction in bronchial branches with severe COPD.  - given patients persistent tachycardia yesterday and fever overnight with history of COPD, will consider paxlovid -As needed Tylenol for fever -Continue routine vitals per floor protocol -Droplet precaution in place

## 2023-04-03 NOTE — Hospital Course (Addendum)
[name] is a 77 y.o.male with a history of paroxysmal A fib and recent subdural hematoma requiring burr holes (7/30) who was admitted to the [facility] Medicine Teaching Service at [facility] for R sided weakness and COVID-19 infection. His hospital course is detailed below:  R sided weakness History of a recent subdural hematoma he is status post left bur hole procedure on 7/30 and presenting with right-sided weakness.  CT head on admission was stable to prior hospitalization, MRI brain showed no acute changes.  Constantly tested COVID-positive.  CT PE negative for PE however did show severe COPD.  Troponins flat at 19 and 20.  Neurosurgery consulted and recommended further workup for middle meningeal artery embolization outpatient. Patient was stable on discharge to home with [facility] PT/OT from a weakness standpoint and to finish 5-day course of Decadron 6 mg for COVID.  Other chronic conditions were medically managed with home medications and formulary alternatives as necessary (HTN, paroxysmal A-fib)  PCP Follow-up Recommendations: Held patients amlodipine as patients SBP was slightly low to normotensive (96-107) without BP medication.  Consider restarting if necessary. Patient to follow up with Cardiology for watchman device given holding anticoagulation in setting of recent SDH- if not a candidate, consider anticoagulation and risks/benefits with patient Patient has history of COPD not on any medications. Had COVID during hospital stay and was given short course of steroids. Consider reevaluating patients need for treatment.

## 2023-04-03 NOTE — Assessment & Plan Note (Addendum)
Intermittently in AFIB since admission. On home medication of diltiazem and Tikosyn.  Given multiple falls in the past and recent subdural hematoma, Xarelto was discontinued .  -Continuous cardiac monitoring -Continue home medication of diltiazem and Tikosyn

## 2023-04-03 NOTE — ED Notes (Signed)
1st lactic normal, 2nd not needed

## 2023-04-03 NOTE — Consult Note (Signed)
Neurosurgery Consultation  Reason for Consult: Subdural hematoma Referring Physician: Rumball  CC: Global weakness / fever  HPI: This is a 77 y.o. man in whom I recently performed a burr hole evac of a SDH. He was doing well and regained ambulation post-op, but suddenly felt worse yesterday with fever, global weakness, some confusion, and some recurrence of his right sided weakness. . Still off his rivaroxaban from last hospitalization, no known new trauma. No aura, no tonic or clonic movements, improved today subjectively compared to yesterday, with which his wife agrees.    ROS: A 14 point ROS was performed and is negative except as noted in the HPI.   PMHx:  Past Medical History:  Diagnosis Date   AAA (abdominal aortic aneurysm) (HCC)    06/27/20 MRI: 3.0 cm infrarenal AAA, recommend 3 year follow-up   Anxiety    Atrial fibrillation (HCC)    Bilateral carotid artery disease (HCC) 07/21/2013   CAD (coronary artery disease) 2008   CABG   COPD (chronic obstructive pulmonary disease) (HCC)    Dysrhythmia    A-fib   Gastritis    Headache(784.0)    Hypertension    Visit for monitoring Tikosyn therapy 12/2015   FamHx:  Family History  Problem Relation Age of Onset   Diabetes Mother    Hypertension Mother    Heart attack Father    Sudden death Father    Hypertension Sister    Diabetes Sister    SocHx:  reports that he has quit smoking. He has quit using smokeless tobacco.  His smokeless tobacco use included chew. He reports that he does not drink alcohol and does not use drugs.  Exam: Vital signs in last 24 hours: Temp:  [98.3 F (36.8 C)-100.4 F (38 C)] 99.5 F (37.5 C) (08/06 0950) Pulse Rate:  [113-131] 121 (08/06 0950) Resp:  [17-24] 20 (08/06 0950) BP: (96-163)/(60-96) 109/74 (08/06 0950) SpO2:  [93 %-99 %] 96 % (08/06 0950) Weight:  [62.3 kg] 62.3 kg (08/06 1020) General: Awake, alert, cooperative, lying in bed in NAD Head: Normocephalic and atruamatic HEENT:  Neck supple Pulmonary: breathing room air comfortably, no evidence of increased work of breathing, +non-productive cough Cardiac: RRR Abdomen: S NT ND Extremities: Warm and well perfused x4 Neuro: AOx3, PERRL, EOMI, FS, speech fluent with normal content, intact repetition Strength 4+/5 x4, SILTx4, no drift, incision c/d/i   Assessment and Plan: 77 y.o. man s/p recent burr hole evac of SDH with new cough / fever, worsening R>L weakness. CTH personally reviewed, which shows stable post-op residual subdural.   -no acute neurosurgical intervention indicated at this time. He had enough decompression to recover function post-op with a very sudden worsening that isn't focal. I think this is more of a 'recrudescence' type picture due to his Covid infection, although obviously not using the strict definition of the term. Given this and his potential need for restarting anticoagulation in the future, I think the best neurosurgical plan of care is set him up for middle meningeal embolization, I discussed w/ Dr. Conchita Paris and he is going to see the patient to discuss. But this is not urgent.  -his exam for me is reassuring, it's where he was post-op when discharged. Stroke possible given his history but less likely to be the cause of his worsening given the improvement. If he has recurrent focal weakness or any speech reception / output issues and the MRI is negative, would proceed with EEG.  -please call with any concerns or  questions  Jadene Pierini, MD 04/03/23 12:23 PM [facility]

## 2023-04-03 NOTE — Progress Notes (Signed)
PT Cancellation Note  Patient Details Name: EMPEROR HIRTE MRN: [id number] DOB: 29-Nov-1945   Cancelled Treatment:    Reason Eval/Treat Not Completed: Patient at procedure or test/unavailable (has been at multiple tests today). Will check back later as time allows.   Lyanne Co, PT  Acute Rehab Services Secure chat preferred Office [phone]    Elyse Hsu 04/03/2023, 3:18 PM

## 2023-04-03 NOTE — Evaluation (Signed)
Physical Therapy Evaluation Patient Details Name: [name] MRN: [id number] DOB: 1945-09-12 Today's Date: 04/03/2023  History of Present Illness  Pt is a 77 y.o. M who presents 04/02/2023 with progressive R sided weakness and confusion. Covid+ MRI pending. PMH: AAA, atrial fibrillation, CAD s/p CABG, SDH with burr hole 7/30.  Clinical Impression  Pt admitted with above diagnosis. Per chart pt left hospital a week ago mobilizing with RW at supervision level and currently is much declined from that status. Eval limited by pt fatigue from multiple tests today. Pt with problem solving deficits and slowness of following commands. He needed mod A +2 to come to EOB and stand. He was unable to properly place RLE on floor and could not step to R even with max A. At this time patient will benefit from intensive inpatient follow up therapy, >3 hours/day. Pt currently with functional limitations due to the deficits listed below (see PT Problem List). Pt will benefit from acute skilled PT to increase their independence and safety with mobility to allow discharge.           If plan is discharge home, recommend the following: Assist for transportation;Help with stairs or ramp for entrance;Direct supervision/assist for medications management;Two people to help with walking and/or transfers;Two people to help with bathing/dressing/bathroom;Assistance with cooking/housework;Direct supervision/assist for financial management   Can travel by private vehicle        Equipment Recommendations Other (comment) (TBD)  Recommendations for Other Services  Rehab consult    Functional Status Assessment Patient has had a recent decline in their functional status and demonstrates the ability to make significant improvements in function in a reasonable and predictable amount of time.     Precautions / Restrictions Precautions Precautions: Fall Restrictions Weight Bearing Restrictions: No      Mobility  Bed  Mobility Overal bed mobility: Needs Assistance Bed Mobility: Supine to Sit, Sit to Supine     Supine to sit: Mod assist, +2 for physical assistance Sit to supine: Mod assist, +2 for physical assistance   General bed mobility comments: pt needed assist to initiate as well as to move RLE off EOB and mod A at trunk to rise. Mod A +2 for return to supine as well    Transfers Overall transfer level: Needs assistance Equipment used: 2 person hand held assist Transfers: Sit to/from Stand Sit to Stand: +2 physical assistance, Mod assist           General transfer comment: mod A +2 for power up, very narrow BOS and pt keeping wt on L side    Ambulation/Gait Ambulation/Gait assistance: Max assist, +2 physical assistance Gait Distance (Feet): 0 Feet Assistive device: 2 person hand held assist         General Gait Details: attempted sidesteps to R but pt unable to advance RLE or wt shift. Pt having trouble placing RLE on floor and keeping R knee flexed.  Stairs            Wheelchair Mobility     Tilt Bed    Modified Rankin (Stroke Patients Only) Modified Rankin (Stroke Patients Only) Pre-Morbid Rankin Score: No significant disability Modified Rankin: Severe disability     Balance Overall balance assessment: Needs assistance Sitting-balance support: Feet supported, No upper extremity supported Sitting balance-Leahy Scale: Poor Sitting balance - Comments: pt with R lean and LOB in all directions Postural control: Right lateral lean Standing balance support: During functional activity, Bilateral upper extremity supported Standing balance-Leahy Scale: Zero Standing  balance comment: needed max A to maintain standing                             Pertinent Vitals/Pain Pain Assessment Pain Assessment: No/denies pain    Home Living Family/patient expects to be discharged to:: Private residence Living Arrangements: Spouse/significant  other;Children Available Help at Discharge: Family Type of Home: House Home Access: Stairs to enter Entrance Stairs-Rails: Lawyer of Steps: 3   Home Layout: One level Home Equipment: Grab bars - tub/shower;Shower seat - built Charity fundraiser (2 wheels) Additional Comments: pt was working before last admission    Prior Function Prior Level of Function : Needs assist             Mobility Comments: was working before last admission, has needed a RW recently due to R sided weakness ADLs Comments: indep     Hand Dominance   Dominant Hand: Right    Extremity/Trunk Assessment   Upper Extremity Assessment Upper Extremity Assessment: Defer to OT evaluation    Lower Extremity Assessment Lower Extremity Assessment: RLE deficits/detail;LLE deficits/detail;Generalized weakness RLE Deficits / Details: hip flex 3/5, knee ext 3/5, notable proprioceptive deficits when placing R foot on floor in standing RLE Sensation: decreased proprioception RLE Coordination: decreased gross motor;decreased fine motor LLE Deficits / Details: hip flex 3+/5, knee ext 3+/5 LLE Sensation: WNL LLE Coordination: WNL    Cervical / Trunk Assessment Cervical / Trunk Assessment: Kyphotic  Communication   Communication: Expressive difficulties (mild word finding difficulties)  Cognition Arousal/Alertness: Awake/alert Behavior During Therapy: WFL for tasks assessed/performed Overall Cognitive Status: Impaired/Different from baseline Area of Impairment: Awareness, Safety/judgement, Memory, Following commands, Problem solving, Orientation, Attention                 Orientation Level: Disoriented to, Situation, Time Current Attention Level: Sustained Memory: Decreased short-term memory Following Commands: Follows one step commands consistently, Follows one step commands with increased time Safety/Judgement: Decreased awareness of deficits Awareness: Emergent Problem  Solving: Slow processing, Decreased initiation, Difficulty sequencing, Requires verbal cues, Requires tactile cues General Comments: pt with slow responses, poor awareness of last few days' events, following commands slowly, appears fatigues (has had multiple tests today)        General Comments General comments (skin integrity, edema, etc.): SPO2 low 90's on RA. Pt with runny nose and cough. Very fatigued    Exercises     Assessment/Plan    PT Assessment Patient needs continued PT services  PT Problem List Decreased balance;Decreased mobility;Decreased safety awareness;Decreased strength;Decreased activity tolerance;Decreased coordination;Decreased cognition;Decreased knowledge of use of DME;Decreased knowledge of precautions;Cardiopulmonary status limiting activity       PT Treatment Interventions DME instruction;Gait training;Stair training;Functional mobility training;Therapeutic activities;Therapeutic exercise;Balance training;Patient/family education;Neuromuscular re-education;Cognitive remediation    PT Goals (Current goals can be found in the Care Plan section)  Acute Rehab PT Goals Patient Stated Goal: go home, return to work PT Goal Formulation: With patient Time For Goal Achievement: 04/17/23 Potential to Achieve Goals: Good    Frequency Min 1X/week     Co-evaluation PT/OT/SLP Co-Evaluation/Treatment: Yes Reason for Co-Treatment: Complexity of the patient's impairments (multi-system involvement);Necessary to address cognition/behavior during functional activity;For patient/therapist safety PT goals addressed during session: Mobility/safety with mobility;Balance         AM-PAC PT "6 Clicks" Mobility  Outcome Measure Help needed turning from your back to your side while in a flat bed without using bedrails?: A Little Help needed moving from  lying on your back to sitting on the side of a flat bed without using bedrails?: A Lot Help needed moving to and from a bed to  a chair (including a wheelchair)?: A Lot Help needed standing up from a chair using your arms (e.g., wheelchair or bedside chair)?: Total Help needed to walk in hospital room?: Total Help needed climbing 3-5 steps with a railing? : Total 6 Click Score: 10    End of Session   Activity Tolerance: Patient limited by fatigue Patient left: in bed;with bed alarm set;with call bell/phone within reach Nurse Communication: Mobility status PT Visit Diagnosis: Unsteadiness on feet (R26.81);Muscle weakness (generalized) (M62.81);Hemiplegia and hemiparesis Hemiplegia - Right/Left: Right Hemiplegia - dominant/non-dominant: Dominant Hemiplegia - caused by: Unspecified    Time: 8657-8469 PT Time Calculation (min) (ACUTE ONLY): 23 min   Charges:   PT Evaluation $PT Eval Moderate Complexity: 1 Mod   PT General Charges $$ ACUTE PT VISIT: 1 Visit         Lyanne Co, PT  Acute Rehab Services Secure chat preferred Office [phone]   Lawana Chambers  04/03/2023, 4:53 PM

## 2023-04-03 NOTE — H&P (Signed)
Hospital Admission History and Physical Service Pager: [phone]  Patient name: MCARTHER MALLEY Medical record number: [id number] Date of Birth: [birth year] Age: 77 y.o. Gender: male  Primary Care Provider: Benita Stabile, MD Consultants: Neuro surgery  Code Status: Full code  Preferred Emergency Contact: Extended Emergency Contact Information Primary Emergency Contact: Niesen,Kay Address: [address]          [place], [place] [address] [place] Home Phone: [phone] Work Phone: [phone] Mobile Phone: [phone] Relation: Spouse   Chief Complaint: R-sided weakness  Assessment and Plan: SOCTT OBROCHTA is a 77 y.o. male presenting with worsening R side weakness . Differential for presentation of this includes worsening symptom from recent subdural hematoma or possible new ischemic infarct although less likely but considered given his history of AFIB.   [facility]     * (Principal) Subdural hematoma (HCC)     History of subdural hematoma, s/p left burr hole procedure on 7/30.  CT  head on admission stable from prior hospitalization. However patient does  have right-sided weakness with muscle strength 3/5 on right upper and  lower extremity.  Rest of physical exam was unremarkable.  Neurosurgery  already consulted with plans to follow-up with the patient on 04/03/23,  appreciate their recommendations. -Admit to progressive, Dr. Linwood Dibbles attending -Neurosurgery consulted, appreciate recs -Neurochecks every 4 hours -Fall precautions -SCDs for VTE prophylaxis -N.p.o. -PT/OT eval and treat        Paroxysmal atrial fibrillation (HCC)     Intermittently in AFIB since admission. On home medication of diltiazem  and Tikosyn.  Given multiple falls in the past and recent subdural  hematoma, Xarelto was discontinued .  -Continuous cardiac monitoring -Continue home medication of diltiazem and Tikosyn        COVID-19 virus infection     Febrile on  admission with leukocytosis at 11.8.  Stable COVID test on  exam with known exposure from wife who is his caregiver.  Patient  currently has normal work of breathing on room air with reassuring O2  sats. -As needed Tylenol for fever -Continue routine vitals per floor protocol -Droplet precaution in place -Will consider Molnupiravir        FEN/GI: NPO VTE Prophylaxis: SCDs  Disposition: Progressive   History of Present Illness:  CHASITY BLUE is a 77 y.o. male presenting with worsening R side weakness  Discharged from [facility] 5 days ago after admission for L SDH s/p burr hole by NSG. Was feeling well until 2 days ago when pt and his wife noticed some forgetfulness, confusion, and worsening weakness in his RUE and RLE.  Both patient and wife denies any recent fall since surgery and stated patient has been off his Xarelto since discharged from the hospital as advised. He denies any headache, chest pain or vision changes. However has reported some runny nose, cough, and fever up to 103 in the last day or two. Of note wife was recently sick  with similar viral presentation and tested positive for COVID-19 at Urgent care.  Recent Hospital Course: [facility] Seat 6/5 - Developed hematoma with R side weakness Had L burr hole evacuation procedure 7/30 D/ced in stable condition on 7/31 Recommended stopping xalreto  In the ED, Head CT was obtained which showed stable finding from prior hospitalization. Neurosurgery was consulted who recommended admission with plans to follow up with patient tomorrow morning. He was also found to be febrile to 100.3 and tachycardic.  Received 1x flagyl, vancomycin, and cefepime plus 1L LR. Found to be positive for COVID-19.  Review Of Systems: Per HPI with the following additions:  Pertinent Past Medical History: PAF CAD s/p CABG x5 COPD HTN Anxiety  AAA     Pertinent Past Surgical History: L burr hole evacuation of subdural hematoma 03/27/2023 CABG Lumbar  laminectomy Bilateral rotator cuff repair    Pertinent Social History: Tobacco use: Former 40 year smoker, quit >10 years ago Alcohol use: none Other Substance use: none Lives with wife at home Works as an Lobbyist   Pertinent Family History:  Remainder reviewed in history tab.   Important Outpatient Medications: Norvasc 5 mg Atorvastatin 40 mg  Diltiazem 30mg  q4h prn if hr>100  Tikosyn 250 mcg BID Lisinopril 20mg  daily Nitroglycerin 0.4mg  prn Potassium chloride 10 mEq  Remainder reviewed in medication history.   Objective: BP 114/74   Pulse (!) 115   Temp 100.3 F (37.9 C) (Oral)   Resp (!) 21   SpO2 97%  Exam: General: Alert, elderly male, NAD HEENT: Atraumatic, MMM, No sclera icterus CV:  Irregular Rhythm, no murmurs Pulm: CTAB, good WOB on RA, no crackles or wheezing Abd: Soft, no distension, no tenderness Skin: dry, warm Ext: No BLE edema, +2 Pedal and radial pulse. Psych: Pleasant, normal mood and appropriate response Neuro: ANO x 4, cranial nerve II to XII intact, normal muscle strength on left upper and lower extremities, 3/5 on right lower extremity and 4/5 on right upper extremity with sensation intact.   Labs:  CBC BMET  Recent Labs  Lab 04/03/23 0157  WBC 11.8*  HGB 12.4*  HCT 38.9*  PLT 227   Recent Labs  Lab 04/02/23 2044  NA 137  K 4.2  CL 102  CO2 24  BUN 18  CREATININE 1.26*  GLUCOSE 111*  CALCIUM 9.2     SARS coronavirus - positive EKG: Sinus atrial tachycardia without ST changes.   Imaging Studies Performed:  Chest x-ray IMPRESSION: 1. Minimal ill-defined streaky opacity at the left lung base, favor atelectasis, although pneumonia could have a similar appearance. 2. Chronic peribronchial thickening.  Head CT IMPRESSION: Stable chronic left subdural hematoma with maximal thickness 2.1 cm. When measured at the same level on prior study, this is stable. Gas within the subpleural space related to recent of Accu a shin.  Stable 5 mm left right midline shift.     Jerre Simon, MD 04/03/2023, 2:25 AM PGY-3, [facility]  FPTS Intern pager: [phone], text pages welcome Secure chat group [facility] Teaching Service

## 2023-04-03 NOTE — ED Notes (Signed)
ED TO INPATIENT HANDOFF REPORT  ED Nurse Name and Phone #: cori [phone]  S Name/Age/Gender [name] 77 y.o. male Room/Bed: 009C/009C  Code Status   Code Status: Full Code  Home/SNF/Other Home Patient oriented to: self, place, time, and situation Is this baseline? Yes   Triage Complete: Triage complete  Chief Complaint Subdural hematoma (HCC) [S06.5XAA]  Triage Note Patient arrives with from home with weakness in his legs and hands. Patent had brain surgery here at [facility] 3 days ago. Today the patient began having pain on the left side near the surgical site and progressive weakness in his legs and arms. States his right leg and arm are more weak. Denis falls, no LOC. Hx of Afib.    Allergies Allergies  Allergen Reactions   Doxycycline Shortness Of Breath    Also has to urinate more often   Nsaids Hypertension    Gastritis     Level of Care/Admitting Diagnosis ED Disposition     ED Disposition  Admit   Condition  --   Comment  Hospital Area: [facility] [[id number]]  Level of Care: Progressive [102]  Admit to Progressive based on following criteria: NEUROLOGICAL AND NEUROSURGICAL complex patients with significant risk of instability, who do not meet ICU criteria, yet require close observation or frequent assessment (< / = every 2 - 4 hours) with medical / nursing intervention.  May place patient in observation at [facility] or [facility] if equivalent level of care is available:: No  Covid Evaluation: Confirmed COVID Positive  Diagnosis: Subdural hematoma [facility]) [[id number]]  Admitting Physician: Caro Laroche [[id number]]  Attending Physician: Caro Laroche [[id number]]          B Medical/Surgery History Past Medical History:  Diagnosis Date   AAA (abdominal aortic aneurysm) (HCC)    06/27/20 MRI: 3.0 cm infrarenal AAA, recommend 3 year follow-up   Anxiety    Atrial fibrillation (HCC)    Bilateral carotid artery disease (HCC) 07/21/2013    CAD (coronary artery disease) 2008   CABG   COPD (chronic obstructive pulmonary disease) (HCC)    Dysrhythmia    A-fib   Gastritis    Headache(784.0)    Hypertension    Visit for monitoring Tikosyn therapy 12/2015   Past Surgical History:  Procedure Laterality Date   BURR HOLE Left 03/27/2023   Procedure: BURR HOLE EVASCUATION OF SUBDURAL HEMATOMA;  Surgeon: Jadene Pierini, MD;  Location: [facility] OR;  Service: Neurosurgery;  Laterality: Left;   CARDIOVERSION N/A 09/08/2015   Procedure: CARDIOVERSION;  Surgeon: Vesta Mixer, MD;  Location: [facility] ENDOSCOPY;  Service: Cardiovascular;  Laterality: N/A;   CORONARY ARTERY BYPASS GRAFT     EXCISION OF KELOID Left 10/08/2018   Procedure: EXCISION OF CHRONIC ABDOMINAL WALL WOUND;  Surgeon: Abigail Miyamoto, MD;  Location: [facility];  Service: General;  Laterality: Left;   INCISION AND DRAINAGE DEEP NECK ABSCESS     LUMBAR LAMINECTOMY/DECOMPRESSION MICRODISCECTOMY Right 08/09/2020   Procedure: Laminectomy and Foraminotomy - Lumbar three-Lumbar four - Lumbar four-Lumbar five- right;  Surgeon: Donalee Citrin, MD;  Location: [facility] OR;  Service: Neurosurgery;  Laterality: Right;   ROTATOR CUFF REPAIR Bilateral    TEE WITHOUT CARDIOVERSION N/A 08/02/2015   Procedure: TRANSESOPHAGEAL ECHOCARDIOGRAM (TEE);  Surgeon: Quintella Reichert, MD;  Location: [facility] ENDOSCOPY;  Service: Cardiovascular;  Laterality: N/A;   TEE WITHOUT CARDIOVERSION N/A 09/08/2015   Procedure: TRANSESOPHAGEAL ECHOCARDIOGRAM (TEE);  Surgeon: Vesta Mixer, MD;  Location: [facility]  ENDOSCOPY;  Service: Cardiovascular;  Laterality: N/A;     A IV Location/Drains/Wounds Patient Lines/Drains/Airways Status     Active Line/Drains/Airways     Name Placement date Placement time Site Days   Peripheral IV 04/02/23 20 G 1" Right Antecubital 04/02/23  2314  Antecubital  1            Intake/Output Last 24 hours  Intake/Output Summary (Last 24 hours) at 04/03/2023 0811 Last data filed at  04/03/2023 0219 Gross per 24 hour  Intake 250 ml  Output --  Net 250 ml    Labs/Imaging Results for orders placed or performed during the hospital encounter of 04/02/23 (from the past 48 hour(s))  CBC with Differential     Status: Abnormal   Collection Time: 04/02/23  8:44 PM  Result Value Ref Range   WBC 11.8 (H) 4.0 - 10.5 K/uL   RBC 4.64 4.22 - 5.81 MIL/uL   Hemoglobin 14.1 13.0 - 17.0 g/dL   HCT 30.8 65.7 - 84.6 %   MCV 97.2 80.0 - 100.0 fL   MCH 30.4 26.0 - 34.0 pg   MCHC 31.3 30.0 - 36.0 g/dL   RDW 96.2 95.2 - 84.1 %   Platelets 278 150 - 400 K/uL   nRBC 0.0 0.0 - 0.2 %   Neutrophils Relative % 77 %   Neutro Abs 9.1 (H) 1.7 - 7.7 K/uL   Lymphocytes Relative 14 %   Lymphs Abs 1.6 0.7 - 4.0 K/uL   Monocytes Relative 8 %   Monocytes Absolute 0.9 0.1 - 1.0 K/uL   Eosinophils Relative 1 %   Eosinophils Absolute 0.1 0.0 - 0.5 K/uL   Basophils Relative 0 %   Basophils Absolute 0.1 0.0 - 0.1 K/uL   Immature Granulocytes 0 %   Abs Immature Granulocytes 0.04 0.00 - 0.07 K/uL    Comment: Performed at [facility], [address] [place], [place] [address]  Comprehensive metabolic panel     Status: Abnormal   Collection Time: 04/02/23  8:44 PM  Result Value Ref Range   Sodium 137 135 - 145 mmol/L   Potassium 4.2 3.5 - 5.1 mmol/L   Chloride 102 98 - 111 mmol/L   CO2 24 22 - 32 mmol/L   Glucose, Bld 111 (H) 70 - 99 mg/dL    Comment: Glucose reference range applies only to samples taken after fasting for at least 8 hours.   BUN 18 8 - 23 mg/dL   Creatinine, Ser 1.02 (H) 0.61 - 1.24 mg/dL   Calcium 9.2 8.9 - 72.5 mg/dL   Total Protein 7.7 6.5 - 8.1 g/dL   Albumin 3.4 (L) 3.5 - 5.0 g/dL   AST 23 15 - 41 U/L   ALT 19 0 - 44 U/L   Alkaline Phosphatase 89 38 - 126 U/L   Total Bilirubin 0.9 0.3 - 1.2 mg/dL   GFR, Estimated 58 (L) >60 mL/min    Comment: (NOTE) Calculated using the CKD-EPI Creatinine Equation (2021)    Anion gap 11 5 - 15    Comment: Performed at [facility], [address] [place], [place] [address]  Protime-INR     Status: None   Collection Time: 04/02/23 10:59 PM  Result Value Ref Range   Prothrombin Time 13.8 11.4 - 15.2 seconds   INR 1.0 0.8 - 1.2    Comment: (NOTE) INR goal varies based on device and disease states. Performed at [facility], [address] [place], [place] [address]   APTT     Status: None   Collection Time: 04/02/23 10:59 PM  Result Value Ref Range   aPTT 33 24 - 36 seconds    Comment: Performed at [facility], [address], [place], [place] [address]  Urinalysis, Routine w reflex microscopic -Urine, Clean Catch     Status: Abnormal   Collection Time: 04/02/23 10:59 PM  Result Value Ref Range   Color, Urine YELLOW YELLOW   APPearance CLEAR CLEAR   Specific Gravity, Urine 1.020 1.005 - 1.030   pH 5.0 5.0 - 8.0   Glucose, UA NEGATIVE NEGATIVE mg/dL   Hgb urine dipstick SMALL (A) NEGATIVE   Bilirubin Urine NEGATIVE NEGATIVE   Ketones, ur NEGATIVE NEGATIVE mg/dL   Protein, ur 213 (A) NEGATIVE mg/dL   Nitrite NEGATIVE NEGATIVE   Leukocytes,Ua NEGATIVE NEGATIVE   RBC / HPF 0-5 0 - 5 RBC/hpf   WBC, UA 0-5 0 - 5 WBC/hpf   Bacteria, UA NONE SEEN NONE SEEN   Squamous Epithelial / HPF 0-5 0 - 5 /HPF   Mucus PRESENT    Hyaline Casts, UA PRESENT     Comment: Performed at [facility], [address], [place], [place] [address]  SARS Coronavirus 2 by RT PCR (hospital order, performed in [facility] hospital lab) *cepheid single result test* Anterior Nasal Swab     Status: Abnormal   Collection Time: 04/02/23 10:59 PM   Specimen: Anterior Nasal Swab  Result Value Ref Range   SARS Coronavirus 2 by RT PCR POSITIVE (A) NEGATIVE    Comment: Performed at [facility], [address] [place], [place] [address]  Culture, blood (single)     Status: None (Preliminary result)   Collection Time: 04/02/23 11:08 PM   Specimen: BLOOD  Result Value Ref Range   Specimen Description BLOOD  RIGHT ANTECUBITAL    Special Requests      BOTTLES DRAWN AEROBIC AND ANAEROBIC Blood Culture results may not be optimal due to an inadequate volume of blood received in culture bottles   Culture      NO GROWTH < 12 HOURS Performed at [facility], [address], [place], [place] [address]    Report Status PENDING   I-Stat CG4 Lactic Acid     Status: None   Collection Time: 04/02/23 11:23 PM  Result Value Ref Range   Lactic Acid, Venous 1.8 0.5 - 1.9 mmol/L  Basic metabolic panel     Status: Abnormal   Collection Time: 04/03/23  1:57 AM  Result Value Ref Range   Sodium 135 135 - 145 mmol/L   Potassium 3.9 3.5 - 5.1 mmol/L   Chloride 100 98 - 111 mmol/L   CO2 24 22 - 32 mmol/L   Glucose, Bld 116 (H) 70 - 99 mg/dL    Comment: Glucose reference range applies only to samples taken after fasting for at least 8 hours.   BUN 14 8 - 23 mg/dL   Creatinine, Ser 4.13 0.61 - 1.24 mg/dL   Calcium 8.6 (L) 8.9 - 10.3 mg/dL   GFR, Estimated >24 >40 mL/min    Comment: (NOTE) Calculated using the CKD-EPI Creatinine Equation (2021)    Anion gap 11 5 - 15    Comment: Performed at [facility], [address], [place], [place] [address]  CBC     Status: Abnormal   Collection Time: 04/03/23  1:57 AM  Result Value  Ref Range   WBC 11.8 (H) 4.0 - 10.5 K/uL   RBC 4.03 (L) 4.22 - 5.81 MIL/uL   Hemoglobin 12.4 (L) 13.0 - 17.0 g/dL   HCT 41.3 (L) 24.4 - 01.0 %   MCV 96.5 80.0 - 100.0 fL   MCH 30.8 26.0 - 34.0 pg   MCHC 31.9 30.0 - 36.0 g/dL   RDW 27.2 53.6 - 64.4 %   Platelets 227 150 - 400 K/uL   nRBC 0.0 0.0 - 0.2 %    Comment: Performed at [facility], [address], [place], [place] [address]   DG Chest Port 1 View  Result Date: 04/02/2023 CLINICAL DATA:  Cough. EXAM: PORTABLE CHEST 1 VIEW COMPARISON:  10/25/2020 FINDINGS: Prior median sternotomy. Stable heart size and mediastinal contours. Aortic atherosclerosis. Chronic peribronchial thickening. Minimal ill-defined streaky  opacity at the left lung base. No pleural fluid or pneumothorax. No acute osseous findings. IMPRESSION: 1. Minimal ill-defined streaky opacity at the left lung base, favor atelectasis, although pneumonia could have a similar appearance. 2. Chronic peribronchial thickening. Electronically Signed   By: Narda Rutherford M.D.   On: 04/02/2023 23:35   CT Head Wo Contrast  Result Date: 04/02/2023 CLINICAL DATA:  Recent subdural hematoma evacuation. Now with weakness. EXAM: CT HEAD WITHOUT CONTRAST TECHNIQUE: Contiguous axial images were obtained from the base of the skull through the vertex without intravenous contrast. RADIATION DOSE REDUCTION: This exam was performed according to the departmental dose-optimization program which includes automated exposure control, adjustment of the mA and/or kV according to patient size and/or use of iterative reconstruction technique. COMPARISON:  03/28/2023 FINDINGS: Brain: Again noted is the chronic left subdural hematoma with maximal thickness 2.1 cm. When measured at the same level on prior study, this is stable. Gas within the subdural space likely related to recent evacuation. 5 mm of left-to-right midline shift, stable. No new areas of hemorrhage or hydrocephalus. No acute infarction. Vascular: No hyperdense vessel or unexpected calcification. Skull: No acute calvarial abnormality. Left side burr hole from prior subdural evacuation. Sinuses/Orbits: No acute findings Other: None IMPRESSION: Stable chronic left subdural hematoma with maximal thickness 2.1 cm. When measured at the same level on prior study, this is stable. Gas within the subpleural space related to recent of Accu a shin. Stable 5 mm left right midline shift. Electronically Signed   By: Charlett Nose M.D.   On: 04/02/2023 21:09    Pending Labs Unresulted Labs (From admission, onward)    None       Vitals/Pain Today's Vitals   04/03/23 0745 04/03/23 0800 04/03/23 0805 04/03/23 0805  BP: 122/75 114/77     Pulse: (!) 117 (!) 119    Resp: (!) 22 (!) 21    Temp:   (!) 100.4 F (38 C)   TempSrc:   Oral   SpO2: 98% 96%    PainSc:    0-No pain    Isolation Precautions Droplet precaution  Medications Medications  lactated ringers infusion ( Intravenous New Bag/Given 04/03/23 0044)  amLODipine (NORVASC) tablet 5 mg (has no administration in time range)  diltiazem (CARDIZEM) tablet 30 mg (has no administration in time range)  dofetilide (TIKOSYN) capsule 250 mcg (250 mcg Oral Given 04/03/23 0419)  acetaminophen (TYLENOL) tablet 650 mg (has no administration in time range)    Or  acetaminophen (TYLENOL) suppository 650 mg (has no administration in time range)  lactated ringers bolus 1,000 mL (0 mLs Intravenous Stopped 04/03/23 0034)  acetaminophen (TYLENOL) tablet 1,000  mg (1,000 mg Oral Given 04/02/23 2257)  lactated ringers bolus 500 mL (0 mLs Intravenous Stopped 04/03/23 0204)  ceFEPIme (MAXIPIME) 2 g in sodium chloride 0.9 % 100 mL IVPB (0 g Intravenous Stopped 04/03/23 0047)  metroNIDAZOLE (FLAGYL) IVPB 500 mg (0 mg Intravenous Stopped 04/03/23 0047)  vancomycin (VANCOREADY) IVPB 1250 mg/250 mL (0 mg Intravenous Stopped 04/03/23 0219)  diltiazem (CARDIZEM) tablet 30 mg (30 mg Oral Given 04/03/23 0039)    Mobility walks with device     Focused Assessments Pulmonary Assessment Handoff:  Lung sounds:   O2 Device: Room Air      R Recommendations: See Admitting Provider Note  Report given to:   Additional Notes:  7 Days S/P burr hole for SDH. Presented with c/o headache and BUE weakness. Patient diagnosed with COVID.

## 2023-04-03 NOTE — Assessment & Plan Note (Addendum)
History of subdural hematoma, s/p left burr hole procedure on 7/30.  CT head on admission stable from prior hospitalization. Some concern for stroke as patients anticoagulation was stopped at last admission d/t fall.   -Neurosurgery consulted, appreciate recs -Neurochecks every 4 hours - MRI brain, will consult neuro if concerning for stroke  -Fall precautions -SCDs for VTE prophylaxis -PT/OT eval and treat

## 2023-04-03 NOTE — Progress Notes (Addendum)
FMTS Interim Progress Note  S: Patient resting with blankets in bed this morning.  He reports that he is "always cold."  He reports that he has been feeling sick the last few days.  He states that he had increased left-sided weakness over the past few days and his wife told him to come in.  Per his wife patient has had increased weakness on his right side and has been shuffling his feet again and she noticed that he is starting to become a little bit slower with his cognition.  O: BP 109/74 (BP Location: Left Arm)   Pulse (!) 121 Comment: RN notified  Temp 99.5 F (37.5 C) (Oral)   Resp 20   Ht 5\' 6"  (1.676 m)   Wt 62.3 kg   SpO2 96%   BMI 22.17 kg/m   Physical Exam Constitutional:      Appearance: Normal appearance.  Eyes:     Extraocular Movements: Extraocular movements intact.     Pupils: Pupils are equal, round, and reactive to light.  Cardiovascular:     Rate and Rhythm: Regular rhythm. Tachycardia present.     Pulses: Normal pulses.     Heart sounds: Normal heart sounds.  Pulmonary:     Effort: Pulmonary effort is normal.     Breath sounds: Normal breath sounds.  Abdominal:     General: Abdomen is flat. Bowel sounds are normal.     Palpations: Abdomen is soft.  Musculoskeletal:     Comments: Decreased strength (4/5) on RLE. Decreased strength (3/5) on REE. Shoulder shrug 3/5 on R side. Strength 5/5 on left side  Neurological:     Mental Status: He is alert.     A&P: Weakness to R side     History of subdural hematoma, s/p left burr hole procedure on 7/30.  CT  head on admission stable from prior hospitalization. Some concern for  stroke as patients anticoagulation was stopped at last admission d/t fall.    -Neurosurgery consulted, appreciate recs -Neurochecks every 4 hours - MRI brain, will consult neuro if concerning for stroke  -Fall precautions -SCDs for VTE prophylaxis -PT/OT eval and treat     Paroxysmal atrial fibrillation (HCC)     Hx of A fib.  Patient is tachycardiac this AM.  -Continuous cardiac monitoring -Continue home medication of diltiazem and Tikosyn - will order troponins and CT PE to rule out underlying cardiac cause for  tachycardia      COVID-19     Patient currently has normal work of breathing on room air with  reassuring O2 sats. Tachycardiac.  -As needed Tylenol for fever -Continue routine vitals per floor protocol -Droplet precaution in place   Penne Lash, MD 04/03/2023, 12:51 PM PGY-1, [facility] Family Medicine Service pager [phone]

## 2023-04-03 NOTE — Progress Notes (Addendum)
OT Cancellation Note  Patient Details Name: [name] MRN: [id number] DOB: 25-Mar-1946   Cancelled Treatment:    Reason Eval/Treat Not Completed: Medical issues which prohibited therapy (RN reporting pt with elevated HR, requesting therapy come back this PM for evaluation. Will follow up as schedule permits)  Addendum 1519: Pt off floor for CT  [name], OTD, OTR/L [facility] [phone]   [name] 04/03/2023, 12:09 PM

## 2023-04-04 ENCOUNTER — Ambulatory Visit: Payer: Medicare Other | Admitting: Physician Assistant

## 2023-04-04 ENCOUNTER — Other Ambulatory Visit (HOSPITAL_COMMUNITY): Payer: Self-pay

## 2023-04-04 MED ORDER — DEXAMETHASONE 6 MG PO TABS
6.0000 mg | ORAL_TABLET | Freq: Every day | ORAL | 0 refills | Status: AC
  Filled 2023-04-04 – 2023-04-06 (×2): qty 4, 4d supply, fill #0

## 2023-04-04 MED ORDER — ACETAMINOPHEN 500 MG PO TABS
500.0000 mg | ORAL_TABLET | Freq: Four times a day (QID) | ORAL | 0 refills | Status: AC
  Filled 2023-04-04 – 2023-04-06 (×2): qty 100, 25d supply, fill #0

## 2023-04-04 MED ORDER — ENOXAPARIN SODIUM 40 MG/0.4ML IJ SOSY
40.0000 mg | PREFILLED_SYRINGE | INTRAMUSCULAR | Status: DC
  Administered 2023-04-04 – 2023-04-05 (×2): 40 mg via SUBCUTANEOUS
  Filled 2023-04-04 (×2): qty 0.4

## 2023-04-04 MED ORDER — DEXAMETHASONE 4 MG PO TABS
6.0000 mg | ORAL_TABLET | Freq: Every day | ORAL | Status: DC
  Administered 2023-04-04 – 2023-04-06 (×3): 6 mg via ORAL
  Filled 2023-04-04 (×3): qty 1

## 2023-04-04 NOTE — Progress Notes (Signed)
Physical Therapy Treatment Patient Details Name: [name] MRN: [id number] DOB: Jul 25, 1946 Today's Date: 04/04/2023   History of Present Illness Pt is a 77 y.o. M who presents 04/02/2023 with progressive R sided weakness and confusion. Covid+ MRI shows stable SDH. Plan for MMA embolization 8/20. PMH: AAA, atrial fibrillation, CAD s/p CABG, SDH with burr hole 7/30.    PT Comments  Pt is demonstrating good progress with functional mobility, advancing to OOB mobility and taking steps today. However, pt displays poor attention to his R extremities, needing repeated cues to look at them and move them along with his body when he would perform bed mobility or ambulate. He displays fair carryover of repeated cues. At this time, pt is requiring min-modA for bed mobility and transfers and modA to ambulate short distances within the room with a RW for support. Pt limited in progressing gait distances further by his elevated HR. Will continue to follow acutely.      If plan is discharge home, recommend the following: Assist for transportation;Help with stairs or ramp for entrance;Direct supervision/assist for medications management;Two people to help with walking and/or transfers;Two people to help with bathing/dressing/bathroom;Assistance with cooking/housework;Direct supervision/assist for financial management   Can travel by private vehicle        Equipment Recommendations  Other (comment) (TBD)    Recommendations for Other Services Rehab consult     Precautions / Restrictions Precautions Precautions: Fall Precaution Comments: watch HR Restrictions Weight Bearing Restrictions: No     Mobility  Bed Mobility Overal bed mobility: Needs Assistance Bed Mobility: Supine to Sit, Sit to Supine     Supine to sit: Min assist, HOB elevated, Used rails Sit to supine: Mod assist, HOB elevated   General bed mobility comments: Pt trying to ascend trunk to sit L EOB before mobilizing his R leg  towards L EOB, needing repeated cues to attend to it and get it off the side of the bed before sitting up and minA with cues for pt to pull up on therapist's hand with his R UE to sit up. ModA to lift R leg and direct trunk to supine with pt trying to lift L leg over his R to get it on the bed initially.    Transfers Overall transfer level: Needs assistance Equipment used: Rolling walker (2 wheels) Transfers: Sit to/from Stand Sit to Stand: Mod assist, Min assist           General transfer comment: Cues provided to push up from bed with L UE and R UE on the RW, fair carryover with subsequent reps. ModA the first rep to shift weight anteriorly and gain balance. Progressed quickly to minA with subsequent x2 reps.    Ambulation/Gait Ambulation/Gait assistance: Mod assist Gait Distance (Feet): 6 Feet (x3 bouts of ~5-6 ft each rep) Assistive device: Rolling walker (2 wheels) Gait Pattern/deviations: Step-to pattern, Decreased step length - right, Decreased step length - left, Decreased stride length, Decreased dorsiflexion - right, Decreased weight shift to left Gait velocity: decreased Gait velocity interpretation: <1.31 ft/sec, indicative of household ambulator   General Gait Details: Pt initially did not attend to his R leg and needed max verbal and tactile cues to look at it and pick it up to step. Decreased cues needed with subsequent reps and pt demonstrated fair carryover of picking up his R foot and advancing it. Needs repeated cues to slide his RW posteriorly as he stepped backwards back towards his bed. ModA for balance and RW management.  Stairs             Wheelchair Mobility     Tilt Bed    Modified Rankin (Stroke Patients Only) Modified Rankin (Stroke Patients Only) Pre-Morbid Rankin Score: No significant disability Modified Rankin: Moderately severe disability     Balance Overall balance assessment: Needs assistance Sitting-balance support: Feet supported,  No upper extremity supported Sitting balance-Leahy Scale: Fair Sitting balance - Comments: pt with R lean initially but progressed to midline with verbal cues Postural control: Right lateral lean Standing balance support: During functional activity, Bilateral upper extremity supported Standing balance-Leahy Scale: Poor Standing balance comment: Reliant on RW and min-modA for standing balance                            Cognition Arousal: Alert Behavior During Therapy: WFL for tasks assessed/performed Overall Cognitive Status: Impaired/Different from baseline Area of Impairment: Awareness, Safety/judgement, Memory, Following commands, Problem solving, Attention                   Current Attention Level: Selective Memory: Decreased short-term memory Following Commands: Follows one step commands consistently, Follows one step commands with increased time, Follows multi-step commands inconsistently, Follows multi-step commands with increased time Safety/Judgement: Decreased awareness of deficits, Decreased awareness of safety Awareness: Emergent Problem Solving: Slow processing, Decreased initiation, Difficulty sequencing, Requires verbal cues, Requires tactile cues General Comments: Pt demonstrates poor attention to R side, needing repeated cues to look at it and not leave an extremity behind while mobilization. Fair recall and carryover of cues for hand placement with transfers and sequencing feet and RW when stepping, but needs reminders at times. Slow processing of simple one step cues.        Exercises      General Comments General comments (skin integrity, edema, etc.): HR varied greatly from 120s up to 153, reports hx of afib, RN aware prior to session of it fluctuating      Pertinent Vitals/Pain Pain Assessment Pain Assessment: Faces Faces Pain Scale: No hurt Pain Intervention(s): Monitored during session    Home Living                           Prior Function            PT Goals (current goals can now be found in the care plan section) Acute Rehab PT Goals Patient Stated Goal: to improve and go to rehab PT Goal Formulation: With patient/family Time For Goal Achievement: 04/17/23 Potential to Achieve Goals: Good Progress towards PT goals: Progressing toward goals    Frequency    Min 1X/week      PT Plan      Co-evaluation              AM-PAC PT "6 Clicks" Mobility   Outcome Measure  Help needed turning from your back to your side while in a flat bed without using bedrails?: A Little Help needed moving from lying on your back to sitting on the side of a flat bed without using bedrails?: A Little Help needed moving to and from a bed to a chair (including a wheelchair)?: A Lot Help needed standing up from a chair using your arms (e.g., wheelchair or bedside chair)?: A Lot Help needed to walk in hospital room?: Total Help needed climbing 3-5 steps with a railing? : Total 6 Click Score: 12    End of Session Equipment  Utilized During Treatment: Gait belt Activity Tolerance: Patient tolerated treatment well Patient left: in bed;with bed alarm set;with call bell/phone within reach;with family/visitor present   PT Visit Diagnosis: Unsteadiness on feet (R26.81);Muscle weakness (generalized) (M62.81);Hemiplegia and hemiparesis;Other abnormalities of gait and mobility (R26.89);Difficulty in walking, not elsewhere classified (R26.2);Other symptoms and signs involving the nervous system (R29.898) Hemiplegia - Right/Left: Right Hemiplegia - dominant/non-dominant: Dominant Hemiplegia - caused by: Unspecified     Time: 1610-9604 PT Time Calculation (min) (ACUTE ONLY): 26 min  Charges:    $Gait Training: 8-22 mins $Therapeutic Activity: 8-22 mins PT General Charges $$ ACUTE PT VISIT: 1 Visit                     [name], PT, DPT Acute Rehabilitation Services  Office: [phone]    Jewel Baize 04/04/2023, 4:42 PM

## 2023-04-04 NOTE — Progress Notes (Signed)
Daily Progress Note Intern Pager: [phone]  Patient name: [name] Medical record number: [id number] Date of birth: 1945-09-12 Age: 77 y.o. Gender: male  Primary Care Provider: Benita Stabile, MD Consultants: Neurosurgery  Code Status: Full   Pt Overview and Major Events to Date:  8/6- admitted   Assessment and Plan: [name] is a 77 year old male with past medical history of A-fib, recent subdural hematoma status post bur hole decompression on 7/30 admitted for worsening right-sided weakness and confusion found to be COVID-positive.  *R sided Weakness*     History of subdural hematoma, s/p left burr hole procedure on 7/30.  CT  head on admission stable from prior hospitalization. MRI brain did not  show any acute changes. Neurosurgery recommended possible middle meningeal  artery embolization. PT/OT recommended CIR, patient would like to go home and rehab with HHPT.  -Neurochecks every 4 hours -Fall precautions -SCDs for VTE prophylaxis -PT/OT following   COVID-19     Patient with tachycardia and fever overnight. Received 500cc bolus with  subsequent decrease in heart rate and tachycardia. CT PE showed mucus  impaction in bronchial branches with severe COPD.  - given patients persistent tachycardia yesterday and fever overnight with  history of COPD, will consider paxlovid -As needed Tylenol for fever -Continue routine vitals per floor protocol -Droplet precaution in place  Chronic and stable: A fib: Hx of A fib, patient was tachycardic for most of yesterday but doing  better overnight after 500cc bolus. CT PE negative for PE. Troponins  20>>19.  -Continuous cardiac monitoring -Continue home medication of diltiazem and Tikosyn   FEN/GI: Full PPx: SCDs Dispo:PT/OT recommended CIR- will discuss with patient today   Subjective:  Patient reports he is doing well this morning. He reports he still has some trouble breathing and is coughing but he is feeling okay.  He states he isnt really up and moving yet. He reports feeling about the same as yesterday. No other complaints at this time.  Objective: Temp:  [98.1 F (36.7 C)-102.3 F (39.1 C)] 98.7 F (37.1 C) (08/07 1144) Pulse Rate:  [51-117] 96 (08/07 1144) Resp:  [16-20] 17 (08/07 1144) BP: (104-136)/(57-97) 107/70 (08/07 1144) SpO2:  [92 %-99 %] 93 % (08/07 1144) Physical Exam: General: coughing, resting in bed, NAD Cardiovascular: Irregularly irregular, no M/R/G Respiratory: expiratory wheezing on bilateral lung apices, non-productive cough, NWOB on RA Abdomen: soft, nontender, nondistended  Extremities: right sided weakness, unchanged from prior exam. 4/5 strength RUE with no pronator drift, 3/5 RLE  Laboratory: Most recent CBC Lab Results  Component Value Date   WBC 11.4 (H) 04/04/2023   HGB 13.1 04/04/2023   HCT 40.1 04/04/2023   MCV 96.2 04/04/2023   PLT 212 04/04/2023   Most recent BMP    Latest Ref Rng & Units 04/04/2023    7:51 AM  BMP  Glucose 70 - 99 mg/dL 295   BUN 8 - 23 mg/dL 16   Creatinine 6.21 - 1.24 mg/dL 3.08   Sodium 657 - 846 mmol/L 134   Potassium 3.5 - 5.1 mmol/L 4.5   Chloride 98 - 111 mmol/L 94   CO2 22 - 32 mmol/L 26   Calcium 8.9 - 10.3 mg/dL 8.9     Imaging/Diagnostic Tests: MRI:  IMPRESSION: 1. Unchanged size of 21 mm left convexity extra-axial collection 2. Unchanged 5 mm rightward midline shift.  CT PE:   IMPRESSION: Severe COPD. There is no evidence of pulmonary artery embolism. Aortic atherosclerosis.  Coronary artery calcifications are seen. Previous coronary bypass surgery.   Possible mucus impaction in bronchial branches in left lower lobe. Small linear patchy densities in posterior left lower lung fields suggest scarring or atelectasis. There is no focal pulmonary consolidation. There is no pleural effusion or pneumothorax.   Small hiatal hernia. Georg Ruddle , MD 04/04/2023, 1:38 PM  PGY-1, [facility] Family Medicine FPTS  Intern pager: [phone], text pages welcome Secure chat group [facility] Teaching Service

## 2023-04-04 NOTE — Assessment & Plan Note (Signed)
History of subdural hematoma, s/p left burr hole procedure on 7/30.  CT head on admission stable from prior hospitalization. MRI brain did not show any acute changes. Neurosurgery recommended possible middle meningeal artery embolization. PT/OT recommended CIR  -Neurochecks every 4 hours -Fall precautions -SCDs for VTE prophylaxis -PT/OT following

## 2023-04-04 NOTE — Progress Notes (Signed)
  Inpatient Rehabilitation Admissions Coordinator   Met with patient, spouse and niece at bedside for rehab assessment. We discussed goals and expectations of a possible CIR admit. Wife states it is up to her husband of whether to go home or to CIR. I gave time and opportunity for patient to answer, but he did not. Wife spoke for him and stated that her son and herself would be there with him . I questioned if he was able to make that decision . Wife states the doctor told her they were making arrangements for him to go home today. I encouraged wife and niece to discuss with other family members .Please call me with any questions.   Ottie Glazier, RN, MSN Rehab Admissions Coordinator [phone]  I received a call from wife who was calling outside of the room. She states her husband now is in agreement to possible CIR admit. I told her that he needed to agree with others at bedside, not her calling caregivers once she was outside of the room.   Noted discharge summary placed in preparation for him to go home. CIR bed not available to admit him to today. I will alert acute team and TOC.  Ottie Glazier, RN, MSN Rehab Admissions Coordinator [phone] 04/04/2023 12:41 PM

## 2023-04-04 NOTE — Progress Notes (Signed)
MRI reviewed, no concerning findings, shows stable subdural, no diffusion abnormality in the collection. GRE is minimal which suggests no new hemorrhage, not atypical to have low SWI/GRE in chronic collections. Discussed with Dr. Conchita Paris who agrees he's a good candidate for MMA embolization. There is no OR time to do this until 8/20, has an appointment with Dr. Conchita Paris in clinic to re-discuss the procedure. He can call [phone] to change the appointment as needed. As I discussed with the patient and his wife, strength will likely improve with resolution of the Covid symptoms. Will need to stay off therapeutic anticoagulation until the MMA embolization is done, but in the meantime I think the benefits outweight the risks of prophylactic anticoagulation so I would recommend starting that.

## 2023-04-04 NOTE — Discharge Summary (Deleted)
Family Medicine Teaching [facility] Discharge Summary  Patient name: [name] Medical record number: [id number] Date of birth: 01-Jun-1946 Age: 77 y.o. Gender: male Date of Admission: 04/02/2023  Date of Discharge: 04/04/23  Admitting Physician: Shelby Mattocks, DO  Primary Care Provider: Benita Stabile, MD Consultants: Neurosurgery   Indication for Hospitalization: Worsening right sided weakness s/p burr hole procedure 7/30 and COVID +   Brief Hospital Course:   [name] is a 77 y.o.male with a history of paroxysmal A fib and recent subdural hematoma requiring burr holes (7/30) who was admitted to the [facility] Medicine Teaching Service at [facility] for R sided weakness and COVID-19 infection. His hospital course is detailed below:  R sided weakness Patient has a history of a recent subdural hematoma he is status post left bur hole procedure on 7/30.  CT head on admission was stable to prior hospitalization however there was some concern for stroke as patient's anticoagulation was stopped on last admission due to hematoma. MRI brain was conducted due to concern of a stroke which showed no acute changes. Neurosurgery was consulted who had no recommendations while patient is in the hospital, however do recommend further workup for middle meningeal artery embolization outpatient. Patient was stable on discharge.   Paroxysmal A fib Patient with history of A-fib mom managed on Tikosyn with diltiazem as needed.  Patient tachycardic on admission and remained tachycardic to the 110s and 120s.  Some concern that patient may have PE due to not being on anticoagulation, CT PE negative for a PE but did show severe COPD with some mucus impactions.  Troponins were obtained as well which were flat (19>>20).  COVID Patient tested positive for COVID 19 infection. Given patients hx of COPD and increased cough and some DOE, patient was started on decadron 6mg  for 5 days.   Other chronic conditions were  medically managed with home medications and formulary alternatives as necessary (HTN)  PCP Follow-up Recommendations: Held patients amlodipine as patients SBP was slightly low to normotensive (96-107) without BP medication. Patient to follow up with Cardiology for watchman device given holding anticoagulation in setting of recent SDH- if not a candidate, consider anticoagulation and risks/benefits with patient Patient has history of COPD not on any medications. Had COVID during hospital stay and was given short course of steroids. Consider reevaluating patients need for treatment.    Discharge Diagnoses/Problem List:  Principal Problem:   Weakness Active Problems:   Paroxysmal atrial fibrillation (HCC)   COVID-19  Disposition: Home  Discharge Condition: Stable  Discharge Exam:  General: coughing, resting in bed, NAD Cardiovascular: Irregularly irregular, no M/R/G Respiratory: expiratory wheezing on bilateral lung apices, non-productive cough, NWOB on RA Abdomen: soft, nontender, nondistended  Extremities: right sided weakness, unchanged from prior exam. 4/5 strength RUE with no pronator drift, 3/5 RLE  Significant Procedures:  None   Significant Labs and Imaging:  Recent Labs  Lab 04/02/23 2044 04/03/23 0157 04/04/23 0751  WBC 11.8* 11.8* 11.4*  HGB 14.1 12.4* 13.1  HCT 45.1 38.9* 40.1  PLT 278 227 212   Recent Labs  Lab 04/02/23 2044 04/03/23 0157 04/04/23 0751  NA 137 135 134*  K 4.2 3.9 4.5  CL 102 100 94*  CO2 24 24 26   GLUCOSE 111* 116* 107*  BUN 18 14 16   CREATININE 1.26* 1.13 1.27*  CALCIUM 9.2 8.6* 8.9  MG  --   --  2.0  ALKPHOS 89  --   --   AST  23  --   --   ALT 19  --   --   ALBUMIN 3.4*  --   --     MRI: IMPRESSION: 1. Unchanged size of 21 mm left convexity extra-axial collection 2. Unchanged 5 mm rightward midline shift.   CT PE:   IMPRESSION: Severe COPD. There is no evidence of pulmonary artery embolism. Aortic atherosclerosis.  Coronary artery calcifications are seen. Previous coronary bypass surgery.   Possible mucus impaction in bronchial branches in left lower lobe. Small linear patchy densities in posterior left lower lung fields suggest scarring or atelectasis. There is no focal pulmonary consolidation. There is no pleural effusion or pneumothorax.   Small hiatal hernia  Results/Tests Pending at Time of Discharge: None  Discharge Medications:  Allergies as of 04/04/2023       Reactions   Doxycycline Shortness Of Breath   Also has to urinate more often   Nsaids Hypertension   Gastritis         Medication List     STOP taking these medications    amLODipine 5 MG tablet Commonly known as: NORVASC       TAKE these medications    acetaminophen 500 MG tablet Commonly known as: TYLENOL Take 1 tablet (500 mg total) by mouth every 6 (six) hours for 8 days then as directed by MD What changed:  how much to take when to take this reasons to take this   ALPRAZolam 0.25 MG tablet Commonly known as: XANAX TAKE 1 TABLET(0.25 MG) BY MOUTH DAILY What changed: See the new instructions.   atorvastatin 40 MG tablet Commonly known as: LIPITOR TAKE 1 TABLET(40 MG) BY MOUTH DAILY What changed:  how much to take how to take this when to take this additional instructions   dexamethasone 6 MG tablet Commonly known as: DECADRON Take 1 tablet (6 mg total) by mouth daily for 4 days.   diltiazem 30 MG tablet Commonly known as: Cardizem Take 1 tablet every 4 hours AS NEEDED for heart rate >100 as long as blood pressure >100. What changed:  how much to take how to take this when to take this   dofetilide 250 MCG capsule Commonly known as: TIKOSYN Take 1 capsule (250 mcg total) by mouth 2 (two) times daily.   Fish Oil 1000 MG Caps Take 1,000 mg by mouth daily.   lisinopril 20 MG tablet Commonly known as: ZESTRIL TAKE 1 TABLET(20 MG) BY MOUTH DAILY What changed:  how much to take how to  take this when to take this   multivitamin with minerals tablet Take 1 tablet by mouth at bedtime.   nitroGLYCERIN 0.4 MG SL tablet Commonly known as: NITROSTAT Place 1 tablet (0.4 mg total) under the tongue every 5 (five) minutes as needed for chest pain.   potassium chloride 10 MEQ tablet Commonly known as: KLOR-CON TAKE 1 TABLET(10 MEQ) BY MOUTH DAILY What changed:  how much to take how to take this when to take this additional instructions   Vitamin C 500 MG Chew Chew 500 mg by mouth daily.        Discharge Instructions: Please refer to Patient Instructions section of EMR for full details.  Patient was counseled important signs and symptoms that should prompt return to medical care, changes in medications, dietary instructions, activity restrictions, and follow up appointments.   Follow-Up Appointments:  Follow-up Information     Lisbeth Renshaw, MD Follow up.   Specialty: Neurosurgery Contact information: [address]  [address] [place] [address] [phone]         Benita Stabile, MD Follow up.   Specialty: Internal Medicine Contact information: [address] [place] [facility] [address] [phone]                 Penne Lash, MD 04/04/2023, 12:28 PM PGY-1, [facility] Family Medicine

## 2023-04-04 NOTE — Discharge Instructions (Signed)
Dear [name],  Thank you for letting us participate in your care. You were hospitalized for increased right sided weakness and feeling sick and diagnosed with COVID. You were treated with a short duration of steroids and follow up with neurosurgery outpatient regarding middle meningeal artery embolization.   POST-HOSPITAL & CARE INSTRUCTIONS Please meet with your cardiologist regarding management of your A fib. Since you are off the anticoagulation.  Please follow up with neurosurgery regarding your embolization We stopped your amlodipine since your Bps were normal- low during this hospital stay. Please follow up with PCP before starting back.  Go to your follow up appointments (listed below)   DOCTOR'S APPOINTMENT   Future Appointments  Date Time Provider Department Center  04/24/2023  1:55 PM Beatrice Lecher, [place] [facility]  05/01/2023  1:30 PM Danice Goltz, PA [facility] None    Follow-up Information     Lisbeth Renshaw, MD Follow up.   Specialty: Neurosurgery Contact information: [address] [place] [address] [phone]         Benita Stabile, MD Follow up.   Specialty: Internal Medicine Contact information: [address] [place] [address] [phone]                 Take care and be well!  Family Medicine Teaching Service Inpatient Team [place]  [facility]  [address] [place], [place] [address] [phone]

## 2023-04-05 ENCOUNTER — Other Ambulatory Visit (HOSPITAL_COMMUNITY): Payer: Self-pay

## 2023-04-05 NOTE — Progress Notes (Addendum)
Inpatient Rehabilitation Admissions Coordinator   I met with patient and his spouse at bedside. I again discussed a possible CIR admit and goals. He is in agreement to admit.  I feel he would benefit from 5 to 7 days of therapy before d/c home.I do not have a bed available for his admission today. I will follow up tomorrow to verify IF a bed will be available in the next 24 to 48 hrs.  Ottie Glazier, RN, MSN Rehab Admissions Coordinator [phone] 04/05/2023 1:06 PM

## 2023-04-05 NOTE — Progress Notes (Signed)
     Daily Progress Note Intern Pager: [phone]  Patient name: [name] Medical record number: [id number] Date of birth: 06/07/1946 Age: 77 y.o. Gender: male  Primary Care Provider: Benita Stabile, MD Consultants: Neurosurgery  Code Status: Full  Pt Overview and Major Events to Date:  8/6 - admitted  8/7 - PT/OT recommended CIR   Assessment and Plan: [name] is a 77 year old male with past medical history of A-fib, recent subdural hematoma status post bur hole decompression on 7/30 admitted for worsening right-sided weakness and confusion found to be COVID-positive.   [facility]     * (Principal) Right sided weakness     History of subdural hematoma, s/p left burr hole procedure on 7/30.  CT  head on admission stable from prior hospitalization. MRI brain did not  show any acute changes. Neurosurgery recommended possible middle meningeal  artery embolization. PT/OT recommended CIR  - awaiting CIR authorization  - Fall precautions -PT/OT following        COVID-19     Patient remained afebrile overnight.  - Decadron 6mg  for 5 days (8/7- ) -As needed Tylenol for fever -Continue routine vitals per floor protocol -Droplet precaution in place     Chronic and stable  A fib- -Continue home medication of diltiazem and Tikosyn - vital signs per floor   FEN/GI: Regular PPx: Lovenox Dispo:CIR pending auth  Subjective:  Patient reports he is doing okay this AM. He states the cough is better and he can breath pretty good. He states the neurosurgeon recommended CIR and he feels this would be really good for him. Patient denies chest pain or palpitations. No other complaints at this time.   Objective: Temp:  [98.1 F (36.7 C)-100.4 F (38 C)] 98.6 F (37 C) (08/08 0337) Pulse Rate:  [80-122] 80 (08/08 0337) Resp:  [15-18] 16 (08/08 0337) BP: (99-118)/(70-81) 107/81 (08/08 0337) SpO2:  [91 %-99 %] 99 % (08/08 0337) Physical Exam: General: NAD, resting comfortably  in bed Cardiovascular: Irregularly irregular rhythm, no M/R/G Respiratory: Slight wheezing in bilateral lungs, nonproductive cough, normal work of breathing Abdomen: Soft nontender nondistended Extremities: Right-sided weakness, unchanged from prior exam.  4/5 RUE with no pronator drift. 3/ 5 RLE  Laboratory: Most recent CBC Lab Results  Component Value Date   WBC 11.4 (H) 04/04/2023   HGB 13.1 04/04/2023   HCT 40.1 04/04/2023   MCV 96.2 04/04/2023   PLT 212 04/04/2023   Most recent BMP    Latest Ref Rng & Units 04/04/2023    7:51 AM  BMP  Glucose 70 - 99 mg/dL 696   BUN 8 - 23 mg/dL 16   Creatinine 2.95 - 1.24 mg/dL 2.84   Sodium 132 - 440 mmol/L 134   Potassium 3.5 - 5.1 mmol/L 4.5   Chloride 98 - 111 mmol/L 94   CO2 22 - 32 mmol/L 26   Calcium 8.9 - 10.3 mg/dL 8.9      Imaging/Diagnostic Tests: No new imaging  Penne Lash, MD 04/05/2023, 9:43 AM  PGY-1, [facility] FPTS Intern pager: [phone], text pages welcome Secure chat group [facility] Teaching Service

## 2023-04-05 NOTE — Consult Note (Signed)
   [facility] Inpatient Consult   04/05/2023  WAYMAN HEGGEN [birth year] [id number]  [facility] [[facility]]  Accountable Care Organization [ACO] Patient: Medicare ACO REACH  Primary Care Provider: Benita Stabile, MD   Patient screened for less than 7 days  hospitalization with noted low risk score for unplanned readmission risk to assess for potential [facility]  [[facility]] Care Management service needs for post hospital transition for care coordination.  Review of patient's electronic medical record reveals patient is admitted with COVID + and weakness [04/02/23] with noted recent brain surgery due to subdural hematoma hospitalized [03/26/23 to 03/28/23] from home with wife.   Review of TOC team notes reveals patient had [facility] set up on 03/28/23.  Plan:  Continue to follow progress and disposition to assess for post hospital community care coordination/management needs.  Referral request for community care coordination: pending disposition, following for needs.  Of note, [facility] Care Management/Population Health does not replace or interfere with any arrangements made by the Inpatient Transition of Care team.  For questions contact:   Charlesetta Shanks, RN BSN CCM [facility]  [phone] business mobile phone Toll free office [phone]  *Concierge Line  [phone] Fax number: [phone] [email or web address]

## 2023-04-06 ENCOUNTER — Other Ambulatory Visit (HOSPITAL_COMMUNITY): Payer: Self-pay

## 2023-04-06 NOTE — Plan of Care (Signed)

## 2023-04-06 NOTE — Discharge Summary (Addendum)
Family Medicine Teaching [facility] Discharge Summary  Patient name: [name] Medical record number: [id number] Date of birth: 1946/01/14 Age: 77 y.o. Gender: male Date of Admission: 04/02/2023  Date of Discharge: 04/06/23  Admitting Physician: Shelby Mattocks, DO  Primary Care Provider: Benita Stabile, MD Consultants: Neurosurgery   Indication for Hospitalization: R sided weakness and COVID +   Brief Hospital Course:  [name] is a 77 y.o.male with a history of paroxysmal A fib and recent subdural hematoma requiring burr holes (7/30) who was admitted to the [facility] Medicine Teaching Service at [facility] for R sided weakness and COVID-19 infection. His hospital course is detailed below:  R sided weakness History of a recent subdural hematoma he is status post left bur hole procedure on 7/30 and presenting with right-sided weakness.  CT head on admission was stable to prior hospitalization, MRI brain showed no acute changes.  Constantly tested COVID-positive.  CT PE negative for PE however did show severe COPD.  Troponins flat at 19 and 20.  Neurosurgery consulted and recommended further workup for middle meningeal artery embolization outpatient. Patient was stable on discharge to home with [facility] PT/OT from a weakness standpoint and to finish 5-day course of Decadron 6 mg for COVID.  Other chronic conditions were medically managed with home medications and formulary alternatives as necessary (HTN, paroxysmal A-fib)  PCP Follow-up Recommendations: Held patients amlodipine as patients SBP was slightly low to normotensive (96-107) without BP medication.  Consider restarting if necessary. Patient to follow up with Cardiology for watchman device given holding anticoagulation in setting of recent SDH- if not a candidate, consider anticoagulation and risks/benefits with patient Patient has history of COPD not on any medications. Had COVID during hospital stay and was given short course of  steroids. Consider reevaluating patients need for treatment.   Discharge Diagnoses/Problem List:  Principal Problem:   Right sided weakness Active Problems:   COVID-19  Disposition: Home with HHPT/OT  Discharge Condition: Stable  Discharge Exam:  General: age appropriate, pleasant  Cardiovascular: irregularly irregular  Respiratory: NWOB on RA Abdomen: soft, nontender, nondistended  Extremities: right sided upper and lower extremity weakness, unchanged from prior   Significant Procedures: None   Significant Labs and Imaging:  No results for input(s): "WBC", "HGB", "HCT", "PLT" in the last 48 hours. No results for input(s): "NA", "K", "CL", "CO2", "GLUCOSE", "BUN", "CREATININE", "CALCIUM", "MG", "PHOS", "ALKPHOS", "AST", "ALT", "ALBUMIN", "PROTEIN" in the last 48 hours.  MRI: IMPRESSION: 1. Unchanged size of 21 mm left convexity extra-axial collection 2. Unchanged 5 mm rightward midline shift.   CT PE:   IMPRESSION: Severe COPD. There is no evidence of pulmonary artery embolism. Aortic atherosclerosis. Coronary artery calcifications are seen. Previous coronary bypass surgery.   Possible mucus impaction in bronchial branches in left lower lobe. Small linear patchy densities in posterior left lower lung fields suggest scarring or atelectasis. There is no focal pulmonary consolidation. There is no pleural effusion or pneumothorax.   Small hiatal hernia   Results/Tests Pending at Time of Discharge: None  Discharge Medications:  Allergies as of 04/06/2023       Reactions   Doxycycline Shortness Of Breath   Also has to urinate more often   Nsaids Hypertension   Gastritis         Medication List     STOP taking these medications    amLODipine 5 MG tablet Commonly known as: NORVASC       TAKE these medications  acetaminophen 500 MG tablet Commonly known as: TYLENOL Take 1 tablet (500 mg total) by mouth every 6 (six) hours for 8 days then as directed by  MD What changed:  how much to take when to take this reasons to take this   ALPRAZolam 0.25 MG tablet Commonly known as: XANAX TAKE 1 TABLET(0.25 MG) BY MOUTH DAILY What changed: See the new instructions.   atorvastatin 40 MG tablet Commonly known as: LIPITOR TAKE 1 TABLET(40 MG) BY MOUTH DAILY What changed:  how much to take how to take this when to take this additional instructions   dexamethasone 6 MG tablet Commonly known as: DECADRON Take 1 tablet (6 mg total) by mouth daily for 4 days.   diltiazem 30 MG tablet Commonly known as: Cardizem Take 1 tablet every 4 hours AS NEEDED for heart rate >100 as long as blood pressure >100. What changed:  how much to take how to take this when to take this   dofetilide 250 MCG capsule Commonly known as: TIKOSYN Take 1 capsule (250 mcg total) by mouth 2 (two) times daily.   Fish Oil 1000 MG Caps Take 1,000 mg by mouth daily.   lisinopril 20 MG tablet Commonly known as: ZESTRIL TAKE 1 TABLET(20 MG) BY MOUTH DAILY What changed:  how much to take how to take this when to take this   multivitamin with minerals tablet Take 1 tablet by mouth at bedtime.   nitroGLYCERIN 0.4 MG SL tablet Commonly known as: NITROSTAT Place 1 tablet (0.4 mg total) under the tongue every 5 (five) minutes as needed for chest pain.   potassium chloride 10 MEQ tablet Commonly known as: KLOR-CON TAKE 1 TABLET(10 MEQ) BY MOUTH DAILY What changed:  how much to take how to take this when to take this additional instructions   Vitamin C 500 MG Chew Chew 500 mg by mouth daily.        Discharge Instructions: Please refer to Patient Instructions section of EMR for full details.  Patient was counseled important signs and symptoms that should prompt return to medical care, changes in medications, dietary instructions, activity restrictions, and follow up appointments.   Follow-Up Appointments:  Follow-up Information     Lisbeth Renshaw, MD  Follow up.   Specialty: Neurosurgery Contact information: [address] [place] [address] [phone]         Benita Stabile, MD Follow up.   Specialty: Internal Medicine Contact information: [address] [place] [address] [phone]         Care, [facility] Follow up.   Specialty: Home Health Services Why: resumption of home health services will be provided by [facility], start of care within 48 hours post discharge Contact information: [address] [place] [address] [phone]                 Hal Morales, MD 04/06/2023, 1:51 PM PGY-1, [facility] Family Medicine  I was personally present for medical decision making activities of this service and have verified that the service and findings are accurately documented in the resident's note.  Shelby Mattocks, DO                  04/06/2023, 1:52 PM

## 2023-04-06 NOTE — Assessment & Plan Note (Signed)
 History of subdural hematoma, s/p left burr hole procedure on 7/30.  CT head on admission stable from prior hospitalization. MRI brain did not show any acute changes. Neurosurgery recommended possible middle meningeal artery embolization. PT/OT recommended CIR  -Neurochecks every 4 hours -Fall precautions -SCDs for VTE prophylaxis -PT/OT following

## 2023-04-06 NOTE — Progress Notes (Signed)
Occupational Therapy Treatment Patient Details Name: [name] MRN: [id number] DOB: 02-22-1946 Today's Date: 04/06/2023   History of present illness Pt is a 77 y.o. M who presents 04/02/2023 with progressive R sided weakness and confusion. Covid+ MRI shows stable SDH. Plan for MMA embolization 8/20. PMH: AAA, atrial fibrillation, CAD s/p CABG, SDH with burr hole 7/30.   OT comments  Patient demonstrating good gains this treatment session. Patient seated in recliner and able to stand with min/CGA and CGA to ambulate to sink. Patient able to stand for grooming and UB bathing tasks standing at sink with CGA and seated rest break before returning to recliner. Patient performed BUE strengthening with yellow band. Patient ambulated to bathroom with CGA to stand from recliner and min assist to stand from regular height toilet. Discharge recommendations changed to [facility] to continue to address increasing independence and safety with self care and functional transfers.       If plan is discharge home, recommend the following:  A little help with walking and/or transfers;A little help with bathing/dressing/bathroom;Assistance with cooking/housework;Direct supervision/assist for medications management;Direct supervision/assist for financial management;Assist for transportation;Help with stairs or ramp for entrance   Equipment Recommendations  Other (comment) (defer)    Recommendations for Other Services      Precautions / Restrictions Precautions Precautions: Fall Precaution Comments: watch HR Restrictions Weight Bearing Restrictions: No       Mobility Bed Mobility Overal bed mobility: Needs Assistance             General bed mobility comments: OOB in recliner    Transfers Overall transfer level: Needs assistance Equipment used: Rolling walker (2 wheels) Transfers: Sit to/from Stand, Bed to chair/wheelchair/BSC Sit to Stand: Contact guard assist, Min assist           General  transfer comment: min to CGA for sit to stand and cues for safety and hand placment     Balance Overall balance assessment: Needs assistance Sitting-balance support: Feet supported, No upper extremity supported Sitting balance-Leahy Scale: Fair Sitting balance - Comments: seated in recliner Postural control: Right lateral lean Standing balance support: During functional activity, Bilateral upper extremity supported, Single extremity supported Standing balance-Leahy Scale: Poor (to fair) Standing balance comment: able to stand at sink for self care tasks with one extremity support                           ADL either performed or assessed with clinical judgement   ADL Overall ADL's : Needs assistance/impaired     Grooming: Wash/dry hands;Wash/dry face;Oral care;Brushing hair;Contact guard assist;Standing Grooming Details (indicate cue type and reason): at sink Upper Body Bathing: Minimal assistance;Standing Upper Body Bathing Details (indicate cue type and reason): at sink             Toilet Transfer: Contact guard assist;Minimal assistance;Ambulation;Regular Toilet;Rolling walker (2 wheels) Toilet Transfer Details (indicate cue type and reason): cues for hand placement and min assist to lower to toilet for safety           General ADL Comments: good gains this treatment session with OT    Extremity/Trunk Assessment              Vision       Perception     Praxis      Cognition Arousal: Alert Behavior During Therapy: WFL for tasks assessed/performed Overall Cognitive Status: Impaired/Different from baseline  Current Attention Level: Selective Memory: Decreased short-term memory Following Commands: Follows one step commands consistently, Follows one step commands with increased time, Follows multi-step commands inconsistently, Follows multi-step commands with increased time Safety/Judgement: Decreased awareness of  deficits, Decreased awareness of safety Awareness: Emergent Problem Solving: Slow processing, Decreased initiation, Difficulty sequencing, Requires verbal cues General Comments: aware of deficits but requires cues for safety and pacing        Exercises Exercises: General Upper Extremity General Exercises - Upper Extremity Shoulder Flexion: AROM, Right, 10 reps, Seated Shoulder Horizontal ABduction: Strengthening, Both, 10 reps, Seated, Theraband Theraband Level (Shoulder Horizontal Abduction): Level 1 (Yellow) Elbow Flexion: Strengthening, Both, 10 reps, Seated, Theraband Theraband Level (Elbow Flexion): Level 1 (Yellow) Elbow Extension: Strengthening, Both, 10 reps, Seated, Theraband Theraband Level (Elbow Extension): Level 1 (Yellow)    Shoulder Instructions       General Comments      Pertinent Vitals/ Pain       Pain Assessment Pain Assessment: Faces Faces Pain Scale: No hurt Pain Intervention(s): Monitored during session  Home Living     Available Help at Discharge: Family Type of Home: House                   Bathroom Accessibility: Yes How Accessible: Accessible via walker     Additional Comments: pt was working before last admission  Lives With: Spouse    Prior Functioning/Environment              Frequency  Min 1X/week        Progress Toward Goals  OT Goals(current goals can now be found in the care plan section)  Progress towards OT goals: Progressing toward goals  Acute Rehab OT Goals Patient Stated Goal: go home OT Goal Formulation: With patient Time For Goal Achievement: 04/17/23 Potential to Achieve Goals: Good ADL Goals Pt Will Perform Upper Body Dressing: with contact guard assist Pt Will Perform Lower Body Dressing: with contact guard assist Pt Will Transfer to Toilet: with contact guard assist Pt Will Perform Tub/Shower Transfer: Tub transfer;Shower transfer;with min assist;ambulating Additional ADL Goal #1: pt will  perform bed mobility with min guard A in prep for ADLs  Plan Discharge plan needs to be updated    Co-evaluation                 AM-PAC OT "6 Clicks" Daily Activity     Outcome Measure   Help from another person eating meals?: None Help from another person taking care of personal grooming?: A Little Help from another person toileting, which includes using toliet, bedpan, or urinal?: A Little Help from another person bathing (including washing, rinsing, drying)?: A Little Help from another person to put on and taking off regular upper body clothing?: A Little Help from another person to put on and taking off regular lower body clothing?: A Little 6 Click Score: 19    End of Session Equipment Utilized During Treatment: Gait belt;Rolling walker (2 wheels)  OT Visit Diagnosis: Unsteadiness on feet (R26.81);Other abnormalities of gait and mobility (R26.89);Muscle weakness (generalized) (M62.81)   Activity Tolerance Patient tolerated treatment well   Patient Left in chair;with call bell/phone within reach;with chair alarm set   Nurse Communication Mobility status        Time: 8295-6213 OT Time Calculation (min): 30 min  Charges: OT General Charges $OT Visit: 1 Visit OT Treatments $Self Care/Home Management : 8-22 mins $Therapeutic Exercise: 8-22 mins  Alfonse Flavors, OTA Acute Rehabilitation Services  Office  [phone]   Dewain Penning 04/06/2023, 11:02 AM

## 2023-04-06 NOTE — Progress Notes (Signed)
Discharge instructions given. Patient verbalized understanding and all questions were answered.  ?

## 2023-04-06 NOTE — Assessment & Plan Note (Signed)
Patient remained afebrile overnight.  - Decadron 6mg  for 5 days (8/7- ) -As needed Tylenol for fever -Droplet precaution in place

## 2023-04-06 NOTE — Progress Notes (Signed)
Inpatient Rehabilitation Admissions Coordinator   I met with patient and his sister at bedside. Patient reports he is much improved today and has worked with therapy today. I verified with OT his improved functional level today. Patient requesting discharge home today. I will alert acute team and TOC. We will sign off.  Ottie Glazier, RN, MSN Rehab Admissions Coordinator [phone] 04/06/2023 10:42 AM

## 2023-04-06 NOTE — Progress Notes (Signed)
     Daily Progress Note Intern Pager: [phone]  Patient name: [name] Medical record number: [id number] Date of birth: [birth year] Age: 77 y.o. Gender: male  Primary Care Provider: Benita Stabile, MD Consultants: Neurosurgery  Code Status: Full  Pt Overview and Major Events to Date:  8/6 - admitted  8/7 - PT/OT recommended CIR 8/8- Awaiting CIR bed   Assessment and Plan: [name] is a 77 year old male with past medical history of A-fib, recent subdural hematoma status post bur hole decompression on 7/30 admitted for worsening right-sided weakness and confusion found to be COVID-positive.  [facility]     * (Principal) Right sided weakness     History of subdural hematoma, s/p left burr hole procedure on 7/30.  CT  head on admission stable from prior hospitalization. MRI brain did not  show any acute changes. Neurosurgery recommended possible middle meningeal  artery embolization. PT/OT recommended CIR  - awaiting CIR authorization  - Fall precautions -PT/OT following        COVID-19     Patient remained afebrile overnight.  - Decadron 6mg  for 5 days (8/7- ) -As needed Tylenol for fever -Droplet precaution in place      Chronic and stable  A fib- -Continue home medication of diltiazem and Tikosyn - vital signs per floor    FEN/GI: Regular PPx: Lovenox Dispo:CIR pending auth  Subjective:  Patient reports he is doing well today. He states his condom catheter was leaking so they gave him a urinal overnight. He states he is able to walk around okay and thinks its best for the family if he goes home.   Objective: Temp:  [97.7 F (36.5 C)-98.5 F (36.9 C)] 97.8 F (36.6 C) (08/09 0700) Pulse Rate:  [73-124] 99 (08/09 0856) Resp:  [16-18] 18 (08/09 0856) BP: (88-132)/(61-106) 115/80 (08/09 0856) SpO2:  [92 %-96 %] 96 % (08/09 0856) Physical Exam: General: age appropriate, pleasant  Cardiovascular: irregularly irregular  Respiratory: NWOB on RA Abdomen:  soft, nontender, nondistended  Extremities: right sided upper and lower extremity weakness, unchanged from prior   Laboratory: Most recent CBC Lab Results  Component Value Date   WBC 11.4 (H) 04/04/2023   HGB 13.1 04/04/2023   HCT 40.1 04/04/2023   MCV 96.2 04/04/2023   PLT 212 04/04/2023   Most recent BMP    Latest Ref Rng & Units 04/04/2023    7:51 AM  BMP  Glucose 70 - 99 mg/dL 440   BUN 8 - 23 mg/dL 16   Creatinine 3.47 - 1.24 mg/dL 4.25   Sodium 956 - 387 mmol/L 134   Potassium 3.5 - 5.1 mmol/L 4.5   Chloride 98 - 111 mmol/L 94   CO2 22 - 32 mmol/L 26   Calcium 8.9 - 10.3 mg/dL 8.9     Imaging/Diagnostic Tests: No new imaging  Penne Lash, MD 04/06/2023, 9:33 AM  PGY-1, [place] [facility] FPTS Intern pager: [phone], text pages welcome Secure chat group [facility] Teaching Service

## 2023-04-06 NOTE — Plan of Care (Signed)

## 2023-04-09 ENCOUNTER — Other Ambulatory Visit (HOSPITAL_COMMUNITY): Payer: Self-pay

## 2023-04-10 ENCOUNTER — Other Ambulatory Visit (HOSPITAL_COMMUNITY): Payer: Self-pay | Admitting: Neurosurgery

## 2023-04-10 DIAGNOSIS — I6203 Nontraumatic chronic subdural hemorrhage: Secondary | ICD-10-CM

## 2023-04-11 DIAGNOSIS — S065XAD Traumatic subdural hemorrhage with loss of consciousness status unknown, subsequent encounter: Secondary | ICD-10-CM | POA: Diagnosis not present

## 2023-04-11 DIAGNOSIS — J449 Chronic obstructive pulmonary disease, unspecified: Secondary | ICD-10-CM | POA: Diagnosis not present

## 2023-04-11 DIAGNOSIS — I480 Paroxysmal atrial fibrillation: Secondary | ICD-10-CM | POA: Diagnosis not present

## 2023-04-11 DIAGNOSIS — G8191 Hemiplegia, unspecified affecting right dominant side: Secondary | ICD-10-CM | POA: Diagnosis not present

## 2023-04-11 DIAGNOSIS — I10 Essential (primary) hypertension: Secondary | ICD-10-CM | POA: Diagnosis not present

## 2023-04-11 DIAGNOSIS — I2510 Atherosclerotic heart disease of native coronary artery without angina pectoris: Secondary | ICD-10-CM | POA: Diagnosis not present

## 2023-04-11 NOTE — Progress Notes (Signed)
Surgical Instructions   Your procedure is scheduled on Tuesday April 17, 2023. Report to [facility] Main Entrance "A" at 6:30 A.M., then check in with the Admitting office. Any questions or running late day of surgery: call [phone]  Questions prior to your surgery date: call [phone], Monday-Friday, 8am-4pm. If you experience any cold or flu symptoms such as cough, fever, chills, shortness of breath, etc. between now and your scheduled surgery, please notify us at the above number.     Remember:  Do not eat after midnight the night before your surgery   You may drink clear liquids until 5:30 the morning of your surgery.   Clear liquids allowed are: Water, Non-Citrus Juices (without pulp), Carbonated Beverages, Clear Tea, Black Coffee Only (NO MILK, CREAM OR POWDERED CREAMER of any kind), and Gatorade.    Take these medicines the morning of surgery with A SIP OF WATER  ALPRAZolam (XANAX)  atorvastatin (LIPITOR)  diltiazem (CARDIZEM)  dofetilide (TIKOSYN    May take these medicines IF NEEDED: acetaminophen (TYLENOL)  nitroGLYCERIN (NITROSTAT); if you use this medication, please go to the Emergency Room and call us at [phone]   One week prior to surgery, STOP taking any Aspirin (unless otherwise instructed by your surgeon) Aleve, Naproxen, Ibuprofen, Motrin, Advil, Goody's, BC's, all herbal medications, fish oil, and non-prescription vitamins.                     Do NOT Smoke (Tobacco/Vaping) for 24 hours prior to your procedure.  If you use a CPAP at night, you may bring your mask/headgear for your overnight stay.   You will be asked to remove any contacts, glasses, piercing's, hearing aid's, dentures/partials prior to surgery. Please bring cases for these items if needed.    Patients discharged the day of surgery will not be allowed to drive home, and someone needs to stay with them for 24 hours.  SURGICAL WAITING ROOM VISITATION Patients may have no more than  2 support people in the waiting area - these visitors may rotate.   Pre-op nurse will coordinate an appropriate time for 1 ADULT support person, who may not rotate, to accompany patient in pre-op.  Children under the age of 77 must have an adult with them who is not the patient and must remain in the main waiting area with an adult.  If the patient needs to stay at the hospital during part of their recovery, the visitor guidelines for inpatient rooms apply.  Please refer to the [facility] website for the visitor guidelines for any additional information.   If you received a COVID test during your pre-op visit  it is requested that you wear a mask when out in public, stay away from anyone that may not be feeling well and notify your surgeon if you develop symptoms. If you have been in contact with anyone that has tested positive in the last 10 days please notify you surgeon.      Pre-operative CHG Bathing Instructions   You can play a key role in reducing the risk of infection after surgery. Your skin needs to be as free of germs as possible. You can reduce the number of germs on your skin by washing with CHG (chlorhexidine gluconate) soap before surgery. CHG is an antiseptic soap that kills germs and continues to kill germs even after washing.   DO NOT use if you have an allergy to chlorhexidine/CHG or antibacterial soaps. If your skin becomes reddened or irritated,  stop using the CHG and notify one of our RNs at [phone].              TAKE A SHOWER THE NIGHT BEFORE SURGERY AND THE DAY OF SURGERY    Please keep in mind the following:  DO NOT shave, including legs and underarms, 48 hours prior to surgery.   You may shave your face before/day of surgery.  Place clean sheets on your bed the night before surgery Use a clean washcloth (not used since being washed) for each shower. DO NOT sleep with pet's night before surgery.  CHG Shower Instructions:  If you choose to wash your hair  and private area, wash first with your normal shampoo/soap.  After you use shampoo/soap, rinse your hair and body thoroughly to remove shampoo/soap residue.  Turn the water OFF and apply half the bottle of CHG soap to a CLEAN washcloth.  Apply CHG soap ONLY FROM YOUR NECK DOWN TO YOUR TOES (washing for 3-5 minutes)  DO NOT use CHG soap on face, private areas, open wounds, or sores.  Pay special attention to the area where your surgery is being performed.  If you are having back surgery, having someone wash your back for you may be helpful. Wait 2 minutes after CHG soap is applied, then you may rinse off the CHG soap.  Pat dry with a clean towel  Put on clean pajamas    Additional instructions for the day of surgery: DO NOT APPLY any lotions, deodorants or cologne.   Do not wear jewelry Do not bring valuables to the hospital. [facility] is not responsible for valuables/personal belongings. Put on clean/comfortable clothes.  Please brush your teeth.  Ask your nurse before applying any prescription medications to the skin.

## 2023-04-12 ENCOUNTER — Other Ambulatory Visit: Payer: Self-pay

## 2023-04-12 ENCOUNTER — Ambulatory Visit (HOSPITAL_COMMUNITY): Payer: Medicare Other

## 2023-04-12 ENCOUNTER — Encounter (HOSPITAL_COMMUNITY)
Admission: RE | Admit: 2023-04-12 | Discharge: 2023-04-12 | Disposition: A | Discharge status: Home / Self Care | Payer: Medicare Other | Source: Ambulatory Visit | Attending: Neurosurgery | Admitting: Neurosurgery

## 2023-04-12 ENCOUNTER — Encounter (HOSPITAL_COMMUNITY): Payer: Self-pay

## 2023-04-12 VITALS — BP 151/76 | HR 68 | Temp 97.9°F | Resp 18 | Ht 65.0 in | Wt 136.5 lb

## 2023-04-12 DIAGNOSIS — I10 Essential (primary) hypertension: Secondary | ICD-10-CM | POA: Insufficient documentation

## 2023-04-12 DIAGNOSIS — Z01812 Encounter for preprocedural laboratory examination: Secondary | ICD-10-CM | POA: Diagnosis not present

## 2023-04-12 DIAGNOSIS — K449 Diaphragmatic hernia without obstruction or gangrene: Secondary | ICD-10-CM | POA: Insufficient documentation

## 2023-04-12 DIAGNOSIS — Z01818 Encounter for other preprocedural examination: Secondary | ICD-10-CM

## 2023-04-12 DIAGNOSIS — I700 Atherosclerosis of aorta: Secondary | ICD-10-CM | POA: Insufficient documentation

## 2023-04-12 DIAGNOSIS — I714 Abdominal aortic aneurysm, without rupture, unspecified: Secondary | ICD-10-CM | POA: Diagnosis not present

## 2023-04-12 DIAGNOSIS — I480 Paroxysmal atrial fibrillation: Secondary | ICD-10-CM | POA: Diagnosis not present

## 2023-04-12 DIAGNOSIS — J449 Chronic obstructive pulmonary disease, unspecified: Secondary | ICD-10-CM | POA: Insufficient documentation

## 2023-04-12 DIAGNOSIS — Z951 Presence of aortocoronary bypass graft: Secondary | ICD-10-CM | POA: Insufficient documentation

## 2023-04-12 DIAGNOSIS — I6203 Nontraumatic chronic subdural hemorrhage: Secondary | ICD-10-CM | POA: Insufficient documentation

## 2023-04-12 DIAGNOSIS — I2510 Atherosclerotic heart disease of native coronary artery without angina pectoris: Secondary | ICD-10-CM | POA: Diagnosis not present

## 2023-04-12 HISTORY — DX: Personal history of other diseases of the digestive system: Z87.19

## 2023-04-12 LAB — BASIC METABOLIC PANEL
Anion gap: 11 (ref 5–15)
BUN: 21 mg/dL (ref 8–23)
CO2: 25 mmol/L (ref 22–32)
Calcium: 8.2 mg/dL — ABNORMAL LOW (ref 8.9–10.3)
Chloride: 97 mmol/L — ABNORMAL LOW (ref 98–111)
Creatinine, Ser: 1.05 mg/dL (ref 0.61–1.24)
GFR, Estimated: >60 mL/min (ref >60)
Glucose, Bld: 91 mg/dL (ref 70–99)
Potassium: 4.8 mmol/L (ref 3.5–5.1)
Sodium: 133 mmol/L — ABNORMAL LOW (ref 135–145)

## 2023-04-12 LAB — CBC
HCT: 44 % (ref 39.0–52.0)
Hemoglobin: 14 g/dL (ref 13.0–17.0)
MCH: 31.3 pg (ref 26.0–34.0)
MCHC: 31.8 g/dL (ref 30.0–36.0)
MCV: 98.4 fL (ref 80.0–100.0)
Platelets: 305 10*3/uL (ref 150–400)
RBC: 4.47 MIL/uL (ref 4.22–5.81)
RDW: 13.9 % (ref 11.5–15.5)
WBC: 13.9 10*3/uL — ABNORMAL HIGH (ref 4.0–10.5)
nRBC: 0 % (ref 0.0–0.2)

## 2023-04-12 NOTE — Progress Notes (Addendum)
Anesthesia Chart Review:  Date/Time: 04/17/23 0830   Procedure: IR TRANSCATH/EMBOLIZ   Diagnosis: Chronic subdural hematoma (HCC) [I62.03]   Indications: left MMA embolization   Location: [facility] INTERVENTIONAL RADIOLOGY       DISCUSSION: Patient is a 26 male scheduled for the above procedure. S/p burr holes for symptomatic enlarging acute/subacute SDH 03/27/23. Recurrent symptoms of weakness, R > L, with episodic confusion 04/02/23 but also in the setting of fever/chills and + COVID-19. Imaging showed stable 21 mm left convexity extra-axial collection and unchanged 5 mm rightward midline shift. Left middle meningeal artery embolization recommended. Patient reported feeling recovered from COVID-19 since 04/05/23.  History includes former smoker (quit 08/28/06), COPD, HTN, CAD (s/p CABG x5 03/06/07: LIMA-LAD, SVG-DIAG, SVG-PDA, SVG-OM1-OM2), afib/PAF (s/p DCCV 09/08/15, recurrent aflutter 12/06/15), carotid artery disease (60-79% RICA, 1-39% LICA 09/06/22), AAA (3.0 infrarenal AAA 06/27/20 with 3 year f/u recommended), anxiety. Per general surgery notes, he sustained a traumatic injury in the 1970's which lead to a colostomy followed by colostomy reversal and hernia repair, and he developed a chronic abdominal wound ~ 08/2018, s/p excision of "stitch granuloma" 10/08/18.   [facility] admission 03/26/23 - 03/28/23 for progressive weakness and multiple falls. He was on Xarelto for afib history and had fallen backwards off a trailer while loading an AC unit and hit his head with +LOC on 01/31/23. 01/31/23 head CT at [facility] showed small focus of SDH without mass effect or midline shift with stable findings on 02/01/23.  03/26/23 head CT in ED showed 2.6 cm left cerebral convexity acute-subacute SDH with 7 mm rightward midline shift. He required Xarelto reversal with Andexxa followed by left burr hole evacuation of SDH 03/27/23. Xarelto held at discharge given SDH with neurosurgery and [facility] follow-up.  Consider evaluation for Watchman device in the future.    Re-admission 04/02/23 - 04/06/23 for worsening weakness (R > L) with intermittent confusion. He has reported fever and chills, tachycardia. COVID-19 test was positive. Lactic acid normal. HS Troponins 19-20, flat. CXR with streaky opacity at left lung bases, atelectasis favored over pneumonia. 04/03/23 chest CTA showed no consolidation but possible mucous impaction in the bronchial branches in LLL, no PE, severe COPD. COVID-19 was treated with 5 day course of Decadron. Head CT and brain MRI were stable with unchanged size of 21 mm left convexity extra-axial collection and unchanged 5 mm rightward midline shift. Neurosurgeon Dr. Autumn Patty consulted. He wrote, "no acute neurosurgical intervention indicated at this time. He had enough decompression to recover function post-op with a very sudden worsening that isn't focal. I think this is more of a 'recrudescence' type picture due to his Covid infection, although obviously not using the strict definition of the term. Given this and his potential need for restarting anticoagulation in the future, I think the best neurosurgical plan of care is set him up for middle meningeal embolization.." He was discharged his [facility] PT/OT. He reported COVID symptoms improved since 04/05/23.   Last cardiology follow-up was on 02/09/23 with Alphonzo Severance, PA-C at the [facility]. He noted Xarelto on hold given small SDH on 01/31/23. B-blocker had also been discontinued at [facility] due to bradycardia. Continue dofetilide recommended. Plan was to resume Xarelto ~ 02/13/23 if okay with neurology/neurosurgery at follow-up visit. (As above, Xarelto discontinued again after enlarging SDH 03/26/23.). He last saw Dr. Anne Fu on 10/03/22 and felt to be doing well from a CAD standpoint. Monitoring moderate carotid stenosis on GDMT. AAA imaging due ~ 05/2023. One  year follow-up planned.  Last echo ([facility]) on 02/01/23 showed LVEF 55-60%, mildly elevated  RVSP 37-49 mmHg, mild TR, negative bubble study.  Mr. Mcbeth is > 10 days out from COVID-19 diagnosis and now feels recovered. He did have some coughing initially, but primarily presented with fever/chills as well as neurologic symptoms as previously mentioned. No definite pneumonia on imaging then. Given some URI symptoms at time of diagnosis could consider postponing surgery to allow additional time to recover; However, case may be time sensitive given recurrent symptoms of SDH. I discussed with anesthesiologist Marcene Duos, MD and with Erie Noe at Dr. Val Riles office. She will clarify urgency of the procedure with Dr. Conchita Paris. If he feels case should not be delayed then would anticipate case to proceed as scheduled if otherwise no changes in cardiopulmonary status.   VS: BP (!) 151/76   Pulse 68   Temp 36.6 C   Resp 18   Ht 5\' 5"  (1.651 m)   Wt 61.9 kg   SpO2 97%   BMI 22.71 kg/m    PROVIDERS: Benita Stabile, MD is PCP  Donato Schultz, MD is cardiologist. He is also followed by Alphonzo Severance, PA-C and Rudi Coco, NP with the [facility]. Despina Arias, MD is neurologist   LABS: Preoperative labs noted. Labs repeated from 04/04/23 given those labs were done during acute admission and had mild elevation in Creatinine. Now Creatinine back to normal at 1.05. WBC is up to 13.9 and Erie Noe was notified at surgeon office. He denied fever, cough, chest pain, SOB at PAT visit. (all labs ordered are listed, but only abnormal results are displayed)  Labs Reviewed  BASIC METABOLIC PANEL - Abnormal; Notable for the following components:      Result Value   Sodium 133 (*)    Chloride 97 (*)    Calcium 8.2 (*)    All other components within normal limits  CBC - Abnormal; Notable for the following components:   WBC 13.9 (*)    All other components within normal limits     IMAGES: CT Chest 04/03/23: IMPRESSION: - Severe COPD. There is no evidence of pulmonary artery  embolism. Aortic atherosclerosis. Coronary artery calcifications are seen. Previous coronary bypass surgery. - Possible mucus impaction in bronchial branches in left lower lobe. Small linear patchy densities in posterior left lower lung fields suggest scarring or atelectasis. There is no focal pulmonary consolidation. There is no pleural effusion or pneumothorax. - Small hiatal hernia.   Brain MRI 04/03/23: IMPRESSION: 1. Unchanged size of 21 mm left convexity extra-axial collection 2. Unchanged 5 mm rightward midline shift.     EKG: EKG 04/02/23 (in setting of COVID): Sinus or ectopic atrial tachycardia at 123 bpm Multiform ventricular premature complexes Anteroseptal infarct, old Nonspecific repol abnormality, lateral leads When compared with ECG of 03/26/2023, HEART RATE has increased Confirmed by Dione Booze ([id number]) on 04/03/2023 3:51:02 AM  EKG 03/26/23: Atrial fibrillation Ventricular premature complex No acute changes Nonspecific ST and T wave abnormality No significant change since last tracing Confirmed by Derwood Kaplan [phone]) on 03/26/2023 12:18:09 PM   CV: Echo 02/01/23 ([facility]): Left Ventricle: Systolic function is normal. EF: 55-60%.    Tricuspid Valve: The right ventricular systolic pressure is mildly  elevated (37-49 mmHg).    Tricuspid Valve: There is mild regurgitation.    Left Atrium: Injection of agitated saline documents no interatrial  shunt.  - Comparison echo 12/29/15: LVEF 45-50%, abnormal GLPSS at -13% with inferoseptal strain abnormality, trivial MR,  severely dilated LF at 51 ml/m2, mildly dilated RA/RV, trivial TR, PA peak pressure 21 mmHg   US Carotid 09/06/22: Summary:  - Right Carotid: Velocities in the right ICA are now consistent with a 60-79% stenosis. Flow diameter has decreased within the proximal ICA.  - Left Carotid: Velocities in the left ICA are consistent with a stable 1-39% stenosis.  - Vertebrals:  Bilateral vertebral arteries  demonstrate antegrade flow.  - Subclavians: Normal flow hemodynamics were seen in bilateral subclavian arteries.    Holter monitor 08/17/15 - 08/19/15: Atrial flutter  Rats 57 to 165 bpm  Average HR 105 bpm   Frequent PVCs , couplet.  1 7 beat run NSVT. No pauses     Last LHC was 03/05/07 prior to CABG. Reported last stress test in 2011.   Past Medical History:  Diagnosis Date   AAA (abdominal aortic aneurysm) (HCC)    06/27/20 MRI: 3.0 cm infrarenal AAA, recommend 3 year follow-up   Anxiety    Atrial fibrillation (HCC)    Bilateral carotid artery disease (HCC) 07/21/2013   CAD (coronary artery disease) 2008   CABG   COPD (chronic obstructive pulmonary disease) (HCC)    Dysrhythmia    A-fib   Gastritis    Headache(784.0)    History of hiatal hernia    Hypertension    SDH (subdural hematoma) (HCC) 01/31/2023   Visit for monitoring Tikosyn therapy 12/2015    Past Surgical History:  Procedure Laterality Date   BURR HOLE Left 03/27/2023   Procedure: BURR HOLE EVASCUATION OF SUBDURAL HEMATOMA;  Surgeon: Jadene Pierini, MD;  Location: [facility] OR;  Service: Neurosurgery;  Laterality: Left;   CARDIOVERSION N/A 09/08/2015   Procedure: CARDIOVERSION;  Surgeon: Vesta Mixer, MD;  Location: [facility] ENDOSCOPY;  Service: Cardiovascular;  Laterality: N/A;   CORONARY ARTERY BYPASS GRAFT     EXCISION OF KELOID Left 10/08/2018   Procedure: EXCISION OF CHRONIC ABDOMINAL WALL WOUND;  Surgeon: Abigail Miyamoto, MD;  Location: [facility];  Service: General;  Laterality: Left;   INCISION AND DRAINAGE DEEP NECK ABSCESS     LUMBAR LAMINECTOMY/DECOMPRESSION MICRODISCECTOMY Right 08/09/2020   Procedure: Laminectomy and Foraminotomy - Lumbar three-Lumbar four - Lumbar four-Lumbar five- right;  Surgeon: Donalee Citrin, MD;  Location: [facility] OR;  Service: Neurosurgery;  Laterality: Right;   ROTATOR CUFF REPAIR Bilateral    TEE WITHOUT CARDIOVERSION N/A 08/02/2015   Procedure: TRANSESOPHAGEAL  ECHOCARDIOGRAM (TEE);  Surgeon: Quintella Reichert, MD;  Location: [facility] ENDOSCOPY;  Service: Cardiovascular;  Laterality: N/A;   TEE WITHOUT CARDIOVERSION N/A 09/08/2015   Procedure: TRANSESOPHAGEAL ECHOCARDIOGRAM (TEE);  Surgeon: Vesta Mixer, MD;  Location: [facility] ENDOSCOPY;  Service: Cardiovascular;  Laterality: N/A;    MEDICATIONS:  acetaminophen (TYLENOL) 500 MG tablet   ALPRAZolam (XANAX) 0.25 MG tablet   Ascorbic Acid (VITAMIN C) 500 MG CHEW   atorvastatin (LIPITOR) 40 MG tablet   diltiazem (CARDIZEM) 30 MG tablet   dofetilide (TIKOSYN) 250 MCG capsule   lisinopril (ZESTRIL) 20 MG tablet   Multiple Vitamins-Minerals (MULTIVITAMIN WITH MINERALS) tablet   nitroGLYCERIN (NITROSTAT) 0.4 MG SL tablet   Omega-3 Fatty Acids (FISH OIL) 1000 MG CAPS   potassium chloride (KLOR-CON) 10 MEQ tablet   No current facility-administered medications for this encounter.    Shonna Chock, PA-C Surgical Short Stay/Anesthesiology [facility] Phone [phone] [facility] Phone [phone] 04/12/2023 4:48 PM

## 2023-04-12 NOTE — Progress Notes (Signed)
PCP - Dr. Nita Sells Cardiologist - Dr. Donato Schultz  PPM/ICD - denies   Chest x-ray - 04/02/23 EKG - 04/02/23 Stress Test - 09/2010 ECHO - 02/01/23- [facility] Cardiac Cath - 12/29/10  Sleep Study - denies   DM- denies  ASA/Blood Thinner Instructions: n/a   ERAS Protcol - yes, no drink   COVID TEST- n/a   Anesthesia review: yes, cardiac hx  Patient denies shortness of breath, fever, cough and chest pain at PAT appointment   All instructions explained to the patient, with a verbal understanding of the material. Patient agrees to go over the instructions while at home for a better understanding. The opportunity to ask questions was provided.

## 2023-04-12 NOTE — Anesthesia Preprocedure Evaluation (Addendum)
Anesthesia Evaluation  Patient identified by MRN, date of birth, ID band Patient awake    Reviewed: Allergy & Precautions, NPO status , Patient's Chart, lab work & pertinent test results, reviewed documented beta blocker date and time   History of Anesthesia Complications Negative for: history of anesthetic complications  Airway Mallampati: I   Neck ROM: Full    Dental  (+)    Pulmonary COPD, Recent URI , Resolved, former smoker COVID infection approx 2 weeks ago. Reports complete resolution of sx.    breath sounds clear to auscultation       Cardiovascular hypertension, + CAD and + CABG  + dysrhythmias Atrial Fibrillation  Rhythm:Irregular Rate:Normal  45-50% LVEF   Neuro/Psych  Headaches  Anxiety        GI/Hepatic hiatal hernia,,,(+) neg Cirrhosis        Endo/Other  neg diabetes    Renal/GU Renal disease     Musculoskeletal   Abdominal   Peds  Hematology   Anesthesia Other Findings   Reproductive/Obstetrics                              Anesthesia Physical Anesthesia Plan  ASA: 3  Anesthesia Plan: General   Post-op Pain Management:    Induction: Intravenous  PONV Risk Score and Plan: Ondansetron  Airway Management Planned: Oral ETT  Additional Equipment:   Intra-op Plan:   Post-operative Plan: Extubation in OR  Informed Consent:      Dental advisory given  Plan Discussed with: CRNA  Anesthesia Plan Comments: (See PAT note written 04/12/2023 by Shonna Chock, PA-C.  )        Anesthesia Quick Evaluation

## 2023-04-13 DIAGNOSIS — S065XAD Traumatic subdural hemorrhage with loss of consciousness status unknown, subsequent encounter: Secondary | ICD-10-CM | POA: Diagnosis not present

## 2023-04-13 DIAGNOSIS — G8191 Hemiplegia, unspecified affecting right dominant side: Secondary | ICD-10-CM | POA: Diagnosis not present

## 2023-04-13 DIAGNOSIS — J449 Chronic obstructive pulmonary disease, unspecified: Secondary | ICD-10-CM | POA: Diagnosis not present

## 2023-04-13 DIAGNOSIS — I2510 Atherosclerotic heart disease of native coronary artery without angina pectoris: Secondary | ICD-10-CM | POA: Diagnosis not present

## 2023-04-13 DIAGNOSIS — I10 Essential (primary) hypertension: Secondary | ICD-10-CM | POA: Diagnosis not present

## 2023-04-13 DIAGNOSIS — I480 Paroxysmal atrial fibrillation: Secondary | ICD-10-CM | POA: Diagnosis not present

## 2023-04-16 ENCOUNTER — Encounter (HOSPITAL_COMMUNITY): Payer: Self-pay

## 2023-04-16 ENCOUNTER — Other Ambulatory Visit: Payer: Self-pay | Admitting: Neurosurgery

## 2023-04-16 NOTE — Telephone Encounter (Signed)
This encounter was created in error - please disregard.

## 2023-04-17 ENCOUNTER — Encounter (HOSPITAL_COMMUNITY): Payer: Self-pay | Admitting: Neurosurgery

## 2023-04-17 ENCOUNTER — Inpatient Hospital Stay (HOSPITAL_COMMUNITY)
Admission: RE | Admit: 2023-04-17 | Discharge: 2023-04-17 | DRG: 066 | Disposition: A | Discharge status: Home / Self Care | Payer: Medicare Other | Attending: Neurosurgery | Admitting: Neurosurgery

## 2023-04-17 ENCOUNTER — Other Ambulatory Visit: Payer: Self-pay | Admitting: Neurosurgery

## 2023-04-17 ENCOUNTER — Inpatient Hospital Stay (HOSPITAL_COMMUNITY): Admission: RE | Admit: 2023-04-17 | Payer: Medicare Other | Source: Ambulatory Visit

## 2023-04-17 ENCOUNTER — Other Ambulatory Visit: Payer: Self-pay

## 2023-04-17 ENCOUNTER — Inpatient Hospital Stay (HOSPITAL_COMMUNITY): Payer: Medicare Other | Admitting: Vascular Surgery

## 2023-04-17 ENCOUNTER — Inpatient Hospital Stay (HOSPITAL_COMMUNITY): Payer: Medicare Other | Admitting: Certified Registered"

## 2023-04-17 ENCOUNTER — Encounter (HOSPITAL_COMMUNITY)
Admission: RE | Disposition: A | Discharge status: Home / Self Care | Payer: Self-pay | Source: Home / Self Care | Attending: Neurosurgery

## 2023-04-17 DIAGNOSIS — I6200 Nontraumatic subdural hemorrhage, unspecified: Secondary | ICD-10-CM | POA: Diagnosis present

## 2023-04-17 DIAGNOSIS — I671 Cerebral aneurysm, nonruptured: Secondary | ICD-10-CM | POA: Diagnosis present

## 2023-04-17 DIAGNOSIS — F419 Anxiety disorder, unspecified: Secondary | ICD-10-CM | POA: Diagnosis present

## 2023-04-17 DIAGNOSIS — Z8616 Personal history of COVID-19: Secondary | ICD-10-CM | POA: Diagnosis not present

## 2023-04-17 DIAGNOSIS — S065XAA Traumatic subdural hemorrhage with loss of consciousness status unknown, initial encounter: Secondary | ICD-10-CM | POA: Diagnosis present

## 2023-04-17 DIAGNOSIS — I10 Essential (primary) hypertension: Secondary | ICD-10-CM | POA: Diagnosis present

## 2023-04-17 DIAGNOSIS — E785 Hyperlipidemia, unspecified: Secondary | ICD-10-CM | POA: Diagnosis not present

## 2023-04-17 DIAGNOSIS — Z87891 Personal history of nicotine dependence: Secondary | ICD-10-CM

## 2023-04-17 DIAGNOSIS — Z79899 Other long term (current) drug therapy: Secondary | ICD-10-CM | POA: Diagnosis not present

## 2023-04-17 DIAGNOSIS — Z951 Presence of aortocoronary bypass graft: Secondary | ICD-10-CM

## 2023-04-17 DIAGNOSIS — I4891 Unspecified atrial fibrillation: Secondary | ICD-10-CM | POA: Diagnosis present

## 2023-04-17 DIAGNOSIS — I2510 Atherosclerotic heart disease of native coronary artery without angina pectoris: Secondary | ICD-10-CM | POA: Diagnosis present

## 2023-04-17 DIAGNOSIS — Z886 Allergy status to analgesic agent status: Secondary | ICD-10-CM

## 2023-04-17 DIAGNOSIS — I6203 Nontraumatic chronic subdural hemorrhage: Secondary | ICD-10-CM | POA: Diagnosis not present

## 2023-04-17 DIAGNOSIS — I7143 Infrarenal abdominal aortic aneurysm, without rupture: Secondary | ICD-10-CM | POA: Diagnosis present

## 2023-04-17 DIAGNOSIS — Z881 Allergy status to other antibiotic agents status: Secondary | ICD-10-CM

## 2023-04-17 DIAGNOSIS — I6202 Nontraumatic subacute subdural hemorrhage: Secondary | ICD-10-CM | POA: Diagnosis not present

## 2023-04-17 DIAGNOSIS — Z5309 Procedure and treatment not carried out because of other contraindication: Secondary | ICD-10-CM | POA: Diagnosis present

## 2023-04-17 DIAGNOSIS — J449 Chronic obstructive pulmonary disease, unspecified: Secondary | ICD-10-CM | POA: Diagnosis present

## 2023-04-17 DIAGNOSIS — I480 Paroxysmal atrial fibrillation: Secondary | ICD-10-CM | POA: Diagnosis not present

## 2023-04-17 HISTORY — PX: IR ANGIO INTRA EXTRACRAN SEL COM CAROTID INNOMINATE UNI L MOD SED: IMG5358

## 2023-04-17 HISTORY — PX: IR NEURO EACH ADD'L AFTER BASIC UNI LEFT (MS): IMG5373

## 2023-04-17 HISTORY — PX: IR ANGIO EXTERNAL CAROTID SEL EXT CAROTID UNI L MOD SED: IMG5370

## 2023-04-17 HISTORY — PX: RADIOLOGY WITH ANESTHESIA: SHX6223

## 2023-04-17 SURGERY — IR WITH ANESTHESIA
Anesthesia: General

## 2023-04-17 MED ORDER — IOHEXOL 300 MG/ML  SOLN
150.0000 mL | Freq: Once | INTRAMUSCULAR | Status: AC | PRN
  Administered 2023-04-17: 36 mL via INTRA_ARTERIAL

## 2023-04-17 MED ORDER — PROPOFOL 10 MG/ML IV BOLUS
INTRAVENOUS | Status: AC
  Filled 2023-04-17: qty 20

## 2023-04-17 MED ORDER — CHLORHEXIDINE GLUCONATE CLOTH 2 % EX PADS: 6.0000 | MEDICATED_PAD | Freq: Once | CUTANEOUS | Status: DC

## 2023-04-17 MED ORDER — ORAL CARE MOUTH RINSE: 15.0000 mL | Freq: Once | OROMUCOSAL | Status: AC

## 2023-04-17 MED ORDER — SUGAMMADEX SODIUM 200 MG/2ML IV SOLN
INTRAVENOUS | Status: DC | PRN
  Administered 2023-04-17: 200 mg via INTRAVENOUS

## 2023-04-17 MED ORDER — ACETAMINOPHEN 10 MG/ML IV SOLN: 1000.0000 mg | Freq: Once | INTRAVENOUS | Status: DC | PRN

## 2023-04-17 MED ORDER — CHLORHEXIDINE GLUCONATE 0.12 % MT SOLN
15.0000 mL | Freq: Once | OROMUCOSAL | Status: AC
  Administered 2023-04-17: 15 mL via OROMUCOSAL
  Filled 2023-04-17: qty 15

## 2023-04-17 MED ORDER — FENTANYL CITRATE (PF) 100 MCG/2ML IJ SOLN: 25.0000 ug | INTRAMUSCULAR | Status: DC | PRN

## 2023-04-17 MED ORDER — HEPARIN SODIUM (PORCINE) 1000 UNIT/ML IJ SOLN
INTRAMUSCULAR | Status: DC | PRN
Start: 2023-04-17 — End: 2023-04-17
  Administered 2023-04-17: 3000 [IU] via INTRAVENOUS

## 2023-04-17 MED ORDER — ONDANSETRON HCL 4 MG/2ML IJ SOLN: 4.0000 mg | Freq: Once | INTRAMUSCULAR | Status: DC | PRN

## 2023-04-17 MED ORDER — DEXAMETHASONE SODIUM PHOSPHATE 10 MG/ML IJ SOLN
INTRAMUSCULAR | Status: DC | PRN
  Administered 2023-04-17: 5 mg via INTRAVENOUS

## 2023-04-17 MED ORDER — PHENYLEPHRINE 80 MCG/ML (10ML) SYRINGE FOR IV PUSH (FOR BLOOD PRESSURE SUPPORT)
PREFILLED_SYRINGE | INTRAVENOUS | Status: DC | PRN
  Administered 2023-04-17: 160 ug via INTRAVENOUS
  Administered 2023-04-17: 80 ug via INTRAVENOUS

## 2023-04-17 MED ORDER — FENTANYL CITRATE (PF) 250 MCG/5ML IJ SOLN
INTRAMUSCULAR | Status: AC
  Filled 2023-04-17: qty 5

## 2023-04-17 MED ORDER — OXYCODONE HCL 5 MG/5ML PO SOLN: 5.0000 mg | Freq: Once | ORAL | Status: DC | PRN

## 2023-04-17 MED ORDER — CEFAZOLIN SODIUM-DEXTROSE 2-4 GM/100ML-% IV SOLN
2.0000 g | INTRAVENOUS | Status: DC
  Filled 2023-04-17: qty 100

## 2023-04-17 MED ORDER — ONDANSETRON HCL 4 MG/2ML IJ SOLN
INTRAMUSCULAR | Status: DC | PRN
Start: 2023-04-17 — End: 2023-04-17
  Administered 2023-04-17: 4 mg via INTRAVENOUS

## 2023-04-17 MED ORDER — LACTATED RINGERS IV SOLN: INTRAVENOUS | Status: DC

## 2023-04-17 MED ORDER — PROPOFOL 10 MG/ML IV BOLUS
INTRAVENOUS | Status: DC | PRN
  Administered 2023-04-17: 80 mg via INTRAVENOUS

## 2023-04-17 MED ORDER — LABETALOL HCL 5 MG/ML IV SOLN
INTRAVENOUS | Status: DC | PRN
Start: 2023-04-17 — End: 2023-04-17
  Administered 2023-04-17: 2.5 mg via INTRAVENOUS

## 2023-04-17 MED ORDER — OXYCODONE HCL 5 MG PO TABS: 5.0000 mg | ORAL_TABLET | Freq: Once | ORAL | Status: DC | PRN

## 2023-04-17 MED ORDER — FENTANYL CITRATE (PF) 250 MCG/5ML IJ SOLN
INTRAMUSCULAR | Status: DC | PRN
  Administered 2023-04-17: 100 ug via INTRAVENOUS
  Administered 2023-04-17: 50 ug via INTRAVENOUS

## 2023-04-17 MED ORDER — ROCURONIUM BROMIDE 10 MG/ML (PF) SYRINGE
PREFILLED_SYRINGE | INTRAVENOUS | Status: DC | PRN
  Administered 2023-04-17: 10 mg via INTRAVENOUS
  Administered 2023-04-17: 60 mg via INTRAVENOUS
  Administered 2023-04-17: 10 mg via INTRAVENOUS

## 2023-04-17 MED ORDER — ESMOLOL HCL 100 MG/10ML IV SOLN
INTRAVENOUS | Status: DC | PRN
  Administered 2023-04-17: 30 mg via INTRAVENOUS
  Administered 2023-04-17: 50 mg via INTRAVENOUS

## 2023-04-17 MED ORDER — LIDOCAINE 2% (20 MG/ML) 5 ML SYRINGE
INTRAMUSCULAR | Status: DC | PRN
  Administered 2023-04-17: 60 mg via INTRAVENOUS

## 2023-04-17 NOTE — H&P (Signed)
Chief Complaint   Subdural hematoma  History of Present Illness  [name] is a 77 y.o. male with a history of chronic subdural hematoma.  He underwent bur hole for evacuation approximately 2 weeks ago.  Repeat scan demonstrated some residual subdural hematoma.  He was therefore referred for middle meningeal artery embolization.  Past Medical History   Past Medical History:  Diagnosis Date   AAA (abdominal aortic aneurysm) (HCC)    06/27/20 MRI: 3.0 cm infrarenal AAA, recommend 3 year follow-up   Anxiety    Atrial fibrillation (HCC)    Bilateral carotid artery disease (HCC) 07/21/2013   CAD (coronary artery disease) 2008   CABG   COPD (chronic obstructive pulmonary disease) (HCC)    Dysrhythmia    A-fib   Gastritis    Headache(784.0)    History of hiatal hernia    Hypertension    SDH (subdural hematoma) (HCC) 01/31/2023   Visit for monitoring Tikosyn therapy 12/2015    Past Surgical History   Past Surgical History:  Procedure Laterality Date   BURR HOLE Left 03/27/2023   Procedure: BURR HOLE EVASCUATION OF SUBDURAL HEMATOMA;  Surgeon: Jadene Pierini, MD;  Location: [facility] OR;  Service: Neurosurgery;  Laterality: Left;   CARDIOVERSION N/A 09/08/2015   Procedure: CARDIOVERSION;  Surgeon: Vesta Mixer, MD;  Location: [facility] ENDOSCOPY;  Service: Cardiovascular;  Laterality: N/A;   CORONARY ARTERY BYPASS GRAFT     EXCISION OF KELOID Left 10/08/2018   Procedure: EXCISION OF CHRONIC ABDOMINAL WALL WOUND;  Surgeon: Abigail Miyamoto, MD;  Location: [facility];  Service: General;  Laterality: Left;   INCISION AND DRAINAGE DEEP NECK ABSCESS     LUMBAR LAMINECTOMY/DECOMPRESSION MICRODISCECTOMY Right 08/09/2020   Procedure: Laminectomy and Foraminotomy - Lumbar three-Lumbar four - Lumbar four-Lumbar five- right;  Surgeon: Donalee Citrin, MD;  Location: [facility] OR;  Service: Neurosurgery;  Laterality: Right;   ROTATOR CUFF REPAIR Bilateral    TEE WITHOUT CARDIOVERSION N/A  08/02/2015   Procedure: TRANSESOPHAGEAL ECHOCARDIOGRAM (TEE);  Surgeon: Quintella Reichert, MD;  Location: [facility] ENDOSCOPY;  Service: Cardiovascular;  Laterality: N/A;   TEE WITHOUT CARDIOVERSION N/A 09/08/2015   Procedure: TRANSESOPHAGEAL ECHOCARDIOGRAM (TEE);  Surgeon: Vesta Mixer, MD;  Location: [facility] ENDOSCOPY;  Service: Cardiovascular;  Laterality: N/A;    Social History   Social History   Tobacco Use   Smoking status: Former    Current packs/day: 0.00    Types: Cigarettes    Quit date: 1960    Years since quitting: 64.6   Smokeless tobacco: Former    Types: Chew    Quit date: 08/2022  Vaping Use   Vaping status: Never Used  Substance Use Topics   Alcohol use: No    Comment: quit drinking in 1971   Drug use: No    Medications   Prior to Admission medications   Medication Sig Start Date End Date Taking? Authorizing Provider  acetaminophen (TYLENOL) 500 MG tablet Take 1 tablet (500 mg total) by mouth every 6 (six) hours for 8 days then as directed by MD 04/04/23  Yes Dahbura, Isidoro Donning, DO  ALPRAZolam (XANAX) 0.25 MG tablet TAKE 1 TABLET(0.25 MG) BY MOUTH DAILY Patient taking differently: Take 0.25 mg by mouth at bedtime. 11/12/20  Yes Ladona Ridgel, Malena M, DO  Ascorbic Acid (VITAMIN C) 500 MG CHEW Chew 500 mg by mouth daily.   Yes [provider]  atorvastatin (LIPITOR) 40 MG tablet TAKE 1 TABLET(40 MG) BY MOUTH DAILY Patient taking differently: Take  40 mg by mouth daily. 06/03/20  Yes Ladona Ridgel, Malena M, DO  diltiazem (CARDIZEM) 30 MG tablet Take 1 tablet every 4 hours AS NEEDED for heart rate >100 as long as blood pressure >100. Patient taking differently: Take 30 mg by mouth See admin instructions. Take 1 tablet every 4 hours AS NEEDED for heart rate >100 as long as blood pressure >100. 10/25/22  Yes Newman Nip, NP  dofetilide (TIKOSYN) 250 MCG capsule Take 1 capsule (250 mcg total) by mouth 2 (two) times daily. 03/28/23  Yes Westley Chandler, MD  lisinopril (ZESTRIL) 20 MG  tablet TAKE 1 TABLET(20 MG) BY MOUTH DAILY Patient taking differently: Take 20 mg by mouth daily. TAKE 1 TABLET(20 MG) BY MOUTH DAILY 06/03/20  Yes Ladona Ridgel, Malena M, DO  Multiple Vitamins-Minerals (MULTIVITAMIN WITH MINERALS) tablet Take 1 tablet by mouth at bedtime.   Yes [provider]  Omega-3 Fatty Acids (FISH OIL) 1000 MG CAPS Take 1,000 mg by mouth daily.   Yes [provider]  potassium chloride (KLOR-CON) 10 MEQ tablet TAKE 1 TABLET(10 MEQ) BY MOUTH DAILY Patient taking differently: Take 10 mEq by mouth every evening. 11/22/22  Yes Fenton, Clint R, PA  nitroGLYCERIN (NITROSTAT) 0.4 MG SL tablet Place 1 tablet (0.4 mg total) under the tongue every 5 (five) minutes as needed for chest pain. 02/09/23   Jake Bathe, MD    Allergies   Allergies  Allergen Reactions   Doxycycline Hives and Shortness Of Breath    Also has to urinate more often   Nsaids Hypertension    Gastritis     Review of Systems  ROS  Neurologic Exam  Awake, alert, oriented Memory and concentration grossly intact Speech fluent, appropriate CN grossly intact Motor exam: Upper Extremities Deltoid Bicep Tricep Grip  Right 5/5 5/5 5/5 5/5  Left 5/5 5/5 5/5 5/5   Lower Extremities IP Quad PF DF EHL  Right 5/5 5/5 5/5 5/5 5/5  Left 5/5 5/5 5/5 5/5 5/5   Sensation grossly intact to LT  Imaging  MRI does demonstrate nearly 2 cm left convexity chronic subdural hematoma.  Impression  - 78 y.o. male, clinically well 2 weeks status post left bur hole for evacuation of chronic subdural hematoma with residual on follow-up imaging.  Plan  -We will plan on proceeding with left middle meningeal artery embolization.  I have reviewed the imaging findings and the indications for the embolization procedure with the patient and his wife.  We have discussed the details of the procedure and the expected postoperative course and recovery.  We have also discussed the risks of this procedure to include risk  of stroke, arterial dissection, contrast nephropathy, and groin hematoma.  All her questions today were answered.  The patient provided informed consent to proceed.  Lisbeth Renshaw, MD [facility]

## 2023-04-17 NOTE — Sedation Documentation (Signed)
Care per anesthesia; Thayer Ohm, CRNA at bedside at this time. See CRNA documentation.

## 2023-04-17 NOTE — Discharge Summary (Signed)
Physician Discharge Summary  Patient ID: [name] MRN: [id number] DOB/AGE: December 12, 1945 77 y.o.  Admit date: 04/17/2023 Discharge date: 04/17/2023  Admission Diagnoses:  Subdural hematoma  Discharge Diagnoses:  Same Principal Problem:   Cerebral aneurysm Active Problems:   Subdural hematoma [facility])   Discharged Condition: Stable  Hospital Course:  [name] is a 77 y.o. male who underwent attempted middle meningeal artery embolization unsuccessfully.  Patient was monitored in the postanesthesia care unit and was at neurologic baseline.  He was ambulating at baseline, without any femoral access site issues.  He was therefore discharged in stable condition.  Treatments: Attempted endovascular embolization of left middle meningeal artery  Discharge Exam: Blood pressure 119/81, pulse (!) 109, temperature 97.6 F (36.4 C), resp. rate 15, height 5\' 5"  (1.651 m), weight 62.6 kg, SpO2 93%. Awake, alert, oriented Speech fluent, appropriate CN grossly intact 5/5 BUE/BLE Wound c/d/i  Disposition: Discharge disposition: 01-Home or Self Care       Discharge Instructions     Call MD for:  redness, tenderness, or signs of infection (pain, swelling, redness, odor or green/yellow discharge around incision site)   Complete by: As directed    Call MD for:  temperature >100.4   Complete by: As directed    Diet - low sodium heart healthy   Complete by: As directed    Discharge instructions   Complete by: As directed    Walk at home as much as possible, at least 4 times / day   Increase activity slowly   Complete by: As directed    Lifting restrictions   Complete by: As directed    No lifting > 10 lbs   May shower / Bathe   Complete by: As directed    48 hours after surgery   May walk up steps   Complete by: As directed    No wound care   Complete by: As directed    Other Restrictions   Complete by: As directed    No bending/twisting at waist      Allergies as of  04/17/2023       Reactions   Doxycycline Hives, Shortness Of Breath   Also has to urinate more often   Nsaids Hypertension   Gastritis         Medication List     TAKE these medications    Acetaminophen Extra Strength 500 MG Tabs Take 1 tablet (500 mg total) by mouth every 6 (six) hours for 8 days then as directed by MD   ALPRAZolam 0.25 MG tablet Commonly known as: XANAX TAKE 1 TABLET(0.25 MG) BY MOUTH DAILY What changed: See the new instructions.   atorvastatin 40 MG tablet Commonly known as: LIPITOR TAKE 1 TABLET(40 MG) BY MOUTH DAILY What changed:  how much to take how to take this when to take this additional instructions   diltiazem 30 MG tablet Commonly known as: Cardizem Take 1 tablet every 4 hours AS NEEDED for heart rate >100 as long as blood pressure >100. What changed:  how much to take how to take this when to take this   dofetilide 250 MCG capsule Commonly known as: TIKOSYN Take 1 capsule (250 mcg total) by mouth 2 (two) times daily.   Fish Oil 1000 MG Caps Take 1,000 mg by mouth daily.   lisinopril 20 MG tablet Commonly known as: ZESTRIL TAKE 1 TABLET(20 MG) BY MOUTH DAILY What changed:  how much to take how to take this when to take  this   multivitamin with minerals tablet Take 1 tablet by mouth at bedtime.   nitroGLYCERIN 0.4 MG SL tablet Commonly known as: NITROSTAT Place 1 tablet (0.4 mg total) under the tongue every 5 (five) minutes as needed for chest pain.   potassium chloride 10 MEQ tablet Commonly known as: KLOR-CON TAKE 1 TABLET(10 MEQ) BY MOUTH DAILY What changed:  how much to take how to take this when to take this additional instructions   Vitamin C 500 MG Chew Chew 500 mg by mouth daily.        Follow-up Information     Jadene Pierini, MD Follow up in 2 week(s).   Specialty: Neurosurgery Contact information: [address] [place] [address] [phone]                  Signed: Jackelyn Hoehn 04/17/2023, 12:43 PM

## 2023-04-17 NOTE — Anesthesia Procedure Notes (Signed)
Procedure Name: Intubation Date/Time: 04/17/2023 9:08 AM  Performed by: Gus Puma, CRNAPre-anesthesia Checklist: Patient identified, Emergency Drugs available, Suction available and Patient being monitored Patient Re-evaluated:Patient Re-evaluated prior to induction Oxygen Delivery Method: Circle System Utilized Preoxygenation: Pre-oxygenation with 100% oxygen Induction Type: IV induction Ventilation: Mask ventilation without difficulty Laryngoscope Size: Mac and 4 Grade View: Grade I Tube type: Oral Tube size: 7.5 mm Number of attempts: 1 Airway Equipment and Method: Stylet and Oral airway Placement Confirmation: ETT inserted through vocal cords under direct vision, positive ETCO2 and breath sounds checked- equal and bilateral Secured at: 23 cm Tube secured with: Tape Dental Injury: Teeth and Oropharynx as per pre-operative assessment

## 2023-04-17 NOTE — Transfer of Care (Signed)
Immediate Anesthesia Transfer of Care Note  Patient: [name]  Procedure(s) Performed: Left MMA IR ANGIO EXTERNAL CAROTID SEL EXT CAROTID UNI L MOD SED IR NEURO EACH ADD'L AFTER BASIC UNI LEFT (MS)  Patient Location: PACU  Anesthesia Type:General  Level of Consciousness: awake, alert , oriented, and patient cooperative  Airway & Oxygen Therapy: Patient Spontanous Breathing and Patient connected to nasal cannula oxygen  Post-op Assessment: Report given to RN, Post -op Vital signs reviewed and stable, and Patient moving all extremities X 4  Post vital signs: Reviewed and stable  Last Vitals:  Vitals Value Taken Time  BP 149/108 04/17/23 1111  Temp    Pulse 113 04/17/23 1115  Resp 16 04/17/23 1115  SpO2 90 % 04/17/23 1115  Vitals shown include unfiled device data.  Last Pain:  Vitals:   04/17/23 0716  TempSrc:   PainSc: 0-No pain         Complications: No notable events documented.

## 2023-04-18 ENCOUNTER — Encounter (HOSPITAL_COMMUNITY): Payer: Self-pay | Admitting: Neurosurgery

## 2023-04-18 NOTE — Anesthesia Postprocedure Evaluation (Signed)
Anesthesia Post Note  Patient: CELESTINE LAVIOLA  Procedure(s) Performed: IR ANGIO EXTERNAL CAROTID SEL EXT CAROTID UNI L MOD SED IR NEURO EACH ADD'L AFTER BASIC UNI LEFT (MS) IR ANGIO INTRA EXTRACRAN SEL COM CAROTID INNOMINATE UNI L MOD SED Left MMA     Patient location during evaluation: PACU Anesthesia Type: General Level of consciousness: awake and alert Pain management: pain level controlled Vital Signs Assessment: post-procedure vital signs reviewed and stable Respiratory status: spontaneous breathing, nonlabored ventilation, respiratory function stable and patient connected to nasal cannula oxygen Cardiovascular status: blood pressure returned to baseline and stable Postop Assessment: no apparent nausea or vomiting Anesthetic complications: no   No notable events documented.  Last Vitals:  Vitals:   04/17/23 1400 04/17/23 1530  BP: 109/75 114/75  Pulse: (!) 105 (!) 103  Resp: 18 12  Temp:  36.4 C  SpO2: 92% 92%    Last Pain:  Vitals:   04/17/23 1530  TempSrc:   PainSc: 0-No pain                 Mariann Barter

## 2023-04-19 ENCOUNTER — Other Ambulatory Visit (HOSPITAL_COMMUNITY): Payer: Self-pay

## 2023-04-19 MED ORDER — DOFETILIDE 250 MCG PO CAPS: 250.0000 ug | ORAL_CAPSULE | Freq: Two times a day (BID) | ORAL | 6 refills | Status: DC

## 2023-04-20 ENCOUNTER — Ambulatory Visit (HOSPITAL_COMMUNITY)
Admission: RE | Admit: 2023-04-20 | Discharge: 2023-04-20 | Disposition: A | Discharge status: Home / Self Care | Payer: Medicare Other | Source: Ambulatory Visit | Attending: Internal Medicine | Admitting: Internal Medicine

## 2023-04-20 ENCOUNTER — Encounter (HOSPITAL_COMMUNITY): Payer: Self-pay | Admitting: Internal Medicine

## 2023-04-20 VITALS — BP 138/84 | HR 133 | Ht 65.0 in | Wt 148.0 lb

## 2023-04-20 DIAGNOSIS — Z7901 Long term (current) use of anticoagulants: Secondary | ICD-10-CM | POA: Diagnosis not present

## 2023-04-20 DIAGNOSIS — I110 Hypertensive heart disease with heart failure: Secondary | ICD-10-CM | POA: Diagnosis not present

## 2023-04-20 DIAGNOSIS — D6869 Other thrombophilia: Secondary | ICD-10-CM | POA: Insufficient documentation

## 2023-04-20 DIAGNOSIS — I2510 Atherosclerotic heart disease of native coronary artery without angina pectoris: Secondary | ICD-10-CM | POA: Insufficient documentation

## 2023-04-20 DIAGNOSIS — I509 Heart failure, unspecified: Secondary | ICD-10-CM | POA: Insufficient documentation

## 2023-04-20 DIAGNOSIS — I480 Paroxysmal atrial fibrillation: Secondary | ICD-10-CM | POA: Diagnosis not present

## 2023-04-20 DIAGNOSIS — Z951 Presence of aortocoronary bypass graft: Secondary | ICD-10-CM | POA: Insufficient documentation

## 2023-04-20 DIAGNOSIS — Z79899 Other long term (current) drug therapy: Secondary | ICD-10-CM | POA: Insufficient documentation

## 2023-04-20 MED ORDER — METOPROLOL SUCCINATE ER 25 MG PO TB24: 25.0000 mg | ORAL_TABLET | Freq: Every day | ORAL | 3 refills | Status: DC

## 2023-04-20 NOTE — Progress Notes (Addendum)
Primary Care Physician: Benita Stabile, MD Referring Physician:Dr. Jadarien Brisk is a 77 y.o. male with a h/o PAF that underwent tikosyn loading at [facility] 01/10/16 . He has held in SR very nicely . His grandson dies last week and after the service did noticed some fast irregular heart besat but only lasted around one hour.   No change form his usual health. 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 Tries to stay hydrated. Has gotten both covid shots.   BP elevated today but at home systolic is around 130-140 systolic. Just took meds about an hour ago. Heart rate is in the upper 40's today. Not symptomatic with this. He is not on AV nodal drugs. He sees HR 's in the 60's at home.    F/u in afib clinic, 09/10/19. He is s/p decompressive laminectomy  12/13. He is recovering nicely. He is staying in SR. Qt stable. He is continuing on Tikosyn 500 mcg bid.   F/u in afib clinic, 03/08/21 for Tikosyn surveillance. He reports that he is staying in rhythm. Continues on Tikosyn. No issues with xarelto 20 mg daily for a CHA2DS2VASc  of 4. He had back surgery in December with relief of pain and had covid in February 2022 and did not require hospitalization. Mostly had cough. No pneumonia by CXR.   F/u afib clinic, 09/21/21 for tikosyn surveillance. He contiues in SR. No afib to report. Compliant with tikosyn. He is still working full time as an Personnel officer. Stays very busy.   F/u in afib clinic, 02/23/22. He is in for tikosyn surveillance and has no afib to report. He has developed some nerve pain of rt hip which has been bothersome for him. Still working as hard as ever.   F/u in afib clinic, 08/24/22. He is in afib today. He had Covid in November and took around 3 days of paxlovid( which is contraindicated in covid).He does not know when he went into afib but noted an elevated HR last night. He did take an 30 mg Cardizem last night. He continues on xarelto 20 mg  daily. He feels his rhythm has not been right for around 2 weeks. 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 He states he has never had such  a busy fall season in his life. He is working long hours. He is  trying to move his son here to help.   Follow up in the AF clinic 02/09/23. Patient presented to the ED at [facility] 01/31/23 after falling off a trailer and hitting his head. He was found to have a SDH. Repeat CT was stable. He was discharged with neurology follow up. His Xarelto was held. His BB was discontinued due to bradycardia. He has noticed his BP reading at home have been more elevated.   On follow up 04/20/23, he is currently in Afib with RVR. Recent hospital admission 8/5-9/24 for right sided weakness and Covid-19 infection. ECG on 8/5 shows likely Afib with RVR. He underwent attempted middle meningeal artery embolization unsuccessfully on 8/20. HR at time of discharge exam noted to be 109 bpm. Previous hospital admission for subdural hematoma 7/29-31 his Xarelto was discontinued and he underwent dose reduction of Tikosyn from 500 to 250 mcg BID due to creatinine clearance. It appears he has had breakthrough Afib post dose reduction. Not on anticoagulation.   Today, he denies symptoms of  palpitations, chest pain, shortness of breath, orthopnea, PND, lower extremity edema, dizziness, presyncope, syncope, or neurologic sequela. The patient is tolerating medications without difficulties and is otherwise without complaint today.   Past Medical History:  Diagnosis Date   AAA (abdominal aortic aneurysm) (HCC)    06/27/20 MRI: 3.0 cm infrarenal AAA, recommend 3 year follow-up   Anxiety    Atrial fibrillation (HCC)    Bilateral carotid artery disease (HCC) 07/21/2013   CAD (coronary artery disease) 2008   CABG   COPD (chronic obstructive pulmonary disease) (HCC)    Dysrhythmia    A-fib   Gastritis    Headache(784.0)    History of hiatal  hernia    Hypertension    SDH (subdural hematoma) (HCC) 01/31/2023   Visit for monitoring Tikosyn therapy 12/2015   Past Surgical History:  Procedure Laterality Date   BURR HOLE Left 03/27/2023   Procedure: BURR HOLE EVASCUATION OF SUBDURAL HEMATOMA;  Surgeon: Jadene Pierini, MD;  Location: [facility] OR;  Service: Neurosurgery;  Laterality: Left;   CARDIOVERSION N/A 09/08/2015   Procedure: CARDIOVERSION;  Surgeon: Vesta Mixer, MD;  Location: [facility] ENDOSCOPY;  Service: Cardiovascular;  Laterality: N/A;   CORONARY ARTERY BYPASS GRAFT     EXCISION OF KELOID Left 10/08/2018   Procedure: EXCISION OF CHRONIC ABDOMINAL WALL WOUND;  Surgeon: Abigail Miyamoto, MD;  Location: [facility];  Service: General;  Laterality: Left;   INCISION AND DRAINAGE DEEP NECK ABSCESS     IR ANGIO EXTERNAL CAROTID SEL EXT CAROTID UNI L MOD SED  04/17/2023   IR ANGIO INTRA EXTRACRAN SEL COM CAROTID INNOMINATE UNI L MOD SED  04/17/2023   IR NEURO EACH ADD'L AFTER BASIC UNI LEFT (MS)  04/17/2023   LUMBAR LAMINECTOMY/DECOMPRESSION MICRODISCECTOMY Right 08/09/2020   Procedure: Laminectomy and Foraminotomy - Lumbar three-Lumbar four - Lumbar four-Lumbar five- right;  Surgeon: Donalee Citrin, MD;  Location: [facility] OR;  Service: Neurosurgery;  Laterality: Right;   RADIOLOGY WITH ANESTHESIA N/A 04/17/2023   Procedure: Left MMA;  Surgeon: Lisbeth Renshaw, MD;  Location: [facility] OR;  Service: Radiology;  Laterality: N/A;   ROTATOR CUFF REPAIR Bilateral    TEE WITHOUT CARDIOVERSION N/A 08/02/2015   Procedure: TRANSESOPHAGEAL ECHOCARDIOGRAM (TEE);  Surgeon: Quintella Reichert, MD;  Location: [facility] ENDOSCOPY;  Service: Cardiovascular;  Laterality: N/A;   TEE WITHOUT CARDIOVERSION N/A 09/08/2015   Procedure: TRANSESOPHAGEAL ECHOCARDIOGRAM (TEE);  Surgeon: Vesta Mixer, MD;  Location: [facility] ENDOSCOPY;  Service: Cardiovascular;  Laterality: N/A;    Current Outpatient Medications  Medication Sig Dispense Refill   acetaminophen (TYLENOL) 500  MG tablet Take 1 tablet (500 mg total) by mouth every 6 (six) hours for 8 days then as directed by MD 100 tablet 0   ALPRAZolam (XANAX) 0.25 MG tablet TAKE 1 TABLET(0.25 MG) BY MOUTH DAILY (Patient taking differently: Take 0.25 mg by mouth at bedtime.) 10 tablet 0   Ascorbic Acid (VITAMIN C) 500 MG CHEW Chew 500 mg by mouth daily.     atorvastatin (LIPITOR) 40 MG tablet TAKE 1 TABLET(40 MG) BY MOUTH DAILY (Patient taking differently: Take 40 mg by mouth daily.) 90 tablet 1   diltiazem (CARDIZEM) 30 MG tablet Take 1 tablet every 4 hours AS NEEDED for heart rate >100 as long as blood pressure >100. (Patient taking differently: Take 30 mg by mouth See admin instructions. Take 1 tablet every 4 hours AS NEEDED for heart rate >100 as long as blood pressure >100.) 45 tablet 1   dofetilide (TIKOSYN) 250  MCG capsule Take 1 capsule (250 mcg total) by mouth 2 (two) times daily. 60 capsule 6   lisinopril (ZESTRIL) 20 MG tablet TAKE 1 TABLET(20 MG) BY MOUTH DAILY (Patient taking differently: Take 20 mg by mouth daily. TAKE 1 TABLET(20 MG) BY MOUTH DAILY) 90 tablet 1   metoprolol succinate (TOPROL XL) 25 MG 24 hr tablet Take 1 tablet (25 mg total) by mouth daily. 30 tablet 3   Multiple Vitamins-Minerals (MULTIVITAMIN WITH MINERALS) tablet Take 1 tablet by mouth at bedtime.     nitroGLYCERIN (NITROSTAT) 0.4 MG SL tablet Place 1 tablet (0.4 mg total) under the tongue every 5 (five) minutes as needed for chest pain. 10 tablet 3   Omega-3 Fatty Acids (FISH OIL) 1000 MG CAPS Take 1,000 mg by mouth daily.     potassium chloride (KLOR-CON) 10 MEQ tablet TAKE 1 TABLET(10 MEQ) BY MOUTH DAILY (Patient taking differently: Take 10 mEq by mouth every evening.) 90 tablet 2   No current facility-administered medications for this encounter.    Allergies  Allergen Reactions   Doxycycline Hives and Shortness Of Breath    Also has to urinate more often   Nsaids Hypertension    Gastritis    ROS- All systems are reviewed and  negative except as per the HPI above  Physical Exam: Vitals:   04/20/23 1046  BP: 138/84  Pulse: (!) 133  Weight: 67.1 kg  Height: 5\' 5"  (1.651 m)    GEN- The patient is well appearing, alert and oriented x 3 today.   Neck - no JVD or carotid bruit noted Lungs- Clear to ausculation bilaterally, normal work of breathing Heart- Irregular tachycardic rate and rhythm, no murmurs, rubs or gallops, PMI not laterally displaced Extremities- no clubbing, cyanosis, or edema Skin - no rash or ecchymosis noted   EKG today demonstrates Vent. rate 133 BPM PR interval * ms QRS duration 82 ms QT/QTcB 310/461 ms P-R-T axes * 36 84 Atrial fibrillation with rapid ventricular response with premature ventricular or aberrantly conducted complexes Nonspecific T wave abnormality Abnormal ECG When compared with ECG of 02-Apr-2023 22:42, PREVIOUS ECG IS PRESENT  ECHO 12/29/15: Study Conclusions   - Left ventricle: The cavity size was normal. Wall thickness was    normal. Systolic function was mildly reduced. The estimated    ejection fraction was in the range of 45% to 50%. Abnormal GLPSS    at -13% with inferoseptal strain abnormality. The study is not    technically sufficient to allow evaluation of LV diastolic    function.  - Mitral valve: Mildly thickened leaflets . There was trivial    regurgitation.  - Left atrium: Severely dilated at 51 ml/m2.  - Right ventricle: The cavity size was mildly dilated.  - Right atrium: The atrium was mildly dilated.  - Tricuspid valve: There was trivial regurgitation.  - Pulmonary arteries: PA peak pressure: 21 mm Hg (S).  - Inferior vena cava: The vessel was normal in size. The    respirophasic diameter changes were in the normal range (>= 50%),    consistent with normal central venous pressure.   Epic records reviewed   CHA2DS2-VASc Score = 4  The patient's score is based upon: CHF History: 0 HTN History: 1 Diabetes History: 0 Stroke History:  0 Vascular Disease History: 1 Age Score: 2 Gender Score: 0       ASSESSMENT AND PLAN: 1. Paroxysmal Atrial Fibrillation (ICD10:  I48.0) (suspect persistent) The patient's CHA2DS2-VASc score is 4, indicating a  4.8% annual risk of stroke.    He is in Afib with RVR. Unfortunately, he had a subdural hematoma and required discontinuation of Xarelto. He also required dose reduction of Tikosyn due to adjustment for creatinine clearance and appears to have breakthrough Afib on lower dose of 250 mcg BID.  I will restart his beta blocker and begin Toprol 25 mg daily. This can be adjusted pending HR at upcoming visit with Cardiology.  I will also have patient speak with Dr. Lalla Brothers regarding the potential for being a Watchman candidate. At this specific time, it appears unlikely due to no clear timetable for when/if can resume anticoagulation per Neurosurgery.  Continue dofetilide 250 mg BID. QT stable.  Currently off Xarelto with recent subdural hematoma.  Recent bmet/mag reviewed.   2. Secondary Hypercoagulable State (ICD10:  D68.69) The patient is at significant risk for stroke/thromboembolism based upon his CHA2DS2-VASc Score of 4.  However, the patient is not on anticoagulation due to his high bleeding risk following traumatic SDH. Plan to resume Xarelto per neurology who he sees 6/18.   3. HTN Stable today.   4. CAD S/p CABG 2008    Follow up as scheduled with Cardiology. Will have patient establish with Dr. Lalla Brothers in the event he can be considered for the Watchman procedure in the future.    Lake Bells, PA-C [facility] [address] [place], [place] [address] [phone]

## 2023-04-20 NOTE — Patient Instructions (Addendum)
Start Metoprolol 25mg  once a day    Referral placed for consultation regarding Watchman procedure

## 2023-04-24 ENCOUNTER — Ambulatory Visit: Payer: Medicare Other | Attending: Physician Assistant | Admitting: Physician Assistant

## 2023-04-24 ENCOUNTER — Encounter: Payer: Self-pay | Admitting: Physician Assistant

## 2023-04-24 VITALS — BP 130/70 | HR 64 | Ht 65.0 in | Wt 143.8 lb

## 2023-04-24 DIAGNOSIS — E7800 Pure hypercholesterolemia, unspecified: Secondary | ICD-10-CM

## 2023-04-24 DIAGNOSIS — I10 Essential (primary) hypertension: Secondary | ICD-10-CM | POA: Diagnosis not present

## 2023-04-24 DIAGNOSIS — I25118 Atherosclerotic heart disease of native coronary artery with other forms of angina pectoris: Secondary | ICD-10-CM | POA: Diagnosis not present

## 2023-04-24 DIAGNOSIS — R600 Localized edema: Secondary | ICD-10-CM | POA: Diagnosis not present

## 2023-04-24 DIAGNOSIS — S065XAA Traumatic subdural hemorrhage with loss of consciousness status unknown, initial encounter: Secondary | ICD-10-CM | POA: Diagnosis not present

## 2023-04-24 DIAGNOSIS — I480 Paroxysmal atrial fibrillation: Secondary | ICD-10-CM | POA: Diagnosis not present

## 2023-04-24 DIAGNOSIS — I5022 Chronic systolic (congestive) heart failure: Secondary | ICD-10-CM

## 2023-04-24 DIAGNOSIS — I5032 Chronic diastolic (congestive) heart failure: Secondary | ICD-10-CM | POA: Insufficient documentation

## 2023-04-24 NOTE — Progress Notes (Signed)
Cardiology Office Note:    Date:  04/24/2023  ID:  Jahmez, Stenger Jan 18, 1946, MRN [id number] PCP: Benita Stabile, MD  [facility] Providers Cardiologist:  Donato Schultz, MD       Patient Profile:      Coronary artery disease S/p CABG May 07, 2007 Paroxysmal atrial fibrillation S/p DCCV in 2015-05-07, 05-06-2016  Rx: Dofetilide  HFmrEF (heart failure with mildly reduced ejection fraction)  Tachy mediated?? TEE 09/08/2015: EF 35-40 TTE 01/25/2016: EF 45-50, GLS -13, trivial MR, severe LAE, mild RVE, mild RAE, trivial TR, PASP 21 Abdominal aortic aneurysm  Notes: "05/2020: 3.0 cm"  Carotid artery disease [place] 09/06/22: RICA 60-79; LICA 1-39 Chronic Obstructive Pulmonary Disease Hypertension  Hx of subdural hematoma  FHx of CAD, sudden cardiac death  S/p spine surgery 06-May-2020           Discussed the use of AI scribe software for clinical note transcription with the patient, who gave verbal consent to proceed.  History of Present Illness   The patient, a 77 year old with a history of coronary artery disease, atrial fibrillation, and heart failure with mildly reduced ejection fraction, presents for follow-up after multiple recent hospitalizations. In June 2024, the patient was admitted after a fall resulting in a subdural hematoma. His anticoagulant, Xarelto, was held for a week and then resumed. However, he was readmitted in July with a worsening subdural hematoma, requiring reversal of Xarelto and a burr hole for evacuation. This hospitalization was complicated by recurrent atrial fibrillation, which converted back to sinus rhythm after a dose of dofetilide. His dofetilide dose was reduced due to worsening renal function. In August 2024, the patient was readmitted with right-sided weakness. MRI was negative. Neurosurgery attempted middle meningeal arterial embolization, but this was unsuccessful. His amlodipine was held due to low blood pressure. He was seen in the atrial fibrillation clinic last week,  where he was found to be back in atrial fibrillation with a heart rate in the 130s. Toprol XL 25 mg was added to his medications. He is here with his wife. He is doing well without chest pain, dyspnea, orthopnea, syncope. He has some dependent leg edema that improves with elevation.      ROS:  See HPI. No bleeding issues.    Studies Reviewed:   EKG Interpretation Date/Time:  Tuesday April 24 2023 14:09:15 EDT Ventricular Rate:  66 PR Interval:    QRS Duration:  76 QT Interval:  428 QTC Calculation: 448 R Axis:   2  Text Interpretation: Normal sinus rhythm Premature atrial complexes Anteroseptal infarct Non-specific ST-t changes Similar to prior tracings Confirmed by Tereso Newcomer [id number]) on 04/24/2023 2:27:34 PM    Risk Assessment/Calculations:    CHA2DS2-VASc Score = 4   This indicates a 4.8% annual risk of stroke. The patient's score is based upon: CHF History: 0 HTN History: 1 Diabetes History: 0 Stroke History: 0 Vascular Disease History: 1 Age Score: 2 Gender Score: 0            Physical Exam:   VS:  BP 130/70   Pulse 64   Ht 5\' 5"  (1.651 m)   Wt 143 lb 12.8 oz (65.2 kg)   SpO2 93%   BMI 23.93 kg/m    Wt Readings from Last 3 Encounters:  04/24/23 143 lb 12.8 oz (65.2 kg)  04/20/23 148 lb (67.1 kg)  04/17/23 138 lb (62.6 kg)    Constitutional:      Appearance: Healthy appearance. Not in distress.  Neck:  Vascular: No JVR.  Pulmonary:     Breath sounds: Normal breath sounds. No wheezing. No rales.  Cardiovascular:     Normal rate. Regular rhythm.     Murmurs: There is no murmur.  Edema:    Peripheral edema present.    Pretibial: bilateral trace edema of the pretibial area.    Ankle: bilateral 1+ edema of the ankle. Abdominal:     Palpations: Abdomen is soft.  Skin:    General: Skin is warm and dry.        Assessment and Plan:     Subdural Hematoma Recent history of subdural hematoma following a fall, with subsequent surgical intervention.  Currently off anticoagulation due to the hematoma. Follow-up with neurosurgery planned next week. -Continue current management and follow-up with neurosurgery.  Paroxysmal Atrial Fibrillation Paroxysmal AFib, currently in sinus rhythm. He was in AF with RVR last week but seems to have converted to NSR on Monday of this week. Anticoagulation is currently on hold due to subdural hematoma. Dofetilide dose reduced due to worsening renal function.  -Continue Diltiazem 30 mg every 4 hours as needed for rapid HR, Toprol XL 25 mg daily, Dofetilide 250 mcg twice daily -Referral to EP pending for discussion of left atrial appendage occlusion device.  Heart Failure with Mildly Reduced Ejection Fraction EF 45-50 on Echo in 2017. Volume status stable. NYHA II.  -Continue Lisinopril 20 mg daily, Toprol XL 25 mg daily.  Hypertension BP controlled.  -Continue Lisinopril 20mg  daily and Toprol XL 25mg  daily.  Coronary Artery Disease s/p CABG in 2008. No chest pain to suggest angina. -Continue Lipitor 40mg  daily and nitroglycerin as needed.  Hyperlipidemia LDL 62 in June 2024. This is slightly above target.   -Continue Lipitor 40mg  daily.  Lower Extremity Edema Mild lower extremity edema, likely due to venous insufficiency.  -Advise use of compression stockings and elevation of legs.        Dispo:  Return in about 3 months (around 07/25/2023) for Routine Follow Up w/ Dr. Anne Fu, or Tereso Newcomer, PA-C.  Signed, Tereso Newcomer, PA-C

## 2023-04-24 NOTE — Patient Instructions (Signed)
Medication Instructions:  Your physician recommends that you continue on your current medications as directed. Please refer to the Current Medication list given to you today.  *If you need a refill on your cardiac medications before your next appointment, please call your pharmacy*   Lab Work: None ordered  If you have labs (blood work) drawn today and your tests are completely normal, you will receive your results only by: MyChart Message (if you have MyChart) OR A paper copy in the mail If you have any lab test that is abnormal or we need to change your treatment, we will call you to review the results.   Testing/Procedures: None ordered   Follow-Up: At [facility], you and your health needs are our priority.  As part of our continuing mission to provide you with exceptional heart care, we have created designated Provider Care Teams.  These Care Teams include your primary Cardiologist (physician) and Advanced Practice Providers (APPs -  Physician Assistants and Nurse Practitioners) who all work together to provide you with the care you need, when you need it.  We recommend signing up for the patient portal called "MyChart".  Sign up information is provided on this After Visit Summary.  MyChart is used to connect with patients for Virtual Visits (Telemedicine).  Patients are able to view lab/test results, encounter notes, upcoming appointments, etc.  Non-urgent messages can be sent to your provider as well.   To learn more about what you can do with MyChart, go to [email or web address].    Your next appointment:   3 month(s)  Provider:   Tereso Newcomer, PA-C    Other Instructions

## 2023-04-25 ENCOUNTER — Telehealth (HOSPITAL_COMMUNITY): Payer: Self-pay | Admitting: *Deleted

## 2023-04-25 NOTE — Telephone Encounter (Signed)
Called patient to arrange LAAO consult.  Left message to call back.

## 2023-04-25 NOTE — Telephone Encounter (Signed)
Referral request for watchman consult sent to [facility] coordinator.

## 2023-04-26 ENCOUNTER — Other Ambulatory Visit (HOSPITAL_COMMUNITY): Payer: Medicare Other

## 2023-04-26 ENCOUNTER — Telehealth (HOSPITAL_COMMUNITY): Payer: Self-pay | Admitting: *Deleted

## 2023-04-26 MED ORDER — METOPROLOL SUCCINATE ER 25 MG PO TB24: 25.0000 mg | ORAL_TABLET | Freq: Two times a day (BID) | ORAL | Status: DC

## 2023-04-26 NOTE — Telephone Encounter (Signed)
Spoke with the patient's wife. Scheduled him for [facility] consult with Dr. Lalla Brothers 08/28/2023. She was grateful for call and agreed with plan.

## 2023-04-26 NOTE — Telephone Encounter (Signed)
Patient wife called with update of heart rates/blood pressure since resuming metoprolol.  8/23 am HR was 133 PM 156/81 HR 86 8/24 AM 93/76 HR 124 PM 106/67 HR 110 8/25 AM 137/96  HR141 PM 120/86 HR 108 8/26 AM 153/83 HR 76 8/27 AM 130/70 HR 64 PM 152/77 HR 78 8/28 AM 128/72 HR 130  If when BP/HR is checked and he meets the criteria for cardizem parameters he will take it. g

## 2023-04-26 NOTE — Telephone Encounter (Signed)
Per Jorja Loa PA will increase metoprolol to 25mg  twice a day. Call with update of HR/BP response next week. Pt wife in agreement.

## 2023-05-01 ENCOUNTER — Encounter (HOSPITAL_COMMUNITY): Payer: Medicare Other | Admitting: Physician Assistant

## 2023-05-09 ENCOUNTER — Other Ambulatory Visit (HOSPITAL_COMMUNITY): Payer: Self-pay | Admitting: Physician Assistant

## 2023-05-09 DIAGNOSIS — S065XAA Traumatic subdural hemorrhage with loss of consciousness status unknown, initial encounter: Secondary | ICD-10-CM

## 2023-05-10 DIAGNOSIS — F5105 Insomnia due to other mental disorder: Secondary | ICD-10-CM | POA: Diagnosis not present

## 2023-05-10 DIAGNOSIS — S065X0D Traumatic subdural hemorrhage without loss of consciousness, subsequent encounter: Secondary | ICD-10-CM | POA: Diagnosis not present

## 2023-05-10 DIAGNOSIS — G4700 Insomnia, unspecified: Secondary | ICD-10-CM | POA: Diagnosis not present

## 2023-05-10 DIAGNOSIS — Z79899 Other long term (current) drug therapy: Secondary | ICD-10-CM | POA: Diagnosis not present

## 2023-05-10 DIAGNOSIS — X58XXXD Exposure to other specified factors, subsequent encounter: Secondary | ICD-10-CM | POA: Diagnosis not present

## 2023-05-10 DIAGNOSIS — I480 Paroxysmal atrial fibrillation: Secondary | ICD-10-CM | POA: Diagnosis not present

## 2023-05-10 DIAGNOSIS — F411 Generalized anxiety disorder: Secondary | ICD-10-CM | POA: Diagnosis not present

## 2023-05-12 ENCOUNTER — Telehealth: Payer: Self-pay | Admitting: Physician Assistant

## 2023-05-12 ENCOUNTER — Other Ambulatory Visit: Payer: Self-pay

## 2023-05-12 ENCOUNTER — Inpatient Hospital Stay (HOSPITAL_COMMUNITY)
Admission: EM | Admit: 2023-05-12 | Discharge: 2023-05-14 | DRG: 291 | Disposition: A | Discharge status: Home / Self Care | Payer: Medicare Other | Attending: Internal Medicine | Admitting: Internal Medicine

## 2023-05-12 ENCOUNTER — Emergency Department (HOSPITAL_COMMUNITY): Payer: Medicare Other

## 2023-05-12 ENCOUNTER — Encounter (HOSPITAL_COMMUNITY): Payer: Self-pay

## 2023-05-12 DIAGNOSIS — I700 Atherosclerosis of aorta: Secondary | ICD-10-CM | POA: Diagnosis not present

## 2023-05-12 DIAGNOSIS — I25118 Atherosclerotic heart disease of native coronary artery with other forms of angina pectoris: Secondary | ICD-10-CM

## 2023-05-12 DIAGNOSIS — I509 Heart failure, unspecified: Secondary | ICD-10-CM | POA: Diagnosis not present

## 2023-05-12 DIAGNOSIS — Z951 Presence of aortocoronary bypass graft: Secondary | ICD-10-CM | POA: Diagnosis not present

## 2023-05-12 DIAGNOSIS — Z8249 Family history of ischemic heart disease and other diseases of the circulatory system: Secondary | ICD-10-CM | POA: Diagnosis not present

## 2023-05-12 DIAGNOSIS — I5043 Acute on chronic combined systolic (congestive) and diastolic (congestive) heart failure: Secondary | ICD-10-CM | POA: Diagnosis present

## 2023-05-12 DIAGNOSIS — R609 Edema, unspecified: Secondary | ICD-10-CM | POA: Diagnosis not present

## 2023-05-12 DIAGNOSIS — I671 Cerebral aneurysm, nonruptured: Secondary | ICD-10-CM | POA: Diagnosis present

## 2023-05-12 DIAGNOSIS — E785 Hyperlipidemia, unspecified: Secondary | ICD-10-CM | POA: Diagnosis present

## 2023-05-12 DIAGNOSIS — Z87891 Personal history of nicotine dependence: Secondary | ICD-10-CM

## 2023-05-12 DIAGNOSIS — E8770 Fluid overload, unspecified: Secondary | ICD-10-CM

## 2023-05-12 DIAGNOSIS — E8779 Other fluid overload: Secondary | ICD-10-CM | POA: Diagnosis not present

## 2023-05-12 DIAGNOSIS — Z8679 Personal history of other diseases of the circulatory system: Secondary | ICD-10-CM | POA: Diagnosis not present

## 2023-05-12 DIAGNOSIS — I110 Hypertensive heart disease with heart failure: Secondary | ICD-10-CM | POA: Diagnosis not present

## 2023-05-12 DIAGNOSIS — Z79899 Other long term (current) drug therapy: Secondary | ICD-10-CM | POA: Diagnosis not present

## 2023-05-12 DIAGNOSIS — N3289 Other specified disorders of bladder: Secondary | ICD-10-CM | POA: Diagnosis not present

## 2023-05-12 DIAGNOSIS — R0602 Shortness of breath: Secondary | ICD-10-CM | POA: Diagnosis not present

## 2023-05-12 DIAGNOSIS — I2510 Atherosclerotic heart disease of native coronary artery without angina pectoris: Secondary | ICD-10-CM | POA: Diagnosis present

## 2023-05-12 DIAGNOSIS — I480 Paroxysmal atrial fibrillation: Secondary | ICD-10-CM | POA: Diagnosis not present

## 2023-05-12 DIAGNOSIS — I5023 Acute on chronic systolic (congestive) heart failure: Secondary | ICD-10-CM | POA: Diagnosis not present

## 2023-05-12 DIAGNOSIS — I259 Chronic ischemic heart disease, unspecified: Secondary | ICD-10-CM | POA: Diagnosis not present

## 2023-05-12 DIAGNOSIS — F419 Anxiety disorder, unspecified: Secondary | ICD-10-CM | POA: Diagnosis present

## 2023-05-12 DIAGNOSIS — Z886 Allergy status to analgesic agent status: Secondary | ICD-10-CM

## 2023-05-12 DIAGNOSIS — I5033 Acute on chronic diastolic (congestive) heart failure: Secondary | ICD-10-CM | POA: Diagnosis not present

## 2023-05-12 DIAGNOSIS — Z888 Allergy status to other drugs, medicaments and biological substances status: Secondary | ICD-10-CM | POA: Diagnosis not present

## 2023-05-12 DIAGNOSIS — J9811 Atelectasis: Secondary | ICD-10-CM | POA: Diagnosis present

## 2023-05-12 DIAGNOSIS — J449 Chronic obstructive pulmonary disease, unspecified: Secondary | ICD-10-CM | POA: Diagnosis present

## 2023-05-12 DIAGNOSIS — Z833 Family history of diabetes mellitus: Secondary | ICD-10-CM | POA: Diagnosis not present

## 2023-05-12 DIAGNOSIS — J984 Other disorders of lung: Secondary | ICD-10-CM | POA: Diagnosis not present

## 2023-05-12 DIAGNOSIS — R079 Chest pain, unspecified: Secondary | ICD-10-CM | POA: Diagnosis not present

## 2023-05-12 LAB — BASIC METABOLIC PANEL
Anion gap: 15 (ref 5–15)
BUN: 16 mg/dL (ref 8–23)
CO2: 22 mmol/L (ref 22–32)
Calcium: 8.8 mg/dL — ABNORMAL LOW (ref 8.9–10.3)
Chloride: 104 mmol/L (ref 98–111)
Creatinine, Ser: 1.16 mg/dL (ref 0.61–1.24)
GFR, Estimated: >60 mL/min (ref >60)
Glucose, Bld: 97 mg/dL (ref 70–99)
Potassium: 4.5 mmol/L (ref 3.5–5.1)
Sodium: 141 mmol/L (ref 135–145)

## 2023-05-12 LAB — CREATININE, SERUM
Creatinine, Ser: 1.21 mg/dL (ref 0.61–1.24)
GFR, Estimated: >60 mL/min (ref >60)

## 2023-05-12 LAB — CBC
HCT: 37.4 % — ABNORMAL LOW (ref 39.0–52.0)
HCT: 38.1 % — ABNORMAL LOW (ref 39.0–52.0)
Hemoglobin: 11.3 g/dL — ABNORMAL LOW (ref 13.0–17.0)
Hemoglobin: 11.7 g/dL — ABNORMAL LOW (ref 13.0–17.0)
MCH: 30.1 pg (ref 26.0–34.0)
MCH: 29.9 pg (ref 26.0–34.0)
MCHC: 30.2 g/dL (ref 30.0–36.0)
MCHC: 30.7 g/dL (ref 30.0–36.0)
MCV: 99.5 fL (ref 80.0–100.0)
MCV: 97.4 fL (ref 80.0–100.0)
Platelets: 225 10*3/uL (ref 150–400)
Platelets: 223 10*3/uL (ref 150–400)
RBC: 3.76 MIL/uL — ABNORMAL LOW (ref 4.22–5.81)
RBC: 3.91 MIL/uL — ABNORMAL LOW (ref 4.22–5.81)
RDW: 14.9 % (ref 11.5–15.5)
RDW: 14.8 % (ref 11.5–15.5)
WBC: 8 10*3/uL (ref 4.0–10.5)
WBC: 8.9 10*3/uL (ref 4.0–10.5)
nRBC: 0 % (ref 0.0–0.2)
nRBC: 0 % (ref 0.0–0.2)

## 2023-05-12 LAB — TROPONIN I (HIGH SENSITIVITY)
Troponin I (High Sensitivity): 17 ng/L (ref <18)
Troponin I (High Sensitivity): 17 ng/L (ref <18)

## 2023-05-12 LAB — BRAIN NATRIURETIC PEPTIDE: B Natriuretic Peptide: 713.9 pg/mL — ABNORMAL HIGH (ref 0.0–100.0)

## 2023-05-12 MED ORDER — POTASSIUM CHLORIDE ER 10 MEQ PO TBCR
10.0000 meq | EXTENDED_RELEASE_TABLET | Freq: Every day | ORAL | Status: DC
  Administered 2023-05-12: 10 meq via ORAL
  Filled 2023-05-12 (×2): qty 1

## 2023-05-12 MED ORDER — METOPROLOL SUCCINATE ER 25 MG PO TB24
25.0000 mg | ORAL_TABLET | Freq: Two times a day (BID) | ORAL | Status: DC
  Administered 2023-05-12 – 2023-05-14 (×4): 25 mg via ORAL
  Filled 2023-05-12 (×4): qty 1

## 2023-05-12 MED ORDER — ACETAMINOPHEN 325 MG PO TABS: 650.0000 mg | ORAL_TABLET | Freq: Four times a day (QID) | ORAL | Status: DC | PRN

## 2023-05-12 MED ORDER — LABETALOL HCL 5 MG/ML IV SOLN: 20.0000 mg | INTRAVENOUS | Status: DC | PRN

## 2023-05-12 MED ORDER — LISINOPRIL 20 MG PO TABS
20.0000 mg | ORAL_TABLET | Freq: Every day | ORAL | Status: DC
  Administered 2023-05-13: 20 mg via ORAL
  Filled 2023-05-12: qty 1

## 2023-05-12 MED ORDER — FUROSEMIDE 10 MG/ML IJ SOLN
40.0000 mg | Freq: Once | INTRAMUSCULAR | Status: AC
  Administered 2023-05-12: 40 mg via INTRAVENOUS
  Filled 2023-05-12: qty 4

## 2023-05-12 MED ORDER — ACETAMINOPHEN 650 MG RE SUPP: 650.0000 mg | Freq: Four times a day (QID) | RECTAL | Status: DC | PRN

## 2023-05-12 MED ORDER — ATORVASTATIN CALCIUM 40 MG PO TABS
40.0000 mg | ORAL_TABLET | Freq: Every day | ORAL | Status: DC
  Administered 2023-05-12 – 2023-05-13 (×2): 40 mg via ORAL
  Filled 2023-05-12 (×2): qty 1

## 2023-05-12 MED ORDER — DOFETILIDE 250 MCG PO CAPS
250.0000 ug | ORAL_CAPSULE | Freq: Two times a day (BID) | ORAL | Status: DC
  Administered 2023-05-12 – 2023-05-14 (×4): 250 ug via ORAL
  Filled 2023-05-12 (×6): qty 1

## 2023-05-12 MED ORDER — HYDRALAZINE HCL 20 MG/ML IJ SOLN: 10.0000 mg | INTRAMUSCULAR | Status: DC | PRN

## 2023-05-12 MED ORDER — ALPRAZOLAM 0.25 MG PO TABS
0.2500 mg | ORAL_TABLET | Freq: Every day | ORAL | Status: DC
  Administered 2023-05-12 – 2023-05-13 (×2): 0.25 mg via ORAL
  Filled 2023-05-12 (×2): qty 1

## 2023-05-12 MED ORDER — ENOXAPARIN SODIUM 40 MG/0.4ML IJ SOSY
40.0000 mg | PREFILLED_SYRINGE | INTRAMUSCULAR | Status: DC
  Administered 2023-05-13 – 2023-05-14 (×2): 40 mg via SUBCUTANEOUS
  Filled 2023-05-12 (×2): qty 0.4

## 2023-05-12 MED ORDER — POLYETHYLENE GLYCOL 3350 17 G PO PACK: 17.0000 g | PACK | Freq: Every day | ORAL | Status: DC | PRN

## 2023-05-12 MED ORDER — SODIUM CHLORIDE 0.9% FLUSH
3.0000 mL | Freq: Two times a day (BID) | INTRAVENOUS | Status: DC
  Administered 2023-05-12 – 2023-05-13 (×2): 3 mL via INTRAVENOUS

## 2023-05-12 MED ORDER — FUROSEMIDE 40 MG PO TABS
40.0000 mg | ORAL_TABLET | Freq: Every day | ORAL | Status: DC
  Administered 2023-05-13: 40 mg via ORAL
  Filled 2023-05-12: qty 1

## 2023-05-12 NOTE — ED Triage Notes (Signed)
Pt arrives via POV. Pt reports intermittent chest pain and sob when laying flat for the past week or so.

## 2023-05-12 NOTE — ED Provider Notes (Signed)
[place] EMERGENCY DEPARTMENT AT [facility] Provider Note   CSN: [id number] Arrival date & time: 05/12/23  1536     History  Chief Complaint  Patient presents with   Shortness of Breath   Chest Pain    [name] is a 77 y.o. male history of CAD, COPD, hypertension, AAA, A-fib on diltiazem, subdural hematoma presented for shortness of breath that began last night.  Wife is present to assist in history.  Both state that patient became short of breath when lying flat in the recliner and wife notices that his legs do appear more swollen than normal.  Patient states that when he is upright he feels fine however when lying down he becomes short of breath with mild chest discomfort.  Patient is not currently on any Lasix and is unaware of any heart failure diagnosis.  Last echo: 12/29/2015 LVEF 45 to 50%, mild mitral valve regurgitation, severely dilated left atrium  Home Medications Prior to Admission medications   Medication Sig Start Date End Date Taking? Authorizing Provider  acetaminophen (TYLENOL) 500 MG tablet Take 1 tablet (500 mg total) by mouth every 6 (six) hours for 8 days then as directed by MD 04/04/23  Yes Dahbura, Isidoro Donning, DO  ALPRAZolam (XANAX) 0.25 MG tablet TAKE 1 TABLET(0.25 MG) BY MOUTH DAILY Patient taking differently: Take 0.25 mg by mouth at bedtime. 11/12/20  Yes Ladona Ridgel, Malena M, DO  ascorbic acid (VITAMIN C) 500 MG tablet Take 500 mg by mouth at bedtime.   Yes [provider]  atorvastatin (LIPITOR) 40 MG tablet TAKE 1 TABLET(40 MG) BY MOUTH DAILY Patient taking differently: Take 40 mg by mouth at bedtime. 06/03/20  Yes Ladona Ridgel, Malena M, DO  diltiazem (CARDIZEM) 30 MG tablet Take 1 tablet every 4 hours AS NEEDED for heart rate >100 as long as blood pressure >100. 10/25/22  Yes Newman Nip, NP  dofetilide (TIKOSYN) 250 MCG capsule Take 1 capsule (250 mcg total) by mouth 2 (two) times daily. 04/19/23  Yes Fenton, Clint R, PA  lisinopril (ZESTRIL)  20 MG tablet TAKE 1 TABLET(20 MG) BY MOUTH DAILY 06/03/20  Yes Ladona Ridgel, Malena M, DO  loperamide (IMODIUM A-D) 2 MG tablet Take 2 mg by mouth 4 (four) times daily as needed for diarrhea or loose stools.   Yes [provider]  metoprolol succinate (TOPROL XL) 25 MG 24 hr tablet Take 1 tablet (25 mg total) by mouth 2 (two) times daily. 04/26/23 04/25/24 Yes Fenton, Clint R, PA  Multiple Vitamins-Minerals (CENTRUM SILVER PO) Take 1 tablet by mouth at bedtime.   Yes [provider]  nitroGLYCERIN (NITROSTAT) 0.4 MG SL tablet Place 1 tablet (0.4 mg total) under the tongue every 5 (five) minutes as needed for chest pain. 02/09/23  Yes Jake Bathe, MD  Omega-3 Fatty Acids (FISH OIL) 1000 MG CAPS Take 1,000 mg by mouth at bedtime.   Yes [provider]  potassium chloride (KLOR-CON) 10 MEQ tablet TAKE 1 TABLET(10 MEQ) BY MOUTH DAILY Patient taking differently: Take 10 mEq by mouth at bedtime. 11/22/22  Yes Fenton, Clint R, PA      Allergies    Vibramycin [doxycycline], Asa [aspirin], and Nsaids    Review of Systems   Review of Systems  Respiratory:  Positive for shortness of breath.   Cardiovascular:  Positive for chest pain.    Physical Exam Updated Vital Signs BP (!) 196/93 (BP Location: Right Arm)   Pulse 68   Temp 97.9 F (  36.6 C) (Oral)   Resp 18   Ht 5\' 5"  (1.651 m)   Wt 62.4 kg   SpO2 91%   BMI 22.90 kg/m  Physical Exam Constitutional:      General: He is not in acute distress. Cardiovascular:     Rate and Rhythm: Normal rate and regular rhythm.     Pulses: Normal pulses.     Heart sounds: Normal heart sounds.  Pulmonary:     Effort: Pulmonary effort is normal. No respiratory distress.     Breath sounds: Normal breath sounds.  Abdominal:     Palpations: Abdomen is soft.     Tenderness: There is no abdominal tenderness. There is no guarding or rebound.  Musculoskeletal:     Comments: 1+ bilateral pitting edema  Skin:    General: Skin is warm and  dry.     Capillary Refill: Capillary refill takes less than 2 seconds.     Findings: No erythema.  Neurological:     Mental Status: He is alert.  Psychiatric:        Mood and Affect: Mood normal.     ED Results / Procedures / Treatments   Labs (all labs ordered are listed, but only abnormal results are displayed) Labs Reviewed  BASIC METABOLIC PANEL - Abnormal; Notable for the following components:      Result Value   Calcium 8.8 (*)    All other components within normal limits  CBC - Abnormal; Notable for the following components:   RBC 3.76 (*)    Hemoglobin 11.3 (*)    HCT 37.4 (*)    All other components within normal limits  BRAIN NATRIURETIC PEPTIDE - Abnormal; Notable for the following components:   B Natriuretic Peptide 713.9 (*)    All other components within normal limits  PROTEIN / CREATININE RATIO, URINE  HEPATIC FUNCTION PANEL  CBC  CREATININE, SERUM  APTT  PROTIME-INR  BASIC METABOLIC PANEL  CBC  TROPONIN I (HIGH SENSITIVITY)  TROPONIN I (HIGH SENSITIVITY)    EKG EKG Interpretation Date/Time:  Saturday May 12 2023 15:49:37 EDT Ventricular Rate:  56 PR Interval:    QRS Duration:  82 QT Interval:  452 QTC Calculation: 436 R Axis:   28  Text Interpretation: Sinus rhythm Septal infarct , age undetermined No significant change since last tracing When compared with ECG of 24-Apr-2023 14:09, PREVIOUS ECG IS PRESENT Confirmed by Gwyneth Sprout ([id number]) on 05/12/2023 4:30:03 PM  Radiology DG Chest 2 View  Result Date: 05/12/2023 CLINICAL DATA:  Shortness of breath for the past week. EXAM: CHEST - 2 VIEW COMPARISON:  Chest radiograph dated 04/02/2023. FINDINGS: The heart size and mediastinal contours are within normal limits. Vascular calcifications are seen in the aortic arch. Mild bibasilar atelectasis/airspace disease. No pleural effusion or pneumothorax. Degenerative changes are seen in the spine. IMPRESSION: Mild bibasilar atelectasis/airspace  disease. Electronically Signed   By: Romona Curls M.D.   On: 05/12/2023 17:09    Procedures Procedures    Medications Ordered in ED Medications  labetalol (NORMODYNE) injection 20 mg (has no administration in time range)  hydrALAZINE (APRESOLINE) injection 10 mg (has no administration in time range)  furosemide (LASIX) tablet 40 mg (has no administration in time range)  ALPRAZolam (XANAX) tablet 0.25 mg (0.25 mg Oral Given 05/12/23 2047)  dofetilide (TIKOSYN) capsule 250 mcg (250 mcg Oral Given 05/12/23 2046)  atorvastatin (LIPITOR) tablet 40 mg (40 mg Oral Given 05/12/23 2047)  lisinopril (ZESTRIL) tablet 20 mg (has no administration  in time range)  metoprolol succinate (TOPROL-XL) 24 hr tablet 25 mg (25 mg Oral Given 05/12/23 2047)  potassium chloride (KLOR-CON) CR tablet 10 mEq (10 mEq Oral Given 05/12/23 2047)  enoxaparin (LOVENOX) injection 40 mg (has no administration in time range)  acetaminophen (TYLENOL) tablet 650 mg (has no administration in time range)    Or  acetaminophen (TYLENOL) suppository 650 mg (has no administration in time range)  polyethylene glycol (MIRALAX / GLYCOLAX) packet 17 g (has no administration in time range)  sodium chloride flush (NS) 0.9 % injection 3 mL (3 mLs Intravenous Given 05/12/23 2047)  furosemide (LASIX) injection 40 mg (40 mg Intravenous Given 05/12/23 2046)    ED Course/ Medical Decision Making/ A&P                                 Medical Decision Making Amount and/or Complexity of Data Reviewed Labs: ordered. Radiology: ordered.  Risk Decision regarding hospitalization.   [name] 77 y.o. presented today for chest pain. Working DDx that I considered at this time includes, but not limited to, ACS, GERD, pulmonary embolism, community-acquired pneumonia, aortic dissection, pneumothorax, underlying bony abnormality, anemia, thyrotoxicosis, esophageal rupture, CHF exacerbation, valvular disorder, myocarditis, pericarditis, endocarditis,  pericardial effusion/cardiac tamponade, pulmonary edema, gastritis/PUD, esophagitis.  R/o Dx: ACS, GERD, pulmonary embolism, community-acquired pneumonia, aortic dissection, pneumothorax, underlying bony abnormality, anemia, thyrotoxicosis, esophageal rupture, valvular disorder, myocarditis, pericarditis, endocarditis, pericardial effusion/cardiac tamponade, pulmonary edema, gastritis/PUD, esophagitis: These are considered less likely due to history of present illness and physical exam findings. Aortic Dissection: less likely based on the location, quality, onset, and severity of symptoms in this case. Patient also has a lack of underlying history of AD or TAA.   Review of prior external notes: 04/17/2023 Discharge Summary  Unique Tests and My Interpretation:  EKG: Rate, rhythm, axis, intervals all examined and without medically relevant abnormality. ST segments without concerns for elevations Troponin: 17 CXR: Unremarkable CBC: Unremarkable BMP: Unremarkable BNP: 713.9  Discussion with Independent Historian:  Wife  Discussion of Management of Tests:  Goel, MD Hospitalist  Risk: High: hospitalization or escalation of hospital-level care  Risk Stratification Score:  Staffed with Plunkett, MD  Plan: On exam patient was in no acute distress but was noted to be hypertensive at 157/102. Patient's physical was does show bilateral 1+ pitting edema but otherwise lungs and chest were clear to auscultation. Labs and CXR will be ordered.  Suspect CHF exacerbation due to orthopnea and mild fluid overload patient stable at this time.  BNP came back elevated suggesting CHF exacerbation.  Patient not currently on any Lasix has not had an echo in some years.  The attending and I agree that patient will need to be admitted for IV diuresis along with echo and Lasix prescription afterwards.  I spoke to the patient about this plan and he is in agreements with admission.  Hospitalist consulted.  Patient  stable for admission at this time.  Hospitalist consulted and accepted patient for admission.  Patient stable for admission at this time.         Final Clinical Impression(s) / ED Diagnoses Final diagnoses:  Congestive heart failure, unspecified HF chronicity, unspecified heart failure type [facility])    Rx / DC Orders ED Discharge Orders     None         Remi Deter 05/12/23 2114    Gwyneth Sprout, MD 05/12/23 2321

## 2023-05-12 NOTE — Assessment & Plan Note (Signed)
Patient has bilateral ankle edema, pleural effusion appearance on chest x-ray (CPA obscuration bialterally) as well as jugular venous distention.  Patient has had orthopnea for the last 48 hours.  BNP is elevated.  I suspect patient is having congestive heart failure.  We will get an echo.  Start the patient on Lasix.  I will also check LFTs in the morning and urine protein and creatinine ratio.  Patient's troponin is within normal limits.  Patient is complaining of on and off chest pain for the last several days without relation to exertion, given his recent endovascular procedure, as well as radiation of the pain to the back, I will go ahead and get CT angiogram to rule out any dissection.  For his leg edema I will also get lower extremity Dopplers.  Monitor intake output and daily weights discussed with wife and patient in detail

## 2023-05-12 NOTE — H&P (Signed)
History and Physical    Patient: [name]:[id number] DOB: 1945-12-18 DOA: 05/12/2023 DOS: the patient was seen and examined on 05/12/2023 PCP: Benita Stabile, MD  Patient coming from: Home  Chief Complaint:  Chief Complaint  Patient presents with   Shortness of Breath   Chest Pain   HPI: JOHNMATTHEW PRESTO is a 77 y.o. male with medical history significant of recent attempted middle meningeal artery embolization that was unsuccessful.  The indication was cerebral aneurysm.  Discharged on April 17, 2023.  Patient has since then returned to work.  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  Walking around in general he typically has no chest pain or shortness of breath with that.  However for the last 1 week or so patient has noticed leg swelling bilateral ankles when he returns from work.  It subsides by leg elevation by the following morning.  He has had about 2 days of orthopnea/trouble breathing when he lays flat at night to sleep.  He is also noticed on and off left-sided chest discomfort radiating to the left infrascapular area.  It is slight achy normal tearing.  Not precipitated by exertion.  There is no fever no coughing no other shortness of breath except as defined described above.  No nausea vomiting or diarrhea.  Patient came to the ER today because of new worsening shortness of breath related to sleeping. Review of Systems: As mentioned in the history of present illness. All other systems reviewed and are negative. Past Medical History:  Diagnosis Date   AAA (abdominal aortic aneurysm) (HCC)    06/27/20 MRI: 3.0 cm infrarenal AAA, recommend 3 year follow-up   Anxiety    Atrial fibrillation (HCC)    Bilateral carotid artery disease (HCC) 07/21/2013   CAD (coronary artery disease) 2008   CABG   COPD (chronic obstructive pulmonary disease) (HCC)    Dysrhythmia    A-fib   Gastritis    Headache(784.0)    History of hiatal hernia    Hypertension    SDH  (subdural hematoma) (HCC) 01/31/2023   Visit for monitoring Tikosyn therapy 12/2015   Past Surgical History:  Procedure Laterality Date   BURR HOLE Left 03/27/2023   Procedure: BURR HOLE EVASCUATION OF SUBDURAL HEMATOMA;  Surgeon: Jadene Pierini, MD;  Location: [facility] OR;  Service: Neurosurgery;  Laterality: Left;   CARDIOVERSION N/A 09/08/2015   Procedure: CARDIOVERSION;  Surgeon: Vesta Mixer, MD;  Location: [facility] ENDOSCOPY;  Service: Cardiovascular;  Laterality: N/A;   CORONARY ARTERY BYPASS GRAFT     EXCISION OF KELOID Left 10/08/2018   Procedure: EXCISION OF CHRONIC ABDOMINAL WALL WOUND;  Surgeon: Abigail Miyamoto, MD;  Location: [facility];  Service: General;  Laterality: Left;   INCISION AND DRAINAGE DEEP NECK ABSCESS     IR ANGIO EXTERNAL CAROTID SEL EXT CAROTID UNI L MOD SED  04/17/2023   IR ANGIO INTRA EXTRACRAN SEL COM CAROTID INNOMINATE UNI L MOD SED  04/17/2023   IR NEURO EACH ADD'L AFTER BASIC UNI LEFT (MS)  04/17/2023   LUMBAR LAMINECTOMY/DECOMPRESSION MICRODISCECTOMY Right 08/09/2020   Procedure: Laminectomy and Foraminotomy - Lumbar three-Lumbar four - Lumbar four-Lumbar five- right;  Surgeon: Donalee Citrin, MD;  Location: [facility] OR;  Service: Neurosurgery;  Laterality: Right;   RADIOLOGY WITH ANESTHESIA N/A 04/17/2023   Procedure: Left MMA;  Surgeon: Lisbeth Renshaw, MD;  Location: [facility] OR;  Service: Radiology;  Laterality: N/A;   ROTATOR CUFF REPAIR Bilateral  TEE WITHOUT CARDIOVERSION N/A 08/02/2015   Procedure: TRANSESOPHAGEAL ECHOCARDIOGRAM (TEE);  Surgeon: Quintella Reichert, MD;  Location: [facility] ENDOSCOPY;  Service: Cardiovascular;  Laterality: N/A;   TEE WITHOUT CARDIOVERSION N/A 09/08/2015   Procedure: TRANSESOPHAGEAL ECHOCARDIOGRAM (TEE);  Surgeon: Vesta Mixer, MD;  Location: [facility] ENDOSCOPY;  Service: Cardiovascular;  Laterality: N/A;   Social History:  reports that he quit smoking about 64 years ago. His smoking use included cigarettes. He quit smokeless tobacco  use about 8 months ago.  His smokeless tobacco use included chew. He reports that he does not drink alcohol and does not use drugs.  Allergies  Allergen Reactions   Vibramycin [Doxycycline] Hives and Shortness Of Breath    Increased urinary frequency   Asa [Aspirin] Hypertension    Told to avoid due to heart condition   Nsaids Hypertension    Told to avoid due to heart condition    Family History  Problem Relation Age of Onset   Diabetes Mother    Hypertension Mother    Heart attack Father    Sudden death Father    Hypertension Sister    Diabetes Sister     Prior to Admission medications   Medication Sig Start Date End Date Taking? Authorizing Provider  acetaminophen (TYLENOL) 500 MG tablet Take 1 tablet (500 mg total) by mouth every 6 (six) hours for 8 days then as directed by MD 04/04/23  Yes Dahbura, Isidoro Donning, DO  ALPRAZolam (XANAX) 0.25 MG tablet TAKE 1 TABLET(0.25 MG) BY MOUTH DAILY Patient taking differently: Take 0.25 mg by mouth at bedtime. 11/12/20  Yes Ladona Ridgel, Malena M, DO  ascorbic acid (VITAMIN C) 500 MG tablet Take 500 mg by mouth at bedtime.   Yes [provider]  atorvastatin (LIPITOR) 40 MG tablet TAKE 1 TABLET(40 MG) BY MOUTH DAILY Patient taking differently: Take 40 mg by mouth at bedtime. 06/03/20  Yes Ladona Ridgel, Malena M, DO  diltiazem (CARDIZEM) 30 MG tablet Take 1 tablet every 4 hours AS NEEDED for heart rate >100 as long as blood pressure >100. 10/25/22  Yes Newman Nip, NP  dofetilide (TIKOSYN) 250 MCG capsule Take 1 capsule (250 mcg total) by mouth 2 (two) times daily. 04/19/23  Yes Fenton, Clint R, PA  lisinopril (ZESTRIL) 20 MG tablet TAKE 1 TABLET(20 MG) BY MOUTH DAILY 06/03/20  Yes Ladona Ridgel, Malena M, DO  loperamide (IMODIUM A-D) 2 MG tablet Take 2 mg by mouth 4 (four) times daily as needed for diarrhea or loose stools.   Yes [provider]  metoprolol succinate (TOPROL XL) 25 MG 24 hr tablet Take 1 tablet (25 mg total) by mouth 2 (two) times  daily. 04/26/23 04/25/24 Yes Fenton, Clint R, PA  Multiple Vitamins-Minerals (CENTRUM SILVER PO) Take 1 tablet by mouth at bedtime.   Yes [provider]  nitroGLYCERIN (NITROSTAT) 0.4 MG SL tablet Place 1 tablet (0.4 mg total) under the tongue every 5 (five) minutes as needed for chest pain. 02/09/23  Yes Jake Bathe, MD  Omega-3 Fatty Acids (FISH OIL) 1000 MG CAPS Take 1,000 mg by mouth at bedtime.   Yes [provider]  potassium chloride (KLOR-CON) 10 MEQ tablet TAKE 1 TABLET(10 MEQ) BY MOUTH DAILY Patient taking differently: Take 10 mEq by mouth at bedtime. 11/22/22  Yes FentonLevonne Spiller R, [place]    Physical Exam: Vitals:   05/12/23 1752 05/12/23 1753 05/12/23 1800 05/12/23 1811  BP: (!) 173/100  (!) 157/102   Pulse: (!) 52 64 (!) 50 66  Resp: 16 14 12 20   Temp:      SpO2: 95% 95% 92% 96%  Weight:      Height:       General: Patient is alert awake, no immediate distress.  Wife at the bedside Respiratory exam: Bilateral air entry vesicular. Diminished breath sounds bialteral bases posteriorly  Cardiovascular exam S1-S2 normal Abdomen all quadrants are soft nontender Extremities warm trace edema on bilateral ankles distal function intact. Nuclear venous distention is noticed just above clavicle on the right side after patient is late to 45 degrees angle. Data Reviewed:  Labs on Admission:  Results for orders placed or performed during the hospital encounter of 05/12/23 (from the past 24 hour(s))  Basic metabolic panel     Status: Abnormal   Collection Time: 05/12/23  4:12 PM  Result Value Ref Range   Sodium 141 135 - 145 mmol/L   Potassium 4.5 3.5 - 5.1 mmol/L   Chloride 104 98 - 111 mmol/L   CO2 22 22 - 32 mmol/L   Glucose, Bld 97 70 - 99 mg/dL   BUN 16 8 - 23 mg/dL   Creatinine, Ser 4.09 0.61 - 1.24 mg/dL   Calcium 8.8 (L) 8.9 - 10.3 mg/dL   GFR, Estimated >81 >19 mL/min   Anion gap 15 5 - 15  CBC     Status: Abnormal   Collection Time: 05/12/23  4:12 PM   Result Value Ref Range   WBC 8.0 4.0 - 10.5 K/uL   RBC 3.76 (L) 4.22 - 5.81 MIL/uL   Hemoglobin 11.3 (L) 13.0 - 17.0 g/dL   HCT 14.7 (L) 82.9 - 56.2 %   MCV 99.5 80.0 - 100.0 fL   MCH 30.1 26.0 - 34.0 pg   MCHC 30.2 30.0 - 36.0 g/dL   RDW 13.0 86.5 - 78.4 %   Platelets 225 150 - 400 K/uL   nRBC 0.0 0.0 - 0.2 %  Troponin I (High Sensitivity)     Status: None   Collection Time: 05/12/23  4:12 PM  Result Value Ref Range   Troponin I (High Sensitivity) 17 <18 ng/L  Brain natriuretic peptide     Status: Abnormal   Collection Time: 05/12/23  5:00 PM  Result Value Ref Range   B Natriuretic Peptide 713.9 (H) 0.0 - 100.0 pg/mL   Basic Metabolic Panel: Recent Labs  Lab 05/12/23 1612  NA 141  K 4.5  CL 104  CO2 22  GLUCOSE 97  BUN 16  CREATININE 1.16  CALCIUM 8.8*   Liver Function Tests: No results for input(s): "AST", "ALT", "ALKPHOS", "BILITOT", "PROT", "ALBUMIN" in the last 168 hours. No results for input(s): "LIPASE", "AMYLASE" in the last 168 hours. No results for input(s): "AMMONIA" in the last 168 hours. CBC: Recent Labs  Lab 05/12/23 1612  WBC 8.0  HGB 11.3*  HCT 37.4*  MCV 99.5  PLT 225   Cardiac Enzymes: Recent Labs  Lab 05/12/23 1612  TROPONINIHS 17    BNP (last 3 results) No results for input(s): "PROBNP" in the last 8760 hours. CBG: No results for input(s): "GLUCAP" in the last 168 hours.  Radiological Exams on Admission:  DG Chest 2 View  Result Date: 05/12/2023 CLINICAL DATA:  Shortness of breath for the past week. EXAM: CHEST - 2 VIEW COMPARISON:  Chest radiograph dated 04/02/2023. FINDINGS: The heart size and mediastinal contours are within normal limits. Vascular calcifications are seen in the aortic arch. Mild bibasilar atelectasis/airspace disease. No pleural effusion or pneumothorax.  Degenerative changes are seen in the spine. IMPRESSION: Mild bibasilar atelectasis/airspace disease. Electronically Signed   By: Romona Curls M.D.   On:  05/12/2023 17:09    EKG: Independently reviewed. NSR with Pac and PVC   Assessment and Plan: Fluid overload Patient has bilateral ankle edema, pleural effusion appearance on chest x-ray (CPA obscuration bialterally) as well as jugular venous distention.  Patient has had orthopnea for the last 48 hours.  BNP is elevated.  I suspect patient is having congestive heart failure.  We will get an echo.  Start the patient on Lasix.  I will also check LFTs in the morning and urine protein and creatinine ratio.  Patient's troponin is within normal limits.  Patient is complaining of on and off chest pain for the last several days without relation to exertion, given his recent endovascular procedure, as well as radiation of the pain to the back, I will go ahead and get CT angiogram to rule out any dissection.  For his leg edema I will also get lower extremity Dopplers.  Monitor intake output and daily weights discussed with wife and patient in detail      Advance Care Planning:   Code Status: Prior full code  Consults: pending echo.  Family Communication: discussed with patient and wife , all questions answered.  Severity of Illness: The appropriate patient status for this patient is INPATIENT. Inpatient status is judged to be reasonable and necessary in order to provide the required intensity of service to ensure the patient's safety. The patient's presenting symptoms, physical exam findings, and initial radiographic and laboratory data in the context of their chronic comorbidities is felt to place them at high risk for further clinical deterioration. Furthermore, it is not anticipated that the patient will be medically stable for discharge from the hospital within 2 midnights of admission.   * I certify that at the point of admission it is my clinical judgment that the patient will require inpatient hospital care spanning beyond 2 midnights from the point of admission due to high intensity of service,  high risk for further deterioration and high frequency of surveillance required.*  Author: Nolberto Hanlon, MD 05/12/2023 7:35 PM  For on call review [email or web address].

## 2023-05-12 NOTE — ED Notes (Signed)
ED TO INPATIENT HANDOFF REPORT  ED Nurse Name and Phone #: Darnette Lampron, RN [phone]  S Name/Age/Gender [name] 77 y.o. male Room/Bed: 033C/033C  Code Status   Code Status: Full Code  Home/SNF/Other Home Patient oriented to: self, place, time, and situation Is this baseline? Yes   Triage Complete: Triage complete  Chief Complaint CHF (congestive heart failure) (HCC) [I50.9]  Triage Note Pt arrives via POV. Pt reports intermittent chest pain and sob when laying flat for the past week or so.    Allergies Allergies  Allergen Reactions   Vibramycin [Doxycycline] Hives and Shortness Of Breath    Increased urinary frequency   Asa [Aspirin] Hypertension    Told to avoid due to heart condition   Nsaids Hypertension    Told to avoid due to heart condition    Level of Care/Admitting Diagnosis ED Disposition     ED Disposition  Admit   Condition  --   Comment  Hospital Area: [facility] [[id number]]  Level of Care: Telemetry Medical [104]  May admit patient to [facility] or [facility] if equivalent level of care is available:: No  Covid Evaluation: Asymptomatic - no recent exposure (last 10 days) testing not required  Diagnosis: CHF (congestive heart failure) [facility]) [[id number]]  Admitting Physician: Nolberto Hanlon [[id number]]  Attending Physician: Nolberto Hanlon [[id number]]  Certification:: I certify this patient will need inpatient services for at least 2 midnights  Expected Medical Readiness: 05/16/2023          B Medical/Surgery History Past Medical History:  Diagnosis Date   AAA (abdominal aortic aneurysm) (HCC)    06/27/20 MRI: 3.0 cm infrarenal AAA, recommend 3 year follow-up   Anxiety    Atrial fibrillation (HCC)    Bilateral carotid artery disease (HCC) 07/21/2013   CAD (coronary artery disease) 2008   CABG   COPD (chronic obstructive pulmonary disease) (HCC)    Dysrhythmia    A-fib   Gastritis    Headache(784.0)    History of hiatal hernia     Hypertension    SDH (subdural hematoma) (HCC) 01/31/2023   Visit for monitoring Tikosyn therapy 12/2015   Past Surgical History:  Procedure Laterality Date   BURR HOLE Left 03/27/2023   Procedure: BURR HOLE EVASCUATION OF SUBDURAL HEMATOMA;  Surgeon: Jadene Pierini, MD;  Location: [facility] OR;  Service: Neurosurgery;  Laterality: Left;   CARDIOVERSION N/A 09/08/2015   Procedure: CARDIOVERSION;  Surgeon: Vesta Mixer, MD;  Location: [facility] ENDOSCOPY;  Service: Cardiovascular;  Laterality: N/A;   CORONARY ARTERY BYPASS GRAFT     EXCISION OF KELOID Left 10/08/2018   Procedure: EXCISION OF CHRONIC ABDOMINAL WALL WOUND;  Surgeon: Abigail Miyamoto, MD;  Location: [facility];  Service: General;  Laterality: Left;   INCISION AND DRAINAGE DEEP NECK ABSCESS     IR ANGIO EXTERNAL CAROTID SEL EXT CAROTID UNI L MOD SED  04/17/2023   IR ANGIO INTRA EXTRACRAN SEL COM CAROTID INNOMINATE UNI L MOD SED  04/17/2023   IR NEURO EACH ADD'L AFTER BASIC UNI LEFT (MS)  04/17/2023   LUMBAR LAMINECTOMY/DECOMPRESSION MICRODISCECTOMY Right 08/09/2020   Procedure: Laminectomy and Foraminotomy - Lumbar three-Lumbar four - Lumbar four-Lumbar five- right;  Surgeon: Donalee Citrin, MD;  Location: [facility] OR;  Service: Neurosurgery;  Laterality: Right;   RADIOLOGY WITH ANESTHESIA N/A 04/17/2023   Procedure: Left MMA;  Surgeon: Lisbeth Renshaw, MD;  Location: [facility] OR;  Service: Radiology;  Laterality: N/A;   ROTATOR CUFF REPAIR Bilateral  TEE WITHOUT CARDIOVERSION N/A 08/02/2015   Procedure: TRANSESOPHAGEAL ECHOCARDIOGRAM (TEE);  Surgeon: Quintella Reichert, MD;  Location: [facility] ENDOSCOPY;  Service: Cardiovascular;  Laterality: N/A;   TEE WITHOUT CARDIOVERSION N/A 09/08/2015   Procedure: TRANSESOPHAGEAL ECHOCARDIOGRAM (TEE);  Surgeon: Vesta Mixer, MD;  Location: [facility] ENDOSCOPY;  Service: Cardiovascular;  Laterality: N/A;     A IV Location/Drains/Wounds Patient Lines/Drains/Airways Status     Active Line/Drains/Airways      None            Intake/Output Last 24 hours No intake or output data in the 24 hours ending 05/12/23 2011  Labs/Imaging Results for orders placed or performed during the hospital encounter of 05/12/23 (from the past 48 hour(s))  Basic metabolic panel     Status: Abnormal   Collection Time: 05/12/23  4:12 PM  Result Value Ref Range   Sodium 141 135 - 145 mmol/L   Potassium 4.5 3.5 - 5.1 mmol/L    Comment: HEMOLYSIS AT THIS LEVEL MAY AFFECT RESULT   Chloride 104 98 - 111 mmol/L   CO2 22 22 - 32 mmol/L   Glucose, Bld 97 70 - 99 mg/dL    Comment: Glucose reference range applies only to samples taken after fasting for at least 8 hours.   BUN 16 8 - 23 mg/dL   Creatinine, Ser 1.61 0.61 - 1.24 mg/dL   Calcium 8.8 (L) 8.9 - 10.3 mg/dL   GFR, Estimated >09 >60 mL/min    Comment: (NOTE) Calculated using the CKD-EPI Creatinine Equation (2021)    Anion gap 15 5 - 15    Comment: Performed at [facility], [address], [place], [place] [address]  CBC     Status: Abnormal   Collection Time: 05/12/23  4:12 PM  Result Value Ref Range   WBC 8.0 4.0 - 10.5 K/uL   RBC 3.76 (L) 4.22 - 5.81 MIL/uL   Hemoglobin 11.3 (L) 13.0 - 17.0 g/dL   HCT 81.1 (L) 91.4 - 78.2 %   MCV 99.5 80.0 - 100.0 fL   MCH 30.1 26.0 - 34.0 pg   MCHC 30.2 30.0 - 36.0 g/dL   RDW 95.6 21.3 - 08.6 %   Platelets 225 150 - 400 K/uL   nRBC 0.0 0.0 - 0.2 %    Comment: Performed at [facility], [address], [place], [place] [address]  Troponin I (High Sensitivity)     Status: None   Collection Time: 05/12/23  4:12 PM  Result Value Ref Range   Troponin I (High Sensitivity) 17 <18 ng/L    Comment: (NOTE) Elevated high sensitivity troponin I (hsTnI) values and significant  changes across serial measurements may suggest ACS but many other  chronic and acute conditions are known to elevate hsTnI results.  Refer to the "Links" section for chest pain algorithms and additional  guidance. Performed at [facility], [address], [place], [place] [address]   Brain natriuretic peptide     Status: Abnormal   Collection Time: 05/12/23  5:00 PM  Result Value Ref Range   B Natriuretic Peptide 713.9 (H) 0.0 - 100.0 pg/mL    Comment: Performed at [facility], [address] [place], [place] [address]   DG Chest 2 View  Result Date: 05/12/2023 CLINICAL DATA:  Shortness of breath for the past week. EXAM: CHEST - 2 VIEW COMPARISON:  Chest radiograph dated 04/02/2023. FINDINGS: The heart size and mediastinal contours are within normal  limits. Vascular calcifications are seen in the aortic arch. Mild bibasilar atelectasis/airspace disease. No pleural effusion or pneumothorax. Degenerative changes are seen in the spine. IMPRESSION: Mild bibasilar atelectasis/airspace disease. Electronically Signed   By: Romona Curls M.D.   On: 05/12/2023 17:09    Pending Labs Unresulted Labs (From admission, onward)     Start     Ordered   05/19/23 0500  Creatinine, serum  (enoxaparin (LOVENOX)    CrCl >/= 30 ml/min)  Weekly,   R     Comments: while on enoxaparin therapy    05/12/23 1952   05/13/23 0500  Hepatic function panel  Tomorrow morning,   R        05/12/23 1943   05/13/23 0500  APTT  Tomorrow morning,   R        05/12/23 1952   05/13/23 0500  Protime-INR  Tomorrow morning,   R        05/12/23 1952   05/13/23 0500  Basic metabolic panel  Tomorrow morning,   R        05/12/23 1952   05/13/23 0500  CBC  Tomorrow morning,   R        05/12/23 1952   05/12/23 1951  CBC  (enoxaparin (LOVENOX)    CrCl >/= 30 ml/min)  Once,   R       Comments: Baseline for enoxaparin therapy IF NOT ALREADY DRAWN.  Notify MD if PLT < 100 K.    05/12/23 1952   05/12/23 1951  Creatinine, serum  (enoxaparin (LOVENOX)    CrCl >/= 30 ml/min)  Once,   R       Comments: Baseline for enoxaparin therapy IF NOT ALREADY DRAWN.    05/12/23 1952   05/12/23 1943  Protein / creatinine ratio, urine  Once,   URGENT         05/12/23 1942            Vitals/Pain Today's Vitals   05/12/23 1753 05/12/23 1800 05/12/23 1811 05/12/23 2000  BP:  (!) 157/102  (!) 166/137  Pulse: 64 (!) 50 66 65  Resp: 14 12 20 20   Temp:      SpO2: 95% 92% 96% 94%  Weight:      Height:      PainSc:        Isolation Precautions No active isolations  Medications Medications  labetalol (NORMODYNE) injection 20 mg (has no administration in time range)  hydrALAZINE (APRESOLINE) injection 10 mg (has no administration in time range)  furosemide (LASIX) injection 40 mg (has no administration in time range)  furosemide (LASIX) tablet 40 mg (has no administration in time range)  ALPRAZolam (XANAX) tablet 0.25 mg (has no administration in time range)  dofetilide (TIKOSYN) capsule 250 mcg (has no administration in time range)  atorvastatin (LIPITOR) tablet 40 mg (has no administration in time range)  lisinopril (ZESTRIL) tablet 20 mg (has no administration in time range)  metoprolol succinate (TOPROL-XL) 24 hr tablet 25 mg (has no administration in time range)  potassium chloride (KLOR-CON) CR tablet 10 mEq (has no administration in time range)  enoxaparin (LOVENOX) injection 40 mg (has no administration in time range)  acetaminophen (TYLENOL) tablet 650 mg (has no administration in time range)    Or  acetaminophen (TYLENOL) suppository 650 mg (has no administration in time range)  polyethylene glycol (MIRALAX / GLYCOLAX) packet 17 g (has no administration in time range)  sodium chloride flush (NS) 0.9 % injection 3  mL (has no administration in time range)    Mobility walks     Focused Assessments    R Recommendations: See Admitting Provider Note  Report given to:   Additional Notes: Patient is A&Ox4, swallows pills whole, walks at home without assistance. Family is at bedside and I did not give the lasix d/t him coming upstairs and I didn't want him to need to go on the way up! He and family are both very nice and  helpful!

## 2023-05-12 NOTE — ED Notes (Signed)
EKG performed per Dr Maryjean Ka, qtc improved to 449.

## 2023-05-12 NOTE — Telephone Encounter (Signed)
[name] is a 77 y.o. male with a hx of CAD, s/p CABG, atrial fibrillation, HFmrEF (heart failure with mildly reduced ejection fraction). He had a fall with significant subdural hematoma. He has been off anticoagulation since 02/2023. He and his wife contacted the answering service for shortness of breath and chest pain. He started having shortness of breath a couple of days ago. He describes orthopnea and dyspnea on exertion. He has had to sit up at night. He also notes intermittent L sided chest pain. This comes on at rest. It is similar to pain in the past but this is much worse. He has not used NTG. His wife notes his feet are swollen. BP last night was 188/122. It came down to 193/92. He has not checked it since.  Symptoms sound c/w acute heart failure +/- unstable angina. Also, blood pressure out of control and needs urgent management.  PLAN: -Advised pt to go to [facility] ED for evaluation. He agreed with plan. Tereso Newcomer, PA-C    05/12/2023 1:24 PM

## 2023-05-13 ENCOUNTER — Inpatient Hospital Stay (HOSPITAL_COMMUNITY): Payer: Medicare Other

## 2023-05-13 DIAGNOSIS — R609 Edema, unspecified: Secondary | ICD-10-CM | POA: Diagnosis not present

## 2023-05-13 DIAGNOSIS — I5023 Acute on chronic systolic (congestive) heart failure: Secondary | ICD-10-CM

## 2023-05-13 DIAGNOSIS — I5033 Acute on chronic diastolic (congestive) heart failure: Secondary | ICD-10-CM

## 2023-05-13 LAB — CBC
HCT: 40.5 % (ref 39.0–52.0)
Hemoglobin: 12.7 g/dL — ABNORMAL LOW (ref 13.0–17.0)
MCH: 30.5 pg (ref 26.0–34.0)
MCHC: 31.4 g/dL (ref 30.0–36.0)
MCV: 97.1 fL (ref 80.0–100.0)
Platelets: 224 10*3/uL (ref 150–400)
RBC: 4.17 MIL/uL — ABNORMAL LOW (ref 4.22–5.81)
RDW: 14.9 % (ref 11.5–15.5)
WBC: 8.8 10*3/uL (ref 4.0–10.5)
nRBC: 0 % (ref 0.0–0.2)

## 2023-05-13 LAB — HEPATIC FUNCTION PANEL
ALT: 39 U/L (ref 0–44)
AST: 32 U/L (ref 15–41)
Albumin: 3 g/dL — ABNORMAL LOW (ref 3.5–5.0)
Alkaline Phosphatase: 100 U/L (ref 38–126)
Bilirubin, Direct: 0.1 mg/dL (ref 0.0–0.2)
Indirect Bilirubin: 0.9 mg/dL (ref 0.3–0.9)
Total Bilirubin: 1 mg/dL (ref 0.3–1.2)
Total Protein: 6.2 g/dL — ABNORMAL LOW (ref 6.5–8.1)

## 2023-05-13 LAB — ECHOCARDIOGRAM COMPLETE
AR max vel: 3.2 cm2
AV Area VTI: 3.23 cm2
AV Area mean vel: 3.03 cm2
AV Mean grad: 2 mmHg
AV Peak grad: 4.2 mmHg
Ao pk vel: 1.03 m/s
Area-P 1/2: 4.36 cm2
Height: 65 in
S' Lateral: 3.5 cm
Weight: 2119.94 [oz_av]

## 2023-05-13 LAB — BASIC METABOLIC PANEL
Anion gap: 10 (ref 5–15)
BUN: 13 mg/dL (ref 8–23)
CO2: 26 mmol/L (ref 22–32)
Calcium: 8.9 mg/dL (ref 8.9–10.3)
Chloride: 105 mmol/L (ref 98–111)
Creatinine, Ser: 1.08 mg/dL (ref 0.61–1.24)
GFR, Estimated: >60 mL/min (ref >60)
Glucose, Bld: 98 mg/dL (ref 70–99)
Potassium: 3.4 mmol/L — ABNORMAL LOW (ref 3.5–5.1)
Sodium: 141 mmol/L (ref 135–145)

## 2023-05-13 LAB — PROTIME-INR
INR: 1 (ref 0.8–1.2)
Prothrombin Time: 13.6 s (ref 11.4–15.2)

## 2023-05-13 LAB — APTT: aPTT: 37 seconds — ABNORMAL HIGH (ref 24–36)

## 2023-05-13 MED ORDER — LISINOPRIL 5 MG PO TABS: 5.0000 mg | ORAL_TABLET | Freq: Every day | ORAL | Status: DC

## 2023-05-13 MED ORDER — FUROSEMIDE 10 MG/ML IJ SOLN
40.0000 mg | Freq: Once | INTRAMUSCULAR | Status: AC
  Administered 2023-05-13: 40 mg via INTRAVENOUS
  Filled 2023-05-13: qty 4

## 2023-05-13 MED ORDER — EMPAGLIFLOZIN 10 MG PO TABS
10.0000 mg | ORAL_TABLET | Freq: Every day | ORAL | Status: DC
  Administered 2023-05-13 – 2023-05-14 (×2): 10 mg via ORAL
  Filled 2023-05-13 (×2): qty 1

## 2023-05-13 MED ORDER — IOHEXOL 350 MG/ML SOLN
100.0000 mL | Freq: Once | INTRAVENOUS | Status: AC | PRN
  Administered 2023-05-13: 100 mL via INTRAVENOUS

## 2023-05-13 MED ORDER — POTASSIUM CHLORIDE CRYS ER 20 MEQ PO TBCR
40.0000 meq | EXTENDED_RELEASE_TABLET | Freq: Two times a day (BID) | ORAL | Status: AC
  Administered 2023-05-13 (×2): 40 meq via ORAL
  Filled 2023-05-13: qty 4
  Filled 2023-05-13: qty 2

## 2023-05-13 NOTE — Progress Notes (Signed)
PROGRESS NOTE    [name]  SAY:[id number] DOB: 04-Feb-1946 DOA: 05/12/2023 PCP: Ian Stabile, MD  77/M with history of paroxysmal atrial fibrillation, CHF with mildly reduced EF, recent subdural hematoma with unsuccessful middle meningeal artery embolization by neurosurgery 8/20, reports having chronic intermittent lower extremity edema for a long time, last week noticed increased shortness of breath with activity and orthopnea.  Presented to the ED 9/14, labs with creatinine 1.1, BNP 713, hemoglobin 11.3, chest x-ray with bibasilar atelectasis,   Subjective: -Feels better, breathing is improving  Assessment and Plan:  Acute on chronic systolic CHF -Last echo 2017 with EF of 45-50% -Admitted with volume overload, continue IV Lasix 1 more day, add Jardiance -Continue lisinopril, Toprol -Follow-up repeat echo -Volume status improving, he is 2 L negative -Significant dietary indiscretion, discussed with patient wife to cut down fast food breakfast every day  Paroxysmal atrial fibrillation -In sinus rhythm, continue Toprol, dofetilide,  -Anticoagulation on hold following recent subdural hematoma  Recent subdural hematoma -Recently underwent unsuccessful middle meningeal artery embolization -Anticoagulation had been on hold   Hypertension BP controlled.  -Continue Lisinopril 20mg  daily and Toprol XL 25mg  daily.   Coronary Artery Disease s/p CABG in 2008. No chest pain to suggest angina. -Continue Lipitor 40mg  daily and nitroglycerin as needed.   Hyperlipidemia LDL 62 in June 2024. This is slightly above target.   -Continue Lipitor 40mg  daily.    DVT prophylaxis:SCDs Code Status: Full code Family Communication: Wife at bedside Disposition Plan: Home likely 1 to 2 days  Consultants:    Procedures:   Antimicrobials:    Objective: Vitals:   05/12/23 2037 05/13/23 0037 05/13/23 0544 05/13/23 0722  BP: (!) 196/93 (!) 150/73 (!) 144/94 130/89  Pulse: 68  70 (!) 56   Resp: 18 15 18  (!) 21  Temp: 97.9 F (36.6 C) 98.3 F (36.8 C) 98.3 F (36.8 C) 98.2 F (36.8 C)  TempSrc: Oral Oral Oral Oral  SpO2: 91% 94% 93% 93%  Weight:   60.1 kg   Height: 5\' 5"  (1.651 m)       Intake/Output Summary (Last 24 hours) at 05/13/2023 1111 Last data filed at 05/13/2023 0554 Gross per 24 hour  Intake 480 ml  Output 2775 ml  Net -2295 ml   Filed Weights   05/12/23 1559 05/12/23 2029 05/13/23 0544  Weight: 63.5 kg 62.4 kg 60.1 kg    Examination:  General exam: Appears calm and comfortable, AAOx3 HEENT: Pos JVD Respiratory system: Clear to auscultation Cardiovascular system: S1 & S2 heard, RRR.  Abd: nondistended, soft and nontender.Normal bowel sounds heard. Central nervous system: Alert and oriented. No focal neurological deficits. Extremities: no edema Skin: No rashes Psychiatry:  Mood & affect appropriate.     Data Reviewed:   CBC: Recent Labs  Lab 05/12/23 1612 05/12/23 2257 05/13/23 0348  WBC 8.0 8.9 8.8  HGB 11.3* 11.7* 12.7*  HCT 37.4* 38.1* 40.5  MCV 99.5 97.4 97.1  PLT 225 223 224   Basic Metabolic Panel: Recent Labs  Lab 05/12/23 1612 05/12/23 2257 05/13/23 0348  NA 141  --  141  K 4.5  --  3.4*  CL 104  --  105  CO2 22  --  26  GLUCOSE 97  --  98  BUN 16  --  13  CREATININE 1.16 1.21 1.08  CALCIUM 8.8*  --  8.9   GFR: Estimated Creatinine Clearance: 47.1 mL/min (by C-G formula based on SCr of 1.08 mg/dL). Liver  Function Tests: Recent Labs  Lab 05/13/23 0348  AST 32  ALT 39  ALKPHOS 100  BILITOT 1.0  PROT 6.2*  ALBUMIN 3.0*   No results for input(s): "LIPASE", "AMYLASE" in the last 168 hours. No results for input(s): "AMMONIA" in the last 168 hours. Coagulation Profile: Recent Labs  Lab 05/13/23 0348  INR 1.0   Cardiac Enzymes: No results for input(s): "CKTOTAL", "CKMB", "CKMBINDEX", "TROPONINI" in the last 168 hours. BNP (last 3 results) No results for input(s): "PROBNP" in the last 8760  hours. HbA1C: No results for input(s): "HGBA1C" in the last 72 hours. CBG: No results for input(s): "GLUCAP" in the last 168 hours. Lipid Profile: No results for input(s): "CHOL", "HDL", "LDLCALC", "TRIG", "CHOLHDL", "LDLDIRECT" in the last 72 hours. Thyroid Function Tests: No results for input(s): "TSH", "T4TOTAL", "FREET4", "T3FREE", "THYROIDAB" in the last 72 hours. Anemia Panel: No results for input(s): "VITAMINB12", "FOLATE", "FERRITIN", "TIBC", "IRON", "RETICCTPCT" in the last 72 hours. Urine analysis:    Component Value Date/Time   COLORURINE YELLOW 04/02/2023 2259   APPEARANCEUR CLEAR 04/02/2023 2259   LABSPEC 1.020 04/02/2023 2259   PHURINE 5.0 04/02/2023 2259   GLUCOSEU NEGATIVE 04/02/2023 2259   HGBUR SMALL (A) 04/02/2023 2259   BILIRUBINUR NEGATIVE 04/02/2023 2259   BILIRUBINUR negative 01/30/2020 1257   KETONESUR NEGATIVE 04/02/2023 2259   PROTEINUR 100 (A) 04/02/2023 2259   UROBILINOGEN 0.2 01/30/2020 1257   UROBILINOGEN 1.0 03/10/2007 1204   NITRITE NEGATIVE 04/02/2023 2259   LEUKOCYTESUR NEGATIVE 04/02/2023 2259   Sepsis Labs: @LABRCNTIP (procalcitonin:4,lacticidven:4)  )No results found for this or any previous visit (from the past 240 hour(s)).   Radiology Studies: VAS [place] LOWER EXTREMITY VENOUS (DVT)  Result Date: 05/13/2023  Lower Venous DVT Study Patient Name:  [name]  Date of Exam:   05/13/2023 Medical Rec #: [id number]       Accession #:    [id number] Date of Birth: 29-Oct-1945        Patient Gender: M Patient Age:   16 years Exam Location:  [facility] Procedure:      VAS [place] LOWER EXTREMITY VENOUS (DVT) Referring Phys: [name] [name] --------------------------------------------------------------------------------  Indications: Edema.  Comparison Study: No prior study Performing Technologist: Sherren Kerns RVS  Examination Guidelines: A complete evaluation includes B-mode imaging, spectral Doppler, color Doppler, and power Doppler as needed of all  accessible portions of each vessel. Bilateral testing is considered an integral part of a complete examination. Limited examinations for reoccurring indications may be performed as noted. The reflux portion of the exam is performed with the patient in reverse Trendelenburg.  +---------+---------------+---------+-----------+----------+--------------+ RIGHT    CompressibilityPhasicitySpontaneityPropertiesThrombus Aging +---------+---------------+---------+-----------+----------+--------------+ CFV      Full           Yes      Yes                                 +---------+---------------+---------+-----------+----------+--------------+ SFJ      Full                                                        +---------+---------------+---------+-----------+----------+--------------+ FV Prox  Full                                                        +---------+---------------+---------+-----------+----------+--------------+  FV Mid   Full                                                        +---------+---------------+---------+-----------+----------+--------------+ FV DistalFull                                                        +---------+---------------+---------+-----------+----------+--------------+ PFV      Full                                                        +---------+---------------+---------+-----------+----------+--------------+ POP      Full           Yes      Yes                                 +---------+---------------+---------+-----------+----------+--------------+ PTV      Full                                                        +---------+---------------+---------+-----------+----------+--------------+ PERO     Full                                                        +---------+---------------+---------+-----------+----------+--------------+   +---------+---------------+---------+-----------+----------+--------------+  LEFT     CompressibilityPhasicitySpontaneityPropertiesThrombus Aging +---------+---------------+---------+-----------+----------+--------------+ CFV      Full           Yes      Yes                                 +---------+---------------+---------+-----------+----------+--------------+ SFJ      Full                                                        +---------+---------------+---------+-----------+----------+--------------+ FV Prox  Full                                                        +---------+---------------+---------+-----------+----------+--------------+ FV Mid   Full                                                        +---------+---------------+---------+-----------+----------+--------------+  FV DistalFull                                                        +---------+---------------+---------+-----------+----------+--------------+ PFV      Full                                                        +---------+---------------+---------+-----------+----------+--------------+ POP      Full           Yes      Yes                                 +---------+---------------+---------+-----------+----------+--------------+ PTV      Full                                                        +---------+---------------+---------+-----------+----------+--------------+ PERO     Full                                                        +---------+---------------+---------+-----------+----------+--------------+     Summary: BILATERAL: - No evidence of deep vein thrombosis seen in the lower extremities, bilaterally. -No evidence of popliteal cyst, bilaterally.   *See table(s) above for measurements and observations.    Preliminary    CT Angio Chest/Abd/Pel for Dissection W and/or W/WO  Result Date: 05/13/2023 CLINICAL DATA:  Left-sided chest pain EXAM: CT ANGIOGRAPHY CHEST, ABDOMEN AND PELVIS TECHNIQUE: Non-contrast CT of the  chest was initially obtained. Multidetector CT imaging through the chest, abdomen and pelvis was performed using the standard protocol during bolus administration of intravenous contrast. Multiplanar reconstructed images and MIPs were obtained and reviewed to evaluate the vascular anatomy. RADIATION DOSE REDUCTION: This exam was performed according to the departmental dose-optimization program which includes automated exposure control, adjustment of the mA and/or kV according to patient size and/or use of iterative reconstruction technique. CONTRAST:  OMNIPAQUE IOHEXOL 350 MG/ML SOLN COMPARISON:  04/03/2019 FINDINGS: CTA CHEST FINDINGS Cardiovascular: Initial noncontrast images show no hyperdense crescent to suggest acute aortic injury. Post-contrast images demonstrate atherosclerotic calcifications. No aneurysmal dilatation of the thoracic aorta is noted. No dissection is seen. Changes of prior coronary bypass grafting are noted. The pulmonary artery although not timed for embolus evaluation shows no filling defect to suggest pulmonary embolism. Coronary calcifications are noted. Mediastinum/Nodes: Thoracic inlet is within normal limits. No hilar or mediastinal adenopathy is noted. The esophagus as visualized is within normal limits. Lungs/Pleura: Diffuse emphysematous changes are noted throughout both lungs. No focal infiltrate or sizable effusion is seen. No parenchymal nodules are noted. Mucoid impaction is again seen in the left lower lobe bronchial tree stable from the prior study. Musculoskeletal: No acute rib abnormality is noted. Mild degenerative changes of the thoracic spine are seen.  Review of the MIP images confirms the above findings. CTA ABDOMEN AND PELVIS FINDINGS VASCULAR Aorta: Atherosclerotic calcifications are noted. Focal dilatation of the infrarenal aorta to 3.3 cm is noted. 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 This may be in part due to median arcuate  ligament compression. Mild poststenotic dilatation is seen. SMA: Patent without evidence of aneurysm, dissection, vasculitis or significant stenosis. Renals: Both renal arteries are patent without evidence of aneurysm, dissection, vasculitis, fibromuscular dysplasia or significant stenosis. Dual renal arteries are noted on the right. IMA: Occluded at its origin. Reconstitution via the superior mesenteric artery is noted. Inflow: Iliacs demonstrate atherosclerotic calcification. Aneurysmal dilatation of the right common femoral artery to 12 mm is noted. Veins: No specific venous abnormality is noted. Review of the MIP images confirms the above findings. NON-VASCULAR Hepatobiliary: No focal liver abnormality is seen. No gallstones, gallbladder wall thickening, or biliary dilatation. Pancreas: Unremarkable. No pancreatic ductal dilatation or surrounding inflammatory changes. Spleen: Normal in size without focal abnormality. Adrenals/Urinary Tract: Adrenal glands are within normal limits. Kidneys demonstrate a normal enhancement pattern bilaterally. No calculi or obstructive changes are seen. The bladder is well distended. Stomach/Bowel: Scattered diverticular change of the colon is noted without evidence of diverticulitis. Mild retained fecal material is seen without obstructive change consistent with mild constipation. The appendix is within normal limits. The small bowel and stomach are unremarkable. Lymphatic: No lymphadenopathy is noted. Reproductive: Prostate is unremarkable. Other: No abdominal wall hernia or abnormality. No abdominopelvic ascites. Musculoskeletal: Old pubic rami fractures and left iliac wing fractures are seen. Degenerative changes of lumbar spine are noted. Review of the MIP images confirms the above findings. IMPRESSION: CTA of the chest: No evidence of aneurysmal dilatation or dissection of the thoracic aorta. Chronic mucoid impaction in the left lower lobe. CTA of the abdomen and pelvis:  Abdominal aortic aneurysm measuring 3.3 cm. Recommend surveillance ultrasound in 3 years. Reference: Journal of Vascular Surgery 67.1 (2018): 2-77. J Am Coll Radiol [phone]. 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 Diverticulosis without diverticulitis. Electronically Signed   By: Alcide Clever M.D.   On: 05/13/2023 01:51   DG Chest 2 View  Result Date: 05/12/2023 CLINICAL DATA:  Shortness of breath for the past week. EXAM: CHEST - 2 VIEW COMPARISON:  Chest radiograph dated 04/02/2023. FINDINGS: The heart size and mediastinal contours are within normal limits. Vascular calcifications are seen in the aortic arch. Mild bibasilar atelectasis/airspace disease. No pleural effusion or pneumothorax. Degenerative changes are seen in the spine. IMPRESSION: Mild bibasilar atelectasis/airspace disease. Electronically Signed   By: Romona Curls M.D.   On: 05/12/2023 17:09     Scheduled Meds:  ALPRAZolam  0.25 mg Oral QHS   atorvastatin  40 mg Oral QHS   dofetilide  250 mcg Oral BID   enoxaparin (LOVENOX) injection  40 mg Subcutaneous Q24H   [START ON 05/14/2023] lisinopril  5 mg Oral Daily   metoprolol succinate  25 mg Oral BID   potassium chloride  40 mEq Oral BID   sodium chloride flush  3 mL Intravenous Q12H   Continuous Infusions:   LOS: 1 day    Time spent:    Zannie Cove, MD [facility]   05/13/2023, 11:11 AM

## 2023-05-13 NOTE — Progress Notes (Signed)
VASCULAR LAB    Bilateral lower extremity venous duplex has been performed.  See CV proc for preliminary results.   Geovana Gebel, RVT 05/13/2023, 10:06 AM

## 2023-05-14 ENCOUNTER — Other Ambulatory Visit (HOSPITAL_COMMUNITY): Payer: Self-pay

## 2023-05-14 ENCOUNTER — Telehealth: Payer: Self-pay | Admitting: *Deleted

## 2023-05-14 DIAGNOSIS — I5033 Acute on chronic diastolic (congestive) heart failure: Secondary | ICD-10-CM

## 2023-05-14 LAB — BASIC METABOLIC PANEL
Anion gap: 7 (ref 5–15)
BUN: 23 mg/dL (ref 8–23)
CO2: 26 mmol/L (ref 22–32)
Calcium: 8.8 mg/dL — ABNORMAL LOW (ref 8.9–10.3)
Chloride: 107 mmol/L (ref 98–111)
Creatinine, Ser: 1.49 mg/dL — ABNORMAL HIGH (ref 0.61–1.24)
GFR, Estimated: 47 mL/min — ABNORMAL LOW (ref >60)
Glucose, Bld: 105 mg/dL — ABNORMAL HIGH (ref 70–99)
Potassium: 4.2 mmol/L (ref 3.5–5.1)
Sodium: 140 mmol/L (ref 135–145)

## 2023-05-14 LAB — MAGNESIUM: Magnesium: 2.1 mg/dL (ref 1.7–2.4)

## 2023-05-14 MED ORDER — POTASSIUM CHLORIDE CRYS ER 20 MEQ PO TBCR
20.0000 meq | EXTENDED_RELEASE_TABLET | Freq: Every day | ORAL | 1 refills | Status: DC
  Filled 2023-05-14: qty 30, 30d supply, fill #0

## 2023-05-14 MED ORDER — EMPAGLIFLOZIN 10 MG PO TABS
10.0000 mg | ORAL_TABLET | Freq: Every day | ORAL | 1 refills | Status: DC
Start: 2023-05-14 — End: 2023-07-02
  Filled 2023-05-14: qty 30, 30d supply, fill #0

## 2023-05-14 MED ORDER — FUROSEMIDE 20 MG PO TABS
20.0000 mg | ORAL_TABLET | Freq: Every day | ORAL | 1 refills | Status: DC
  Filled 2023-05-14: qty 30, 30d supply, fill #0

## 2023-05-14 NOTE — Discharge Summary (Signed)
Systemic Diam: 2.00 cm Dina Rich MD Electronically signed by Dina Rich MD Signature Date/Time: 05/13/2023/11:54:28 AM    Final    VAS [place] LOWER EXTREMITY VENOUS (DVT)  Result Date: 05/13/2023  Lower Venous DVT Study Patient Name:  [name]  Date of Exam:   05/13/2023 Medical Rec #: [id number]       Accession #:    [id number] Date of Birth: Jan 12, 1946        Patient Gender: M Patient Age:   77 years Exam Location:  [facility] Procedure:      VAS [place] LOWER EXTREMITY VENOUS (DVT) Referring Phys: [facility] GOEL --------------------------------------------------------------------------------  Indications: Edema.  Comparison Study: No prior study Performing Technologist: Sherren Kerns RVS  Examination Guidelines: A complete evaluation includes B-mode imaging, spectral Doppler, color Doppler, and power Doppler as needed of all accessible portions of each vessel. Bilateral testing is considered an integral part of a complete examination. Limited examinations for reoccurring indications may be performed as noted. The reflux portion of the exam is performed with the patient in reverse Trendelenburg.  +---------+---------------+---------+-----------+----------+--------------+ RIGHT    CompressibilityPhasicitySpontaneityPropertiesThrombus Aging  +---------+---------------+---------+-----------+----------+--------------+ CFV      Full           Yes      Yes                                 +---------+---------------+---------+-----------+----------+--------------+ SFJ      Full                                                        +---------+---------------+---------+-----------+----------+--------------+ FV Prox  Full                                                        +---------+---------------+---------+-----------+----------+--------------+ FV Mid   Full                                                        +---------+---------------+---------+-----------+----------+--------------+ FV DistalFull                                                        +---------+---------------+---------+-----------+----------+--------------+ PFV      Full                                                        +---------+---------------+---------+-----------+----------+--------------+ POP      Full           Yes      Yes                                 +---------+---------------+---------+-----------+----------+--------------+  Physician Discharge Summary  [name] [id number]:[id number] DOB: Oct 07, 1945 DOA: 05/12/2023  PCP: Benita Stabile, MD  Admit date: 05/12/2023 Discharge date: 05/14/2023  Time spent: 45 minutes  Recommendations for Outpatient Follow-up:  Heart failure TOC clinic in 2 weeks Cardiology Dr. Anne Fu in 1 month Resume anticoagulation at follow-up if stable   Discharge Diagnoses:  Principal Problem: Acute on chronic diastolic CHF Paroxysmal atrial fibrillation Recent subdural hematoma Hypertension CAD, CABG Dyslipidemia  Discharge Condition: Improved  Diet recommendation: Low so DM, heart healthy  Filed Weights   05/12/23 2029 05/13/23 0544 05/14/23 0340  Weight: 62.4 kg 60.1 kg 59.1 kg    History of present illness:  77/M with history of paroxysmal atrial fibrillation, CHF with mildly reduced EF, recent subdural hematoma with unsuccessful middle meningeal artery embolization by neurosurgery 8/20, reports having chronic intermittent lower extremity edema for a long time, last week noticed increased shortness of breath with activity and orthopnea.  Presented to the ED 9/14, labs with creatinine 1.1, BNP 713  Hospital Course:   Acute on chronic systolic CHF -Last Ian 2017 with EF of 45-50% -Admitted with volume overload, diuresed with IV Lasix he is down 10 LB  -Volume status improved, weaned off O2  -Changed to Lasix 20 Mg daily and started on Jardiance, Toprol continued -mild bump in creatinine today, lisinopril discontinued  -Significant dietary indiscretion, discussed with patient wife to cut down fast food breakfast every day -Will request follow-up in heart failure TOC clinic in 2 weeks, follow-up with Dr. Anne Fu in 1 month   Paroxysmal atrial fibrillation -In sinus rhythm, continue Toprol, dofetilide,  -Anticoagulation on hold following recent subdural hematoma -Recommend resumption in 4 to 6 weeks  Recent subdural hematoma -Recently underwent unsuccessful middle meningeal  artery embolization 8/20 -Anticoagulation had been on hold prior to admission, see discussion above   Hypertension BP controlled.  -Continue Lisinopril 20mg  daily and Toprol XL 25mg  daily.   Coronary Artery Disease s/p CABG in 2008. No chest pain to suggest angina. -Continue Toprol, Lipitor, Eliquis on hold after SDH   Hyperlipidemia LDL 62 in June 2024. This is slightly above target.   -Continue Lipitor 40mg  daily.    Discharge Exam: Vitals:   05/14/23 0340 05/14/23 0930  BP: 105/68 110/70  Pulse: 63 63  Resp: 19   Temp: 98.1 F (36.7 C)   SpO2: 93%    Gen: Awake, Alert, Oriented X 3,  HEENT: no JVD Lungs: Good air movement bilaterally, CTAB CVS: S1S2/RRR Abd: soft, Non tender, non distended, BS present Extremities: No edema Skin: no new rashes on exposed skin   Discharge Instructions   Discharge Instructions     AMB Referral to Community Care Coordinaton (ACO Patients)   Complete by: As directed    Hospital Follow up referral/Care Coordination:  HF  Primary Care Provider:  Benita Stabile, MD  Insurance plan: Medicare ACO REACH  Please assign to [facility] RN Care Coordinator for post hospital care coordination for readmission prevention follow up calls new diagnosis, new medications and assess for further needs.  Questions please call:   Charlesetta Shanks, RN, BSN, CCM [place]  [facility] [place] Liaison Direct Dial: [phone] or secure chat Website: [email or web address]     Reason for Referral: Care Coordination (ACO patients)   Disease managment services needed: Nurse Case Manager   Diagnoses of: Heart Failure   Expected date of contact: Urgent - 7 Days   Diet - low sodium heart healthy  Systemic Diam: 2.00 cm Dina Rich MD Electronically signed by Dina Rich MD Signature Date/Time: 05/13/2023/11:54:28 AM    Final    VAS [place] LOWER EXTREMITY VENOUS (DVT)  Result Date: 05/13/2023  Lower Venous DVT Study Patient Name:  [name]  Date of Exam:   05/13/2023 Medical Rec #: [id number]       Accession #:    [id number] Date of Birth: Jan 12, 1946        Patient Gender: M Patient Age:   77 years Exam Location:  [facility] Procedure:      VAS [place] LOWER EXTREMITY VENOUS (DVT) Referring Phys: [facility] GOEL --------------------------------------------------------------------------------  Indications: Edema.  Comparison Study: No prior study Performing Technologist: Sherren Kerns RVS  Examination Guidelines: A complete evaluation includes B-mode imaging, spectral Doppler, color Doppler, and power Doppler as needed of all accessible portions of each vessel. Bilateral testing is considered an integral part of a complete examination. Limited examinations for reoccurring indications may be performed as noted. The reflux portion of the exam is performed with the patient in reverse Trendelenburg.  +---------+---------------+---------+-----------+----------+--------------+ RIGHT    CompressibilityPhasicitySpontaneityPropertiesThrombus Aging  +---------+---------------+---------+-----------+----------+--------------+ CFV      Full           Yes      Yes                                 +---------+---------------+---------+-----------+----------+--------------+ SFJ      Full                                                        +---------+---------------+---------+-----------+----------+--------------+ FV Prox  Full                                                        +---------+---------------+---------+-----------+----------+--------------+ FV Mid   Full                                                        +---------+---------------+---------+-----------+----------+--------------+ FV DistalFull                                                        +---------+---------------+---------+-----------+----------+--------------+ PFV      Full                                                        +---------+---------------+---------+-----------+----------+--------------+ POP      Full           Yes      Yes                                 +---------+---------------+---------+-----------+----------+--------------+  Physician Discharge Summary  [name] [id number]:[id number] DOB: Oct 07, 1945 DOA: 05/12/2023  PCP: Benita Stabile, MD  Admit date: 05/12/2023 Discharge date: 05/14/2023  Time spent: 45 minutes  Recommendations for Outpatient Follow-up:  Heart failure TOC clinic in 2 weeks Cardiology Dr. Anne Fu in 1 month Resume anticoagulation at follow-up if stable   Discharge Diagnoses:  Principal Problem: Acute on chronic diastolic CHF Paroxysmal atrial fibrillation Recent subdural hematoma Hypertension CAD, CABG Dyslipidemia  Discharge Condition: Improved  Diet recommendation: Low so DM, heart healthy  Filed Weights   05/12/23 2029 05/13/23 0544 05/14/23 0340  Weight: 62.4 kg 60.1 kg 59.1 kg    History of present illness:  77/M with history of paroxysmal atrial fibrillation, CHF with mildly reduced EF, recent subdural hematoma with unsuccessful middle meningeal artery embolization by neurosurgery 8/20, reports having chronic intermittent lower extremity edema for a long time, last week noticed increased shortness of breath with activity and orthopnea.  Presented to the ED 9/14, labs with creatinine 1.1, BNP 713  Hospital Course:   Acute on chronic systolic CHF -Last Ian 2017 with EF of 45-50% -Admitted with volume overload, diuresed with IV Lasix he is down 10 LB  -Volume status improved, weaned off O2  -Changed to Lasix 20 Mg daily and started on Jardiance, Toprol continued -mild bump in creatinine today, lisinopril discontinued  -Significant dietary indiscretion, discussed with patient wife to cut down fast food breakfast every day -Will request follow-up in heart failure TOC clinic in 2 weeks, follow-up with Dr. Anne Fu in 1 month   Paroxysmal atrial fibrillation -In sinus rhythm, continue Toprol, dofetilide,  -Anticoagulation on hold following recent subdural hematoma -Recommend resumption in 4 to 6 weeks  Recent subdural hematoma -Recently underwent unsuccessful middle meningeal  artery embolization 8/20 -Anticoagulation had been on hold prior to admission, see discussion above   Hypertension BP controlled.  -Continue Lisinopril 20mg  daily and Toprol XL 25mg  daily.   Coronary Artery Disease s/p CABG in 2008. No chest pain to suggest angina. -Continue Toprol, Lipitor, Eliquis on hold after SDH   Hyperlipidemia LDL 62 in June 2024. This is slightly above target.   -Continue Lipitor 40mg  daily.    Discharge Exam: Vitals:   05/14/23 0340 05/14/23 0930  BP: 105/68 110/70  Pulse: 63 63  Resp: 19   Temp: 98.1 F (36.7 C)   SpO2: 93%    Gen: Awake, Alert, Oriented X 3,  HEENT: no JVD Lungs: Good air movement bilaterally, CTAB CVS: S1S2/RRR Abd: soft, Non tender, non distended, BS present Extremities: No edema Skin: no new rashes on exposed skin   Discharge Instructions   Discharge Instructions     AMB Referral to Community Care Coordinaton (ACO Patients)   Complete by: As directed    Hospital Follow up referral/Care Coordination:  HF  Primary Care Provider:  Benita Stabile, MD  Insurance plan: Medicare ACO REACH  Please assign to [facility] RN Care Coordinator for post hospital care coordination for readmission prevention follow up calls new diagnosis, new medications and assess for further needs.  Questions please call:   Charlesetta Shanks, RN, BSN, CCM [place]  [facility] [place] Liaison Direct Dial: [phone] or secure chat Website: [email or web address]     Reason for Referral: Care Coordination (ACO patients)   Disease managment services needed: Nurse Case Manager   Diagnoses of: Heart Failure   Expected date of contact: Urgent - 7 Days   Diet - low sodium heart healthy  Systemic Diam: 2.00 cm Dina Rich MD Electronically signed by Dina Rich MD Signature Date/Time: 05/13/2023/11:54:28 AM    Final    VAS [place] LOWER EXTREMITY VENOUS (DVT)  Result Date: 05/13/2023  Lower Venous DVT Study Patient Name:  [name]  Date of Exam:   05/13/2023 Medical Rec #: [id number]       Accession #:    [id number] Date of Birth: Jan 12, 1946        Patient Gender: M Patient Age:   77 years Exam Location:  [facility] Procedure:      VAS [place] LOWER EXTREMITY VENOUS (DVT) Referring Phys: [facility] GOEL --------------------------------------------------------------------------------  Indications: Edema.  Comparison Study: No prior study Performing Technologist: Sherren Kerns RVS  Examination Guidelines: A complete evaluation includes B-mode imaging, spectral Doppler, color Doppler, and power Doppler as needed of all accessible portions of each vessel. Bilateral testing is considered an integral part of a complete examination. Limited examinations for reoccurring indications may be performed as noted. The reflux portion of the exam is performed with the patient in reverse Trendelenburg.  +---------+---------------+---------+-----------+----------+--------------+ RIGHT    CompressibilityPhasicitySpontaneityPropertiesThrombus Aging  +---------+---------------+---------+-----------+----------+--------------+ CFV      Full           Yes      Yes                                 +---------+---------------+---------+-----------+----------+--------------+ SFJ      Full                                                        +---------+---------------+---------+-----------+----------+--------------+ FV Prox  Full                                                        +---------+---------------+---------+-----------+----------+--------------+ FV Mid   Full                                                        +---------+---------------+---------+-----------+----------+--------------+ FV DistalFull                                                        +---------+---------------+---------+-----------+----------+--------------+ PFV      Full                                                        +---------+---------------+---------+-----------+----------+--------------+ POP      Full           Yes      Yes                                 +---------+---------------+---------+-----------+----------+--------------+  Physician Discharge Summary  [name] [id number]:[id number] DOB: Oct 07, 1945 DOA: 05/12/2023  PCP: Benita Stabile, MD  Admit date: 05/12/2023 Discharge date: 05/14/2023  Time spent: 45 minutes  Recommendations for Outpatient Follow-up:  Heart failure TOC clinic in 2 weeks Cardiology Dr. Anne Fu in 1 month Resume anticoagulation at follow-up if stable   Discharge Diagnoses:  Principal Problem: Acute on chronic diastolic CHF Paroxysmal atrial fibrillation Recent subdural hematoma Hypertension CAD, CABG Dyslipidemia  Discharge Condition: Improved  Diet recommendation: Low so DM, heart healthy  Filed Weights   05/12/23 2029 05/13/23 0544 05/14/23 0340  Weight: 62.4 kg 60.1 kg 59.1 kg    History of present illness:  77/M with history of paroxysmal atrial fibrillation, CHF with mildly reduced EF, recent subdural hematoma with unsuccessful middle meningeal artery embolization by neurosurgery 8/20, reports having chronic intermittent lower extremity edema for a long time, last week noticed increased shortness of breath with activity and orthopnea.  Presented to the ED 9/14, labs with creatinine 1.1, BNP 713  Hospital Course:   Acute on chronic systolic CHF -Last Ian 2017 with EF of 45-50% -Admitted with volume overload, diuresed with IV Lasix he is down 10 LB  -Volume status improved, weaned off O2  -Changed to Lasix 20 Mg daily and started on Jardiance, Toprol continued -mild bump in creatinine today, lisinopril discontinued  -Significant dietary indiscretion, discussed with patient wife to cut down fast food breakfast every day -Will request follow-up in heart failure TOC clinic in 2 weeks, follow-up with Dr. Anne Fu in 1 month   Paroxysmal atrial fibrillation -In sinus rhythm, continue Toprol, dofetilide,  -Anticoagulation on hold following recent subdural hematoma -Recommend resumption in 4 to 6 weeks  Recent subdural hematoma -Recently underwent unsuccessful middle meningeal  artery embolization 8/20 -Anticoagulation had been on hold prior to admission, see discussion above   Hypertension BP controlled.  -Continue Lisinopril 20mg  daily and Toprol XL 25mg  daily.   Coronary Artery Disease s/p CABG in 2008. No chest pain to suggest angina. -Continue Toprol, Lipitor, Eliquis on hold after SDH   Hyperlipidemia LDL 62 in June 2024. This is slightly above target.   -Continue Lipitor 40mg  daily.    Discharge Exam: Vitals:   05/14/23 0340 05/14/23 0930  BP: 105/68 110/70  Pulse: 63 63  Resp: 19   Temp: 98.1 F (36.7 C)   SpO2: 93%    Gen: Awake, Alert, Oriented X 3,  HEENT: no JVD Lungs: Good air movement bilaterally, CTAB CVS: S1S2/RRR Abd: soft, Non tender, non distended, BS present Extremities: No edema Skin: no new rashes on exposed skin   Discharge Instructions   Discharge Instructions     AMB Referral to Community Care Coordinaton (ACO Patients)   Complete by: As directed    Hospital Follow up referral/Care Coordination:  HF  Primary Care Provider:  Benita Stabile, MD  Insurance plan: Medicare ACO REACH  Please assign to [facility] RN Care Coordinator for post hospital care coordination for readmission prevention follow up calls new diagnosis, new medications and assess for further needs.  Questions please call:   Charlesetta Shanks, RN, BSN, CCM [place]  [facility] [place] Liaison Direct Dial: [phone] or secure chat Website: [email or web address]     Reason for Referral: Care Coordination (ACO patients)   Disease managment services needed: Nurse Case Manager   Diagnoses of: Heart Failure   Expected date of contact: Urgent - 7 Days   Diet - low sodium heart healthy  Physician Discharge Summary  [name] [id number] DOB: Oct 07, 1945 DOA: 05/12/2023  PCP: Benita Stabile, MD  Admit date: 05/12/2023 Discharge date: 05/14/2023  Time spent: 45 minutes  Recommendations for Outpatient Follow-up:  Heart failure TOC clinic in 2 weeks Cardiology Dr. Anne Fu in 1 month Resume anticoagulation at follow-up if stable   Discharge Diagnoses:  Principal Problem: Acute on chronic diastolic CHF Paroxysmal atrial fibrillation Recent subdural hematoma Hypertension CAD, CABG Dyslipidemia  Discharge Condition: Improved  Diet recommendation: Low so DM, heart healthy  Filed Weights   05/12/23 2029 05/13/23 0544 05/14/23 0340  Weight: 62.4 kg 60.1 kg 59.1 kg    History of present illness:  77/M with history of paroxysmal atrial fibrillation, CHF with mildly reduced EF, recent subdural hematoma with unsuccessful middle meningeal artery embolization by neurosurgery 8/20, reports having chronic intermittent lower extremity edema for a long time, last week noticed increased shortness of breath with activity and orthopnea.  Presented to the ED 9/14, labs with creatinine 1.1, BNP 713  Hospital Course:   Acute on chronic systolic CHF -Last Ian 2017 with EF of 45-50% -Admitted with volume overload, diuresed with IV Lasix he is down 10 LB  -Volume status improved, weaned off O2  -Changed to Lasix 20 Mg daily and started on Jardiance, Toprol continued -mild bump in creatinine today, lisinopril discontinued  -Significant dietary indiscretion, discussed with patient wife to cut down fast food breakfast every day -Will request follow-up in heart failure TOC clinic in 2 weeks, follow-up with Dr. Anne Fu in 1 month   Paroxysmal atrial fibrillation -In sinus rhythm, continue Toprol, dofetilide,  -Anticoagulation on hold following recent subdural hematoma -Recommend resumption in 4 to 6 weeks  Recent subdural hematoma -Recently underwent unsuccessful middle meningeal  artery embolization 8/20 -Anticoagulation had been on hold prior to admission, see discussion above   Hypertension BP controlled.  -Continue Lisinopril 20mg  daily and Toprol XL 25mg  daily.   Coronary Artery Disease s/p CABG in 2008. No chest pain to suggest angina. -Continue Toprol, Lipitor, Eliquis on hold after SDH   Hyperlipidemia LDL 62 in June 2024. This is slightly above target.   -Continue Lipitor 40mg  daily.    Discharge Exam: Vitals:   05/14/23 0340 05/14/23 0930  BP: 105/68 110/70  Pulse: 63 63  Resp: 19   Temp: 98.1 F (36.7 C)   SpO2: 93%    Gen: Awake, Alert, Oriented X 3,  HEENT: no JVD Lungs: Good air movement bilaterally, CTAB CVS: S1S2/RRR Abd: soft, Non tender, non distended, BS present Extremities: No edema Skin: no new rashes on exposed skin   Discharge Instructions   Discharge Instructions     AMB Referral to Community Care Coordinaton (ACO Patients)   Complete by: As directed    Hospital Follow up referral/Care Coordination:  HF  Primary Care Provider:  Benita Stabile, MD  Insurance plan: Medicare ACO REACH  Please assign to [facility] RN Care Coordinator for post hospital care coordination for readmission prevention follow up calls new diagnosis, new medications and assess for further needs.  Questions please call:   Charlesetta Shanks, RN, BSN, CCM [place]  [facility] Liaison Direct Dial: [phone] or secure chat Website: [email or web address]     Reason for Referral: Care Coordination (ACO patients)   Disease managment services needed: Nurse Case Manager   Diagnoses of: Heart Failure   Expected date of contact: Urgent - 7 Days   Diet - low sodium heart healthy  Physician Discharge Summary  [name] [id number] DOB: Oct 07, 1945 DOA: 05/12/2023  PCP: Benita Stabile, MD  Admit date: 05/12/2023 Discharge date: 05/14/2023  Time spent: 45 minutes  Recommendations for Outpatient Follow-up:  Heart failure TOC clinic in 2 weeks Cardiology Dr. Anne Fu in 1 month Resume anticoagulation at follow-up if stable   Discharge Diagnoses:  Principal Problem: Acute on chronic diastolic CHF Paroxysmal atrial fibrillation Recent subdural hematoma Hypertension CAD, CABG Dyslipidemia  Discharge Condition: Improved  Diet recommendation: Low so DM, heart healthy  Filed Weights   05/12/23 2029 05/13/23 0544 05/14/23 0340  Weight: 62.4 kg 60.1 kg 59.1 kg    History of present illness:  77/M with history of paroxysmal atrial fibrillation, CHF with mildly reduced EF, recent subdural hematoma with unsuccessful middle meningeal artery embolization by neurosurgery 8/20, reports having chronic intermittent lower extremity edema for a long time, last week noticed increased shortness of breath with activity and orthopnea.  Presented to the ED 9/14, labs with creatinine 1.1, BNP 713  Hospital Course:   Acute on chronic systolic CHF -Last Ian 2017 with EF of 45-50% -Admitted with volume overload, diuresed with IV Lasix he is down 10 LB  -Volume status improved, weaned off O2  -Changed to Lasix 20 Mg daily and started on Jardiance, Toprol continued -mild bump in creatinine today, lisinopril discontinued  -Significant dietary indiscretion, discussed with patient wife to cut down fast food breakfast every day -Will request follow-up in heart failure TOC clinic in 2 weeks, follow-up with Dr. Anne Fu in 1 month   Paroxysmal atrial fibrillation -In sinus rhythm, continue Toprol, dofetilide,  -Anticoagulation on hold following recent subdural hematoma -Recommend resumption in 4 to 6 weeks  Recent subdural hematoma -Recently underwent unsuccessful middle meningeal  artery embolization 8/20 -Anticoagulation had been on hold prior to admission, see discussion above   Hypertension BP controlled.  -Continue Lisinopril 20mg  daily and Toprol XL 25mg  daily.   Coronary Artery Disease s/p CABG in 2008. No chest pain to suggest angina. -Continue Toprol, Lipitor, Eliquis on hold after SDH   Hyperlipidemia LDL 62 in June 2024. This is slightly above target.   -Continue Lipitor 40mg  daily.    Discharge Exam: Vitals:   05/14/23 0340 05/14/23 0930  BP: 105/68 110/70  Pulse: 63 63  Resp: 19   Temp: 98.1 F (36.7 C)   SpO2: 93%    Gen: Awake, Alert, Oriented X 3,  HEENT: no JVD Lungs: Good air movement bilaterally, CTAB CVS: S1S2/RRR Abd: soft, Non tender, non distended, BS present Extremities: No edema Skin: no new rashes on exposed skin   Discharge Instructions   Discharge Instructions     AMB Referral to Community Care Coordinaton (ACO Patients)   Complete by: As directed    Hospital Follow up referral/Care Coordination:  HF  Primary Care Provider:  Benita Stabile, MD  Insurance plan: Medicare ACO REACH  Please assign to [facility] RN Care Coordinator for post hospital care coordination for readmission prevention follow up calls new diagnosis, new medications and assess for further needs.  Questions please call:   Charlesetta Shanks, RN, BSN, CCM [place]  [facility] Liaison Direct Dial: [phone] or secure chat Website: [email or web address]     Reason for Referral: Care Coordination (ACO patients)   Disease managment services needed: Nurse Case Manager   Diagnoses of: Heart Failure   Expected date of contact: Urgent - 7 Days   Diet - low sodium heart healthy  Physician Discharge Summary  [name] XLK:[id number] DOB: Oct 07, 1945 DOA: 05/12/2023  PCP: Benita Stabile, MD  Admit date: 05/12/2023 Discharge date: 05/14/2023  Time spent: 45 minutes  Recommendations for Outpatient Follow-up:  Heart failure TOC clinic in 2 weeks Cardiology Dr. Anne Fu in 1 month Resume anticoagulation at follow-up if stable   Discharge Diagnoses:  Principal Problem: Acute on chronic diastolic CHF Paroxysmal atrial fibrillation Recent subdural hematoma Hypertension CAD, CABG Dyslipidemia  Discharge Condition: Improved  Diet recommendation: Low so DM, heart healthy  Filed Weights   05/12/23 2029 05/13/23 0544 05/14/23 0340  Weight: 62.4 kg 60.1 kg 59.1 kg    History of present illness:  77/M with history of paroxysmal atrial fibrillation, CHF with mildly reduced EF, recent subdural hematoma with unsuccessful middle meningeal artery embolization by neurosurgery 8/20, reports having chronic intermittent lower extremity edema for a long time, last week noticed increased shortness of breath with activity and orthopnea.  Presented to the ED 9/14, labs with creatinine 1.1, BNP 713  Hospital Course:   Acute on chronic systolic CHF -Last Ian 2017 with EF of 45-50% -Admitted with volume overload, diuresed with IV Lasix he is down 10 LB  -Volume status improved, weaned off O2  -Changed to Lasix 20 Mg daily and started on Jardiance, Toprol continued -mild bump in creatinine today, lisinopril discontinued  -Significant dietary indiscretion, discussed with patient wife to cut down fast food breakfast every day -Will request follow-up in heart failure TOC clinic in 2 weeks, follow-up with Dr. Anne Fu in 1 month   Paroxysmal atrial fibrillation -In sinus rhythm, continue Toprol, dofetilide,  -Anticoagulation on hold following recent subdural hematoma -Recommend resumption in 4 to 6 weeks  Recent subdural hematoma -Recently underwent unsuccessful middle meningeal  artery embolization 8/20 -Anticoagulation had been on hold prior to admission, see discussion above   Hypertension BP controlled.  -Continue Lisinopril 20mg  daily and Toprol XL 25mg  daily.   Coronary Artery Disease s/p CABG in 2008. No chest pain to suggest angina. -Continue Toprol, Lipitor, Eliquis on hold after SDH   Hyperlipidemia LDL 62 in June 2024. This is slightly above target.   -Continue Lipitor 40mg  daily.    Discharge Exam: Vitals:   05/14/23 0340 05/14/23 0930  BP: 105/68 110/70  Pulse: 63 63  Resp: 19   Temp: 98.1 F (36.7 C)   SpO2: 93%    Gen: Awake, Alert, Oriented X 3,  HEENT: no JVD Lungs: Good air movement bilaterally, CTAB CVS: S1S2/RRR Abd: soft, Non tender, non distended, BS present Extremities: No edema Skin: no new rashes on exposed skin   Discharge Instructions   Discharge Instructions     AMB Referral to Community Care Coordinaton (ACO Patients)   Complete by: As directed    Hospital Follow up referral/Care Coordination:  HF  Primary Care Provider:  Benita Stabile, MD  Insurance plan: Medicare ACO REACH  Please assign to [facility] RN Care Coordinator for post hospital care coordination for readmission prevention follow up calls new diagnosis, new medications and assess for further needs.  Questions please call:   Charlesetta Shanks, RN, BSN, CCM [place]  [facility] Liaison Direct Dial: [phone] or secure chat Website: [email or web address]     Reason for Referral: Care Coordination (ACO patients)   Disease managment services needed: Nurse Case Manager   Diagnoses of: Heart Failure   Expected date of contact: Urgent - 7 Days   Diet - low sodium heart healthy  Systemic Diam: 2.00 cm Dina Rich MD Electronically signed by Dina Rich MD Signature Date/Time: 05/13/2023/11:54:28 AM    Final    VAS [place] LOWER EXTREMITY VENOUS (DVT)  Result Date: 05/13/2023  Lower Venous DVT Study Patient Name:  [name]  Date of Exam:   05/13/2023 Medical Rec #: [id number]       Accession #:    [id number] Date of Birth: Jan 12, 1946        Patient Gender: M Patient Age:   77 years Exam Location:  [facility] Procedure:      VAS [place] LOWER EXTREMITY VENOUS (DVT) Referring Phys: [facility] GOEL --------------------------------------------------------------------------------  Indications: Edema.  Comparison Study: No prior study Performing Technologist: Sherren Kerns RVS  Examination Guidelines: A complete evaluation includes B-mode imaging, spectral Doppler, color Doppler, and power Doppler as needed of all accessible portions of each vessel. Bilateral testing is considered an integral part of a complete examination. Limited examinations for reoccurring indications may be performed as noted. The reflux portion of the exam is performed with the patient in reverse Trendelenburg.  +---------+---------------+---------+-----------+----------+--------------+ RIGHT    CompressibilityPhasicitySpontaneityPropertiesThrombus Aging  +---------+---------------+---------+-----------+----------+--------------+ CFV      Full           Yes      Yes                                 +---------+---------------+---------+-----------+----------+--------------+ SFJ      Full                                                        +---------+---------------+---------+-----------+----------+--------------+ FV Prox  Full                                                        +---------+---------------+---------+-----------+----------+--------------+ FV Mid   Full                                                        +---------+---------------+---------+-----------+----------+--------------+ FV DistalFull                                                        +---------+---------------+---------+-----------+----------+--------------+ PFV      Full                                                        +---------+---------------+---------+-----------+----------+--------------+ POP      Full           Yes      Yes                                 +---------+---------------+---------+-----------+----------+--------------+

## 2023-05-14 NOTE — Progress Notes (Signed)
Care Coordination   Note   05/14/2023 Name: [name] MRN: [id number] DOB: 08-26-1946  [name] is a 77 y.o. year old male who sees Margo Aye, Kathleene Hazel, MD for primary care. I reached out to [name] by phone today to offer care coordination services.  [name] was given information about Care Coordination services today including:   The Care Coordination services include support from the care team which includes your Nurse Coordinator, Clinical Social Worker, or Pharmacist.  The Care Coordination team is here to help remove barriers to the health concerns and goals most important to you. Care Coordination services are voluntary, and the patient may decline or stop services at any time by request to their care team member.   Care Coordination Consent Status: Patient agreed to services and verbal consent obtained.   Follow up plan:  Telephone appointment with care coordination team member scheduled for:  05/28/23  Encounter Outcome:  Patient Scheduled  Professional Hospital Coordination Care Guide  Direct Dial: [phone]

## 2023-05-14 NOTE — Progress Notes (Signed)
Heart Failure Nurse Navigator Progress Note  PCP: Benita Stabile, MD PCP-Cardiologist: Anne Fu Admission Diagnosis: Congestive heart failure Admitted from: Home  Presentation:   [name] presented with chest pain and shortness of breath , BLE edema for the last week. BNP 713, BP 196/93, HR 68, Patient was educated on the sign and symptoms of heart failure, daily weights, when to call his doctor or go to the ED, Diet/ fluid restrictions, ( reported he does eat a lot of salt) taking all medications as prescribed and attending all medical appointments. Patient verbalized his understanding, a HF TOC appointment was scheduled for 05/23/2023 @ 11 am.   ECHO/ LVEF: 55-60%  Clinical Course:  Past Medical History:  Diagnosis Date   AAA (abdominal aortic aneurysm) (HCC)    06/27/20 MRI: 3.0 cm infrarenal AAA, recommend 3 year follow-up   Anxiety    Atrial fibrillation (HCC)    Bilateral carotid artery disease (HCC) 07/21/2013   CAD (coronary artery disease) 2008   CABG   COPD (chronic obstructive pulmonary disease) (HCC)    Dysrhythmia    A-fib   Gastritis    Headache(784.0)    History of hiatal hernia    Hypertension    SDH (subdural hematoma) (HCC) 01/31/2023   Visit for monitoring Tikosyn therapy 12/2015     Social History   Socioeconomic History   Marital status: Married    Spouse name: Not on file   Number of children: 2   Years of education: Not on file   Highest education level: Not on file  Occupational History   Not on file  Tobacco Use   Smoking status: Former    Current packs/day: 0.00    Types: Cigarettes    Quit date: 1960    Years since quitting: 64.7   Smokeless tobacco: Former    Types: Chew    Quit date: 08/2022   Tobacco comments:    Former smoker 04/20/23  Vaping Use   Vaping status: Never Used  Substance and Sexual Activity   Alcohol use: No    Comment: quit drinking in 1971   Drug use: No   Sexual activity: Not on file  Other Topics Concern    Not on file  Social History Narrative   Right Handed   Decaf Coffee per Day    Social Determinants of Health   Financial Resource Strain: Not on file  Food Insecurity: No Food Insecurity (05/12/2023)   Hunger Vital Sign    Worried About Running Out of Food in the Last Year: Never true    Ran Out of Food in the Last Year: Never true  Transportation Needs: No Transportation Needs (05/12/2023)   PRAPARE - Administrator, Civil Service (Medical): No    Lack of Transportation (Non-Medical): No  Physical Activity: Not on file  Stress: No Stress Concern Present (01/31/2023)   Received from [facility], [facility] of Occupational Health - Occupational Stress Questionnaire    Feeling of Stress : Not at all  Social Connections: Unknown (01/31/2023)   Received from [facility], [facility]   Social Network    Social Network: Not on file   Education Assessment and Provision:  Detailed education and instructions provided on heart failure disease management including the following:  Signs and symptoms of Heart Failure When to call the physician Importance of daily weights Low sodium diet Fluid restriction Medication management Anticipated future follow-up appointments  Patient education given on each  of the above topics.  Patient acknowledges understanding via teach back method and acceptance of all instructions.  Education Materials:  "Living Better With Heart Failure" Booklet, HF zone tool, & Daily Weight Tracker Tool.  Patient has scale at home: yes Patient has pill box at home: yes    High Risk Criteria for Readmission and/or Poor Patient Outcomes: Heart failure hospital admissions (last 6 months): 1  No Show rate: 1 % Difficult social situation: No Demonstrates medication adherence: Yes Primary Language: english Literacy level: Reading, writing, and comprehension.   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 )   Considerations/Referrals:   Referral made to Heart Failure Pharmacist Stewardship: yes Referral made to Heart Failure CSW/NCM TOC: No Referral made to Heart & Vascular TOC clinic: Yes, 05/23/2023 @ 11 am.   Items for Follow-up on DC/TOC: Diet/ fluid restrictions Daily weights Continued HF education   Rhae Hammock, BSN, RN Heart Failure Print production planner Chat Only

## 2023-05-14 NOTE — Consult Note (Signed)
Value-Based Care Institute  [facility] Inpatient Consult   05/14/2023  [name] [birth year] [id number]  Location:  RN Hospital Liaison met patient at bedside at [facility]  Insurance: Medicare ACO REACH   The patient was screened for 30 day readmission hospitalization with noted high risk score for unplanned readmission risk with 4 Admissions in 6 months.  The patient was assessed for potential [facility]) Care Management service needs for post hospital transition for care coordination. Review of patient's electronic medical record reveals patient is for home today.   Met briefly with patient and wife and they endorses PCP and for follow up.  Plan: [facility] Liaison will continue to follow progress and disposition to asess for post hospital community care  coordination/management needs.   Referral request for community care coordination: made referral for post hospital   [facility] Care Management/Population Health does not replace or interfere with any arrangements made by the Inpatient Transition of Care team.   For questions contact:   Charlesetta Shanks, RN, BSN, CCM [place]  [facility], [facility] Liaison Direct Dial: [phone] or secure chat Website: [email or web address]

## 2023-05-14 NOTE — TOC Benefit Eligibility Note (Signed)
Patient Product/process development scientist completed.    The patient is insured through [employer]. Patient has Medicare and is not eligible for a copay card, but may be able to apply for patient assistance, if available.    Ran test claim for Jardiance 10 mg and the current 30 day co-pay is $57.60.   This test claim was processed through [facility]- copay amounts may vary at other pharmacies due to pharmacy/plan contracts, or as the patient moves through the different stages of their insurance plan.     Roland Earl, CPHT Pharmacy Technician III Certified Patient Advocate [facility] Patient Advocate Team Direct Number: [phone]  Fax: [phone]

## 2023-05-14 NOTE — TOC Initial Note (Addendum)
Transition of Care [facility]) - Initial/Assessment Note    Patient Details  Name: [name] MRN: [id number] Date of Birth: Jul 15, 1946  Transition of Care [facility]) CM/SW Contact:    Elliot Cousin, RN Phone Number: [phone] 05/14/2023, 9:38 AM  Clinical Narrative: Readmission: CM spoke to pt at bedside and states he was independent PTA. States he drives to his appts. Has RW and shower chair at home. Sister will provide transportation home today. Pt was active with [facility] in 03/2023. Contacted [facility] rep, Kandee Keen to follow up. Pt may need HH RN for Disease management.      PCP appt scheduled.       Jardiance 10 mg and the current 30 day co-pay is $57.60.        Expected Discharge Plan: Home/Self Care Barriers to Discharge: No Barriers Identified   Patient Goals and CMS Choice Patient states their goals for this hospitalization and ongoing recovery are:: wants to back to work          Expected Discharge Plan and Services   Discharge Planning Services: CM Consult   Living arrangements for the past 2 months: Single Family Home Expected Discharge Date: 05/14/23                             Date HH Agency Contacted: 05/14/23 Time HH Agency Contacted: [id number] Representative spoke with at [facility] Agency: Kandee Keen  Prior Living Arrangements/Services Living arrangements for the past 2 months: Single Family Home Lives with:: Spouse Patient language and need for interpreter reviewed:: Yes Do you feel safe going back to the place where you live?: Yes      Need for Family Participation in Patient Care: No (Comment) Care giver support system in place?: Yes (comment) Current home services: DME (RW, shower seat) Criminal Activity/Legal Involvement Pertinent to Current Situation/Hospitalization: No - Comment as needed  Activities of Daily Living Home Assistive Devices/Equipment: Eyeglasses ADL Screening (condition at time of admission) Patient's cognitive ability adequate to safely  complete daily activities?: Yes Is the patient deaf or have difficulty hearing?: No Does the patient have difficulty seeing, even when wearing glasses/contacts?: No Does the patient have difficulty concentrating, remembering, or making decisions?: No Patient able to express need for assistance with ADLs?: Yes Does the patient have difficulty dressing or bathing?: No Independently performs ADLs?: Yes (appropriate for developmental age) Does the patient have difficulty walking or climbing stairs?: No Weakness of Legs: None Weakness of Arms/Hands: None  Permission Sought/Granted Permission sought to share information with : Case Manager, Family Supports, PCP Permission granted to share information with : Yes, Verbal Permission Granted  Share Information with NAME: Brigham Widman     Permission granted to share info w Relationship: wife  Permission granted to share info w Contact Information: [phone]  Emotional Assessment Appearance:: Appears stated age Attitude/Demeanor/Rapport: Engaged Affect (typically observed): Accepting Orientation: : Oriented to Self, Oriented to Place, Oriented to  Time, Oriented to Situation   Psych Involvement: No (comment)  Admission diagnosis:  CHF (congestive heart failure) (HCC) [I50.9] Congestive heart failure, unspecified HF chronicity, unspecified heart failure type [facility]) [I50.9] Patient Active Problem List   Diagnosis Date Noted   Fluid overload 05/12/2023   CHF (congestive heart failure) (HCC) 05/12/2023   Heart failure with mildly reduced ejection fraction (HFmrEF) (HCC) 04/24/2023   Lower extremity edema 04/24/2023   Cerebral aneurysm 04/17/2023   COVID-19 04/03/2023   Right sided weakness 04/03/2023  Abnormal gait 03/27/2023   Subdural hematoma (HCC) 03/26/2023   Head injury 02/13/2023   Fall 02/13/2023   Subdural hematoma due to concussion (HCC) 02/13/2023   Paroxysmal atrial fibrillation (HCC) 09/15/2021   Hypercoagulable state due to  paroxysmal atrial fibrillation (HCC) 09/15/2021   AAA (abdominal aortic aneurysm) without rupture (HCC) 09/15/2021   History of bradycardia 10/21/2020   Spinal stenosis at L4-L5 level 08/09/2020   Insomnia 02/06/2018   Visit for monitoring Tikosyn therapy 01/10/2016   Atrial flutter (HCC) 08/07/2015   Bilateral carotid artery disease (HCC) 07/21/2013   CAD (coronary artery disease) 01/26/2013   Chronic obstructive pulmonary disease (HCC) 01/26/2013   Essential hypertension, benign 01/26/2013   Hyperlipidemia 01/26/2013   PCP:  Benita Stabile, MD Pharmacy:   [facility] [phone] - [place],  - [address] [place] [address] Phone: [phone] Fax: [phone]  [facility] [address] [place] [address] Phone: [phone] Fax: [phone]     Social Determinants of Health (SDOH) Social History: SDOH Screenings   Food Insecurity: No Food Insecurity (05/12/2023)  Housing: Low Risk  (05/12/2023)  Transportation Needs: No Transportation Needs (05/12/2023)  Utilities: Not At Risk (05/12/2023)  Depression (PHQ2-9): Low Risk  (01/20/2020)  Social Connections: Unknown (01/31/2023)   Received from [facility], [facility]  Stress: No Stress Concern Present (01/31/2023)   Received from [facility], [facility]  Tobacco Use: Medium Risk (05/12/2023)   SDOH Interventions:     Readmission Risk Interventions     No data to display

## 2023-05-23 ENCOUNTER — Ambulatory Visit (HOSPITAL_COMMUNITY)
Admission: RE | Admit: 2023-05-23 | Discharge: 2023-05-23 | Disposition: A | Discharge status: Home / Self Care | Payer: Medicare Other | Source: Ambulatory Visit | Attending: Physician Assistant | Admitting: Physician Assistant

## 2023-05-23 ENCOUNTER — Encounter (HOSPITAL_COMMUNITY): Payer: Self-pay

## 2023-05-23 VITALS — BP 148/88 | HR 83 | Wt 137.2 lb

## 2023-05-23 DIAGNOSIS — I480 Paroxysmal atrial fibrillation: Secondary | ICD-10-CM | POA: Diagnosis not present

## 2023-05-23 DIAGNOSIS — I4892 Unspecified atrial flutter: Secondary | ICD-10-CM | POA: Diagnosis not present

## 2023-05-23 DIAGNOSIS — I5040 Unspecified combined systolic (congestive) and diastolic (congestive) heart failure: Secondary | ICD-10-CM | POA: Insufficient documentation

## 2023-05-23 DIAGNOSIS — Z7984 Long term (current) use of oral hypoglycemic drugs: Secondary | ICD-10-CM | POA: Diagnosis not present

## 2023-05-23 DIAGNOSIS — Z79899 Other long term (current) drug therapy: Secondary | ICD-10-CM | POA: Insufficient documentation

## 2023-05-23 DIAGNOSIS — I5032 Chronic diastolic (congestive) heart failure: Secondary | ICD-10-CM

## 2023-05-23 DIAGNOSIS — I2510 Atherosclerotic heart disease of native coronary artery without angina pectoris: Secondary | ICD-10-CM | POA: Insufficient documentation

## 2023-05-23 DIAGNOSIS — S065XAA Traumatic subdural hemorrhage with loss of consciousness status unknown, initial encounter: Secondary | ICD-10-CM | POA: Diagnosis not present

## 2023-05-23 DIAGNOSIS — Z951 Presence of aortocoronary bypass graft: Secondary | ICD-10-CM | POA: Diagnosis not present

## 2023-05-23 DIAGNOSIS — X58XXXA Exposure to other specified factors, initial encounter: Secondary | ICD-10-CM | POA: Diagnosis not present

## 2023-05-23 DIAGNOSIS — I10 Essential (primary) hypertension: Secondary | ICD-10-CM | POA: Diagnosis not present

## 2023-05-23 DIAGNOSIS — I110 Hypertensive heart disease with heart failure: Secondary | ICD-10-CM | POA: Insufficient documentation

## 2023-05-23 LAB — BASIC METABOLIC PANEL
Anion gap: 7 (ref 5–15)
BUN: 16 mg/dL (ref 8–23)
CO2: 26 mmol/L (ref 22–32)
Calcium: 9.2 mg/dL (ref 8.9–10.3)
Chloride: 107 mmol/L (ref 98–111)
Creatinine, Ser: 1.31 mg/dL — ABNORMAL HIGH (ref 0.61–1.24)
GFR, Estimated: 55 mL/min — ABNORMAL LOW (ref >60)
Glucose, Bld: 91 mg/dL (ref 70–99)
Potassium: 4.9 mmol/L (ref 3.5–5.1)
Sodium: 140 mmol/L (ref 135–145)

## 2023-05-23 LAB — BRAIN NATRIURETIC PEPTIDE: B Natriuretic Peptide: 557.5 pg/mL — ABNORMAL HIGH (ref 0.0–100.0)

## 2023-05-23 NOTE — Patient Instructions (Signed)
Labs done today. We will contact you only if your labs are abnormal.  No medication changes were made. Please continue all current medications as prescribed.  Thank you for allowing [place] to provider your heart failure care after your recent hospitalization. Please follow-up with General Cardiology in 4 weeks

## 2023-05-23 NOTE — Progress Notes (Addendum)
HEART & VASCULAR TRANSITION OF CARE CONSULT NOTE     Referring Physician: Dr. Jomarie Longs Primary Care: Dr. Margo Aye Primary Cardiologist: Dr. Anne Fu  HPI: Referred to clinic by Dr. Jomarie Longs with [facility] for heart failure consultation. 77 y.o. male with history of PAF/AFL on dofetilide, HFmrEF, CAD s/p CABG in 2008, HTN, SDH s/p burr hole evacuation in 07/24 off anticoagulation, COPD, COVID-19 infection 08/24.  Echo 2017 in [facility]: EF 45-50%,   Had an unsuccessful attempt at middle meningeal artery embolization in 08/24 for residual subdural hematoma after evacuation in 07/24.  Has referral to EP pending for consideration of left atrial appendage occlusion.  Admitted 09/14-09/16/24 with acute on chronic CHF. Hypertensive on presentation. Had been eating fast food breakfast most days. Diuresed with IV lasix and Jardiance added to regimen. Lisinopril stopped d/t slight bump in Scr. Echo 05/13/23: EF 55-60%, RV low normal, moderate LAE  He is here today for hospital follow-up.  He has been feeling well. No dyspnea, orthopnea, PND or lower extremity edema. His weight has been stable around 134-135 lb. He has cut back sodium intake. No longer using a salt shaker. Still eats out for breakfast but avoiding processed meats. He is not drinking too much fluid. Taking all medications as prescribed. Home BP often 130s and can range up to 170s.  He is an Radiographer, therapeutic farmers. Continues to work full-time.   Past Medical History:  Diagnosis Date   AAA (abdominal aortic aneurysm) (HCC)    06/27/20 MRI: 3.0 cm infrarenal AAA, recommend 3 year follow-up   Anxiety    Atrial fibrillation (HCC)    Bilateral carotid artery disease (HCC) 07/21/2013   CAD (coronary artery disease) 2008   CABG   COPD (chronic obstructive pulmonary disease) (HCC)    Dysrhythmia    A-fib   Gastritis    Headache(784.0)    History of hiatal hernia    Hypertension    SDH (subdural hematoma) (HCC) 01/31/2023   Visit  for monitoring Tikosyn therapy 12/2015    Current Outpatient Medications  Medication Sig Dispense Refill   acetaminophen (TYLENOL) 500 MG tablet Take 1 tablet (500 mg total) by mouth every 6 (six) hours for 8 days then as directed by MD 100 tablet 0   ALPRAZolam (XANAX) 0.25 MG tablet TAKE 1 TABLET(0.25 MG) BY MOUTH DAILY (Patient taking differently: Take 0.25 mg by mouth at bedtime.) 10 tablet 0   ascorbic acid (VITAMIN C) 500 MG tablet Take 500 mg by mouth at bedtime.     atorvastatin (LIPITOR) 40 MG tablet TAKE 1 TABLET(40 MG) BY MOUTH DAILY (Patient taking differently: Take 40 mg by mouth at bedtime.) 90 tablet 1   diltiazem (CARDIZEM) 30 MG tablet Take 1 tablet every 4 hours AS NEEDED for heart rate >100 as long as blood pressure >100. 45 tablet 1   dofetilide (TIKOSYN) 250 MCG capsule Take 1 capsule (250 mcg total) by mouth 2 (two) times daily. 60 capsule 6   empagliflozin (JARDIANCE) 10 MG TABS tablet Take 1 tablet (10 mg total) by mouth daily. 30 tablet 1   furosemide (LASIX) 20 MG tablet Take 1 tablet (20 mg total) by mouth daily. 30 tablet 1   loperamide (IMODIUM A-D) 2 MG tablet Take 2 mg by mouth 4 (four) times daily as needed for diarrhea or loose stools.     metoprolol succinate (TOPROL XL) 25 MG 24 hr tablet Take 1 tablet (25 mg total) by mouth 2 (two) times daily.  Multiple Vitamins-Minerals (CENTRUM SILVER PO) Take 1 tablet by mouth at bedtime.     nitroGLYCERIN (NITROSTAT) 0.4 MG SL tablet Place 1 tablet (0.4 mg total) under the tongue every 5 (five) minutes as needed for chest pain. 10 tablet 3   Omega-3 Fatty Acids (FISH OIL) 1000 MG CAPS Take 1,000 mg by mouth at bedtime.     potassium chloride SA (KLOR-CON M) 20 MEQ tablet Take 1 tablet (20 mEq total) by mouth daily. 30 tablet 1   No current facility-administered medications for this encounter.    Allergies  Allergen Reactions   Vibramycin [Doxycycline] Hives and Shortness Of Breath    Increased urinary frequency    Asa [Aspirin] Hypertension    Told to avoid due to heart condition   Nsaids Hypertension    Told to avoid due to heart condition      Social History   Socioeconomic History   Marital status: Married    Spouse name: Not on file   Number of children: 2   Years of education: Not on file   Highest education level: Not on file  Occupational History   Not on file  Tobacco Use   Smoking status: Former    Current packs/day: 0.00    Types: Cigarettes    Quit date: 1960    Years since quitting: 64.7   Smokeless tobacco: Former    Types: Chew    Quit date: 08/2022   Tobacco comments:    Former smoker 04/20/23  Vaping Use   Vaping status: Never Used  Substance and Sexual Activity   Alcohol use: No    Comment: quit drinking in 1971   Drug use: No   Sexual activity: Not on file  Other Topics Concern   Not on file  Social History Narrative   Right Handed   Decaf Coffee per Day    Social Determinants of Health   Financial Resource Strain: Not on file  Food Insecurity: No Food Insecurity (05/12/2023)   Hunger Vital Sign    Worried About Running Out of Food in the Last Year: Never true    Ran Out of Food in the Last Year: Never true  Transportation Needs: No Transportation Needs (05/12/2023)   PRAPARE - Administrator, Civil Service (Medical): No    Lack of Transportation (Non-Medical): No  Physical Activity: Not on file  Stress: No Stress Concern Present (01/31/2023)   Received from [facility], [facility] - Occupational Stress Questionnaire    Feeling of Stress : Not at all  Social Connections: Unknown (01/31/2023)   Received from [facility], [facility]   Social Network    Social Network: Not on file  Intimate Partner Violence: Not At Risk (05/12/2023)   Humiliation, Afraid, Rape, and Kick questionnaire    Fear of Current or Ex-Partner: No    Emotionally Abused: No    Physically Abused: No    Sexually Abused:  No      Family History  Problem Relation Age of Onset   Diabetes Mother    Hypertension Mother    Heart attack Father    Sudden death Father    Hypertension Sister    Diabetes Sister     Vitals:   05/23/23 1110  BP: (!) 148/88  Pulse: 83  SpO2: 97%  Weight: 62.2 kg (137 lb 3.2 oz)    PHYSICAL EXAM: General:  Well appearing elderly male HEENT: poor dentition Neck: supple.  no JVD.  Cor: PMI nondisplaced. Regular rate & rhythm. No rubs, gallops or murmurs. Lungs: clear Abdomen: soft, nontender, nondistended.  Extremities: no cyanosis, clubbing, rash, edema Neuro: alert & oriented x 3. Affect pleasant.  ECG: Sinus bradycardia 57 bpm, QT 461 ms   ASSESSMENT & PLAN: HFmrEF>>HFpEF -Echo 12/2015: EF 45-50%. In setting of atrial flutter. -Echo 09/24: EF 55-60%, RV low normal -NYHA II. Volume stable. Continue 20 mg Furosemide daily. Rein -Continue Jardiance 10 mg daily -On metoprolol xl 25 mg BID -Lisinopril stopped during recent admit d/t mild AKI. BP elevated. Will check labs today. If Scr is stable, will plan to start Losartan. -Reinforced limiting sodium intake  2. PAF/AFL -Sinus bradycardia today -On dofetilide 250 BID per Afib clinic. Also on metoprolol xl and PRN diltiazem. -Off anticoagulation d/t recent subdural hematoma. Plans to see Dr. Lalla Brothers for consideration of LAAO.  3. Subdural hematoma -s/p burr hole evacuation in July 2024. Unsuccessful meningeal artery embolization in 08/24. -Follows with Neurosurgery  4. HTN -BP above goal after lisinopril recently discontinued -Plan as above  5. CAD -Hx CABG in 2008 -On Atorvastatin 40 mg daily. Managed by Cardiology -Not on aspirin for now. -No angina   Referred to HFSW (PCP, Medications, Transportation, ETOH Abuse, Drug Abuse, Insurance, Financial ): No Refer to Pharmacy: No Refer to Home Health: No Refer to Advanced Heart Failure Clinic: No  Refer to General Cardiology: Yes  Follow up  PRN,  Cardiology in about 4 weeks to assess volume status

## 2023-05-24 ENCOUNTER — Telehealth (HOSPITAL_COMMUNITY): Payer: Self-pay

## 2023-05-24 DIAGNOSIS — R944 Abnormal results of kidney function studies: Secondary | ICD-10-CM | POA: Diagnosis not present

## 2023-05-24 DIAGNOSIS — D649 Anemia, unspecified: Secondary | ICD-10-CM | POA: Diagnosis not present

## 2023-05-24 DIAGNOSIS — E782 Mixed hyperlipidemia: Secondary | ICD-10-CM | POA: Diagnosis not present

## 2023-05-24 DIAGNOSIS — E1165 Type 2 diabetes mellitus with hyperglycemia: Secondary | ICD-10-CM | POA: Diagnosis not present

## 2023-05-24 DIAGNOSIS — F411 Generalized anxiety disorder: Secondary | ICD-10-CM | POA: Diagnosis not present

## 2023-05-24 DIAGNOSIS — I480 Paroxysmal atrial fibrillation: Secondary | ICD-10-CM | POA: Diagnosis not present

## 2023-05-24 DIAGNOSIS — G4700 Insomnia, unspecified: Secondary | ICD-10-CM | POA: Diagnosis not present

## 2023-05-24 DIAGNOSIS — I110 Hypertensive heart disease with heart failure: Secondary | ICD-10-CM | POA: Diagnosis not present

## 2023-05-24 DIAGNOSIS — S065X0D Traumatic subdural hemorrhage without loss of consciousness, subsequent encounter: Secondary | ICD-10-CM | POA: Diagnosis not present

## 2023-05-24 DIAGNOSIS — I25810 Atherosclerosis of coronary artery bypass graft(s) without angina pectoris: Secondary | ICD-10-CM | POA: Diagnosis not present

## 2023-05-24 DIAGNOSIS — I5023 Acute on chronic systolic (congestive) heart failure: Secondary | ICD-10-CM | POA: Diagnosis not present

## 2023-05-24 DIAGNOSIS — I5032 Chronic diastolic (congestive) heart failure: Secondary | ICD-10-CM

## 2023-05-24 MED ORDER — LOSARTAN POTASSIUM 25 MG PO TABS: 25.0000 mg | ORAL_TABLET | Freq: Every day | ORAL | 11 refills | Status: DC

## 2023-05-24 NOTE — Telephone Encounter (Signed)
-----   Message from [facility] [place] [facility] sent at 05/23/2023  1:44 PM EDT ----- Potassium higher end of normal but sample hemolyzed. No history of hyperkalemia. Start Losartan 25 mg daily. Needs BMET in 1 week.

## 2023-05-24 NOTE — Telephone Encounter (Signed)
Patients labs has been ordered and appointment scheduled. Losartan medication has been sent to his pharmacy and updated in pt's chart.

## 2023-05-25 ENCOUNTER — Ambulatory Visit (HOSPITAL_COMMUNITY)
Admission: RE | Admit: 2023-05-25 | Discharge: 2023-05-25 | Disposition: A | Discharge status: Home / Self Care | Payer: Medicare Other | Source: Ambulatory Visit | Attending: Physician Assistant | Admitting: Physician Assistant

## 2023-05-25 DIAGNOSIS — S065XAA Traumatic subdural hemorrhage with loss of consciousness status unknown, initial encounter: Secondary | ICD-10-CM | POA: Insufficient documentation

## 2023-05-25 DIAGNOSIS — I6203 Nontraumatic chronic subdural hemorrhage: Secondary | ICD-10-CM | POA: Diagnosis not present

## 2023-05-25 DIAGNOSIS — I6782 Cerebral ischemia: Secondary | ICD-10-CM | POA: Diagnosis not present

## 2023-05-28 ENCOUNTER — Ambulatory Visit: Payer: Self-pay | Admitting: *Deleted

## 2023-05-28 NOTE — Patient Outreach (Signed)
Care Coordination   Initial Visit Note   05/28/2023 Name: [name] MRN: [id number] DOB: [birth year]  [name] is a 77 y.o. year old male who sees [name], Ian Hazel, MD for primary care. I spoke with  [name] by phone today.  What matters to the patients health and wellness today?  "I am doing as about as good as I can be" per [name] when he discusses his extended hospital stay and medical history He voices concerns about bills, frequent medical visits to multiple doctors and various changes in his prescriptions during the office visits.  He discussed filing for supplemental security income (SSI) benefits late and the penalty he has been paying since age 1 He confirms he has a prescription drug coverage with wellcare He agrees to have his wellcare coverage scanned at his next MD visit He has provided only to his pharmacy   Goals Addressed             This Visit's Progress    manage my health and prescription coverage - care coordination       Interventions Today    Flowsheet Row Most Recent Value  Chronic Disease   Chronic disease during today's visit Congestive Heart Failure (CHF), Atrial Fibrillation (AFib), Other  General Interventions   General Interventions Discussed/Reviewed General Interventions Discussed, Community Resources, Doctor Visits  Doctor Visits Discussed/Reviewed Doctor Visits Discussed, PCP, Specialist  PCP/Specialist Visits Compliance with follow-up visit  [Confirmed he is concerned with having multiple MDs and office visits with frequent changing of his medicines (expensive)]  Exercise Interventions   Exercise Discussed/Reviewed Exercise Discussed, Physical Activity, Weight Managment  Physical Activity Discussed/Reviewed Physical Activity Discussed  [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]  Weight Management Weight maintenance  Education Interventions   Education Provided Provided Education  Provided  Actuary, Medication, Programmer, applications, Development worker, community  Mental Health Interventions   Mental Health Discussed/Reviewed Mental Health Discussed, Coping Strategies  Pharmacy Interventions   Pharmacy Dicussed/Reviewed Pharmacy Topics Discussed, Medications and their functions, Affording Medications  Safety Interventions   Safety Discussed/Reviewed Safety Discussed, Fall Risk              SDOH assessments and interventions completed:  Yes  SDOH Interventions Today    Flowsheet Row Most Recent Value  SDOH Interventions   Food Insecurity Interventions Intervention Not Indicated  Transportation Interventions Intervention Not Indicated  Financial Strain Interventions Intervention Not Indicated  Stress Interventions Intervention Not Indicated  Health Literacy Interventions Intervention Not Indicated        Care Coordination Interventions:  Yes, provided   Follow up plan: Follow up call scheduled for 06/15/23    Encounter Outcome:  Patient Visit Completed   Ian Bradford L. [name], RN, BSN, CCM, Care Management Coordinator [phone]

## 2023-05-28 NOTE — Patient Instructions (Addendum)
Visit Information  Thank you for taking time to visit with me today. Please don't hesitate to contact me if I can be of assistance to you.   Following are the goals we discussed today:   Goals Addressed             This Visit's Progress    manage my health and prescription coverage - care coordination       Interventions Today    Flowsheet Row Most Recent Value  Chronic Disease   Chronic disease during today's visit Congestive Heart Failure (CHF), Atrial Fibrillation (AFib), Other  General Interventions   General Interventions Discussed/Reviewed General Interventions Discussed, Community Resources, Doctor Visits  Doctor Visits Discussed/Reviewed Doctor Visits Discussed, PCP, Specialist  PCP/Specialist Visits Compliance with follow-up visit  [Confirmed he is concerned with having multiple MDs and office visits with frequent changing of his medicines (expensive)]  Exercise Interventions   Exercise Discussed/Reviewed Exercise Discussed, Physical Activity, Weight Managment  Physical Activity Discussed/Reviewed Physical Activity Discussed  [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]  Weight Management Weight maintenance  Education Interventions   Education Provided Provided Education  Provided Actuary, Medication, [employer], Development worker, community  Mental Health Interventions   Mental Health Discussed/Reviewed Mental Health Discussed, Coping Strategies  Pharmacy Interventions   Pharmacy Dicussed/Reviewed Pharmacy Topics Discussed, Medications and their functions, Affording Medications  Safety Interventions   Safety Discussed/Reviewed Safety Discussed, Fall Risk              Our next appointment is by telephone on 06/15/23 at 0900  Please call the care guide team at [phone] if you need to cancel or reschedule your appointment.   If you are experiencing a Mental Health or Behavioral Health Crisis or  need someone to talk to, please call the Suicide and Crisis Lifeline: 988 call the [place] National Suicide Prevention Lifeline: 423-426-6332 or TTY: 408-435-9897 TTY 425-559-5256) to talk to a trained counselor call 1-800-273-TALK (toll free, 24 hour hotline) call the [facility]: 772-151-5752 call 911   No computer access, no preference for copy of AVS     The patient has been provided with contact information for the care management team and has been advised to call with any health related questions or concerns.   Jackelin Correia L. [name], RN, BSN, CCM, Care Management Coordinator [phone]

## 2023-05-30 ENCOUNTER — Other Ambulatory Visit (HOSPITAL_COMMUNITY): Payer: Self-pay

## 2023-05-31 DIAGNOSIS — S065XAA Traumatic subdural hemorrhage with loss of consciousness status unknown, initial encounter: Secondary | ICD-10-CM | POA: Diagnosis not present

## 2023-06-01 ENCOUNTER — Ambulatory Visit (HOSPITAL_COMMUNITY)
Admission: RE | Admit: 2023-06-01 | Discharge: 2023-06-01 | Disposition: A | Discharge status: Home / Self Care | Payer: Medicare Other | Source: Ambulatory Visit | Attending: Internal Medicine | Admitting: Internal Medicine

## 2023-06-01 DIAGNOSIS — I5032 Chronic diastolic (congestive) heart failure: Secondary | ICD-10-CM | POA: Diagnosis not present

## 2023-06-01 LAB — BASIC METABOLIC PANEL
Anion gap: 10 (ref 5–15)
BUN: 18 mg/dL (ref 8–23)
CO2: 23 mmol/L (ref 22–32)
Calcium: 9.1 mg/dL (ref 8.9–10.3)
Chloride: 101 mmol/L (ref 98–111)
Creatinine, Ser: 1.45 mg/dL — ABNORMAL HIGH (ref 0.61–1.24)
GFR, Estimated: 49 mL/min — ABNORMAL LOW (ref >60)
Glucose, Bld: 103 mg/dL — ABNORMAL HIGH (ref 70–99)
Potassium: 4.2 mmol/L (ref 3.5–5.1)
Sodium: 134 mmol/L — ABNORMAL LOW (ref 135–145)

## 2023-06-04 ENCOUNTER — Other Ambulatory Visit (HOSPITAL_COMMUNITY): Payer: Self-pay

## 2023-06-05 ENCOUNTER — Other Ambulatory Visit (HOSPITAL_COMMUNITY): Payer: Self-pay

## 2023-06-05 ENCOUNTER — Other Ambulatory Visit (HOSPITAL_COMMUNITY): Payer: Self-pay | Admitting: Cardiology

## 2023-06-05 MED ORDER — METOPROLOL SUCCINATE ER 25 MG PO TB24: 25.0000 mg | ORAL_TABLET | Freq: Two times a day (BID) | ORAL | 11 refills | Status: DC

## 2023-06-05 NOTE — Telephone Encounter (Signed)
Pt wife request refill of metoprolol

## 2023-06-07 DIAGNOSIS — G4700 Insomnia, unspecified: Secondary | ICD-10-CM | POA: Diagnosis not present

## 2023-06-07 DIAGNOSIS — I110 Hypertensive heart disease with heart failure: Secondary | ICD-10-CM | POA: Diagnosis not present

## 2023-06-07 DIAGNOSIS — S065X0D Traumatic subdural hemorrhage without loss of consciousness, subsequent encounter: Secondary | ICD-10-CM | POA: Diagnosis not present

## 2023-06-07 DIAGNOSIS — E782 Mixed hyperlipidemia: Secondary | ICD-10-CM | POA: Diagnosis not present

## 2023-06-07 DIAGNOSIS — F411 Generalized anxiety disorder: Secondary | ICD-10-CM | POA: Diagnosis not present

## 2023-06-07 DIAGNOSIS — I25810 Atherosclerosis of coronary artery bypass graft(s) without angina pectoris: Secondary | ICD-10-CM | POA: Diagnosis not present

## 2023-06-07 DIAGNOSIS — E1169 Type 2 diabetes mellitus with other specified complication: Secondary | ICD-10-CM | POA: Diagnosis not present

## 2023-06-07 DIAGNOSIS — I480 Paroxysmal atrial fibrillation: Secondary | ICD-10-CM | POA: Diagnosis not present

## 2023-06-07 DIAGNOSIS — I5023 Acute on chronic systolic (congestive) heart failure: Secondary | ICD-10-CM | POA: Diagnosis not present

## 2023-06-07 DIAGNOSIS — R944 Abnormal results of kidney function studies: Secondary | ICD-10-CM | POA: Diagnosis not present

## 2023-06-07 DIAGNOSIS — D649 Anemia, unspecified: Secondary | ICD-10-CM | POA: Diagnosis not present

## 2023-06-14 ENCOUNTER — Ambulatory Visit: Payer: Medicare Other | Attending: Nurse Practitioner | Admitting: Nurse Practitioner

## 2023-06-14 ENCOUNTER — Encounter: Payer: Self-pay | Admitting: Nurse Practitioner

## 2023-06-14 VITALS — BP 124/76 | Ht 65.0 in | Wt 143.4 lb

## 2023-06-14 DIAGNOSIS — I480 Paroxysmal atrial fibrillation: Secondary | ICD-10-CM | POA: Diagnosis not present

## 2023-06-14 DIAGNOSIS — I6523 Occlusion and stenosis of bilateral carotid arteries: Secondary | ICD-10-CM | POA: Diagnosis not present

## 2023-06-14 DIAGNOSIS — E785 Hyperlipidemia, unspecified: Secondary | ICD-10-CM | POA: Diagnosis not present

## 2023-06-14 DIAGNOSIS — S065XAA Traumatic subdural hemorrhage with loss of consciousness status unknown, initial encounter: Secondary | ICD-10-CM | POA: Diagnosis not present

## 2023-06-14 DIAGNOSIS — I714 Abdominal aortic aneurysm, without rupture, unspecified: Secondary | ICD-10-CM | POA: Insufficient documentation

## 2023-06-14 DIAGNOSIS — I2510 Atherosclerotic heart disease of native coronary artery without angina pectoris: Secondary | ICD-10-CM | POA: Diagnosis not present

## 2023-06-14 DIAGNOSIS — I10 Essential (primary) hypertension: Secondary | ICD-10-CM | POA: Insufficient documentation

## 2023-06-14 DIAGNOSIS — I4892 Unspecified atrial flutter: Secondary | ICD-10-CM | POA: Insufficient documentation

## 2023-06-14 DIAGNOSIS — I5022 Chronic systolic (congestive) heart failure: Secondary | ICD-10-CM | POA: Insufficient documentation

## 2023-06-14 DIAGNOSIS — S065XAD Traumatic subdural hemorrhage with loss of consciousness status unknown, subsequent encounter: Secondary | ICD-10-CM | POA: Diagnosis not present

## 2023-06-14 NOTE — Progress Notes (Signed)
165.1 cm Weight:     68.6 kg BSA:        1.79 m^2 Pt. Status: Room:  ATTENDING    Kristeen Miss, M.D. PERFORMING   Kristeen Miss, M.D. Asencion Partridge, Joyce Copa SONOGRAPHER  [facility] ADMITTING    Nahser, Jr  cc:  ------------------------------------------------------------------- LV EF: 35% -   40%  ------------------------------------------------------------------- Indications:      Atrial fibrillation -  427.31.  ------------------------------------------------------------------- History:   PMH:   Atrial flutter.  Atrial flutter.  Coronary artery disease.  Chronic obstructive pulmonary disease.  Risk factors: Hypertension. Dyslipidemia.  ------------------------------------------------------------------- Study Conclusions  - Left ventricle: Systolic function was moderately reduced. The estimated ejection fraction was in the range of 35% to 40%. - Aortic valve: No evidence of vegetation. - Left atrium: No evidence of thrombus in the atrial cavity or appendage.  Impressions:  - This was done as the initial evaluation of a TEE / Cardioversion. The subsequent cardioversion was successful The patient complained of blurry vision for several minutes after waking up from the procedure His neuro exam was intact at that time. his blurry vision resolved completely after about 30 minutes. no complicatons  Diagnostic transesophageal echocardiography.  2D and color Doppler. Birthdate:  Patient birthdate: April 18, 1946.  Age:  Patient is 77 yr old.  Sex:  Gender: male.    BMI: 25.2 kg/m^2.  Blood pressure: 97/57  Patient status:  Inpatient.  Study date:  Study date: 09/08/2015. Study time: 01:58 PM.  Location:  Endoscopy.  -------------------------------------------------------------------  ------------------------------------------------------------------- Left ventricle:  Systolic function was moderately reduced. The estimated ejection fraction was in the range of 35% to 40%.  ------------------------------------------------------------------- Aortic valve:   Structurally normal valve.   Cusp separation was normal.  No evidence of vegetation.  Doppler:  There was no regurgitation.  ------------------------------------------------------------------- Aorta:  The aorta was normal, not dilated, and non-diseased.  ------------------------------------------------------------------- Left  atrium:  WE used Definity contrast to further evaluate the Left atrial appendage. There was no evidence of LAA thrombi using Definity .  No evidence of thrombus in the atrial cavity or appendage.  ------------------------------------------------------------------- Post procedure conclusions Ascending Aorta:  - The aorta was normal, not dilated, and non-diseased.  ------------------------------------------------------------------- Prepared and Electronically Authenticated by  Kristeen Miss, M.D. 2017-01-11T18:46:27           Recent Labs: 05/13/2023: ALT 39; Hemoglobin 12.7; Platelets 224 05/14/2023: Magnesium 2.1 05/23/2023: B Natriuretic Peptide 557.5 06/01/2023: BUN 18; Creatinine, Ser 1.45; Potassium 4.2; Sodium 134  Recent Lipid Panel    Component Value Date/Time   CHOL 141 01/12/2020 0838   TRIG 71 01/12/2020 0838   HDL 57 01/12/2020 0838   CHOLHDL 2.5 01/12/2020 0838   CHOLHDL 2.6 08/03/2014 0822   VLDL 10 08/03/2014 0822   LDLCALC 70 01/12/2020 0838    History of Present Illness    77 year old male with the above past medical history including CAD s/p CABG x5 (LIMA-LAD, SVG-DIAG, SVG-PDA, SVG-OM1-OM2) in 2008, chronic heart failure with midrange EF, paroxysmal atrial fibrillation/atrial flutter on Tikosyn, bilateral carotid artery stenosis, AAA, hypertension, hyperlipidemia, SDH s/p burr hole evacuation in 02/2023, and COPD.  He has a history of CAD as above.  Also has a history of chronic heart failure with midrange EF (previously 45 to 50%).  Most recent echocardiogram in 04/2023 showed EF improved to 55 to 60%, normal LV, LVH, low normal systolic function, mildly  elevated PASP, no significant valvular abnormalities. He has  165.1 cm Weight:     68.6 kg BSA:        1.79 m^2 Pt. Status: Room:  ATTENDING    Kristeen Miss, M.D. PERFORMING   Kristeen Miss, M.D. Asencion Partridge, Joyce Copa SONOGRAPHER  [facility] ADMITTING    Nahser, Jr  cc:  ------------------------------------------------------------------- LV EF: 35% -   40%  ------------------------------------------------------------------- Indications:      Atrial fibrillation -  427.31.  ------------------------------------------------------------------- History:   PMH:   Atrial flutter.  Atrial flutter.  Coronary artery disease.  Chronic obstructive pulmonary disease.  Risk factors: Hypertension. Dyslipidemia.  ------------------------------------------------------------------- Study Conclusions  - Left ventricle: Systolic function was moderately reduced. The estimated ejection fraction was in the range of 35% to 40%. - Aortic valve: No evidence of vegetation. - Left atrium: No evidence of thrombus in the atrial cavity or appendage.  Impressions:  - This was done as the initial evaluation of a TEE / Cardioversion. The subsequent cardioversion was successful The patient complained of blurry vision for several minutes after waking up from the procedure His neuro exam was intact at that time. his blurry vision resolved completely after about 30 minutes. no complicatons  Diagnostic transesophageal echocardiography.  2D and color Doppler. Birthdate:  Patient birthdate: April 18, 1946.  Age:  Patient is 77 yr old.  Sex:  Gender: male.    BMI: 25.2 kg/m^2.  Blood pressure: 97/57  Patient status:  Inpatient.  Study date:  Study date: 09/08/2015. Study time: 01:58 PM.  Location:  Endoscopy.  -------------------------------------------------------------------  ------------------------------------------------------------------- Left ventricle:  Systolic function was moderately reduced. The estimated ejection fraction was in the range of 35% to 40%.  ------------------------------------------------------------------- Aortic valve:   Structurally normal valve.   Cusp separation was normal.  No evidence of vegetation.  Doppler:  There was no regurgitation.  ------------------------------------------------------------------- Aorta:  The aorta was normal, not dilated, and non-diseased.  ------------------------------------------------------------------- Left  atrium:  WE used Definity contrast to further evaluate the Left atrial appendage. There was no evidence of LAA thrombi using Definity .  No evidence of thrombus in the atrial cavity or appendage.  ------------------------------------------------------------------- Post procedure conclusions Ascending Aorta:  - The aorta was normal, not dilated, and non-diseased.  ------------------------------------------------------------------- Prepared and Electronically Authenticated by  Kristeen Miss, M.D. 2017-01-11T18:46:27           Recent Labs: 05/13/2023: ALT 39; Hemoglobin 12.7; Platelets 224 05/14/2023: Magnesium 2.1 05/23/2023: B Natriuretic Peptide 557.5 06/01/2023: BUN 18; Creatinine, Ser 1.45; Potassium 4.2; Sodium 134  Recent Lipid Panel    Component Value Date/Time   CHOL 141 01/12/2020 0838   TRIG 71 01/12/2020 0838   HDL 57 01/12/2020 0838   CHOLHDL 2.5 01/12/2020 0838   CHOLHDL 2.6 08/03/2014 0822   VLDL 10 08/03/2014 0822   LDLCALC 70 01/12/2020 0838    History of Present Illness    77 year old male with the above past medical history including CAD s/p CABG x5 (LIMA-LAD, SVG-DIAG, SVG-PDA, SVG-OM1-OM2) in 2008, chronic heart failure with midrange EF, paroxysmal atrial fibrillation/atrial flutter on Tikosyn, bilateral carotid artery stenosis, AAA, hypertension, hyperlipidemia, SDH s/p burr hole evacuation in 02/2023, and COPD.  He has a history of CAD as above.  Also has a history of chronic heart failure with midrange EF (previously 45 to 50%).  Most recent echocardiogram in 04/2023 showed EF improved to 55 to 60%, normal LV, LVH, low normal systolic function, mildly  elevated PASP, no significant valvular abnormalities. He has  Height:       65.0 in Accession #:    [id number]     Weight:       132.5 lb Date of Birth:  04-28-1946       BSA:          1.661 m Patient Age:    77 years       BP:           130/89 mmHg Patient Gender: M              HR:           58 bpm. Exam Location:  Inpatient  Procedure: 2D Echo, Color Doppler and Cardiac Doppler  Indications:    CHF  History:        Patient has prior history of Echocardiogram examinations, most recent  12/29/2015. CHF, CAD, COPD, Arrythmias:Atrial Fibrillation, Signs/Symptoms:Edema; Risk Factors:Hypertension.  Sonographer:    Milbert Coulter Referring Phys: [facility] GOEL  IMPRESSIONS   1. Left ventricular ejection fraction, by estimation, is 55 to 60%. The left ventricle has normal function. Left ventricular endocardial border not optimally defined to evaluate regional wall motion. There is mild left ventricular hypertrophy. Left ventricular diastolic parameters are indeterminate. 2. Right ventricular systolic function is low normal. The right ventricular size is normal. There is mildly elevated pulmonary artery systolic pressure. 3. Left atrial size was moderately dilated. 4. Right atrial size was mildly dilated. 5. The mitral valve is normal in structure. Trivial mitral valve regurgitation. No evidence of mitral stenosis. 6. The tricuspid valve is abnormal. Tricuspid valve regurgitation is mild to moderate. 7. The aortic valve is tricuspid. There is mild calcification of the aortic valve. There is mild thickening of the aortic valve. Aortic valve regurgitation is not visualized. No aortic stenosis is present. 8. The inferior vena cava is normal in size with greater than 50% respiratory variability, suggesting right atrial pressure of 3 mmHg.  FINDINGS Left Ventricle: Left ventricular ejection fraction, by estimation, is 55 to 60%. The left ventricle has normal function. Left ventricular endocardial border not optimally defined to evaluate regional wall motion. The left ventricular internal cavity size was normal in size. There is mild left ventricular hypertrophy. Left ventricular diastolic parameters are indeterminate.  Right Ventricle: The right ventricular size is normal. Right vetricular wall thickness was not well visualized. Right ventricular systolic function is low normal. There is mildly elevated pulmonary artery systolic pressure. The tricuspid regurgitant velocity is 3.08 m/s, and with  an assumed right atrial pressure of 3 mmHg, the estimated right ventricular systolic pressure is 40.9 mmHg.  Left Atrium: Left atrial size was moderately dilated.  Right Atrium: Right atrial size was mildly dilated.  Pericardium: There is no evidence of pericardial effusion.  Mitral Valve: The mitral valve is normal in structure. Trivial mitral valve regurgitation. No evidence of mitral valve stenosis.  Tricuspid Valve: The tricuspid valve is abnormal. Tricuspid valve regurgitation is mild to moderate. No evidence of tricuspid stenosis.  Aortic Valve: The aortic valve is tricuspid. There is mild calcification of the aortic valve. There is mild thickening of the aortic valve. There is mild aortic valve annular calcification. Aortic valve regurgitation is not visualized. No aortic stenosis is present. Aortic valve mean gradient measures 2.0 mmHg. Aortic valve peak gradient measures 4.2 mmHg. Aortic valve area, by VTI measures 3.23 cm.  Pulmonic Valve: The pulmonic valve was not well visualized. Pulmonic valve regurgitation is mild. No evidence of pulmonic stenosis.  Aorta: The aortic root and  Height:       65.0 in Accession #:    [id number]     Weight:       132.5 lb Date of Birth:  04-28-1946       BSA:          1.661 m Patient Age:    77 years       BP:           130/89 mmHg Patient Gender: M              HR:           58 bpm. Exam Location:  Inpatient  Procedure: 2D Echo, Color Doppler and Cardiac Doppler  Indications:    CHF  History:        Patient has prior history of Echocardiogram examinations, most recent  12/29/2015. CHF, CAD, COPD, Arrythmias:Atrial Fibrillation, Signs/Symptoms:Edema; Risk Factors:Hypertension.  Sonographer:    Milbert Coulter Referring Phys: [facility] GOEL  IMPRESSIONS   1. Left ventricular ejection fraction, by estimation, is 55 to 60%. The left ventricle has normal function. Left ventricular endocardial border not optimally defined to evaluate regional wall motion. There is mild left ventricular hypertrophy. Left ventricular diastolic parameters are indeterminate. 2. Right ventricular systolic function is low normal. The right ventricular size is normal. There is mildly elevated pulmonary artery systolic pressure. 3. Left atrial size was moderately dilated. 4. Right atrial size was mildly dilated. 5. The mitral valve is normal in structure. Trivial mitral valve regurgitation. No evidence of mitral stenosis. 6. The tricuspid valve is abnormal. Tricuspid valve regurgitation is mild to moderate. 7. The aortic valve is tricuspid. There is mild calcification of the aortic valve. There is mild thickening of the aortic valve. Aortic valve regurgitation is not visualized. No aortic stenosis is present. 8. The inferior vena cava is normal in size with greater than 50% respiratory variability, suggesting right atrial pressure of 3 mmHg.  FINDINGS Left Ventricle: Left ventricular ejection fraction, by estimation, is 55 to 60%. The left ventricle has normal function. Left ventricular endocardial border not optimally defined to evaluate regional wall motion. The left ventricular internal cavity size was normal in size. There is mild left ventricular hypertrophy. Left ventricular diastolic parameters are indeterminate.  Right Ventricle: The right ventricular size is normal. Right vetricular wall thickness was not well visualized. Right ventricular systolic function is low normal. There is mildly elevated pulmonary artery systolic pressure. The tricuspid regurgitant velocity is 3.08 m/s, and with  an assumed right atrial pressure of 3 mmHg, the estimated right ventricular systolic pressure is 40.9 mmHg.  Left Atrium: Left atrial size was moderately dilated.  Right Atrium: Right atrial size was mildly dilated.  Pericardium: There is no evidence of pericardial effusion.  Mitral Valve: The mitral valve is normal in structure. Trivial mitral valve regurgitation. No evidence of mitral valve stenosis.  Tricuspid Valve: The tricuspid valve is abnormal. Tricuspid valve regurgitation is mild to moderate. No evidence of tricuspid stenosis.  Aortic Valve: The aortic valve is tricuspid. There is mild calcification of the aortic valve. There is mild thickening of the aortic valve. There is mild aortic valve annular calcification. Aortic valve regurgitation is not visualized. No aortic stenosis is present. Aortic valve mean gradient measures 2.0 mmHg. Aortic valve peak gradient measures 4.2 mmHg. Aortic valve area, by VTI measures 3.23 cm.  Pulmonic Valve: The pulmonic valve was not well visualized. Pulmonic valve regurgitation is mild. No evidence of pulmonic stenosis.  Aorta: The aortic root and  Height:       65.0 in Accession #:    [id number]     Weight:       132.5 lb Date of Birth:  04-28-1946       BSA:          1.661 m Patient Age:    77 years       BP:           130/89 mmHg Patient Gender: M              HR:           58 bpm. Exam Location:  Inpatient  Procedure: 2D Echo, Color Doppler and Cardiac Doppler  Indications:    CHF  History:        Patient has prior history of Echocardiogram examinations, most recent  12/29/2015. CHF, CAD, COPD, Arrythmias:Atrial Fibrillation, Signs/Symptoms:Edema; Risk Factors:Hypertension.  Sonographer:    Milbert Coulter Referring Phys: [facility] GOEL  IMPRESSIONS   1. Left ventricular ejection fraction, by estimation, is 55 to 60%. The left ventricle has normal function. Left ventricular endocardial border not optimally defined to evaluate regional wall motion. There is mild left ventricular hypertrophy. Left ventricular diastolic parameters are indeterminate. 2. Right ventricular systolic function is low normal. The right ventricular size is normal. There is mildly elevated pulmonary artery systolic pressure. 3. Left atrial size was moderately dilated. 4. Right atrial size was mildly dilated. 5. The mitral valve is normal in structure. Trivial mitral valve regurgitation. No evidence of mitral stenosis. 6. The tricuspid valve is abnormal. Tricuspid valve regurgitation is mild to moderate. 7. The aortic valve is tricuspid. There is mild calcification of the aortic valve. There is mild thickening of the aortic valve. Aortic valve regurgitation is not visualized. No aortic stenosis is present. 8. The inferior vena cava is normal in size with greater than 50% respiratory variability, suggesting right atrial pressure of 3 mmHg.  FINDINGS Left Ventricle: Left ventricular ejection fraction, by estimation, is 55 to 60%. The left ventricle has normal function. Left ventricular endocardial border not optimally defined to evaluate regional wall motion. The left ventricular internal cavity size was normal in size. There is mild left ventricular hypertrophy. Left ventricular diastolic parameters are indeterminate.  Right Ventricle: The right ventricular size is normal. Right vetricular wall thickness was not well visualized. Right ventricular systolic function is low normal. There is mildly elevated pulmonary artery systolic pressure. The tricuspid regurgitant velocity is 3.08 m/s, and with  an assumed right atrial pressure of 3 mmHg, the estimated right ventricular systolic pressure is 40.9 mmHg.  Left Atrium: Left atrial size was moderately dilated.  Right Atrium: Right atrial size was mildly dilated.  Pericardium: There is no evidence of pericardial effusion.  Mitral Valve: The mitral valve is normal in structure. Trivial mitral valve regurgitation. No evidence of mitral valve stenosis.  Tricuspid Valve: The tricuspid valve is abnormal. Tricuspid valve regurgitation is mild to moderate. No evidence of tricuspid stenosis.  Aortic Valve: The aortic valve is tricuspid. There is mild calcification of the aortic valve. There is mild thickening of the aortic valve. There is mild aortic valve annular calcification. Aortic valve regurgitation is not visualized. No aortic stenosis is present. Aortic valve mean gradient measures 2.0 mmHg. Aortic valve peak gradient measures 4.2 mmHg. Aortic valve area, by VTI measures 3.23 cm.  Pulmonic Valve: The pulmonic valve was not well visualized. Pulmonic valve regurgitation is mild. No evidence of pulmonic stenosis.  Aorta: The aortic root and  Height:       65.0 in Accession #:    [id number]     Weight:       132.5 lb Date of Birth:  04-28-1946       BSA:          1.661 m Patient Age:    77 years       BP:           130/89 mmHg Patient Gender: M              HR:           58 bpm. Exam Location:  Inpatient  Procedure: 2D Echo, Color Doppler and Cardiac Doppler  Indications:    CHF  History:        Patient has prior history of Echocardiogram examinations, most recent  12/29/2015. CHF, CAD, COPD, Arrythmias:Atrial Fibrillation, Signs/Symptoms:Edema; Risk Factors:Hypertension.  Sonographer:    Milbert Coulter Referring Phys: [facility] GOEL  IMPRESSIONS   1. Left ventricular ejection fraction, by estimation, is 55 to 60%. The left ventricle has normal function. Left ventricular endocardial border not optimally defined to evaluate regional wall motion. There is mild left ventricular hypertrophy. Left ventricular diastolic parameters are indeterminate. 2. Right ventricular systolic function is low normal. The right ventricular size is normal. There is mildly elevated pulmonary artery systolic pressure. 3. Left atrial size was moderately dilated. 4. Right atrial size was mildly dilated. 5. The mitral valve is normal in structure. Trivial mitral valve regurgitation. No evidence of mitral stenosis. 6. The tricuspid valve is abnormal. Tricuspid valve regurgitation is mild to moderate. 7. The aortic valve is tricuspid. There is mild calcification of the aortic valve. There is mild thickening of the aortic valve. Aortic valve regurgitation is not visualized. No aortic stenosis is present. 8. The inferior vena cava is normal in size with greater than 50% respiratory variability, suggesting right atrial pressure of 3 mmHg.  FINDINGS Left Ventricle: Left ventricular ejection fraction, by estimation, is 55 to 60%. The left ventricle has normal function. Left ventricular endocardial border not optimally defined to evaluate regional wall motion. The left ventricular internal cavity size was normal in size. There is mild left ventricular hypertrophy. Left ventricular diastolic parameters are indeterminate.  Right Ventricle: The right ventricular size is normal. Right vetricular wall thickness was not well visualized. Right ventricular systolic function is low normal. There is mildly elevated pulmonary artery systolic pressure. The tricuspid regurgitant velocity is 3.08 m/s, and with  an assumed right atrial pressure of 3 mmHg, the estimated right ventricular systolic pressure is 40.9 mmHg.  Left Atrium: Left atrial size was moderately dilated.  Right Atrium: Right atrial size was mildly dilated.  Pericardium: There is no evidence of pericardial effusion.  Mitral Valve: The mitral valve is normal in structure. Trivial mitral valve regurgitation. No evidence of mitral valve stenosis.  Tricuspid Valve: The tricuspid valve is abnormal. Tricuspid valve regurgitation is mild to moderate. No evidence of tricuspid stenosis.  Aortic Valve: The aortic valve is tricuspid. There is mild calcification of the aortic valve. There is mild thickening of the aortic valve. There is mild aortic valve annular calcification. Aortic valve regurgitation is not visualized. No aortic stenosis is present. Aortic valve mean gradient measures 2.0 mmHg. Aortic valve peak gradient measures 4.2 mmHg. Aortic valve area, by VTI measures 3.23 cm.  Pulmonic Valve: The pulmonic valve was not well visualized. Pulmonic valve regurgitation is mild. No evidence of pulmonic stenosis.  Aorta: The aortic root and  Height:       65.0 in Accession #:    [id number]     Weight:       132.5 lb Date of Birth:  04-28-1946       BSA:          1.661 m Patient Age:    77 years       BP:           130/89 mmHg Patient Gender: M              HR:           58 bpm. Exam Location:  Inpatient  Procedure: 2D Echo, Color Doppler and Cardiac Doppler  Indications:    CHF  History:        Patient has prior history of Echocardiogram examinations, most recent  12/29/2015. CHF, CAD, COPD, Arrythmias:Atrial Fibrillation, Signs/Symptoms:Edema; Risk Factors:Hypertension.  Sonographer:    Milbert Coulter Referring Phys: [facility] GOEL  IMPRESSIONS   1. Left ventricular ejection fraction, by estimation, is 55 to 60%. The left ventricle has normal function. Left ventricular endocardial border not optimally defined to evaluate regional wall motion. There is mild left ventricular hypertrophy. Left ventricular diastolic parameters are indeterminate. 2. Right ventricular systolic function is low normal. The right ventricular size is normal. There is mildly elevated pulmonary artery systolic pressure. 3. Left atrial size was moderately dilated. 4. Right atrial size was mildly dilated. 5. The mitral valve is normal in structure. Trivial mitral valve regurgitation. No evidence of mitral stenosis. 6. The tricuspid valve is abnormal. Tricuspid valve regurgitation is mild to moderate. 7. The aortic valve is tricuspid. There is mild calcification of the aortic valve. There is mild thickening of the aortic valve. Aortic valve regurgitation is not visualized. No aortic stenosis is present. 8. The inferior vena cava is normal in size with greater than 50% respiratory variability, suggesting right atrial pressure of 3 mmHg.  FINDINGS Left Ventricle: Left ventricular ejection fraction, by estimation, is 55 to 60%. The left ventricle has normal function. Left ventricular endocardial border not optimally defined to evaluate regional wall motion. The left ventricular internal cavity size was normal in size. There is mild left ventricular hypertrophy. Left ventricular diastolic parameters are indeterminate.  Right Ventricle: The right ventricular size is normal. Right vetricular wall thickness was not well visualized. Right ventricular systolic function is low normal. There is mildly elevated pulmonary artery systolic pressure. The tricuspid regurgitant velocity is 3.08 m/s, and with  an assumed right atrial pressure of 3 mmHg, the estimated right ventricular systolic pressure is 40.9 mmHg.  Left Atrium: Left atrial size was moderately dilated.  Right Atrium: Right atrial size was mildly dilated.  Pericardium: There is no evidence of pericardial effusion.  Mitral Valve: The mitral valve is normal in structure. Trivial mitral valve regurgitation. No evidence of mitral valve stenosis.  Tricuspid Valve: The tricuspid valve is abnormal. Tricuspid valve regurgitation is mild to moderate. No evidence of tricuspid stenosis.  Aortic Valve: The aortic valve is tricuspid. There is mild calcification of the aortic valve. There is mild thickening of the aortic valve. There is mild aortic valve annular calcification. Aortic valve regurgitation is not visualized. No aortic stenosis is present. Aortic valve mean gradient measures 2.0 mmHg. Aortic valve peak gradient measures 4.2 mmHg. Aortic valve area, by VTI measures 3.23 cm.  Pulmonic Valve: The pulmonic valve was not well visualized. Pulmonic valve regurgitation is mild. No evidence of pulmonic stenosis.  Aorta: The aortic root and

## 2023-06-14 NOTE — Patient Instructions (Signed)
Medication Instructions:  Your physician recommends that you continue on your current medications as directed. Please refer to the Current Medication list given to you today.  *If you need a refill on your cardiac medications before your next appointment, please call your pharmacy*   Lab Work: NONE ordered at this time of appointment     Testing/Procedures: NONE ordered at this time of appointment     Follow-Up: At [facility], you and your health needs are our priority.  As part of our continuing mission to provide you with exceptional heart care, we have created designated Provider Care Teams.  These Care Teams include your primary Cardiologist (physician) and Advanced Practice Providers (APPs -  Physician Assistants and Nurse Practitioners) who all work together to provide you with the care you need, when you need it.  We recommend signing up for the patient portal called "MyChart".  Sign up information is provided on this After Visit Summary.  MyChart is used to connect with patients for Virtual Visits (Telemedicine).  Patients are able to view lab/test results, encounter notes, upcoming appointments, etc.  Non-urgent messages can be sent to your provider as well.   To learn more about what you can do with MyChart, go to [email or web address].    Your next appointment:   2-4 week(s) (APT for 07/2023 needs to be moved up.  Provider:   You will follow up in the Atrial Fibrillation Clinic located at [facility]. Your provider will be: Clint R. Fenton, PA-C or Lake Bells, PA-C    Other Instructions

## 2023-06-15 ENCOUNTER — Ambulatory Visit: Payer: Self-pay | Admitting: *Deleted

## 2023-06-15 NOTE — Patient Instructions (Addendum)
Visit Information  Thank you for taking time to visit with me today. Please don't hesitate to contact me if I can be of assistance to you.   Following are the goals we discussed today:   Goals Addressed             This Visit's Progress    Home management of his health related to his Chronic Obstructive Pulmonary Disease (COPD), Congestive Heart Failure (CHF), Atrial Fibrillation (AFib) and subdural hematoma-   On track    Prescription coverage concern being resolved  Patient will follow action plans to seek care for his worsening symptoms of Chronic Obstructive Pulmonary Disease (COPD), Congestive Heart Failure (CHF), Atrial Fibrillation (AFib), & subdural hematoma  Patient will learn more about his Chronic Obstructive Pulmonary Disease (COPD), Congestive Heart Failure (CHF), Atrial Fibrillation (AFib), subdural hematoma Interventions Today    Flowsheet Row Most Recent Value  Chronic Disease   Chronic disease during today's visit Chronic Obstructive Pulmonary Disease (COPD), Congestive Heart Failure (CHF), Atrial Fibrillation (AFib), Other  [subdural hematoma]  General Interventions   General Interventions Discussed/Reviewed General Interventions Reviewed, Durable Medical Equipment (DME), Sick Day Rules, [facility], Doctor Visits  [updated the outreach numbers in demographics, reviewed pathophysiology, & action plans for his Chronic Obstructive Pulmonary Disease (COPD), Congestive Heart Failure (CHF), Atrial Fibrillation (AFib), subdural hematoma, Teach back method completed.]  Doctor Visits Discussed/Reviewed --  [Encouraged outreach to cardiology office, what to say and report for worsening symptoms to office staff, discussed office appointments, urgent care, reviewed worsening symptoms that need medical outreach]  Durable Medical Equipment (DME) BP Cuff  PCP/Specialist Visits Compliance with follow-up visit  Exercise Interventions   Exercise Discussed/Reviewed Exercise  Reviewed, Physical Activity, Weight Managment  Physical Activity Discussed/Reviewed Physical Activity Reviewed, Types of exercise, Home Exercise Program (HEP)  Weight Management Weight maintenance  Education Interventions   Education Provided Provided Printed Education, Provided Education  [Congetive heart Failure (CHF), Atrial fibrillation Food labels, Actoin plans for CHF, Subdural hematoma  (SDH), sleep,oxygen, activity, urgent cares, sodium]  Nutrition Interventions   Nutrition Discussed/Reviewed Nutrition Reviewed, Fluid intake, Decreasing fats, Decreasing salt  Pharmacy Interventions   Pharmacy Dicussed/Reviewed Pharmacy Topics Reviewed, Medications and their functions, Affording Medications              Our next appointment is by telephone on 07/16/23 at 0900  Please call the care guide team at [phone] if you need to cancel or reschedule your appointment.   If you are experiencing a Mental Health or Behavioral Health Crisis or need someone to talk to, please call the Suicide and Crisis Lifeline: 988 call the [place] National Suicide Prevention Lifeline: (913)693-5359 or TTY: 802 255 9246 TTY 562-014-2182) to talk to a trained counselor call 1-800-273-TALK (toll free, 24 hour hotline) call the [facility]: 814-630-4481 call 911   No computer access, no preference for copy of AVS      The patient has been provided with contact information for the care management team and has been advised to call with any health related questions or concerns.   Keilah Lemire L. [name], RN, BSN, CCM  VBCI Care Management Coordinator

## 2023-06-15 NOTE — Patient Outreach (Signed)
Care Coordination   Follow Up Visit Note   06/15/2023 Name: [name] MRN: [id number] DOB: 12/24/1945  [name] is a 77 y.o. year old male who sees Margo Aye, Kathleene Hazel, MD for primary care. I spoke with [name], wife of   [name] by phone today. Mrs [name] reports Mr Cesena works long hours and is generally out of home at [employer] and returns after  8  pm.  What matters to the patients health and wellness today?  Action plans, home care, symptoms to monitor for at home for congestive Heart Failure (CHF),  Atrial fibrillation, , subdural hematoma (SDH) ,Chronic obstructive pulmonary disease (COPD)   Mrs Redondo reports Mr Mousel is doing well and remains very active with work except when he has to go to doctor visits. She mentioned that he expressed that he has too many doctor visits and needs to decrease some. "He is particular"  Mrs Bautista expresses that she would like for Mr Dohner to become more educated about his medical diagnoses and feels he would benefit from care coordination services.  She will attempt to find times when Mr Lienhard is available to speak with RN CM.  Mr Lasota voiced understanding of the pathophysiology of congestive Heart Failure (CHF), Atrial fibrillation, Chronic obstructive pulmonary disease (COPD) and subdural hematoma (SDH).  She is able to repeat in the teach back time, symptoms to monitor for and action plans for worsening symptoms.  She expresses that Mr Eastman needs to be aware of this information also.        Goals Addressed             This Visit's Progress    Home management of his health related to his Chronic Obstructive Pulmonary Disease (COPD), Congestive Heart Failure (CHF), Atrial Fibrillation (AFib) and subdural hematoma-   On track    Prescription coverage concern being resolved  Patient will follow action plans to seek care for his worsening symptoms of Chronic Obstructive Pulmonary Disease (COPD), Congestive Heart Failure (CHF), Atrial Fibrillation  (AFib), & subdural hematoma  Patient will learn more about his Chronic Obstructive Pulmonary Disease (COPD), Congestive Heart Failure (CHF), Atrial Fibrillation (AFib), subdural hematoma Interventions Today    Flowsheet Row Most Recent Value  Chronic Disease   Chronic disease during today's visit Chronic Obstructive Pulmonary Disease (COPD), Congestive Heart Failure (CHF), Atrial Fibrillation (AFib), Other  [subdural hematoma]  General Interventions   General Interventions Discussed/Reviewed General Interventions Reviewed, Durable Medical Equipment (DME), Sick Day Rules, [facility], Doctor Visits  [updated the outreach numbers in demographics, reviewed pathophysiology, & action plans for his Chronic Obstructive Pulmonary Disease (COPD), Congestive Heart Failure (CHF), Atrial Fibrillation (AFib), subdural hematoma, Teach back method completed.]  Doctor Visits Discussed/Reviewed --  [Encouraged outreach to cardiology office, what to say and report for worsening symptoms to office staff, discussed office appointments, urgent care, reviewed worsening symptoms that need medical outreach]  Durable Medical Equipment (DME) BP Cuff  PCP/Specialist Visits Compliance with follow-up visit  Exercise Interventions   Exercise Discussed/Reviewed Exercise Reviewed, Physical Activity, Weight Managment  Physical Activity Discussed/Reviewed Physical Activity Reviewed, Types of exercise, Home Exercise Program (HEP)  Weight Management Weight maintenance  Education Interventions   Education Provided Provided Printed Education, Provided Education  [Congetive heart Failure (CHF), Atrial fibrillation Food labels, Actoin plans for CHF, Subdural hematoma  (SDH), sleep,oxygen, activity, urgent cares, sodium]  Nutrition Interventions   Nutrition Discussed/Reviewed Nutrition Reviewed, Fluid intake, Decreasing fats, Decreasing salt  Pharmacy Interventions  Pharmacy Dicussed/Reviewed Pharmacy Topics Reviewed,  Medications and their functions, Affording Medications              SDOH assessments and interventions completed:  No     Care Coordination Interventions:  Yes, provided   Follow up plan: Follow up call scheduled for 07/16/23    Encounter Outcome:  Patient Visit Completed   Cala Bradford L. [name], RN, BSN, [facility]  VBCI Care Management Coordinator  [phone]  Fax: [phone]

## 2023-06-28 ENCOUNTER — Other Ambulatory Visit (HOSPITAL_COMMUNITY): Payer: Self-pay | Admitting: *Deleted

## 2023-06-28 ENCOUNTER — Ambulatory Visit (HOSPITAL_COMMUNITY)
Admission: RE | Admit: 2023-06-28 | Discharge: 2023-06-28 | Disposition: A | Discharge status: Home / Self Care | Payer: Medicare Other | Source: Ambulatory Visit | Attending: Physician Assistant | Admitting: Physician Assistant

## 2023-06-28 VITALS — BP 142/110 | HR 109 | Ht 65.0 in | Wt 138.2 lb

## 2023-06-28 DIAGNOSIS — Z951 Presence of aortocoronary bypass graft: Secondary | ICD-10-CM | POA: Diagnosis not present

## 2023-06-28 DIAGNOSIS — I480 Paroxysmal atrial fibrillation: Secondary | ICD-10-CM | POA: Insufficient documentation

## 2023-06-28 DIAGNOSIS — I5022 Chronic systolic (congestive) heart failure: Secondary | ICD-10-CM | POA: Diagnosis not present

## 2023-06-28 DIAGNOSIS — I110 Hypertensive heart disease with heart failure: Secondary | ICD-10-CM | POA: Diagnosis not present

## 2023-06-28 DIAGNOSIS — Z79899 Other long term (current) drug therapy: Secondary | ICD-10-CM | POA: Diagnosis not present

## 2023-06-28 DIAGNOSIS — D6869 Other thrombophilia: Secondary | ICD-10-CM | POA: Diagnosis not present

## 2023-06-28 DIAGNOSIS — I2510 Atherosclerotic heart disease of native coronary artery without angina pectoris: Secondary | ICD-10-CM | POA: Diagnosis not present

## 2023-06-28 DIAGNOSIS — J449 Chronic obstructive pulmonary disease, unspecified: Secondary | ICD-10-CM | POA: Diagnosis not present

## 2023-06-28 DIAGNOSIS — I484 Atypical atrial flutter: Secondary | ICD-10-CM | POA: Diagnosis not present

## 2023-06-28 DIAGNOSIS — Z87820 Personal history of traumatic brain injury: Secondary | ICD-10-CM | POA: Insufficient documentation

## 2023-06-28 MED ORDER — DILTIAZEM HCL 30 MG PO TABS: ORAL_TABLET | ORAL | 1 refills | Status: DC

## 2023-06-28 NOTE — Patient Instructions (Signed)
STOP dofetilide (tikosyn)

## 2023-06-28 NOTE — Progress Notes (Signed)
Primary Care Physician: Benita Stabile, MD Referring Physician:Dr. Pruitt Zaretsky is a 77 y.o. male with a h/o CAD s/p CABG 2008, HFmrEF, COPD, HTN, carotid atery disease, atrial fibrillation, traumatic SDH who presents for follow up in the [facility] Atrial Fibrillation [facility]. Patient is s/p Tikosyn loading at [facility] 01/10/16 .   Patient presented to the ED at [facility] 01/31/23 after falling off a trailer and hitting his head. He was found to have a SDH. Repeat CT was stable. He was discharged with neurology follow up. His Xarelto was held. His BB was discontinued due to bradycardia. He has noticed his BP reading at home have been more elevated.   His anticoagulant, Xarelto, was held for a week and then resumed. However, he was readmitted in July with a worsening subdural hematoma, requiring reversal of Xarelto and a burr hole for evacuation. This hospitalization was complicated by recurrent atrial fibrillation, which converted back to sinus rhythm after a dose of dofetilide. His dofetilide dose was reduced due to worsening renal function. In August 2024, the patient was readmitted with right-sided weakness. MRI was negative. Neurosurgery attempted middle meningeal arterial embolization, but this was unsuccessful.   On follow up today, patient was in atrial flutter at his visit on 10/17 and is also in atrial flutter today. He brings in HR readings from home which show he is in atrial flutter for several days at a time, more than SR. He is unaware of his arrhythmia.    Today, he denies symptoms of palpitations, chest pain, shortness of breath, orthopnea, PND, lower extremity edema, dizziness, presyncope, syncope, or neurologic sequela. The patient is tolerating medications without difficulties and is otherwise without complaint today.   Past Medical History:  Diagnosis Date   AAA (abdominal aortic aneurysm) (HCC)    06/27/20 MRI: 3.0 cm infrarenal AAA, recommend 3 year follow-up   Anxiety     Atrial fibrillation (HCC)    Bilateral carotid artery disease (HCC) 07/21/2013   CAD (coronary artery disease) 2008   CABG   COPD (chronic obstructive pulmonary disease) (HCC)    Dysrhythmia    A-fib   Gastritis    Headache(784.0)    History of hiatal hernia    Hypertension    SDH (subdural hematoma) (HCC) 01/31/2023   Visit for monitoring Tikosyn therapy 12/2015    Current Outpatient Medications  Medication Sig Dispense Refill   acetaminophen (TYLENOL) 500 MG tablet Take 1 tablet (500 mg total) by mouth every 6 (six) hours for 8 days then as directed by MD (Patient taking differently: Take 500 mg by mouth as needed.) 100 tablet 0   ALPRAZolam (XANAX) 0.25 MG tablet TAKE 1 TABLET(0.25 MG) BY MOUTH DAILY (Patient taking differently: Take 0.25 mg by mouth at bedtime.) 10 tablet 0   ascorbic acid (VITAMIN C) 500 MG tablet Take 500 mg by mouth at bedtime.     atorvastatin (LIPITOR) 40 MG tablet TAKE 1 TABLET(40 MG) BY MOUTH DAILY (Patient taking differently: Take 40 mg by mouth at bedtime.) 90 tablet 1   diltiazem (CARDIZEM) 30 MG tablet Take 1 tablet every 4 hours AS NEEDED for heart rate >100 as long as blood pressure >100. 45 tablet 1   dofetilide (TIKOSYN) 250 MCG capsule Take 1 capsule (250 mcg total) by mouth 2 (two) times daily. 60 capsule 6   empagliflozin (JARDIANCE) 10 MG TABS tablet Take 1 tablet (10 mg total) by mouth daily. 30 tablet 1   furosemide (  LASIX) 20 MG tablet Take 1 tablet (20 mg total) by mouth daily. 30 tablet 1   loperamide (IMODIUM A-D) 2 MG tablet Take 2 mg by mouth as needed for diarrhea or loose stools.     losartan (COZAAR) 25 MG tablet Take 1 tablet (25 mg total) by mouth daily. 30 tablet 11   metoprolol succinate (TOPROL XL) 25 MG 24 hr tablet Take 1 tablet (25 mg total) by mouth 2 (two) times daily. 60 tablet 11   Multiple Vitamins-Minerals (CENTRUM SILVER PO) Take 1 tablet by mouth at bedtime.     nitroGLYCERIN (NITROSTAT) 0.4 MG SL tablet Place 1 tablet  (0.4 mg total) under the tongue every 5 (five) minutes as needed for chest pain. 10 tablet 3   Omega-3 Fatty Acids (FISH OIL) 1000 MG CAPS Take 1,200 mg by mouth at bedtime.     potassium chloride SA (KLOR-CON M) 20 MEQ tablet Take 1 tablet (20 mEq total) by mouth daily. 30 tablet 1   No current facility-administered medications for this encounter.    ROS- All systems are reviewed and negative except as per the HPI above  Physical Exam: Vitals:   06/28/23 0921  Weight: 62.7 kg  Height: 5\' 5"  (1.651 m)    GEN: Well nourished, well developed in no acute distress NECK: No JVD; No carotid bruits CARDIAC: Regular rate and rhythm, no murmurs, rubs, gallops RESPIRATORY:  Clear to auscultation without rales, wheezing or rhonchi  ABDOMEN: Soft, non-tender, non-distended EXTREMITIES:  No edema; No deformity    EKG today demonstrates Atrial flutter with 2:1 block Vent. rate 109 BPM PR interval 280 ms QRS duration 76 ms QT/QTcB 272/366 ms   Echo 05/13/23  1. Left ventricular ejection fraction, by estimation, is 55 to 60%. The  left ventricle has normal function. Left ventricular endocardial border  not optimally defined to evaluate regional wall motion. There is mild left  ventricular hypertrophy. Left ventricular diastolic parameters are indeterminate.   2. Right ventricular systolic function is low normal. The right  ventricular size is normal. There is mildly elevated pulmonary artery  systolic pressure.   3. Left atrial size was moderately dilated.   4. Right atrial size was mildly dilated.   5. The mitral valve is normal in structure. Trivial mitral valve  regurgitation. No evidence of mitral stenosis.   6. The tricuspid valve is abnormal. Tricuspid valve regurgitation is mild  to moderate.   7. The aortic valve is tricuspid. There is mild calcification of the  aortic valve. There is mild thickening of the aortic valve. Aortic valve  regurgitation is not visualized. No aortic  stenosis is present.   8. The inferior vena cava is normal in size with greater than 50%  respiratory variability, suggesting right atrial pressure of 3 mmHg.     Epic records reviewed   CHA2DS2-VASc Score = 4  The patient's score is based upon: CHF History: 0 HTN History: 1 Diabetes History: 0 Stroke History: 0 Vascular Disease History: 1 Age Score: 2 Gender Score: 0       ASSESSMENT AND PLAN: Paroxysmal Atrial Fibrillation/atrial flutter The patient's CHA2DS2-VASc score is 4, indicating a 4.8% annual risk of stroke.   Unfortunately, patient appears to be having more frequent afib and atrial flutter, staying out of rhythm for several days in a row. Since he is not on anticoagulation, will need to discontinue dofetilide and pursue rate control for now. Discussed with EP.  Currently off Xarelto with traumatic subdural hematoma with  recurrence.  Continue Toprol 25 mg BID Continue diltiazem 30 mg q 4 hours PRN for heart racing.  Secondary Hypercoagulable State (ICD10:  D68.69) The patient is at significant risk for stroke/thromboembolism based upon his CHA2DS2-VASc Score of 4.  However, the patient is not on anticoagulation due to his high bleeding risk following traumatic SDH. He has an appointment to discuss Watchman with Dr Lalla Brothers.   HTN Stable on current regimen  CAD S/p CABG 2008 No anginal symptoms  Subdural hematoma s/p burr hole evacuation in July 2024. Unsuccessful meningeal artery embolization in 08/24. Not on anticoagulation.    Follow up with Tereso Newcomer and Dr Lalla Brothers as scheduled.    Jorja Loa PA-C Afib Clinic [facility] [address] [place], [place] [address] [phone]

## 2023-06-30 ENCOUNTER — Other Ambulatory Visit (HOSPITAL_COMMUNITY): Payer: Self-pay | Admitting: Physician Assistant

## 2023-07-02 ENCOUNTER — Telehealth (HOSPITAL_COMMUNITY): Payer: Self-pay | Admitting: *Deleted

## 2023-07-02 ENCOUNTER — Telehealth: Payer: Self-pay | Admitting: Cardiology

## 2023-07-02 MED ORDER — POTASSIUM CHLORIDE CRYS ER 20 MEQ PO TBCR: 20.0000 meq | EXTENDED_RELEASE_TABLET | Freq: Every day | ORAL | 3 refills | Status: DC

## 2023-07-02 MED ORDER — FUROSEMIDE 20 MG PO TABS: 20.0000 mg | ORAL_TABLET | Freq: Every day | ORAL | 3 refills | Status: DC

## 2023-07-02 MED ORDER — EMPAGLIFLOZIN 10 MG PO TABS: 10.0000 mg | ORAL_TABLET | Freq: Every day | ORAL | 3 refills | Status: DC

## 2023-07-02 NOTE — Telephone Encounter (Signed)
Patient wife called in stating patient came home this afternoon after working most of the day reporting multiple episodes of dizziness, nausea and shortness of breath. When he got home today he took a PRN cardizem and is feeling better HR currently 70 BP 143/63. Inquired what vitals were prior to taking PRN cardizem but pt did not check prior to taking. Pt denied syncope but stated his vision blurred with the last episode of dizziness this prompted him to stop working for the day. ER precautions reviewed with patient. Pt states at this point he feels much better but if symptoms return he will proceed to ER.

## 2023-07-02 NOTE — Telephone Encounter (Signed)
*  STAT* If patient is at the pharmacy, call can be transferred to refill team.   1. Which medications need to be refilled? (please list name of each medication and dose if known) empagliflozin (JARDIANCE) 10 MG TABS tablet   furosemide (LASIX) 20 MG tablet   potassium chloride SA (KLOR-CON M) 20 MEQ tablet   2. Which pharmacy/location (including street and city if local pharmacy) is medication to be sent to? [id number] - [place], [place] - [address]   3. Do they need a 30 day or 90 day supply? 90

## 2023-07-04 ENCOUNTER — Other Ambulatory Visit (HOSPITAL_BASED_OUTPATIENT_CLINIC_OR_DEPARTMENT_OTHER): Payer: Self-pay

## 2023-07-04 ENCOUNTER — Telehealth: Payer: Self-pay | Admitting: Cardiology

## 2023-07-04 MED ORDER — POTASSIUM CHLORIDE CRYS ER 10 MEQ PO TBCR: 20.0000 meq | EXTENDED_RELEASE_TABLET | Freq: Every day | ORAL | 3 refills | Status: DC

## 2023-07-04 NOTE — Telephone Encounter (Signed)
Pt c/o medication issue:  1. Name of Medication: potassium chloride SA (KLOR-CON M) 20 MEQ tablet   2. How are you currently taking this medication (dosage and times per day)? 10 MEQ is how he was taking this   3. Are you having a reaction (difficulty breathing--STAT)?   4. What is your medication issue? Patient's wife is requesting call back to see if this medication was called in wrong. She states that it has been filled for 10 MEQ in the past and would like to discuss this further to see when it was changed. Please advise.

## 2023-07-04 NOTE — Telephone Encounter (Signed)
Called patient's wife, DPR about message. Informed her that after his hospital visit the potassium was increased to 20 meq due to low potassium. Patient has a hard time swallowing bigger potassium pill, sent in what patient was taking before potassium 10 meq, and he will just take 2 to equal 20 meq dose. Patient's wife agreed to plan.

## 2023-07-16 ENCOUNTER — Ambulatory Visit: Payer: Self-pay | Admitting: *Deleted

## 2023-07-16 ENCOUNTER — Ambulatory Visit: Payer: Medicare Other | Attending: Physician Assistant | Admitting: Physician Assistant

## 2023-07-16 ENCOUNTER — Encounter: Payer: Self-pay | Admitting: Physician Assistant

## 2023-07-16 VITALS — BP 100/60 | HR 96 | Ht 65.0 in | Wt 143.0 lb

## 2023-07-16 DIAGNOSIS — I10 Essential (primary) hypertension: Secondary | ICD-10-CM | POA: Insufficient documentation

## 2023-07-16 DIAGNOSIS — I5032 Chronic diastolic (congestive) heart failure: Secondary | ICD-10-CM | POA: Diagnosis not present

## 2023-07-16 DIAGNOSIS — I6523 Occlusion and stenosis of bilateral carotid arteries: Secondary | ICD-10-CM | POA: Insufficient documentation

## 2023-07-16 DIAGNOSIS — I25118 Atherosclerotic heart disease of native coronary artery with other forms of angina pectoris: Secondary | ICD-10-CM | POA: Diagnosis not present

## 2023-07-16 DIAGNOSIS — I480 Paroxysmal atrial fibrillation: Secondary | ICD-10-CM | POA: Insufficient documentation

## 2023-07-16 MED ORDER — METOPROLOL SUCCINATE ER 25 MG PO TB24: ORAL_TABLET | ORAL | 5 refills | Status: DC

## 2023-07-16 NOTE — Patient Instructions (Signed)
Medication Instructions:  INCREASE Toprol XL to 50mg  in the morning and 25mg  in the evening  *If you need a refill on your cardiac medications before your next appointment, please call your pharmacy*   Lab Work: TODAY-BMET If you have labs (blood work) drawn today and your tests are completely normal, you will receive your results only by: MyChart Message (if you have MyChart) OR A paper copy in the mail If you have any lab test that is abnormal or we need to change your treatment, we will call you to review the results.   Testing/Procedures: NONE ORDERED   Follow-Up: At [facility], you and your health needs are our priority.  As part of our continuing mission to provide you with exceptional heart care, we have created designated Provider Care Teams.  These Care Teams include your primary Cardiologist (physician) and Advanced Practice Providers (APPs -  Physician Assistants and Nurse Practitioners) who all work together to provide you with the care you need, when you need it.  We recommend signing up for the patient portal called "MyChart".  Sign up information is provided on this After Visit Summary.  MyChart is used to connect with patients for Virtual Visits (Telemedicine).  Patients are able to view lab/test results, encounter notes, upcoming appointments, etc.  Non-urgent messages can be sent to your provider as well.   To learn more about what you can do with MyChart, go to [email or web address].    Your next appointment:   6 month(s)  Provider:   Donato Schultz, MD     Other Instructions ONCE YOU GET A NEW BLOOD PRESSURE CUFF, CHECK YOUR BLOOD PRESSURE DAILY. IF YOU HAVE 3 CONSECUTIVE READINGS OVER 140 CONTACT THE OFFICE.

## 2023-07-16 NOTE — Assessment & Plan Note (Signed)
He has been having intermittent episodes of atrial fibrillation.  Decision was made by the A-fib clinic to take him off of dofetilide and manage him with a rate control strategy.  He has had several episodes of high heart rates at home that are likely episodes of atrial fibrillation.  He does not experience any palpitations.  However, he does note episodes of shortness of breath at times with exertion.  Question if he is in rapid atrial fibrillation at those times contributing to his symptoms.  He seems to be in atrial fibrillation on exam today. -Increase metoprolol succinate to 50 mg in the morning and 25 mg in the evening -Continue diltiazem 30 mg as needed for rapid heart rates -Evaluation pending with EP in December to discuss Watchman device

## 2023-07-16 NOTE — Assessment & Plan Note (Signed)
Status post CABG in 2008.  He is not having chest discomfort to suggest angina.  He had a recent CT scan in September.  He still has residual subdural hematoma.  I do not think he is a candidate for aspirin at this time.  Continue Lipitor 40 mg daily, nitroglycerin as needed.

## 2023-07-16 NOTE — Assessment & Plan Note (Signed)
As noted, blood pressure somewhat borderline.  Stop losartan.  Increase metoprolol succinate to 50 mg in the morning 25 mg in the evening to better control his heart rate.  If his blood pressure increases to 140 or higher, we can restart his losartan.

## 2023-07-16 NOTE — Assessment & Plan Note (Signed)
Recent admission for acute on chronic heart failure in September.  Current volume status appears stable.  He is NYHA IIb-III.  He does have occasional episodes of shortness of breath with exertion.  I suspect this may be related to atrial fibrillation.  Continue Jardiance 10 mg daily, Lasix 20 mg daily.  His blood pressure is somewhat borderline and I have increased his metoprolol succinate today to help with his heart rate.  Therefore, I have DC'd his losartan.

## 2023-07-16 NOTE — Patient Outreach (Signed)
  Care Coordination   07/16/2023 Name: [name] MRN: [id number] DOB: 1945/10/11   Care Coordination Outreach Attempts:  An unsuccessful telephone outreach was attempted today to offer the patient information about available care coordination services.  Follow Up Plan:  Additional outreach attempts will be made to offer the patient care coordination information and services.   Encounter Outcome:  No Answer   Care Coordination Interventions:  Yes, provided    Elford Evilsizer L. [name], RN, BSN, [facility]  VBCI Care Management Coordinator  [phone]  Fax: [phone]

## 2023-07-16 NOTE — Assessment & Plan Note (Signed)
Carotid US in January 2024 with 60-79% right ICA stenosis.  He is due for follow-up carotid Dopplers in January 2025.

## 2023-07-16 NOTE — Progress Notes (Signed)
Cardiology Office Note:    Date:  07/16/2023  ID:  [name], [name] [birth year], MRN [id number] PCP: Benita Stabile, MD  [facility] Providers Cardiologist:  Donato Schultz, MD       Patient Profile:      Coronary artery disease S/p CABG 08-12-07 Paroxysmal atrial fibrillation S/p DCCV in Aug 12, 2015, Aug 11, 2016  Rx: Dofetilide  Anticoagulation DC'd in 01/2023 2/2 subdural hematoma HFimpEF (heart failure with improved ejection fraction)  Tachy mediated?? TEE 09/08/2015: EF 35-40 TTE 01/25/2016: EF 45-50, GLS -13, trivial MR, severe LAE, mild RVE, mild RAE, trivial TR, PASP 21 TTE 05/12/2023: EF 55-60, mild LVH, low normal RVSF, mildly elevated PASP, moderate LAE, mild RAE, trivial MR, mild-moderate TR, RAP 3, RVSP 40.9 Abdominal aortic aneurysm  Notes: "05/2020: 3.0 cm"  Carotid artery disease [place] 09/06/22: RICA 60-79; LICA 1-39 Chronic Obstructive Pulmonary Disease Hypertension  Hx of subdural hematoma  FHx of CAD, sudden cardiac death  S/p spine surgery 08/11/20           History of Present Illness:  Discussed the use of AI scribe software for clinical note transcription with the patient, who gave verbal consent to proceed.  [name] is a 77 y.o. male who returns for CAD, AF, CHF.  In June 2024, the patient was admitted after a fall resulting in a subdural hematoma. His Xarelto, was held for a week and then resumed. However, he was readmitted in July with a worsening subdural hematoma, requiring reversal of Xarelto and a burr hole for evacuation. This hospitalization was complicated by recurrent atrial fibrillation, which converted back to sinus rhythm after a dose of dofetilide. His dofetilide dose was reduced due to worsening renal function. In August 2024, the patient was readmitted with right-sided weakness. MRI was negative. Neurosurgery attempted middle meningeal arterial embolization, but this was unsuccessful. He was last seen in 03/2023.   He was admitted in 04/2023 with a/c CHF.  His ACE was held due to AKI. SGLT2 inhib was added to his Rx. EF improved to 55-60 on TTE. He had follow up in the CHF clinic in 04/2023. SCr was stable and he was started on Losartan. He had a post hosp follow up with Bernadene Person, NP 06/14/23. He saw the AFib clinic 06/28/23 and was noted to be having more breakthrough atrial fibrillation. It was decided to pursue a rate ctl strategy as he is off of anticoagulation. Dofetilide was DC'd. He was kept on metoprolol twice daily and diltiazem prn.    He is here with his wife.  His wife shows me a detailed list of his blood pressures and heart rates.  He has had several episodes of atrial fibrillation.  Most of the time, his heart rates are in the 1 teens.  He has had some 140s to 160s recorded.  He has no symptoms with this.  He has been managing these episodes with diltiazem as needed. He also reports shortness of breath, particularly with exertion.  He denies any chest pain.  He has not had leg edema, syncope.     Review of Systems  Gastrointestinal:  Negative for hematochezia and melena.  Genitourinary:  Negative for hematuria.  See HPI     Studies Reviewed:       Labs-chart review 06/01/2023: Na 134, K+ 4.2, creatinine 1.45 05/13/2023: Hgb 12.7, ALT 39      Risk Assessment/Calculations:    CHA2DS2-VASc Score = 5   This indicates a 7.2% annual risk of stroke.  The patient's score is based upon: CHF History: 1 HTN History: 1 Diabetes History: 0 Stroke History: 0 Vascular Disease History: 1 Age Score: 2 Gender Score: 0            Physical Exam:   VS:  BP 100/60   Pulse 96   Ht 5\' 5"  (1.651 m)   Wt 143 lb (64.9 kg)   SpO2 94%   BMI 23.80 kg/m    Wt Readings from Last 3 Encounters:  07/16/23 143 lb (64.9 kg)  06/28/23 138 lb 3.2 oz (62.7 kg)  06/14/23 143 lb 6.4 oz (65 kg)    Constitutional:      Appearance: Healthy appearance. Not in distress.  Neck:     Vascular: No JVR. JVD normal.  Pulmonary:     Breath sounds: Normal  breath sounds. No wheezing. No rales.  Cardiovascular:     Normal rate. Irregularly irregular rhythm.     Murmurs: There is no murmur.  Edema:    Peripheral edema absent.  Abdominal:     Palpations: Abdomen is soft.        Assessment and Plan:   Assessment & Plan Paroxysmal atrial fibrillation (HCC) He has been having intermittent episodes of atrial fibrillation.  Decision was made by the A-fib clinic to take him off of dofetilide and manage him with a rate control strategy.  He has had several episodes of high heart rates at home that are likely episodes of atrial fibrillation.  He does not experience any palpitations.  However, he does note episodes of shortness of breath at times with exertion.  Question if he is in rapid atrial fibrillation at those times contributing to his symptoms.  He seems to be in atrial fibrillation on exam today. -Increase metoprolol succinate to 50 mg in the morning and 25 mg in the evening -Continue diltiazem 30 mg as needed for rapid heart rates -Evaluation pending with EP in December to discuss Watchman device Coronary artery disease involving native coronary artery of native heart with other form of angina pectoris (HCC) Status post CABG in 2008.  He is not having chest discomfort to suggest angina.  He had a recent CT scan in September.  He still has residual subdural hematoma.  I do not think he is a candidate for aspirin at this time.  Continue Lipitor 40 mg daily, nitroglycerin as needed. Heart failure with improved ejection fraction (HFimpEF) (HCC) Recent admission for acute on chronic heart failure in September.  Current volume status appears stable.  He is NYHA IIb-III.  He does have occasional episodes of shortness of breath with exertion.  I suspect this may be related to atrial fibrillation.  Continue Jardiance 10 mg daily, Lasix 20 mg daily.  His blood pressure is somewhat borderline and I have increased his metoprolol succinate today to help with  his heart rate.  Therefore, I have DC'd his losartan. Essential hypertension, benign As noted, blood pressure somewhat borderline.  Stop losartan.  Increase metoprolol succinate to 50 mg in the morning 25 mg in the evening to better control his heart rate.  If his blood pressure increases to 140 or higher, we can restart his losartan. Bilateral carotid artery stenosis Carotid US in January 2024 with 60-79% right ICA stenosis.  He is due for follow-up carotid Dopplers in January 2025.        Dispo:  Return in about 6 months (around 01/13/2024) for Routine Follow Up, w/ Dr. Anne Fu.  Signed, Tereso Newcomer, PA-C

## 2023-07-17 ENCOUNTER — Telehealth: Payer: Self-pay

## 2023-07-17 LAB — BASIC METABOLIC PANEL
BUN/Creatinine Ratio: 14 (ref 10–24)
BUN: 20 mg/dL (ref 8–27)
CO2: 19 mmol/L — ABNORMAL LOW (ref 20–29)
Calcium: 9 mg/dL (ref 8.6–10.2)
Chloride: 104 mmol/L (ref 96–106)
Creatinine, Ser: 1.48 mg/dL — ABNORMAL HIGH (ref 0.76–1.27)
Glucose: 89 mg/dL (ref 70–99)
Potassium: 4.8 mmol/L (ref 3.5–5.2)
Sodium: 140 mmol/L (ref 134–144)
eGFR: 48 mL/min/{1.73_m2} — ABNORMAL LOW (ref >59)

## 2023-07-17 NOTE — Telephone Encounter (Signed)
-----   Message from Tereso Newcomer sent at 07/17/2023  8:05 AM EST ----- Renal function (creatinine) stable.  Potassium normal. PLAN:  - Continue current medications/treatment plan and follow up as scheduled.  Tereso Newcomer, PA-C    07/17/2023 8:03 AM

## 2023-07-17 NOTE — Telephone Encounter (Signed)
Attempted to contact patient. Left message labs stable, to continue current treatment plan, and follow up as scheduled. DPR on file

## 2023-08-08 ENCOUNTER — Telehealth: Payer: Self-pay | Admitting: *Deleted

## 2023-08-08 ENCOUNTER — Ambulatory Visit (HOSPITAL_COMMUNITY): Payer: Medicare Other | Admitting: Physician Assistant

## 2023-08-08 NOTE — Progress Notes (Signed)
  Care Coordination Note  08/08/2023 Name: [name] MRN: [id number] DOB: [birth year]  [name] is a 77 y.o. year old male who is a primary care patient of Benita Stabile, MD and is actively engaged with the care management team. I reached out to [name] by phone today to assist with re-scheduling a follow up visit with the RN Case Manager  Follow up plan: Unsuccessful telephone outreach attempt made. A HIPAA compliant phone message was left for the patient providing contact information and requesting a return call.   [facility]  Care Coordination Care Guide  Direct Dial: [phone]

## 2023-08-10 ENCOUNTER — Encounter: Payer: Medicare Other | Admitting: *Deleted

## 2023-08-13 NOTE — Progress Notes (Signed)
  Care Coordination Note  08/13/2023 Name: [name] MRN: [id number] DOB: 05/16/1946  [name] is a 77 y.o. year old male who is a primary care patient of Benita Stabile, MD and is actively engaged with the care management team. I reached out to [name] by phone today to assist with re-scheduling a follow up visit with the RN Case Manager  Follow up plan: Unsuccessful telephone outreach attempt made. A HIPAA compliant phone message was left for the patient providing contact information and requesting a return call.  We have been unable to make contact with the patient for follow up. The care management team is available to follow up with the patient after provider conversation with the patient regarding recommendation for care management engagement and subsequent re-referral to the care management team.   [facility] Coordination Care Guide  Direct Dial: [phone]

## 2023-08-14 NOTE — Patient Outreach (Signed)
  Care Coordination   08/14/2023 Name: [name] MRN: [id number] DOB: [birth year]   Care Coordination Outreach Attempts:  A third unsuccessful outreach was attempted today to offer the patient with information about available complex care management services.  Case closure after VBCI CA unable to reach him to reschedule his missed appointment   Follow Up Plan:  No further outreach attempts will be made at this time. We have been unable to contact the patient to offer or enroll patient in complex care management services.  Encounter Outcome:  No Answer   Care Coordination Interventions:  No, not indicated    [name] L. [name], RN, BSN, [facility]  VBCI Care Management Coordinator  [phone]  Fax: [phone]

## 2023-08-28 ENCOUNTER — Encounter: Payer: Self-pay | Admitting: Cardiology

## 2023-08-28 ENCOUNTER — Ambulatory Visit: Payer: Medicare Other | Attending: Cardiology | Admitting: Cardiology

## 2023-08-28 VITALS — BP 120/72 | HR 86 | Ht 65.0 in | Wt 144.8 lb

## 2023-08-28 DIAGNOSIS — Z8679 Personal history of other diseases of the circulatory system: Secondary | ICD-10-CM | POA: Diagnosis not present

## 2023-08-28 DIAGNOSIS — I4821 Permanent atrial fibrillation: Secondary | ICD-10-CM | POA: Diagnosis not present

## 2023-08-28 NOTE — Patient Instructions (Signed)
 Medication Instructions:  Your physician recommends that you continue on your current medications as directed. Please refer to the Current Medication list given to you today.  *If you need a refill on your cardiac medications before your next appointment, please call your pharmacy*  Testing/Procedures: Your physician has requested that you have Left atrial appendage (LAA) closure device implantation is a procedure to put a small device in the LAA of the heart. The LAA is a small sac in the wall of the heart's left upper chamber. Blood clots can form in this area. The device, Watchman closes the LAA to help prevent a blood clot and stroke.   Follow-Up: At [facility], you and your health needs are our priority.  As part of our continuing mission to provide you with exceptional heart care, we have created designated Provider Care Teams.  These Care Teams include your primary Cardiologist (physician) and Advanced Practice Providers (APPs -  Physician Assistants and Nurse Practitioners) who all work together to provide you with the care you need, when you need it.  Your next appointment:   3 months  Provider:   Kate Minus, NP    Other Instructions You have been referred to a neurologist

## 2023-08-28 NOTE — Progress Notes (Signed)
 Electrophysiology Office Note:    Date:  08/28/2023   ID:  [name], [name] [birth year], MRN [id number]  [facility] Cardiologist:  Oneil Parchment, MD  [facility] Electrophysiologist:  [name], MD   Referring MD: Shona Norleen PEDLAR, MD   Chief Complaint: Atrial fibrillation  History of Present Illness:    [name] is a 77 year old man who I am seeing today for an evaluation of atrial fibrillation at the request of [name].  The patient has a history of coronary artery disease, heart failure and atrial fibrillation.  He also has a history of subdural hematoma after a fall with head injury.  His Xarelto  was reversed in July 2024 after his fall and he required a bur hole for hematoma evacuation.  He is on Tikosyn  for rhythm control.  He has had a heart failure hospitalization in September of this year.  Tikosyn  was stopped given worsening of his atrial fibrillation and a rate control strategy was pursued.  He has a history of CABG in 2008.  He is with his wife today in clinic.  He has not been back on his blood thinner since his brain bleed.  He confirms today that his prior bleeding event was traumatic in nature.  He lost grip of an electrical unit while at work and fell backwards striking his head on the concrete.    Their past medical, social and family history was reviewed.   ROS:   Please see the history of present illness.    All other systems reviewed and are negative.  EKGs/Labs/Other Studies Reviewed:    The following studies were reviewed today:  May 13, 2023 echo EF 55-60 RV function low normal Moderately dilated left atrium Mildly dilated right atrium Trivial MR Mild to moderate TR  September 2024 CT chest personally reviewed Left atrial appendage size/morphology appears amenable to closure.      Physical Exam:    VS:  BP 120/72 (BP Location: Left Arm, Patient Position: Sitting, Cuff Size: Normal)   Pulse 86   Ht 5' 5 (1.651 m)   Wt 144  lb 12.8 oz (65.7 kg)   SpO2 98%   BMI 24.10 kg/m     Wt Readings from Last 3 Encounters:  08/28/23 144 lb 12.8 oz (65.7 kg)  07/16/23 143 lb (64.9 kg)  06/28/23 138 lb 3.2 oz (62.7 kg)     GEN: no distress.  Elderly.  Appears younger than stated age CARD: Irregularly irregular, No MRG RESP: No IWOB. CTAB.        ASSESSMENT AND PLAN:    1. Permanent atrial fibrillation (HCC)   2. History of subdural hematoma     #Atrial fibrillation #History of subdural hematoma, traumatic The patient has permanent atrial fibrillation with a rate control strategy being employed.  He follows with the A-fib clinic.  He was previous on anticoagulation but this was stopped after a subdural hematoma occurring in the setting of a fall.  He required bur holes for drainage.  He is currently off anticoagulation.  I discussed stroke risk mitigation strategies with the patient including left atrial appendage occlusion.  Around the time of watchman implant, he would need to be on a short course of anticoagulation.  He will need to discuss this with his neurosurgeon prior to starting.  Otherwise I think he is an acceptable candidate for watchman implant.  If the neurosurgery team cleared him for restarting anticoagulation, he would need to be on the medication for 1  week prior to the implant and for 45 days following implant.  I have seen [name] in the office today who is being considered for a Watchman left atrial appendage closure device. I believe they will benefit from this procedure given their history of atrial fibrillation, CHA2DS2-VASc score of 5. Unfortunately, the patient is not felt to be a long term anticoagulation candidate secondary to history of intracranial hemorrhage (SDH). The patient's chart has been reviewed and I feel that they would be a candidate for short term oral anticoagulation after Watchman implant.   It is my belief that after undergoing a LAA closure procedure, [name]  will not need long term anticoagulation which eliminates anticoagulation side effects and major bleeding risk.   Procedural risks for the Watchman implant have been reviewed with the patient including a 0.5% risk of stroke, <1% risk of perforation and <1% risk of device embolization. Other risks include bleeding, vascular damage, tamponade, worsening renal function, and death. The patient understands these risk and wishes to proceed.     The published clinical data on the safety and effectiveness of WATCHMAN include but are not limited to the following: - Holmes DR, Jess BEARD, Sick P et al. for the PROTECT AF Investigators. Percutaneous closure of the left atrial appendage versus warfarin therapy for prevention of stroke in patients with atrial fibrillation: a randomised non-inferiority trial. Lancet 2009; 374: 534-42. GLENWOOD Jess BEARD, Doshi SK, Jonita VEAR Satchel D et al. on behalf of the PROTECT AF Investigators. Percutaneous Left Atrial Appendage Closure for Stroke Prophylaxis in Patients With Atrial Fibrillation 2.3-Year Follow-up of the PROTECT AF (Watchman Left Atrial Appendage System for Embolic Protection in Patients With Atrial Fibrillation) Trial. Circulation 2013; 127:720-729. - Alli O, Doshi S,  Kar S, Reddy VY, Sievert H et al. Quality of Life Assessment in the Randomized PROTECT AF (Percutaneous Closure of the Left Atrial Appendage Versus Warfarin Therapy for Prevention of Stroke in Patients With Atrial Fibrillation) Trial of Patients at Risk for Stroke With Nonvalvular Atrial Fibrillation. J Am Coll Cardiol 2013; 61:1790-8. GLENWOOD Satchel DR, Archer RAMAN, Price M, Whisenant B, Sievert H, Doshi S, Huber K, Reddy V. Prospective randomized evaluation of the Watchman left atrial appendage Device in patients with atrial fibrillation versus long-term warfarin therapy; the PREVAIL trial. Journal of the [employer] of Cardiology, Vol. 4, No. 1, 2014, 1-11. - Kar S, Doshi SK, Sadhu A, Horton R, Osorio J et al.  Primary outcome evaluation of a next-generation left atrial appendage closure device: results from the PINNACLE FLX trial. Circulation 2021;143(18)1754-1762.    After today's visit with the patient which was dedicated solely for shared decision making visit regarding LAA closure device, the patient decided to proceed with the LAA appendage closure procedure scheduled to be done in the near future at [facility]. Prior to the procedure, I would like to obtain a gated CT scan of the chest with contrast timed for PV/LA visualization.    HAS-BLED score 3 Hypertension Yes  Abnormal renal and liver function (Dialysis, transplant, Cr >2.26 mg/dL /Cirrhosis or Bilirubin >2x Normal or AST/ALT/AP >3x Normal) No  Stroke No  Bleeding Yes  Labile INR (Unstable/high INR) No  Elderly (>65) Yes  Drugs or alcohol (>= 8 drinks/week, anti-plt or NSAID) No   CHA2DS2-VASc Score = 5  The patient's score is based upon: CHF History: 1 HTN History: 1 Diabetes History: 0 Stroke History: 0 Vascular Disease History: 1 Age Score: 2 Gender Score: 0  We will set an appointment with Kate in clinic for 3 months from now as a backstop.  If he receives clearance prior to that appointment, can proceed with scheduling the watchman implant.    Signed, Ole DASEN. Cindie, MD, [facility], [facility] 08/28/2023 3:01 PM    Electrophysiology [facility]

## 2023-08-30 ENCOUNTER — Telehealth: Payer: Self-pay

## 2023-08-30 NOTE — Telephone Encounter (Signed)
 The patient's wife reports he is scheduled for Neuro consult on 09/25/2023. She will call after that visit for an update and so records can be requested. Also scheduled the patient for visit with Jill McDaniel 12/10/2023. Ms. Och was grateful for call and agreed with plan.

## 2023-08-30 NOTE — Telephone Encounter (Signed)
 Per Dr. Lalla Brothers, called to make 3 month visit with [name].  Left message to call back.

## 2023-09-03 DIAGNOSIS — D100 Benign neoplasm of lip: Secondary | ICD-10-CM | POA: Diagnosis not present

## 2023-09-03 DIAGNOSIS — L738 Other specified follicular disorders: Secondary | ICD-10-CM | POA: Diagnosis not present

## 2023-09-05 ENCOUNTER — Telehealth: Payer: Self-pay | Admitting: Cardiology

## 2023-09-05 MED ORDER — METOPROLOL SUCCINATE ER 50 MG PO TB24: 50.0000 mg | ORAL_TABLET | Freq: Two times a day (BID) | ORAL | 3 refills | Status: DC

## 2023-09-05 MED ORDER — METOPROLOL SUCCINATE ER 50 MG PO TB24: 50.0000 mg | ORAL_TABLET | Freq: Every day | ORAL | 3 refills | Status: DC

## 2023-09-05 NOTE — Telephone Encounter (Signed)
 Pt's wife, Selena Batten, [place] on file, aware for pt to increase the Toprol XL to 50 mg twice a day.  She will use the XL 25 mg taking 2 twice a day then pick up the new prescription.

## 2023-09-05 NOTE — Telephone Encounter (Signed)
 Spoke with [place] patient's wife per DPR and she states she was advised to call with SBP's over 140. She states some days its below 140 and its fine. These are his BP since increasing metoprolol  XL. He is asymptomatic. ED precautions discussed   12/02 145/93 hr 121 12/9 149/94 hr 83 12/12 148/87 hr 94 12/14 152/99 hr 103 12/15 147/76 hr 86 12/22 142/83 hr 89 12/23 145/85 hr 77 12/25 145/76 hr 138 12/26 164/151 hr 127 12/29 144/80 hr 71 01/01 151/123 hr 109 01/02 158/106 hr 144 01/05 143/105 hr 77 01/07 148/86 hr 89

## 2023-09-05 NOTE — Addendum Note (Signed)
 Addended by: Burnetta Sabin on: 09/05/2023 11:03 AM   Modules accepted: Orders

## 2023-09-05 NOTE — Telephone Encounter (Signed)
 Increase Metoprolol succinate to 50 mg twice daily. Tereso Newcomer, PA-C    09/05/2023 10:23 AM

## 2023-09-05 NOTE — Telephone Encounter (Signed)
 Pt's spouse stated she was told to callback regarding pt's bp so she's requesting a callback from nurse. Please advise

## 2023-09-06 ENCOUNTER — Telehealth: Payer: Self-pay | Admitting: Cardiology

## 2023-09-06 NOTE — Telephone Encounter (Signed)
 Patient's wife is requesting to speak with Elliot Cousin in regards to the 09/06/23 phone encounter. Please advise.

## 2023-09-06 NOTE — Telephone Encounter (Signed)
 Spoke with patient's wife Shawnee (OK per DPR). Patient started increased dose of Toprol  XL 50mg  BID last night. Readings last night: BP 120/58, HR 95.  Discussed parameters for holding Toprol , hold if BP <100/60 or HR <50. Call if BP <90/50 or HR <50, or if symptomatic with dizziness, fatigue, SOB.  [place] verbalized understanding and expressed appreciation for call.

## 2023-09-20 ENCOUNTER — Ambulatory Visit (HOSPITAL_COMMUNITY)
Admission: RE | Admit: 2023-09-20 | Discharge: 2023-09-20 | Disposition: A | Discharge status: Home / Self Care | Payer: Medicare Other | Source: Ambulatory Visit | Attending: Internal Medicine | Admitting: Internal Medicine

## 2023-09-20 DIAGNOSIS — I6523 Occlusion and stenosis of bilateral carotid arteries: Secondary | ICD-10-CM | POA: Diagnosis not present

## 2023-09-21 ENCOUNTER — Other Ambulatory Visit: Payer: Self-pay | Admitting: *Deleted

## 2023-09-21 DIAGNOSIS — I6523 Occlusion and stenosis of bilateral carotid arteries: Secondary | ICD-10-CM

## 2023-09-25 ENCOUNTER — Other Ambulatory Visit (HOSPITAL_COMMUNITY): Payer: Self-pay | Admitting: Neurosurgery

## 2023-09-25 DIAGNOSIS — J069 Acute upper respiratory infection, unspecified: Secondary | ICD-10-CM | POA: Diagnosis not present

## 2023-09-25 DIAGNOSIS — I6203 Nontraumatic chronic subdural hemorrhage: Secondary | ICD-10-CM

## 2023-09-25 DIAGNOSIS — M4807 Spinal stenosis, lumbosacral region: Secondary | ICD-10-CM | POA: Diagnosis not present

## 2023-09-25 DIAGNOSIS — Z87891 Personal history of nicotine dependence: Secondary | ICD-10-CM | POA: Diagnosis not present

## 2023-09-25 NOTE — Telephone Encounter (Signed)
The patient's wife called today for an update. He saw Neuro and they will be scheduling a repeat head CT and referred him for PT. Will follow along for CT results. The patient is scheduled with Dr. Lalla Brothers 12/24/2023.

## 2023-10-01 ENCOUNTER — Telehealth: Payer: Self-pay | Admitting: Cardiology

## 2023-10-01 NOTE — Telephone Encounter (Signed)
Spoke with wife who is asking about pt's potassium supplement.  Advised no change in pt's medications unless he has had blood work at another MD office and he was instructed to discontinue it.  She states understanding.   She is also reporting pt has had a bad head cold and has seen his PCP.  She reports he has been taking Coricidin and mucinex and the PCP gave him a cough medication.  Advised these are all OK for him to continue.  She reports he has been coughing a lot and pt reports it is a dry cough with no production.  Advised to use saline nasal spray or wash to help thin out mucous in his sinuses and help prevent drainage causing him to cough.  Wife is also checking his BP which she reports is elevated at times.  She will continue to monitor it and call back if any further concerns.

## 2023-10-01 NOTE — Telephone Encounter (Signed)
Pt c/o medication issue:  1. Name of Medication:   potassium chloride SA (KLOR-CON M) 10 MEQ tablet    2. How are you currently taking this medication (dosage and times per day)? Take 2 tablets (20 mEq total) by mouth daily.   3. Are you having a reaction (difficulty breathing--STAT)? No  4. What is your medication issue? Patient's spouse is calling to verify if Dr. Anne Fu would like the patient to continue taking this medication. Please advise.

## 2023-10-02 ENCOUNTER — Encounter (HOSPITAL_BASED_OUTPATIENT_CLINIC_OR_DEPARTMENT_OTHER): Payer: Self-pay

## 2023-10-03 ENCOUNTER — Telehealth: Payer: Self-pay | Admitting: Cardiology

## 2023-10-03 DIAGNOSIS — E782 Mixed hyperlipidemia: Secondary | ICD-10-CM | POA: Diagnosis not present

## 2023-10-03 DIAGNOSIS — E1169 Type 2 diabetes mellitus with other specified complication: Secondary | ICD-10-CM | POA: Diagnosis not present

## 2023-10-03 NOTE — Telephone Encounter (Signed)
 New Message:      Please call Amalia Badder. She said patient was just seen by them on 09-25-23.

## 2023-10-03 NOTE — Telephone Encounter (Signed)
 Follow Up:   [name] said they had received a referral on this patient. She said he was seen by them on 09-25-23.

## 2023-10-04 LAB — LAB REPORT - SCANNED
A1c: 6.8
Albumin, Urine POC: 37.7
Albumin/Creatinine Ratio, Urine, POC: 38
Creatinine, POC: 98 mg/dL
EGFR: 56

## 2023-10-08 ENCOUNTER — Ambulatory Visit (HOSPITAL_COMMUNITY)
Admission: RE | Admit: 2023-10-08 | Discharge: 2023-10-08 | Disposition: A | Discharge status: Home / Self Care | Payer: Medicare Other | Source: Ambulatory Visit | Attending: Neurosurgery | Admitting: Neurosurgery

## 2023-10-08 DIAGNOSIS — I480 Paroxysmal atrial fibrillation: Secondary | ICD-10-CM | POA: Diagnosis not present

## 2023-10-08 DIAGNOSIS — N1831 Chronic kidney disease, stage 3a: Secondary | ICD-10-CM | POA: Insufficient documentation

## 2023-10-08 DIAGNOSIS — I5023 Acute on chronic systolic (congestive) heart failure: Secondary | ICD-10-CM | POA: Diagnosis not present

## 2023-10-08 DIAGNOSIS — I6529 Occlusion and stenosis of unspecified carotid artery: Secondary | ICD-10-CM | POA: Diagnosis not present

## 2023-10-08 DIAGNOSIS — I6203 Nontraumatic chronic subdural hemorrhage: Secondary | ICD-10-CM | POA: Diagnosis not present

## 2023-10-08 DIAGNOSIS — I130 Hypertensive heart and chronic kidney disease with heart failure and stage 1 through stage 4 chronic kidney disease, or unspecified chronic kidney disease: Secondary | ICD-10-CM | POA: Diagnosis not present

## 2023-10-08 DIAGNOSIS — E1122 Type 2 diabetes mellitus with diabetic chronic kidney disease: Secondary | ICD-10-CM | POA: Diagnosis not present

## 2023-10-08 DIAGNOSIS — E782 Mixed hyperlipidemia: Secondary | ICD-10-CM | POA: Diagnosis not present

## 2023-10-08 DIAGNOSIS — F411 Generalized anxiety disorder: Secondary | ICD-10-CM | POA: Diagnosis not present

## 2023-10-08 DIAGNOSIS — I25810 Atherosclerosis of coronary artery bypass graft(s) without angina pectoris: Secondary | ICD-10-CM | POA: Diagnosis not present

## 2023-10-08 DIAGNOSIS — D649 Anemia, unspecified: Secondary | ICD-10-CM | POA: Diagnosis not present

## 2023-10-08 DIAGNOSIS — I6782 Cerebral ischemia: Secondary | ICD-10-CM | POA: Diagnosis not present

## 2023-10-08 DIAGNOSIS — S065X0D Traumatic subdural hemorrhage without loss of consciousness, subsequent encounter: Secondary | ICD-10-CM | POA: Diagnosis not present

## 2023-10-08 DIAGNOSIS — E1169 Type 2 diabetes mellitus with other specified complication: Secondary | ICD-10-CM | POA: Diagnosis not present

## 2023-10-09 NOTE — Telephone Encounter (Signed)
[name] with [facility] calling back

## 2023-10-09 NOTE — Telephone Encounter (Signed)
Left message for Darl Pikes to call back.

## 2023-10-09 NOTE — Telephone Encounter (Signed)
Spoke with Darl Pikes at [facility]. She states that the patient had an appointment on 1/28. A CT scan was ordered and completed. The patient needs a follow up appointment with their office to discuss results and determine whether or not he is safe to restart anticoagulation. She will provide an update after his next appointment - they are waiting for him to return their call to schedule.

## 2023-10-16 ENCOUNTER — Ambulatory Visit (HOSPITAL_COMMUNITY): Payer: Medicare Other | Admitting: Physical Therapy

## 2023-10-16 DIAGNOSIS — I6203 Nontraumatic chronic subdural hemorrhage: Secondary | ICD-10-CM | POA: Diagnosis not present

## 2023-10-30 ENCOUNTER — Ambulatory Visit (HOSPITAL_COMMUNITY): Payer: Medicare Other | Attending: Neurosurgery

## 2023-10-30 ENCOUNTER — Other Ambulatory Visit: Payer: Self-pay

## 2023-10-30 DIAGNOSIS — R262 Difficulty in walking, not elsewhere classified: Secondary | ICD-10-CM | POA: Insufficient documentation

## 2023-10-30 DIAGNOSIS — R29898 Other symptoms and signs involving the musculoskeletal system: Secondary | ICD-10-CM | POA: Diagnosis not present

## 2023-10-30 NOTE — Therapy (Signed)
 OUTPATIENT PHYSICAL THERAPY THORACOLUMBAR EVALUATION   Patient Name: [name] MRN: [id number] DOB:1946/06/28, 78 y.o., male Today's Date: 10/30/2023  END OF SESSION:  PT End of Session - 10/30/23 1008     Visit Number 1    Number of Visits 8    Date for PT Re-Evaluation 11/27/23    Authorization Type Medicare Part A/B    PT Start Time 0930    PT Stop Time 1010    PT Time Calculation (min) 40 min    Equipment Utilized During Treatment Gait belt    Activity Tolerance Patient tolerated treatment well    Behavior During Therapy [facility] for tasks assessed/performed             Past Medical History:  Diagnosis Date   AAA (abdominal aortic aneurysm) (HCC)    06/27/20 MRI: 3.0 cm infrarenal AAA, recommend 3 year follow-up   Anxiety    Atrial fibrillation (HCC)    Bilateral carotid artery disease (HCC) 07/21/2013   CAD (coronary artery disease) 2008   CABG   COPD (chronic obstructive pulmonary disease) (HCC)    Dysrhythmia    A-fib   Gastritis    Headache(784.0)    History of hiatal hernia    Hypertension    SDH (subdural hematoma) (HCC) 01/31/2023   Visit for monitoring Tikosyn therapy 12/2015   Past Surgical History:  Procedure Laterality Date   BURR HOLE Left 03/27/2023   Procedure: BURR HOLE EVASCUATION OF SUBDURAL HEMATOMA;  Surgeon: Jadene Pierini, MD;  Location: [facility] OR;  Service: Neurosurgery;  Laterality: Left;   CARDIOVERSION N/A 09/08/2015   Procedure: CARDIOVERSION;  Surgeon: Vesta Mixer, MD;  Location: [facility] ENDOSCOPY;  Service: Cardiovascular;  Laterality: N/A;   CORONARY ARTERY BYPASS GRAFT     EXCISION OF KELOID Left 10/08/2018   Procedure: EXCISION OF CHRONIC ABDOMINAL WALL WOUND;  Surgeon: Abigail Miyamoto, MD;  Location: [facility];  Service: General;  Laterality: Left;   INCISION AND DRAINAGE DEEP NECK ABSCESS     IR ANGIO EXTERNAL CAROTID SEL EXT CAROTID UNI L MOD SED  04/17/2023   IR ANGIO INTRA EXTRACRAN SEL COM CAROTID INNOMINATE  UNI L MOD SED  04/17/2023   IR NEURO EACH ADD'L AFTER BASIC UNI LEFT (MS)  04/17/2023   LUMBAR LAMINECTOMY/DECOMPRESSION MICRODISCECTOMY Right 08/09/2020   Procedure: Laminectomy and Foraminotomy - Lumbar three-Lumbar four - Lumbar four-Lumbar five- right;  Surgeon: Donalee Citrin, MD;  Location: [facility] OR;  Service: Neurosurgery;  Laterality: Right;   RADIOLOGY WITH ANESTHESIA N/A 04/17/2023   Procedure: Left MMA;  Surgeon: Lisbeth Renshaw, MD;  Location: [facility] OR;  Service: Radiology;  Laterality: N/A;   ROTATOR CUFF REPAIR Bilateral    TEE WITHOUT CARDIOVERSION N/A 08/02/2015   Procedure: TRANSESOPHAGEAL ECHOCARDIOGRAM (TEE);  Surgeon: Quintella Reichert, MD;  Location: [facility] ENDOSCOPY;  Service: Cardiovascular;  Laterality: N/A;   TEE WITHOUT CARDIOVERSION N/A 09/08/2015   Procedure: TRANSESOPHAGEAL ECHOCARDIOGRAM (TEE);  Surgeon: Vesta Mixer, MD;  Location: [facility] END[facility]COPY;  Service: Cardiovascular;  Laterality: N/A;   Patient Active Problem List   Diagnosis Date Noted   Fluid overload 05/12/2023   CHF (congestive heart failure) (HCC) 05/12/2023   Heart failure with improved ejection fraction (HFimpEF) (HCC) 04/24/2023   Lower extremity edema 04/24/2023   Cerebral aneurysm 04/17/2023   COVID-19 04/03/2023   Right sided weakness 04/03/2023   Abnormal gait 03/27/2023   Subdural hematoma (HCC) 03/26/2023   Head injury 02/13/2023   Fall 02/13/2023   Subdural hematoma  due to concussion (HCC) 02/13/2023   Paroxysmal atrial fibrillation (HCC) 09/15/2021   Hypercoagulable state due to paroxysmal atrial fibrillation (HCC) 09/15/2021   AAA (abdominal aortic aneurysm) without rupture (HCC) 09/15/2021   History of bradycardia 10/21/2020   Spinal stenosis at L4-L5 level 08/09/2020   Insomnia 02/06/2018   Visit for monitoring Tikosyn therapy 01/10/2016   Atrial flutter (HCC) 08/07/2015   Bilateral carotid artery disease (HCC) 07/21/2013   CAD (coronary artery disease) 01/26/2013   Chronic obstructive  pulmonary disease (HCC) 01/26/2013   Essential hypertension, benign 01/26/2013   Hyperlipidemia 01/26/2013    PCP: Benita Stabile, MD  REFERRING PROVIDER: Donalee Citrin, MD  REFERRING DIAG: M48.07 (ICD-10-CM) - Spinal stenosis of lumbosacral region  Rationale for Evaluation and Treatment: Rehabilitation  THERAPY DIAG:  Weakness of right lower extremity  Difficulty in walking, not elsewhere classified  ONSET DATE: July 2024  SUBJECTIVE:                                                                                                                                                                                           SUBJECTIVE STATEMENT: EVAL: Arrives to the clinic with c/o L LE weakness. Denies any numbness on the legs. Patient reports that he doesn't have any strength on the legs. Due to the problem, patient states that it is difficult to squat. Denies worsening of leg weakness but the weakness can be more felt when he gets physically active. Fell January 31, 2023. Had L brain surgery on July 2024 due to bleeding in the brain. Weakness on the legs started since he had the brain surgery in July 2024. Patient denies any imaging on the legs or medications to address the issues. Patient was then referred to outpatient PT evaluation and management.   PERTINENT HISTORY:  Lumbar laminectomy 2021, L brain surgery  PAIN:  Are you having pain? No  PRECAUTIONS: Fall  RED FLAGS: None   WEIGHT BEARING RESTRICTIONS: No  FALLS:  Has patient fallen in last 6 months? No  LIVING ENVIRONMENT: Lives with: lives with their spouse Lives in: House/apartment Stairs: Yes: External: 3 steps; bilateral but cannot reach both Has following equipment at home: Dan Humphreys - 4 wheeled, shower chair, and Shower bench  OCCUPATION: retired  PLOF: Independent and Independent with basic ADLs  PATIENT GOALS: "to try to see if the left leg is going to get stronger"  NEXT MD VISIT: April 2025  OBJECTIVE:   Note: Objective measures were completed at Evaluation unless otherwise noted.  DIAGNOSTIC FINDINGS:  None performed relevant to the area/complaint recently   PATIENT SURVEYS:  ABC scale 1040/1600 = 65%  COGNITION: Overall cognitive status: Within functional limits for tasks assessed     SENSATION: Light touch: WFL on B LE  MUSCLE LENGTH: Hamstrings: moderate restriction on B Thomas test: mild restriction on B Piriformis test: mild restriction on B  POSTURE: rounded shoulders, forward head, decreased lumbar lordosis, increased thoracic kyphosis, posterior pelvic tilt, and knees slightly flexed  LUMBAR ROM:   AROM eval  Flexion 50%  Extension 5%  Right lateral flexion   Left lateral flexion   Right rotation 75%  Left rotation 50%   (Blank rows = not tested)  LOWER EXTREMITY ROM:     Active  Right eval Left eval  Hip flexion [facility]  Hip extension [facility]  Hip abduction [facility]  Hip adduction    Hip internal rotation    Hip external rotation    Knee flexion [facility] WFL  Knee extension [facility]  Ankle dorsiflexion [facility] WFL  Ankle plantarflexion [facility] WFL  Ankle inversion    Ankle eversion     (Blank rows = not tested)  LOWER EXTREMITY MMT:    MMT Right eval Left eval  Hip flexion 4 4  Hip extension 4- 4  Hip abduction 3+ 4  Hip adduction    Hip internal rotation    Hip external rotation    Knee flexion 3+ 4  Knee extension 4- 4  Ankle dorsiflexion 3+ 4  Ankle plantarflexion    Ankle inversion    Ankle eversion     (Blank rows = not tested)  LUMBAR SPECIAL TESTS:  Straight leg raise test: positive for hamstring tightness  FUNCTIONAL TESTS:  5 times sit to stand: 11.41 sec 2 minute walk test: 464 ft Dynamic Gait Index: 14  DGI 1. Gait level surface (2) Mild Impairment: Walks 20', uses assistive devices, slower speed, mild gait deviations. 2. Change in gait speed (2) Mild Impairment: Is able to change speed but demonstrates mild gait deviations, or  not gait deviations but unable to achieve a significant change in velocity, or uses an assistive device. 3. Gait with horizontal head turns (1) Moderate Impairment: Performs head turns with moderate change in gait velocity, slows down, staggers but recovers, can continue to walk. 4. Gait with vertical head turns (2) Mild Impairment: Performs head turns smoothly with slight change in gait velocity, i.e., minor disruption to smooth gait path or uses walking aid. 5. Gait and pivot turn (2) Mild Impairment: Pivot turns safely in > 3 seconds and stops with no loss of balance. 6. Step over obstacle (2) Mild Impairment: Is able to step over box, but must slow down and adjust steps to clear box safely. 7. Step around obstacles (1) Moderate Impairment: Is able to clear cones but must significantly slow, speed to accomplish task, or requires verbal cueing. 8. Stairs (2) Mild Impairment: Alternating feet, must use rail.  TOTAL SCORE: 14 / 24  GAIT: Distance walked: 464 ft Assistive device utilized: None Level of assistance: Complete Independence Comments: done during , slight foot slap on R  TREATMENT DATE:  10/30/23 Evaluation and patient education done  PATIENT EDUCATION:  Education details: Educated on the goals and course of rehab. Educated on measures to reduce falls at home. Person educated: Patient Education method: Explanation Education comprehension: verbalized understanding  HOME EXERCISE PROGRAM: None provided to date  ASSESSMENT:  CLINICAL IMPRESSION: EVAL: Patient is a 78 y.o. male who was seen today for physical therapy evaluation and treatment for lumbar stenosis. Patient was diagnosed with lumbar by referring provider further defined by difficulty with squatting due to weakness, impaired balance, and decreased soft tissue extensibility. Patient  reports that his L LE is weak but per objective findings, patient's R LE is weaker than the L which could be attributed from surgery on the L side of the brain. Skilled PT is required to address the impairments and functional limitations listed below.    OBJECTIVE IMPAIRMENTS: Abnormal gait, decreased activity tolerance, decreased balance, difficulty walking, decreased ROM, decreased strength, and impaired flexibility.   ACTIVITY LIMITATIONS: carrying, lifting, bending, standing, squatting, and stairs  PARTICIPATION LIMITATIONS: meal prep, cleaning, laundry, driving, shopping, community activity, and yard work  PERSONAL FACTORS: Age and Time since onset of injury/illness/exacerbation are also affecting patient's functional outcome.   REHAB POTENTIAL: Fair    CLINICAL DECISION MAKING: Stable/uncomplicated  EVALUATION COMPLEXITY: Low   GOALS: Goals reviewed with patient? Yes  SHORT TERM GOALS: Target date: 11/13/23  Pt will demonstrate indep in HEP to facilitate carry-over of skilled services and improve functional outcomes Goal status: INITIAL  LONG TERM GOALS: Target date: 11/27/23  Pt will improve ABC scale score by 10% in order to demonstrate improved confidence with balance and safety during ambulation and other ADLs  Baseline: 65% Goal status: INITIAL  2.  Pt will increase by at least 40 ft in order to demonstrate clinically significant improvement in community ambulation Baseline: 464 ft Goal status: INITIAL  3.  Pt will improve DGI by at least 3 points in order to demonstrate clinically significant improvement in balance and decreased risk for falls Baseline: 14 Goal status: INITIAL  4.  Pt will demonstrate increase in LE strength to 4-/5 to facilitate ease and safety in ambulation Baseline: 3+/5 Goal status: INITIAL  PLAN:  PT FREQUENCY: 2x/week  PT DURATION: 4 weeks  PLANNED INTERVENTIONS: 97164- PT Re-evaluation, 97110-Therapeutic exercises, 97530-  Therapeutic activity, [id number]- Neuromuscular re-education, 97535- Self Care, [id number]- Manual therapy, [phone]- Gait training, Patient/Family education, and Stair training.  PLAN FOR NEXT SESSION: Provide HEP. Begin LE strengthening, flexibility, balance, and gait activities.   Tish Frederickson. Chipper Koudelka, PT, DPT, OCS Board-Certified Clinical Specialist in Orthopedic PT PT Compact Privilege # ([place]): [id number] T 10/30/2023, 10:10 AM

## 2023-10-30 NOTE — Telephone Encounter (Signed)
 Per note from 2/18 with [facility]: Progress Note  Progress Note Clinical Note Date  78 year old gentleman status post subdural hematoma procedure back UA shin follow-up CT scan shows complete resolution out separate very small amount of hygroma this fluid left with minimal to no mass effect I think it is okay for him to proceed for with his cardiac procedure and as the day need to they determined he needs anticoagulation I think it is okay to do that certainly at a slightly higher risk of recurrent subdural than someone who has never had 1 but at this point his cardiac issues are probably more critical to be addressed.

## 2023-11-01 ENCOUNTER — Ambulatory Visit (HOSPITAL_COMMUNITY)

## 2023-11-01 ENCOUNTER — Encounter (HOSPITAL_COMMUNITY): Payer: Self-pay

## 2023-11-01 DIAGNOSIS — R29898 Other symptoms and signs involving the musculoskeletal system: Secondary | ICD-10-CM | POA: Diagnosis not present

## 2023-11-01 DIAGNOSIS — R262 Difficulty in walking, not elsewhere classified: Secondary | ICD-10-CM

## 2023-11-01 NOTE — Therapy (Signed)
 OUTPATIENT PHYSICAL THERAPY THORACOLUMBAR TREATMENT   Patient Name: [name] MRN: [id number] DOB:1946-05-07, 78 y.o., male Today's Date: 11/01/2023  END OF SESSION:  PT End of Session - 11/01/23 1101     Visit Number 2    Number of Visits 8    Date for PT Re-Evaluation 11/27/23    Authorization Type Medicare Part A/B    PT Start Time 1102    PT Stop Time 1143    PT Time Calculation (min) 41 min    Activity Tolerance Patient tolerated treatment well    Behavior During Therapy [facility] for tasks assessed/performed             Past Medical History:  Diagnosis Date   AAA (abdominal aortic aneurysm) (HCC)    06/27/20 MRI: 3.0 cm infrarenal AAA, recommend 3 year follow-up   Anxiety    Atrial fibrillation (HCC)    Bilateral carotid artery disease (HCC) 07/21/2013   CAD (coronary artery disease) 2008   CABG   COPD (chronic obstructive pulmonary disease) (HCC)    Dysrhythmia    A-fib   Gastritis    Headache(784.0)    History of hiatal hernia    Hypertension    SDH (subdural hematoma) (HCC) 01/31/2023   Visit for monitoring Tikosyn therapy 12/2015   Past Surgical History:  Procedure Laterality Date   BURR HOLE Left 03/27/2023   Procedure: BURR HOLE EVASCUATION OF SUBDURAL HEMATOMA;  Surgeon: Jadene Pierini, MD;  Location: [facility] OR;  Service: Neurosurgery;  Laterality: Left;   CARDIOVERSION N/A 09/08/2015   Procedure: CARDIOVERSION;  Surgeon: Vesta Mixer, MD;  Location: [facility] ENDOSCOPY;  Service: Cardiovascular;  Laterality: N/A;   CORONARY ARTERY BYPASS GRAFT     EXCISION OF KELOID Left 10/08/2018   Procedure: EXCISION OF CHRONIC ABDOMINAL WALL WOUND;  Surgeon: Abigail Miyamoto, MD;  Location: [facility];  Service: General;  Laterality: Left;   INCISION AND DRAINAGE DEEP NECK ABSCESS     IR ANGIO EXTERNAL CAROTID SEL EXT CAROTID UNI L MOD SED  04/17/2023   IR ANGIO INTRA EXTRACRAN SEL COM CAROTID INNOMINATE UNI L MOD SED  04/17/2023   IR NEURO EACH ADD'L  AFTER BASIC UNI LEFT (MS)  04/17/2023   LUMBAR LAMINECTOMY/DECOMPRESSION MICRODISCECTOMY Right 08/09/2020   Procedure: Laminectomy and Foraminotomy - Lumbar three-Lumbar four - Lumbar four-Lumbar five- right;  Surgeon: Donalee Citrin, MD;  Location: [facility] OR;  Service: Neurosurgery;  Laterality: Right;   RADIOLOGY WITH ANESTHESIA N/A 04/17/2023   Procedure: Left MMA;  Surgeon: Lisbeth Renshaw, MD;  Location: [facility] OR;  Service: Radiology;  Laterality: N/A;   ROTATOR CUFF REPAIR Bilateral    TEE WITHOUT CARDIOVERSION N/A 08/02/2015   Procedure: TRANSESOPHAGEAL ECHOCARDIOGRAM (TEE);  Surgeon: Quintella Reichert, MD;  Location: [facility] ENDOSCOPY;  Service: Cardiovascular;  Laterality: N/A;   TEE WITHOUT CARDIOVERSION N/A 09/08/2015   Procedure: TRANSESOPHAGEAL ECHOCARDIOGRAM (TEE);  Surgeon: Vesta Mixer, MD;  Location: [facility] ENDOSCOPY;  Service: Cardiovascular;  Laterality: N/A;   Patient Active Problem List   Diagnosis Date Noted   Fluid overload 05/12/2023   CHF (congestive heart failure) (HCC) 05/12/2023   Heart failure with improved ejection fraction (HFimpEF) (HCC) 04/24/2023   Lower extremity edema 04/24/2023   Cerebral aneurysm 04/17/2023   COVID-19 04/03/2023   Right sided weakness 04/03/2023   Abnormal gait 03/27/2023   Subdural hematoma (HCC) 03/26/2023   Head injury 02/13/2023   Fall 02/13/2023   Subdural hematoma due to concussion (HCC) 02/13/2023   Paroxysmal atrial  fibrillation (HCC) 09/15/2021   Hypercoagulable state due to paroxysmal atrial fibrillation (HCC) 09/15/2021   AAA (abdominal aortic aneurysm) without rupture (HCC) 09/15/2021   History of bradycardia 10/21/2020   Spinal stenosis at L4-L5 level 08/09/2020   Insomnia 02/06/2018   Visit for monitoring Tikosyn therapy 01/10/2016   Atrial flutter (HCC) 08/07/2015   Bilateral carotid artery disease (HCC) 07/21/2013   CAD (coronary artery disease) 01/26/2013   Chronic obstructive pulmonary disease (HCC) 01/26/2013   Essential  hypertension, benign 01/26/2013   Hyperlipidemia 01/26/2013    PCP: Benita Stabile, MD  REFERRING PROVIDER: Donalee Citrin, MD  REFERRING DIAG: M48.07 (ICD-10-CM) - Spinal stenosis of lumbosacral region  Rationale for Evaluation and Treatment: Rehabilitation  THERAPY DIAG:  Weakness of right lower extremity  Difficulty in walking, not elsewhere classified  ONSET DATE: July 2024  SUBJECTIVE:                                                                                                                                                                                           SUBJECTIVE STATEMENT: 11/01/23:  Pt reports he is pain free today.  Stated legs feel weak.  EVAL: Arrives to the clinic with c/o L LE weakness. Denies any numbness on the legs. Patient reports that he doesn't have any strength on the legs. Due to the problem, patient states that it is difficult to squat. Denies worsening of leg weakness but the weakness can be more felt when he gets physically active. Fell January 31, 2023. Had L brain surgery on July 2024 due to bleeding in the brain. Weakness on the legs started since he had the brain surgery in July 2024. Patient denies any imaging on the legs or medications to address the issues. Patient was then referred to outpatient PT evaluation and management.   PERTINENT HISTORY:  Lumbar laminectomy 2021, L brain surgery  PAIN:  Are you having pain? No  PRECAUTIONS: Fall  RED FLAGS: None   WEIGHT BEARING RESTRICTIONS: No  FALLS:  Has patient fallen in last 6 months? No  LIVING ENVIRONMENT: Lives with: lives with their spouse Lives in: House/apartment Stairs: Yes: External: 3 steps; bilateral but cannot reach both Has following equipment at home: Dan Humphreys - 4 wheeled, shower chair, and Shower bench  OCCUPATION: retired  PLOF: Independent and Independent with basic ADLs  PATIENT GOALS: "to try to see if the left leg is going to get stronger"  NEXT MD VISIT: April  2025  OBJECTIVE:  Note: Objective measures were completed at Evaluation unless otherwise noted.  DIAGNOSTIC FINDINGS:  None performed relevant to the area/complaint recently   PATIENT SURVEYS:  ABC scale 1040/1600 = 65%  COGNITION: Overall cognitive status: Within functional limits for tasks assessed     SENSATION: Light touch: WFL on B LE  MUSCLE LENGTH: Hamstrings: moderate restriction on B Thomas test: mild restriction on B Piriformis test: mild restriction on B  POSTURE: rounded shoulders, forward head, decreased lumbar lordosis, increased thoracic kyphosis, posterior pelvic tilt, and knees slightly flexed  LUMBAR ROM:   AROM eval  Flexion 50%  Extension 5%  Right lateral flexion   Left lateral flexion   Right rotation 75%  Left rotation 50%   (Blank rows = not tested)  LOWER EXTREMITY ROM:     Active  Right eval Left eval  Hip flexion [facility]  Hip extension [facility]  Hip abduction [facility]  Hip adduction    Hip internal rotation    Hip external rotation    Knee flexion [name] WFL  Knee extension [facility]  Ankle dorsiflexion [facility] WFL  Ankle plantarflexion [facility] WFL  Ankle inversion    Ankle eversion     (Blank rows = not tested)  LOWER EXTREMITY MMT:    MMT Right eval Left eval  Hip flexion 4 4  Hip extension 4- 4  Hip abduction 3+ 4  Hip adduction    Hip internal rotation    Hip external rotation    Knee flexion 3+ 4  Knee extension 4- 4  Ankle dorsiflexion 3+ 4  Ankle plantarflexion    Ankle inversion    Ankle eversion     (Blank rows = not tested)  LUMBAR SPECIAL TESTS:  Straight leg raise test: positive for hamstring tightness  FUNCTIONAL TESTS:  5 times sit to stand: 11.41 sec 2 minute walk test: 464 ft Dynamic Gait Index: 14  DGI 1. Gait level surface (2) Mild Impairment: Walks 20', uses assistive devices, slower speed, mild gait deviations. 2. Change in gait speed (2) Mild Impairment: Is able to change speed but demonstrates mild  gait deviations, or not gait deviations but unable to achieve a significant change in velocity, or uses an assistive device. 3. Gait with horizontal head turns (1) Moderate Impairment: Performs head turns with moderate change in gait velocity, slows down, staggers but recovers, can continue to walk. 4. Gait with vertical head turns (2) Mild Impairment: Performs head turns smoothly with slight change in gait velocity, i.e., minor disruption to smooth gait path or uses walking aid. 5. Gait and pivot turn (2) Mild Impairment: Pivot turns safely in > 3 seconds and stops with no loss of balance. 6. Step over obstacle (2) Mild Impairment: Is able to step over box, but must slow down and adjust steps to clear box safely. 7. Step around obstacles (1) Moderate Impairment: Is able to clear cones but must significantly slow, speed to accomplish task, or requires verbal cueing. 8. Stairs (2) Mild Impairment: Alternating feet, must use rail.  TOTAL SCORE: 14 / 24  GAIT: Distance walked: 464 ft Assistive device utilized: None Level of assistance: Complete Independence Comments: done during , slight foot slap on R  TREATMENT DATE:  11/01/23: Reviewed goals Educated importance of HEP compliance for maximal benefits STS no HHA eccentric control 10x  Supine: Bridge 10x 5" 2 sets  Sidelying: Clam GTB 10x 5"  Standing: Heel raise 10x Toe raise 10x Squat front of chair 10x Tandem stance 2x 30"  10/30/23 Evaluation and patient education done  PATIENT EDUCATION:  Education details: Educated on the goals and course of rehab. Educated on measures to reduce falls at home. Person educated: Patient Education method: Explanation Education comprehension: verbalized understanding  HOME EXERCISE PROGRAM: Access Code: [email or web address] URL: [email or web address] Date:  11/01/2023 Prepared by: Becky Sax  Exercises - Sit to Stand with Hands on Knees  - 2 x daily - 7 x weekly - 2 sets - 10 reps - Supine Bridge  - 1 x daily - 7 x weekly - 3 sets - 10 reps - 5" hold - Clam with Resistance  - 1 x daily - 7 x weekly - 3 sets - 10 reps - 5" hold - Standing Tandem Balance with Counter Support  - 1 x daily - 7 x weekly - 1 sets - 3 reps - 30" hold - Heel Toe Raises with Counter Support  - 1 x daily - 7 x weekly - 3 sets - 10 reps - 5" hold  ASSESSMENT:  CLINICAL IMPRESSION: 11/01/23:  Reviewed goals and educated importance of HEP compliance for maximal benefits.  HEP established this session with focus on gluteal strengthening and static balance training.  Pt able to complete all exercises with good form and no reports of pain.  Encouraged to stand by counter for safety with tandem stance exercise.  EVAL: Patient is a 78 y.o. male who was seen today for physical therapy evaluation and treatment for lumbar stenosis. Patient was diagnosed with lumbar by referring provider further defined by difficulty with squatting due to weakness, impaired balance, and decreased soft tissue extensibility. Patient reports that his L LE is weak but per objective findings, patient's R LE is weaker than the L which could be attributed from surgery on the L side of the brain. Skilled PT is required to address the impairments and functional limitations listed below.    OBJECTIVE IMPAIRMENTS: Abnormal gait, decreased activity tolerance, decreased balance, difficulty walking, decreased ROM, decreased strength, and impaired flexibility.   ACTIVITY LIMITATIONS: carrying, lifting, bending, standing, squatting, and stairs  PARTICIPATION LIMITATIONS: meal prep, cleaning, laundry, driving, shopping, community activity, and yard work  PERSONAL FACTORS: Age and Time since onset of injury/illness/exacerbation are also affecting patient's functional outcome.   REHAB POTENTIAL: Fair    CLINICAL  DECISION MAKING: Stable/uncomplicated  EVALUATION COMPLEXITY: Low   GOALS: Goals reviewed with patient? Yes  SHORT TERM GOALS: Target date: 11/13/23  Pt will demonstrate indep in HEP to facilitate carry-over of skilled services and improve functional outcomes Goal status: INITIAL  LONG TERM GOALS: Target date: 11/27/23  Pt will improve ABC scale score by 10% in order to demonstrate improved confidence with balance and safety during ambulation and other ADLs  Baseline: 65% Goal status: INITIAL  2.  Pt will increase by at least 40 ft in order to demonstrate clinically significant improvement in community ambulation Baseline: 464 ft Goal status: INITIAL  3.  Pt will improve DGI by at least 3 points in order to demonstrate clinically significant improvement in balance and decreased risk for falls Baseline: 14 Goal status: INITIAL  4.  Pt will demonstrate increase in LE strength to 4-/5 to facilitate ease and safety in ambulation Baseline: 3+/5 Goal status: INITIAL  PLAN:  PT FREQUENCY: 2x/week  PT DURATION: 4 weeks  PLANNED INTERVENTIONS: [id number]- PT Re-evaluation, 97110-Therapeutic exercises, 97530- Therapeutic activity, [id number]- Neuromuscular re-education, 97535- Self Care, [id number]- Manual therapy, [phone]- Gait training, Patient/Family education, and Stair training.  PLAN FOR NEXT SESSION: Begin LE strengthening, flexibility, balance, and gait  activities.   Becky Sax, LPTA/CLT; [place] [phone]  11/01/2023, 1:15 PM

## 2023-11-02 ENCOUNTER — Telehealth: Payer: Self-pay | Admitting: Cardiology

## 2023-11-02 NOTE — Telephone Encounter (Signed)
 Spoke with wife Joyce Gross and verified potassium chloride dosage is 20 mEq daily according to our documentation and unless another provider has changed something, he should still be taking 20 mEq daily.  Joyce Gross states understanding and also that she was just making sure because she is getting old and doesn't always remember things correctly.  Reassurance given that she can always call and verify medications/doses as needed.

## 2023-11-02 NOTE — Telephone Encounter (Signed)
 Pt c/o medication issue:  1. Name of Medication: potassium chloride SA (KLOR-CON M) 10 MEQ tablet   2. How are you currently taking this medication (dosage and times per day)? As written   3. Are you having a reaction (difficulty breathing--STAT)? No   4. What is your medication issue? Pt spouse called in asking if pt should still be taking this med at a day

## 2023-11-06 ENCOUNTER — Ambulatory Visit (HOSPITAL_COMMUNITY)

## 2023-11-06 ENCOUNTER — Encounter (HOSPITAL_COMMUNITY): Payer: Self-pay

## 2023-11-06 DIAGNOSIS — R262 Difficulty in walking, not elsewhere classified: Secondary | ICD-10-CM | POA: Diagnosis not present

## 2023-11-06 DIAGNOSIS — R29898 Other symptoms and signs involving the musculoskeletal system: Secondary | ICD-10-CM

## 2023-11-06 NOTE — Therapy (Signed)
 OUTPATIENT PHYSICAL THERAPY THORACOLUMBAR TREATMENT   Patient Name: [name] MRN: [id number] DOB:1946/04/11, 78 y.o., male Today's Date: 11/06/2023  END OF SESSION:  PT End of Session - 11/06/23 1011     Visit Number 3    Number of Visits 8    Date for PT Re-Evaluation 11/27/23    Authorization Type Medicare Part A/B    PT Start Time 0930    PT Stop Time 1011    PT Time Calculation (min) 41 min    Activity Tolerance Patient tolerated treatment well    Behavior During Therapy [facility] for tasks assessed/performed              Past Medical History:  Diagnosis Date   AAA (abdominal aortic aneurysm) (HCC)    06/27/20 MRI: 3.0 cm infrarenal AAA, recommend 3 year follow-up   Anxiety    Atrial fibrillation (HCC)    Bilateral carotid artery disease (HCC) 07/21/2013   CAD (coronary artery disease) 2008   CABG   COPD (chronic obstructive pulmonary disease) (HCC)    Dysrhythmia    A-fib   Gastritis    Headache(784.0)    History of hiatal hernia    Hypertension    SDH (subdural hematoma) (HCC) 01/31/2023   Visit for monitoring Tikosyn therapy 12/2015   Past Surgical History:  Procedure Laterality Date   BURR HOLE Left 03/27/2023   Procedure: BURR HOLE EVASCUATION OF SUBDURAL HEMATOMA;  Surgeon: Jadene Pierini, MD;  Location: [facility] OR;  Service: Neurosurgery;  Laterality: Left;   CARDIOVERSION N/A 09/08/2015   Procedure: CARDIOVERSION;  Surgeon: Vesta Mixer, MD;  Location: [facility] ENDOSCOPY;  Service: Cardiovascular;  Laterality: N/A;   CORONARY ARTERY BYPASS GRAFT     EXCISION OF KELOID Left 10/08/2018   Procedure: EXCISION OF CHRONIC ABDOMINAL WALL WOUND;  Surgeon: Abigail Miyamoto, MD;  Location: [facility];  Service: General;  Laterality: Left;   INCISION AND DRAINAGE DEEP NECK ABSCESS     IR ANGIO EXTERNAL CAROTID SEL EXT CAROTID UNI L MOD SED  04/17/2023   IR ANGIO INTRA EXTRACRAN SEL COM CAROTID INNOMINATE UNI L MOD SED  04/17/2023   IR NEURO EACH ADD'L  AFTER BASIC UNI LEFT (MS)  04/17/2023   LUMBAR LAMINECTOMY/DECOMPRESSION MICRODISCECTOMY Right 08/09/2020   Procedure: Laminectomy and Foraminotomy - Lumbar three-Lumbar four - Lumbar four-Lumbar five- right;  Surgeon: Donalee Citrin, MD;  Location: [facility] OR;  Service: Neurosurgery;  Laterality: Right;   RADIOLOGY WITH ANESTHESIA N/A 04/17/2023   Procedure: Left MMA;  Surgeon: Lisbeth Renshaw, MD;  Location: [facility] OR;  Service: Radiology;  Laterality: N/A;   ROTATOR CUFF REPAIR Bilateral    TEE WITHOUT CARDIOVERSION N/A 08/02/2015   Procedure: TRANSESOPHAGEAL ECHOCARDIOGRAM (TEE);  Surgeon: Quintella Reichert, MD;  Location: [facility] ENDOSCOPY;  Service: Cardiovascular;  Laterality: N/A;   TEE WITHOUT CARDIOVERSION N/A 09/08/2015   Procedure: TRANSESOPHAGEAL ECHOCARDIOGRAM (TEE);  Surgeon: Vesta Mixer, MD;  Location: [facility] ENDOSCOPY;  Service: Cardiovascular;  Laterality: N/A;   Patient Active Problem List   Diagnosis Date Noted   Fluid overload 05/12/2023   CHF (congestive heart failure) (HCC) 05/12/2023   Heart failure with improved ejection fraction (HFimpEF) (HCC) 04/24/2023   Lower extremity edema 04/24/2023   Cerebral aneurysm 04/17/2023   COVID-19 04/03/2023   Right sided weakness 04/03/2023   Abnormal gait 03/27/2023   Subdural hematoma (HCC) 03/26/2023   Head injury 02/13/2023   Fall 02/13/2023   Subdural hematoma due to concussion (HCC) 02/13/2023   Paroxysmal  atrial fibrillation (HCC) 09/15/2021   Hypercoagulable state due to paroxysmal atrial fibrillation (HCC) 09/15/2021   AAA (abdominal aortic aneurysm) without rupture (HCC) 09/15/2021   History of bradycardia 10/21/2020   Spinal stenosis at L4-L5 level 08/09/2020   Insomnia 02/06/2018   Visit for monitoring Tikosyn therapy 01/10/2016   Atrial flutter (HCC) 08/07/2015   Bilateral carotid artery disease (HCC) 07/21/2013   CAD (coronary artery disease) 01/26/2013   Chronic obstructive pulmonary disease (HCC) 01/26/2013   Essential  hypertension, benign 01/26/2013   Hyperlipidemia 01/26/2013    PCP: Benita Stabile, MD  REFERRING PROVIDER: Donalee Citrin, MD  REFERRING DIAG: M48.07 (ICD-10-CM) - Spinal stenosis of lumbosacral region  Rationale for Evaluation and Treatment: Rehabilitation  THERAPY DIAG:  Weakness of right lower extremity  Difficulty in walking, not elsewhere classified  ONSET DATE: July 2024  SUBJECTIVE:                                                                                                                                                                                           SUBJECTIVE STATEMENT: No pain today. Pt giving brief history of purpose of PT POC.   EVAL: Arrives to the clinic with c/o L LE weakness. Denies any numbness on the legs. Patient reports that he doesn't have any strength on the legs. Due to the problem, patient states that it is difficult to squat. Denies worsening of leg weakness but the weakness can be more felt when he gets physically active. Fell January 31, 2023. Had L brain surgery on July 2024 due to bleeding in the brain. Weakness on the legs started since he had the brain surgery in July 2024. Patient denies any imaging on the legs or medications to address the issues. Patient was then referred to outpatient PT evaluation and management.   PERTINENT HISTORY:  Lumbar laminectomy 2021, L brain surgery  PAIN:  Are you having pain? No  PRECAUTIONS: Fall  RED FLAGS: None   WEIGHT BEARING RESTRICTIONS: No  FALLS:  Has patient fallen in last 6 months? No  LIVING ENVIRONMENT: Lives with: lives with their spouse Lives in: House/apartment Stairs: Yes: External: 3 steps; bilateral but cannot reach both Has following equipment at home: Dan Humphreys - 4 wheeled, shower chair, and Shower bench  OCCUPATION: retired  PLOF: Independent and Independent with basic ADLs  PATIENT GOALS: "to try to see if the left leg is going to get stronger"  NEXT MD VISIT: April  2025  OBJECTIVE:  Note: Objective measures were completed at Evaluation unless otherwise noted.  DIAGNOSTIC FINDINGS:  None performed relevant to the area/complaint recently   PATIENT SURVEYS:  ABC scale 1040/1600 = 65%  COGNITION: Overall cognitive status: Within functional limits for tasks assessed     SENSATION: Light touch: WFL on B LE  MUSCLE LENGTH: Hamstrings: moderate restriction on B Thomas test: mild restriction on B Piriformis test: mild restriction on B  POSTURE: rounded shoulders, forward head, decreased lumbar lordosis, increased thoracic kyphosis, posterior pelvic tilt, and knees slightly flexed  LUMBAR ROM:   AROM eval  Flexion 50%  Extension 5%  Right lateral flexion   Left lateral flexion   Right rotation 75%  Left rotation 50%   (Blank rows = not tested)  LOWER EXTREMITY ROM:     Active  Right eval Left eval  Hip flexion [facility]  Hip extension [facility]  Hip abduction [facility]  Hip adduction    Hip internal rotation    Hip external rotation    Knee flexion [facility] WFL  Knee extension [facility]  Ankle dorsiflexion [facility] WFL  Ankle plantarflexion [facility] WFL  Ankle inversion    Ankle eversion     (Blank rows = not tested)  LOWER EXTREMITY MMT:    MMT Right eval Left eval  Hip flexion 4 4  Hip extension 4- 4  Hip abduction 3+ 4  Hip adduction    Hip internal rotation    Hip external rotation    Knee flexion 3+ 4  Knee extension 4- 4  Ankle dorsiflexion 3+ 4  Ankle plantarflexion    Ankle inversion    Ankle eversion     (Blank rows = not tested)  LUMBAR SPECIAL TESTS:  Straight leg raise test: positive for hamstring tightness  FUNCTIONAL TESTS:  5 times sit to stand: 11.41 sec 2 minute walk test: 464 ft Dynamic Gait Index: 14  DGI 1. Gait level surface (2) Mild Impairment: Walks 20', uses assistive devices, slower speed, mild gait deviations. 2. Change in gait speed (2) Mild Impairment: Is able to change speed but demonstrates mild  gait deviations, or not gait deviations but unable to achieve a significant change in velocity, or uses an assistive device. 3. Gait with horizontal head turns (1) Moderate Impairment: Performs head turns with moderate change in gait velocity, slows down, staggers but recovers, can continue to walk. 4. Gait with vertical head turns (2) Mild Impairment: Performs head turns smoothly with slight change in gait velocity, i.e., minor disruption to smooth gait path or uses walking aid. 5. Gait and pivot turn (2) Mild Impairment: Pivot turns safely in > 3 seconds and stops with no loss of balance. 6. Step over obstacle (2) Mild Impairment: Is able to step over box, but must slow down and adjust steps to clear box safely. 7. Step around obstacles (1) Moderate Impairment: Is able to clear cones but must significantly slow, speed to accomplish task, or requires verbal cueing. 8. Stairs (2) Mild Impairment: Alternating feet, must use rail.  TOTAL SCORE: 14 / 24  GAIT: Distance walked: 464 ft Assistive device utilized: None Level of assistance: Complete Independence Comments: done during , slight foot slap on R  TREATMENT DATE:  11/06/2023  -Vitals in sitting: 137/80 HR 72 -Offsit sit/stands with 4in boxunder LLE with tidal tank sphere 2x5 with CGA for balance.  -Seated ankle DF with 3lb weight ove toes 2 x 12-15 -Standing hip abduction with GTB at knees with cues for proper trunk posture x 12-15 bilaterally -Seated knee flexion and ankle DF on treadmill on RLE x 8 (fatigued out) -Standing knee flexion w/ 3'' isometric  2 x 15  11/01/23: Reviewed goals Educated importance of HEP compliance for maximal benefits STS no HHA eccentric control 10x  Supine: Bridge 10x 5" 2 sets  Sidelying: Clam GTB 10x 5"  Standing: Heel raise 10x Toe raise 10x Squat front of chair 10x Tandem stance 2x 30"  10/30/23 Evaluation and patient education done                                                                                                                                PATIENT EDUCATION:  Education details: Educated on the goals and course of rehab. Educated on measures to reduce falls at home. Person educated: Patient Education method: Explanation Education comprehension: verbalized understanding  HOME EXERCISE PROGRAM: Access Code: [email or web address] URL: [email or web address] Date: 11/01/2023 Prepared by: Becky Sax  Exercises - Sit to Stand with Hands on Knees  - 2 x daily - 7 x weekly - 2 sets - 10 reps - Supine Bridge  - 1 x daily - 7 x weekly - 3 sets - 10 reps - 5" hold - Clam with Resistance  - 1 x daily - 7 x weekly - 3 sets - 10 reps - 5" hold - Standing Tandem Balance with Counter Support  - 1 x daily - 7 x weekly - 1 sets - 3 reps - 30" hold - Heel Toe Raises with Counter Support  - 1 x daily - 7 x weekly - 3 sets - 10 reps - 5" hold  Access Code: [email or web address] URL: [email or web address] Date: 11/06/2023 Prepared by: Starling Manns  Exercises - Standing Single Leg Stance with Counter Support  - 1 x daily - 7 x weekly - 3 sets - 10 reps - Standing Knee Flexion with Counter Support  - 1 x daily - 7 x weekly - 3 sets - 12-15 reps - 3-5 hold  ASSESSMENT:  CLINICAL IMPRESSION: Pt tolerating therapy session well. Vitals taken at beginning of session- 132/80. Pt showing easier fatigue with RLE during standing activities but tolerates the challenge well. Poor recruitment in R tibialis anterior musculature, started at lower level isolated strengthening and balance activities to improve RLE endurance and strength. Advanced R hamstring HEP. Pt will benefit from skilled Physical Therapy services to address deficits/limitations in order to improve functional and QOL.   EVAL: Patient is a 78 y.o. male who was seen today for physical therapy evaluation and treatment for lumbar stenosis. Patient was diagnosed with lumbar by referring provider further defined  by difficulty with squatting due to weakness, impaired balance, and decreased soft tissue extensibility. Patient reports that his L LE is weak but per objective findings, patient's R LE is weaker than the L which could be attributed from surgery on the L side of the brain. Skilled PT is required to address the impairments and functional limitations listed below.    OBJECTIVE IMPAIRMENTS: Abnormal gait, decreased activity tolerance, decreased balance, difficulty walking, decreased ROM, decreased strength, and impaired flexibility.  ACTIVITY LIMITATIONS: carrying, lifting, bending, standing, squatting, and stairs  PARTICIPATION LIMITATIONS: meal prep, cleaning, laundry, driving, shopping, community activity, and yard work  PERSONAL FACTORS: Age and Time since onset of injury/illness/exacerbation are also affecting patient's functional outcome.   REHAB POTENTIAL: Fair    CLINICAL DECISION MAKING: Stable/uncomplicated  EVALUATION COMPLEXITY: Low   GOALS: Goals reviewed with patient? Yes  SHORT TERM GOALS: Target date: 11/13/23  Pt will demonstrate indep in HEP to facilitate carry-over of skilled services and improve functional outcomes Goal status: INITIAL  LONG TERM GOALS: Target date: 11/27/23  Pt will improve ABC scale score by 10% in order to demonstrate improved confidence with balance and safety during ambulation and other ADLs  Baseline: 65% Goal status: INITIAL  2.  Pt will increase by at least 40 ft in order to demonstrate clinically significant improvement in community ambulation Baseline: 464 ft Goal status: INITIAL  3.  Pt will improve DGI by at least 3 points in order to demonstrate clinically significant improvement in balance and decreased risk for falls Baseline: 14 Goal status: INITIAL  4.  Pt will demonstrate increase in LE strength to 4-/5 to facilitate ease and safety in ambulation Baseline: 3+/5 Goal status: INITIAL  PLAN:  PT FREQUENCY: 2x/week  PT  DURATION: 4 weeks  PLANNED INTERVENTIONS: [id number]- PT Re-evaluation, 97110-Therapeutic exercises, 97530- Therapeutic activity, [id number]- Neuromuscular re-education, 97535- Self Care, [id number]- Manual therapy, [phone]- Gait training, Patient/Family education, and Stair training.  PLAN FOR NEXT SESSION: Begin LE strengthening, flexibility, balance, and gait activities.   Nelida Meuse PT, DPT [facility] [place] [phone] office  11/06/2023, 10:12 AM

## 2023-11-13 ENCOUNTER — Ambulatory Visit (HOSPITAL_COMMUNITY)

## 2023-11-13 ENCOUNTER — Encounter (HOSPITAL_COMMUNITY): Payer: Self-pay

## 2023-11-13 DIAGNOSIS — R262 Difficulty in walking, not elsewhere classified: Secondary | ICD-10-CM | POA: Diagnosis not present

## 2023-11-13 DIAGNOSIS — R29898 Other symptoms and signs involving the musculoskeletal system: Secondary | ICD-10-CM | POA: Diagnosis not present
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
## 2023-11-14 ENCOUNTER — Ambulatory Visit (HOSPITAL_COMMUNITY): Admitting: Physical Therapy

## 2023-11-14 DIAGNOSIS — R29898 Other symptoms and signs involving the musculoskeletal system: Secondary | ICD-10-CM | POA: Diagnosis not present

## 2023-11-14 DIAGNOSIS — R262 Difficulty in walking, not elsewhere classified: Secondary | ICD-10-CM | POA: Diagnosis not present

## 2023-11-14 NOTE — Therapy (Signed)
 OUTPATIENT PHYSICAL THERAPY THORACOLUMBAR TREATMENT   Patient Name: [name] MRN: [id number] DOB:1946/04/06, 78 y.o., male Today's Date: 11/14/2023  END OF SESSION:  PT End of Session - 11/14/23 0937     Visit Number 5    Number of Visits 8    Date for PT Re-Evaluation 11/27/23    Authorization Type Medicare Part A/B    PT Start Time 0935    PT Stop Time 1015    PT Time Calculation (min) 40 min    Equipment Utilized During Treatment Gait belt    Activity Tolerance Patient tolerated treatment well    Behavior During Therapy [facility] for tasks assessed/performed               Past Medical History:  Diagnosis Date   AAA (abdominal aortic aneurysm) (HCC)    06/27/20 MRI: 3.0 cm infrarenal AAA, recommend 3 year follow-up   Anxiety    Atrial fibrillation (HCC)    Bilateral carotid artery disease (HCC) 07/21/2013   CAD (coronary artery disease) 2008   CABG   COPD (chronic obstructive pulmonary disease) (HCC)    Dysrhythmia    A-fib   Gastritis    Headache(784.0)    History of hiatal hernia    Hypertension    SDH (subdural hematoma) (HCC) 01/31/2023   Visit for monitoring Tikosyn therapy 12/2015   Past Surgical History:  Procedure Laterality Date   BURR HOLE Left 03/27/2023   Procedure: BURR HOLE EVASCUATION OF SUBDURAL HEMATOMA;  Surgeon: Jadene Pierini, MD;  Location: [facility] OR;  Service: Neurosurgery;  Laterality: Left;   CARDIOVERSION N/A 09/08/2015   Procedure: CARDIOVERSION;  Surgeon: Vesta Mixer, MD;  Location: [facility] ENDOSCOPY;  Service: Cardiovascular;  Laterality: N/A;   CORONARY ARTERY BYPASS GRAFT     EXCISION OF KELOID Left 10/08/2018   Procedure: EXCISION OF CHRONIC ABDOMINAL WALL WOUND;  Surgeon: Abigail Miyamoto, MD;  Location: [facility];  Service: General;  Laterality: Left;   INCISION AND DRAINAGE DEEP NECK ABSCESS     IR ANGIO EXTERNAL CAROTID SEL EXT CAROTID UNI L MOD SED  04/17/2023   IR ANGIO INTRA EXTRACRAN SEL COM CAROTID  INNOMINATE UNI L MOD SED  04/17/2023   IR NEURO EACH ADD'L AFTER BASIC UNI LEFT (MS)  04/17/2023   LUMBAR LAMINECTOMY/DECOMPRESSION MICRODISCECTOMY Right 08/09/2020   Procedure: Laminectomy and Foraminotomy - Lumbar three-Lumbar four - Lumbar four-Lumbar five- right;  Surgeon: Donalee Citrin, MD;  Location: [facility] OR;  Service: Neurosurgery;  Laterality: Right;   RADIOLOGY WITH ANESTHESIA N/A 04/17/2023   Procedure: Left MMA;  Surgeon: Lisbeth Renshaw, MD;  Location: [facility] OR;  Service: Radiology;  Laterality: N/A;   ROTATOR CUFF REPAIR Bilateral    TEE WITHOUT CARDIOVERSION N/A 08/02/2015   Procedure: TRANSESOPHAGEAL ECHOCARDIOGRAM (TEE);  Surgeon: Quintella Reichert, MD;  Location: [facility] ENDOSCOPY;  Service: Cardiovascular;  Laterality: N/A;   TEE WITHOUT CARDIOVERSION N/A 09/08/2015   Procedure: TRANSESOPHAGEAL ECHOCARDIOGRAM (TEE);  Surgeon: Vesta Mixer, MD;  Location: [facility] ENDOSCOPY;  Service: Cardiovascular;  Laterality: N/A;   Patient Active Problem List   Diagnosis Date Noted   Fluid overload 05/12/2023   CHF (congestive heart failure) (HCC) 05/12/2023   Heart failure with improved ejection fraction (HFimpEF) (HCC) 04/24/2023   Lower extremity edema 04/24/2023   Cerebral aneurysm 04/17/2023   COVID-19 04/03/2023   Right sided weakness 04/03/2023   Abnormal gait 03/27/2023   Subdural hematoma (HCC) 03/26/2023   Head injury 02/13/2023   Fall 02/13/2023  Subdural hematoma due to concussion (HCC) 02/13/2023   Paroxysmal atrial fibrillation (HCC) 09/15/2021   Hypercoagulable state due to paroxysmal atrial fibrillation (HCC) 09/15/2021   AAA (abdominal aortic aneurysm) without rupture (HCC) 09/15/2021   History of bradycardia 10/21/2020   Spinal stenosis at L4-L5 level 08/09/2020   Insomnia 02/06/2018   Visit for monitoring Tikosyn therapy 01/10/2016   Atrial flutter (HCC) 08/07/2015   Bilateral carotid artery disease (HCC) 07/21/2013   CAD (coronary artery disease) 01/26/2013   Chronic  obstructive pulmonary disease (HCC) 01/26/2013   Essential hypertension, benign 01/26/2013   Hyperlipidemia 01/26/2013    PCP: Benita Stabile, MD  REFERRING PROVIDER: Donalee Citrin, MD  REFERRING DIAG: M48.07 (ICD-10-CM) - Spinal stenosis of lumbosacral region  Rationale for Evaluation and Treatment: Rehabilitation  THERAPY DIAG:  Weakness of right lower extremity  Difficulty in walking, not elsewhere classified  ONSET DATE: July 2024  SUBJECTIVE:                                                                                                                                                                                           SUBJECTIVE STATEMENT: Pt states he's going to work after therapy today.  Currently without any pain or issues.  Been keeping up with his HR at home.  EVAL: Arrives to the clinic with c/o L LE weakness. Denies any numbness on the legs. Patient reports that he doesn't have any strength on the legs. Due to the problem, patient states that it is difficult to squat. Denies worsening of leg weakness but the weakness can be more felt when he gets physically active. Fell January 31, 2023. Had L brain surgery on July 2024 due to bleeding in the brain. Weakness on the legs started since he had the brain surgery in July 2024. Patient denies any imaging on the legs or medications to address the issues. Patient was then referred to outpatient PT evaluation and management.   PERTINENT HISTORY:  Lumbar laminectomy 2021, L brain surgery  PAIN:  Are you having pain? No  PRECAUTIONS: Fall  RED FLAGS: None   WEIGHT BEARING RESTRICTIONS: No  FALLS:  Has patient fallen in last 6 months? No  LIVING ENVIRONMENT: Lives with: lives with their spouse Lives in: House/apartment Stairs: Yes: External: 3 steps; bilateral but cannot reach both Has following equipment at home: Dan Humphreys - 4 wheeled, shower chair, and Shower bench  OCCUPATION: retired  PLOF: Independent and  Independent with basic ADLs  PATIENT GOALS: "to try to see if the left leg is going to get stronger"  NEXT MD VISIT: April 2025  OBJECTIVE:  Note: Objective measures were  completed at Evaluation unless otherwise noted.  DIAGNOSTIC FINDINGS:  None performed relevant to the area/complaint recently   PATIENT SURVEYS:  ABC scale 1040/1600 = 65%  COGNITION: Overall cognitive status: Within functional limits for tasks assessed     SENSATION: Light touch: WFL on B LE  MUSCLE LENGTH: Hamstrings: moderate restriction on B Thomas test: mild restriction on B Piriformis test: mild restriction on B  POSTURE: rounded shoulders, forward head, decreased lumbar lordosis, increased thoracic kyphosis, posterior pelvic tilt, and knees slightly flexed  LUMBAR ROM:   AROM eval  Flexion 50%  Extension 5%  Right lateral flexion   Left lateral flexion   Right rotation 75%  Left rotation 50%   (Blank rows = not tested)  LOWER EXTREMITY ROM:     Active  Right eval Left eval  Hip flexion [facility]  Hip extension [facility]  Hip abduction [facility]  Hip adduction    Hip internal rotation    Hip external rotation    Knee flexion [facility] WFL  Knee extension [facility]  Ankle dorsiflexion [facility] WFL  Ankle plantarflexion [facility] WFL  Ankle inversion    Ankle eversion     (Blank rows = not tested)  LOWER EXTREMITY MMT:    MMT Right eval Left eval  Hip flexion 4 4  Hip extension 4- 4  Hip abduction 3+ 4  Hip adduction    Hip internal rotation    Hip external rotation    Knee flexion 3+ 4  Knee extension 4- 4  Ankle dorsiflexion 3+ 4  Ankle plantarflexion    Ankle inversion    Ankle eversion     (Blank rows = not tested)  LUMBAR SPECIAL TESTS:  Straight leg raise test: positive for hamstring tightness  FUNCTIONAL TESTS:  5 times sit to stand: 11.41 sec 2 minute walk test: 464 ft Dynamic Gait Index: 14  DGI 1. Gait level surface (2) Mild Impairment: Walks 20', uses assistive devices,  slower speed, mild gait deviations. 2. Change in gait speed (2) Mild Impairment: Is able to change speed but demonstrates mild gait deviations, or not gait deviations but unable to achieve a significant change in velocity, or uses an assistive device. 3. Gait with horizontal head turns (1) Moderate Impairment: Performs head turns with moderate change in gait velocity, slows down, staggers but recovers, can continue to walk. 4. Gait with vertical head turns (2) Mild Impairment: Performs head turns smoothly with slight change in gait velocity, i.e., minor disruption to smooth gait path or uses walking aid. 5. Gait and pivot turn (2) Mild Impairment: Pivot turns safely in > 3 seconds and stops with no loss of balance. 6. Step over obstacle (2) Mild Impairment: Is able to step over box, but must slow down and adjust steps to clear box safely. 7. Step around obstacles (1) Moderate Impairment: Is able to clear cones but must significantly slow, speed to accomplish task, or requires verbal cueing. 8. Stairs (2) Mild Impairment: Alternating feet, must use rail.  TOTAL SCORE: 14 / 24  GAIT: Distance walked: 464 ft Assistive device utilized: None Level of assistance: Complete Independence Comments: done during , slight foot slap on R  TREATMENT DATE:  11/14/23 Vitals seated 116/73, HR 81 Standing:  heelraises 20X   Knee flexion 20X each 3" holds  Hip abduction 2X10  Hip extension 2X10  Lunges onto 4" step no UE 2X10 STS with 4" box under Lt LE, tidal tank sphere 10X, CGA   11/13/23:  Vitals in sitting 173/80 HR 83 3 minutes deep breathing Vitals: 168/94 HR 77 -Heel raises 20 each -Seated DF with 3# over toes 2x 12 reps -Offsit sit/stands with 4in boxunder LLE with tidal tank sphere 10x with CGA for balance. -Partial tandem stance with Lt on 6in step height holding tidal tank sphere x 2 min (palloff short distance) -Standing knee flexion w/ 3'' isometric 2 x 15 -Squat front of chair  with cueing for mechanics 8 reps 2 sets- limited by fatigue with exercise -sidestep with RTB around thigh 2RT with min guard  11/06/2023  -Vitals in sitting: 137/80 HR 72 -Offsit sit/stands with 4in boxunder LLE with tidal tank sphere 2x5 with CGA for balance.  -Seated ankle DF with 3lb weight ove toes 2 x 12-15 -Standing hip abduction with GTB at knees with cues for proper trunk posture x 12-15 bilaterally -Seated knee flexion and ankle DF on treadmill on RLE x 8 (fatigued out) -Standing knee flexion w/ 3'' isometric 2 x 15  11/01/23: Reviewed goals Educated importance of HEP compliance for maximal benefits STS no HHA eccentric control 10x  Supine: Bridge 10x 5" 2 sets  Sidelying: Clam GTB 10x 5"  Standing: Heel raise 10x Toe raise 10x Squat front of chair 10x Tandem stance 2x 30"  10/30/23 Evaluation and patient education done                                                                                                                               PATIENT EDUCATION:  Education details: Educated on the goals and course of rehab. Educated on measures to reduce falls at home. Person educated: Patient Education method: Explanation Education comprehension: verbalized understanding  HOME EXERCISE PROGRAM: Access Code: [email or web address] URL: [email or web address] Date: 11/01/2023 Prepared by: Becky Sax  Exercises - Sit to Stand with Hands on Knees  - 2 x daily - 7 x weekly - 2 sets - 10 reps - Supine Bridge  - 1 x daily - 7 x weekly - 3 sets - 10 reps - 5" hold - Clam with Resistance  - 1 x daily - 7 x weekly - 3 sets - 10 reps - 5" hold - Standing Tandem Balance with Counter Support  - 1 x daily - 7 x weekly - 1 sets - 3 reps - 30" hold - Heel Toe Raises with Counter Support  - 1 x daily - 7 x weekly - 3 sets - 10 reps - 5" hold  Access Code: [email or web address] URL: [email or web address] Date: 11/06/2023 Prepared by: Starling Manns  Exercises -  Standing Single Leg Stance with Counter Support  - 1 x daily - 7 x weekly - 3 sets - 10 reps - Standing Knee Flexion with Counter Support  - 1 x daily - 7 x weekly - 3 sets - 12-15 reps - 3-5 hold  ASSESSMENT:  CLINICAL IMPRESSION: BP/HR taken at beginning of session with good reading.  Pt with minimal form needed  as mostly maintained upright posturing with all activities.  Pt required several seated rest breaks due to fatigue, but otherwise good activity tolerance demonstrated.  Continued  focus with bilateral LE strengthening and balance training.  Good strength observed with sit to stands while holding the weighted ball. Pt will continued to benefit from skilled therapy to progress towards goals.     EVAL: Patient is a 78 y.o. male who was seen today for physical therapy evaluation and treatment for lumbar stenosis. Patient was diagnosed with lumbar by referring provider further defined by difficulty with squatting due to weakness, impaired balance, and decreased soft tissue extensibility. Patient reports that his L LE is weak but per objective findings, patient's R LE is weaker than the L which could be attributed from surgery on the L side of the brain. Skilled PT is required to address the impairments and functional limitations listed below.    OBJECTIVE IMPAIRMENTS: Abnormal gait, decreased activity tolerance, decreased balance, difficulty walking, decreased ROM, decreased strength, and impaired flexibility.   ACTIVITY LIMITATIONS: carrying, lifting, bending, standing, squatting, and stairs  PARTICIPATION LIMITATIONS: meal prep, cleaning, laundry, driving, shopping, community activity, and yard work  PERSONAL FACTORS: Age and Time since onset of injury/illness/exacerbation are also affecting patient's functional outcome.   REHAB POTENTIAL: Fair    CLINICAL DECISION MAKING: Stable/uncomplicated  EVALUATION COMPLEXITY: Low   GOALS: Goals reviewed with patient? Yes  SHORT TERM GOALS:  Target date: 11/13/23  Pt will demonstrate indep in HEP to facilitate carry-over of skilled services and improve functional outcomes Goal status: INITIAL  LONG TERM GOALS: Target date: 11/27/23  Pt will improve ABC scale score by 10% in order to demonstrate improved confidence with balance and safety during ambulation and other ADLs  Baseline: 65% Goal status: INITIAL  2.  Pt will increase by at least 40 ft in order to demonstrate clinically significant improvement in community ambulation Baseline: 464 ft Goal status: INITIAL  3.  Pt will improve DGI by at least 3 points in order to demonstrate clinically significant improvement in balance and decreased risk for falls Baseline: 14 Goal status: INITIAL  4.  Pt will demonstrate increase in LE strength to 4-/5 to facilitate ease and safety in ambulation Baseline: 3+/5 Goal status: INITIAL  PLAN:  PT FREQUENCY: 2x/week  PT DURATION: 4 weeks  PLANNED INTERVENTIONS: 97164- PT Re-evaluation, 97110-Therapeutic exercises, 97530- Therapeutic activity, [id number]- Neuromuscular re-education, 97535- Self Care, [id number]- Manual therapy, [id number]- Gait training, Patient/Family education, and Stair training.  PLAN FOR NEXT SESSION: contiinue LE strengthening, flexibility, balance, and gait activities.  Lurena Nida, PTA/CLT [facility] Ph: [phone]  11/14/2023, 9:38 AM

## 2023-11-19 ENCOUNTER — Telehealth: Payer: Self-pay | Admitting: Physician Assistant

## 2023-11-19 ENCOUNTER — Encounter (HOSPITAL_COMMUNITY): Payer: Self-pay

## 2023-11-19 ENCOUNTER — Other Ambulatory Visit: Payer: Self-pay

## 2023-11-19 ENCOUNTER — Ambulatory Visit (HOSPITAL_COMMUNITY)

## 2023-11-19 DIAGNOSIS — R262 Difficulty in walking, not elsewhere classified: Secondary | ICD-10-CM

## 2023-11-19 DIAGNOSIS — I480 Paroxysmal atrial fibrillation: Secondary | ICD-10-CM

## 2023-11-19 DIAGNOSIS — I10 Essential (primary) hypertension: Secondary | ICD-10-CM

## 2023-11-19 DIAGNOSIS — R29898 Other symptoms and signs involving the musculoskeletal system: Secondary | ICD-10-CM | POA: Diagnosis not present

## 2023-11-19 MED ORDER — LOSARTAN POTASSIUM 25 MG PO TABS: 25.0000 mg | ORAL_TABLET | Freq: Every day | ORAL | 3 refills | Status: DC

## 2023-11-19 NOTE — Telephone Encounter (Signed)
 Restart Losartan 25 mg once daily BMET 2 weeks Send BP readings in 2 weeks. Tereso Newcomer, PA-C    11/19/2023 4:33 PM

## 2023-11-19 NOTE — Therapy (Addendum)
 OUTPATIENT PHYSICAL THERAPY THORACOLUMBAR TREATMENT   Patient Name: [name] MRN: [id number] DOB:09/30/1945, 78 y.o., male Today's Date: 11/19/2023  END OF SESSION:  PT End of Session - 11/19/23 0843     Visit Number 6    Number of Visits 8    Date for PT Re-Evaluation 11/27/23    Authorization Type Medicare Part A/B    Progress Note Due on Visit 8    PT Start Time 0804    PT Stop Time 0843    PT Time Calculation (min) 39 min    Equipment Utilized During Treatment Gait belt    Activity Tolerance Patient tolerated treatment well    Behavior During Therapy [facility] for tasks assessed/performed                Past Medical History:  Diagnosis Date   AAA (abdominal aortic aneurysm) (HCC)    06/27/20 MRI: 3.0 cm infrarenal AAA, recommend 3 year follow-up   Anxiety    Atrial fibrillation (HCC)    Bilateral carotid artery disease (HCC) 07/21/2013   CAD (coronary artery disease) 2008   CABG   COPD (chronic obstructive pulmonary disease) (HCC)    Dysrhythmia    A-fib   Gastritis    Headache(784.0)    History of hiatal hernia    Hypertension    SDH (subdural hematoma) (HCC) 01/31/2023   Visit for monitoring Tikosyn therapy 12/2015   Past Surgical History:  Procedure Laterality Date   BURR HOLE Left 03/27/2023   Procedure: BURR HOLE EVASCUATION OF SUBDURAL HEMATOMA;  Surgeon: Jadene Pierini, MD;  Location: [facility] OR;  Service: Neurosurgery;  Laterality: Left;   CARDIOVERSION N/A 09/08/2015   Procedure: CARDIOVERSION;  Surgeon: Vesta Mixer, MD;  Location: [facility] ENDOSCOPY;  Service: Cardiovascular;  Laterality: N/A;   CORONARY ARTERY BYPASS GRAFT     EXCISION OF KELOID Left 10/08/2018   Procedure: EXCISION OF CHRONIC ABDOMINAL WALL WOUND;  Surgeon: Abigail Miyamoto, MD;  Location: [facility];  Service: General;  Laterality: Left;   INCISION AND DRAINAGE DEEP NECK ABSCESS     IR ANGIO EXTERNAL CAROTID SEL EXT CAROTID UNI L MOD SED  04/17/2023   IR ANGIO  INTRA EXTRACRAN SEL COM CAROTID INNOMINATE UNI L MOD SED  04/17/2023   IR NEURO EACH ADD'L AFTER BASIC UNI LEFT (MS)  04/17/2023   LUMBAR LAMINECTOMY/DECOMPRESSION MICRODISCECTOMY Right 08/09/2020   Procedure: Laminectomy and Foraminotomy - Lumbar three-Lumbar four - Lumbar four-Lumbar five- right;  Surgeon: Donalee Citrin, MD;  Location: [facility] OR;  Service: Neurosurgery;  Laterality: Right;   RADIOLOGY WITH ANESTHESIA N/A 04/17/2023   Procedure: Left MMA;  Surgeon: Lisbeth Renshaw, MD;  Location: [facility] OR;  Service: Radiology;  Laterality: N/A;   ROTATOR CUFF REPAIR Bilateral    TEE WITHOUT CARDIOVERSION N/A 08/02/2015   Procedure: TRANSESOPHAGEAL ECHOCARDIOGRAM (TEE);  Surgeon: Quintella Reichert, MD;  Location: [facility] ENDOSCOPY;  Service: Cardiovascular;  Laterality: N/A;   TEE WITHOUT CARDIOVERSION N/A 09/08/2015   Procedure: TRANSESOPHAGEAL ECHOCARDIOGRAM (TEE);  Surgeon: Vesta Mixer, MD;  Location: [facility] ENDOSCOPY;  Service: Cardiovascular;  Laterality: N/A;   Patient Active Problem List   Diagnosis Date Noted   Fluid overload 05/12/2023   CHF (congestive heart failure) (HCC) 05/12/2023   Heart failure with improved ejection fraction (HFimpEF) (HCC) 04/24/2023   Lower extremity edema 04/24/2023   Cerebral aneurysm 04/17/2023   COVID-19 04/03/2023   Right sided weakness 04/03/2023   Abnormal gait 03/27/2023   Subdural hematoma (HCC) 03/26/2023  Head injury 02/13/2023   Fall 02/13/2023   Subdural hematoma due to concussion (HCC) 02/13/2023   Paroxysmal atrial fibrillation (HCC) 09/15/2021   Hypercoagulable state due to paroxysmal atrial fibrillation (HCC) 09/15/2021   AAA (abdominal aortic aneurysm) without rupture (HCC) 09/15/2021   History of bradycardia 10/21/2020   Spinal stenosis at L4-L5 level 08/09/2020   Insomnia 02/06/2018   Visit for monitoring Tikosyn therapy 01/10/2016   Atrial flutter (HCC) 08/07/2015   Bilateral carotid artery disease (HCC) 07/21/2013   CAD (coronary artery  disease) 01/26/2013   Chronic obstructive pulmonary disease (HCC) 01/26/2013   Essential hypertension, benign 01/26/2013   Hyperlipidemia 01/26/2013    PCP: Benita Stabile, MD  REFERRING PROVIDER: Donalee Citrin, MD  REFERRING DIAG: M48.07 (ICD-10-CM) - Spinal stenosis of lumbosacral region  Rationale for Evaluation and Treatment: Rehabilitation  THERAPY DIAG:  Weakness of right lower extremity  Difficulty in walking, not elsewhere classified  ONSET DATE: July 2024  SUBJECTIVE:                                                                                                                                                                                           SUBJECTIVE STATEMENT:  Pt reporting no issues over the weekend. Reports that he feels his balance is getting a little bit better.   EVAL: Arrives to the clinic with c/o L LE weakness. Denies any numbness on the legs. Patient reports that he doesn't have any strength on the legs. Due to the problem, patient states that it is difficult to squat. Denies worsening of leg weakness but the weakness can be more felt when he gets physically active. Fell January 31, 2023. Had L brain surgery on July 2024 due to bleeding in the brain. Weakness on the legs started since he had the brain surgery in July 2024. Patient denies any imaging on the legs or medications to address the issues. Patient was then referred to outpatient PT evaluation and management.   PERTINENT HISTORY:  Lumbar laminectomy 2021, L brain surgery  PAIN:  Are you having pain? No  PRECAUTIONS: Fall  RED FLAGS: None   WEIGHT BEARING RESTRICTIONS: No  FALLS:  Has patient fallen in last 6 months? No  LIVING ENVIRONMENT: Lives with: lives with their spouse Lives in: House/apartment Stairs: Yes: External: 3 steps; bilateral but cannot reach both Has following equipment at home: Dan Humphreys - 4 wheeled, shower chair, and Shower bench  OCCUPATION: retired  PLOF: Independent  and Independent with basic ADLs  PATIENT GOALS: "to try to see if the left leg is going to get stronger"  NEXT MD VISIT: April 2025  OBJECTIVE:  Note: Objective measures were completed at Evaluation unless otherwise noted.  DIAGNOSTIC FINDINGS:  None performed relevant to the area/complaint recently   PATIENT SURVEYS:  ABC scale 1040/1600 = 65%  COGNITION: Overall cognitive status: Within functional limits for tasks assessed     SENSATION: Light touch: WFL on B LE  MUSCLE LENGTH: Hamstrings: moderate restriction on B Thomas test: mild restriction on B Piriformis test: mild restriction on B  POSTURE: rounded shoulders, forward head, decreased lumbar lordosis, increased thoracic kyphosis, posterior pelvic tilt, and knees slightly flexed  LUMBAR ROM:   AROM eval  Flexion 50%  Extension 5%  Right lateral flexion   Left lateral flexion   Right rotation 75%  Left rotation 50%   (Blank rows = not tested)  LOWER EXTREMITY ROM:     Active  Right eval Left eval  Hip flexion [facility]  Hip extension [facility]  Hip abduction [facility]  Hip adduction    Hip internal rotation    Hip external rotation    Knee flexion [facility] WFL  Knee extension [facility]  Ankle dorsiflexion [facility] WFL  Ankle plantarflexion [facility] WFL  Ankle inversion    Ankle eversion     (Blank rows = not tested)  LOWER EXTREMITY MMT:    MMT Right eval Left eval  Hip flexion 4 4  Hip extension 4- 4  Hip abduction 3+ 4  Hip adduction    Hip internal rotation    Hip external rotation    Knee flexion 3+ 4  Knee extension 4- 4  Ankle dorsiflexion 3+ 4  Ankle plantarflexion    Ankle inversion    Ankle eversion     (Blank rows = not tested)  LUMBAR SPECIAL TESTS:  Straight leg raise test: positive for hamstring tightness  FUNCTIONAL TESTS:  5 times sit to stand: 11.41 sec 2 minute walk test: 464 ft Dynamic Gait Index: 14  DGI 1. Gait level surface (2) Mild Impairment: Walks 20', uses assistive  devices, slower speed, mild gait deviations. 2. Change in gait speed (2) Mild Impairment: Is able to change speed but demonstrates mild gait deviations, or not gait deviations but unable to achieve a significant change in velocity, or uses an assistive device. 3. Gait with horizontal head turns (1) Moderate Impairment: Performs head turns with moderate change in gait velocity, slows down, staggers but recovers, can continue to walk. 4. Gait with vertical head turns (2) Mild Impairment: Performs head turns smoothly with slight change in gait velocity, i.e., minor disruption to smooth gait path or uses walking aid. 5. Gait and pivot turn (2) Mild Impairment: Pivot turns safely in > 3 seconds and stops with no loss of balance. 6. Step over obstacle (2) Mild Impairment: Is able to step over box, but must slow down and adjust steps to clear box safely. 7. Step around obstacles (1) Moderate Impairment: Is able to clear cones but must significantly slow, speed to accomplish task, or requires verbal cueing. 8. Stairs (2) Mild Impairment: Alternating feet, must use rail.  TOTAL SCORE: 14 / 24  GAIT: Distance walked: 464 ft Assistive device utilized: None Level of assistance: Complete Independence Comments: done during , slight foot slap on R  TREATMENT DATE:  11/19/2023  -Standing hip vectors 10x 3'' bilaterally with finger UE assist -Tandem stance on aeromat 2 x 1' -LLE step ups on 4in box and 2in blue foam mat with unilateral KB with 5 and 10lb carry 2 x 10 with CGA -85ft x  2 ambulation with 10lb tidal tank carry performing lateral head turns @ CGA  -5ft x 2 ambulation with 10lb tidal tank carry performing vertical head thrusts @ CGA  -5 lateral walk outs at bodycraft #2 plate-CGA -5 backwards walk outs at bodycraft #2 palte- CGA -SL balance 1 x 1' bilaterally without UE support intermittent touch down.   11/14/23 Vitals seated 116/73, HR 81 Standing:  heelraises 20X   Knee flexion  20X each 3" holds  Hip abduction 2X10  Hip extension 2X10  Lunges onto 4" step no UE 2X10 STS with 4" box under Lt LE, tidal tank sphere 10X, CGA   11/13/23: Vitals in sitting 173/80 HR 83 3 minutes deep breathing Vitals: 168/94 HR 77 -Heel raises 20 each -Seated DF with 3# over toes 2x 12 reps -Offsit sit/stands with 4in boxunder LLE with tidal tank sphere 10x with CGA for balance. -Partial tandem stance with Lt on 6in step height holding tidal tank sphere x 2 min (palloff short distance) -Standing knee flexion w/ 3'' isometric 2 x 15 -Squat front of chair with cueing for mechanics 8 reps 2 sets- limited by fatigue with exercise -sidestep with RTB around thigh 2RT with min guard   PATIENT EDUCATION:  Education details: Educated on the goals and course of rehab. Educated on measures to reduce falls at home. Person educated: Patient Education method: Explanation Education comprehension: verbalized understanding  HOME EXERCISE PROGRAM: Access Code: [email or web address] URL: [email or web address] Date: 11/01/2023 Prepared by: Becky Sax  Exercises - Sit to Stand with Hands on Knees  - 2 x daily - 7 x weekly - 2 sets - 10 reps - Supine Bridge  - 1 x daily - 7 x weekly - 3 sets - 10 reps - 5" hold - Clam with Resistance  - 1 x daily - 7 x weekly - 3 sets - 10 reps - 5" hold - Standing Tandem Balance with Counter Support  - 1 x daily - 7 x weekly - 1 sets - 3 reps - 30" hold - Heel Toe Raises with Counter Support  - 1 x daily - 7 x weekly - 3 sets - 10 reps - 5" hold  Access Code: [email or web address] URL: [email or web address] Date: 11/06/2023 Prepared by: Starling Manns  Exercises - Standing Single Leg Stance with Counter Support  - 1 x daily - 7 x weekly - 3 sets - 10 reps - Standing Knee Flexion with Counter Support  - 1 x daily - 7 x weekly - 3 sets - 12-15 reps - 3-5 hold  ASSESSMENT:  CLINICAL IMPRESSION: Pt tolerating therapy treatment session well.  Focusing on continual balance and LE strengthening. Pt showing improvements in single leg tolerance. Geared interventions to address vestibular input with walking to address deficits noticed in DGI. Noticed R trendelenberg throughout weighted walking, continuing to promote weakness in BLE as factor of balance deficits. Pt will continued to benefit from skilled therapy to progress towards goals.     EVAL: Patient is a 78 y.o. male who was seen today for physical therapy evaluation and treatment for lumbar stenosis. Patient was diagnosed with lumbar by referring provider further defined by difficulty with squatting due to weakness, impaired balance, and decreased soft tissue extensibility. Patient reports that his L LE is weak but per objective findings, patient's R LE is weaker than the L which could be attributed from surgery on the L side of the brain. Skilled PT is required to address the impairments and functional limitations listed below.  OBJECTIVE IMPAIRMENTS: Abnormal gait, decreased activity tolerance, decreased balance, difficulty walking, decreased ROM, decreased strength, and impaired flexibility.   ACTIVITY LIMITATIONS: carrying, lifting, bending, standing, squatting, and stairs  PARTICIPATION LIMITATIONS: meal prep, cleaning, laundry, driving, shopping, community activity, and yard work  PERSONAL FACTORS: Age and Time since onset of injury/illness/exacerbation are also affecting patient's functional outcome.   REHAB POTENTIAL: Fair    CLINICAL DECISION MAKING: Stable/uncomplicated  EVALUATION COMPLEXITY: Low   GOALS: Goals reviewed with patient? Yes  SHORT TERM GOALS: Target date: 11/13/23  Pt will demonstrate indep in HEP to facilitate carry-over of skilled services and improve functional outcomes Goal status: INITIAL  LONG TERM GOALS: Target date: 11/27/23  Pt will improve ABC scale score by 10% in order to demonstrate improved confidence with balance and safety during  ambulation and other ADLs  Baseline: 65% Goal status: INITIAL  2.  Pt will increase by at least 40 ft in order to demonstrate clinically significant improvement in community ambulation Baseline: 464 ft Goal status: INITIAL  3.  Pt will improve DGI by at least 3 points in order to demonstrate clinically significant improvement in balance and decreased risk for falls Baseline: 14 Goal status: INITIAL  4.  Pt will demonstrate increase in LE strength to 4-/5 to facilitate ease and safety in ambulation Baseline: 3+/5 Goal status: INITIAL  PLAN:  PT FREQUENCY: 2x/week  PT DURATION: 4 weeks  PLANNED INTERVENTIONS: 97164- PT Re-evaluation, 97110-Therapeutic exercises, 97530- Therapeutic activity, [id number]- Neuromuscular re-education, 97535- Self Care, 16109- Manual therapy, [phone]- Gait training, Patient/Family education, and Stair training.  PLAN FOR NEXT SESSION: contiinue LE strengthening, flexibility, balance, and gait activities.  Nelida Meuse PT, DPT [facility] [place] [phone] office  11/19/2023, 8:44 AM

## 2023-11-19 NOTE — Telephone Encounter (Signed)
 11/14/23   BP 155/98  HR 79 11/12/23   BP 161/89  HR 91 11/18/23   BP 152/90  HR 73                Lowest    BP  108/73  HR 96            Highest   BP  161/89   HR 91       Wife was calling due to patient having high BP the last week and a half. She stated his BP is usually fine over 50% of the time, in the last month. She gave [place] the lowest and the highest his BP has been. She wanted Tereso Newcomer PA to know. Patient is on metoprolol succinate 50 mg twice a day. Losartan was d/c'd, put has on hand if patient needs to start again. Will forward to [facility] PA for advisement.

## 2023-11-19 NOTE — Telephone Encounter (Signed)
 New Message:      Wife said patient was told if his top number of his blood pressure got up to 140 or over to notify the doctor.     .Pt c/o BP issue:  1. What are your last 5 BP readings? 121/70,  last night it 152/90 pulse 73   2. Are you having any other symptoms (ex. Dizziness, headache, blurred vision, passed out)?   No 3. What is your medication issue? High blood pressure

## 2023-11-19 NOTE — Telephone Encounter (Signed)
 Let's have him see his PCP first for fatigue. If his PCP thinks he is having cardiac symptoms, we can get him earlier follow up here. Tereso Newcomer, PA-C    11/19/2023 5:01 PM

## 2023-11-19 NOTE — Telephone Encounter (Signed)
 Called patient's wife (DPR) about [name], [place] advisement. She verbalized understanding and repeated directions back. She also wanted the provider to know patient is very tired during the day and takes long naps. Will let Ian Newcomer, PA know.

## 2023-11-20 NOTE — Telephone Encounter (Signed)
 Returned a call back to the pts wife Joyce Gross (on [place]) and informed her that per Tereso Newcomer PA-C, he wants the pt to follow up with PCP first for fatigue, and if PCP thinks he is having cardiac symptoms, we can get the pt in sooner for follow-up at that time.   Wife verbalized understanding and agrees with this plan.

## 2023-11-22 ENCOUNTER — Encounter (HOSPITAL_COMMUNITY): Payer: Self-pay

## 2023-11-22 ENCOUNTER — Ambulatory Visit (HOSPITAL_COMMUNITY)

## 2023-11-22 DIAGNOSIS — R262 Difficulty in walking, not elsewhere classified: Secondary | ICD-10-CM

## 2023-11-22 DIAGNOSIS — R29898 Other symptoms and signs involving the musculoskeletal system: Secondary | ICD-10-CM | POA: Diagnosis not present

## 2023-11-22 NOTE — Therapy (Signed)
 OUTPATIENT PHYSICAL THERAPY THORACOLUMBAR TREATMENT   Patient Name: [name] MRN: [id number] DOB:04/07/1946, 78 y.o., male Today's Date: 11/22/2023  END OF SESSION:  PT End of Session - 11/22/23 0924     Visit Number 7    Number of Visits 8    Date for PT Re-Evaluation 11/27/23    Authorization Type Medicare Part A/B    Progress Note Due on Visit 8    PT Start Time 0845    PT Stop Time 0924    PT Time Calculation (min) 39 min    Equipment Utilized During Treatment Gait belt    Activity Tolerance Patient tolerated treatment well    Behavior During Therapy [facility] for tasks assessed/performed                 Past Medical History:  Diagnosis Date   AAA (abdominal aortic aneurysm) (HCC)    06/27/20 MRI: 3.0 cm infrarenal AAA, recommend 3 year follow-up   Anxiety    Atrial fibrillation (HCC)    Bilateral carotid artery disease (HCC) 07/21/2013   CAD (coronary artery disease) 2008   CABG   COPD (chronic obstructive pulmonary disease) (HCC)    Dysrhythmia    A-fib   Gastritis    Headache(784.0)    History of hiatal hernia    Hypertension    SDH (subdural hematoma) (HCC) 01/31/2023   Visit for monitoring Tikosyn therapy 12/2015   Past Surgical History:  Procedure Laterality Date   BURR HOLE Left 03/27/2023   Procedure: BURR HOLE EVASCUATION OF SUBDURAL HEMATOMA;  Surgeon: Jadene Pierini, MD;  Location: [facility] OR;  Service: Neurosurgery;  Laterality: Left;   CARDIOVERSION N/A 09/08/2015   Procedure: CARDIOVERSION;  Surgeon: Vesta Mixer, MD;  Location: [facility] ENDOSCOPY;  Service: Cardiovascular;  Laterality: N/A;   CORONARY ARTERY BYPASS GRAFT     EXCISION OF KELOID Left 10/08/2018   Procedure: EXCISION OF CHRONIC ABDOMINAL WALL WOUND;  Surgeon: Abigail Miyamoto, MD;  Location: [facility];  Service: General;  Laterality: Left;   INCISION AND DRAINAGE DEEP NECK ABSCESS     IR ANGIO EXTERNAL CAROTID SEL EXT CAROTID UNI L MOD SED  04/17/2023   IR ANGIO  INTRA EXTRACRAN SEL COM CAROTID INNOMINATE UNI L MOD SED  04/17/2023   IR NEURO EACH ADD'L AFTER BASIC UNI LEFT (MS)  04/17/2023   LUMBAR LAMINECTOMY/DECOMPRESSION MICRODISCECTOMY Right 08/09/2020   Procedure: Laminectomy and Foraminotomy - Lumbar three-Lumbar four - Lumbar four-Lumbar five- right;  Surgeon: Donalee Citrin, MD;  Location: [facility] OR;  Service: Neurosurgery;  Laterality: Right;   RADIOLOGY WITH ANESTHESIA N/A 04/17/2023   Procedure: Left MMA;  Surgeon: Lisbeth Renshaw, MD;  [facility] OR;  Service: Radiology;  Laterality: N/A;   ROTATOR CUFF REPAIR Bilateral    TEE WITHOUT CARDIOVERSION N/A 08/02/2015   Procedure: TRANSESOPHAGEAL ECHOCARDIOGRAM (TEE);  Surgeon: Quintella Reichert, MD;  Location: [facility] ENDOSCOPY;  Service: Cardiovascular;  Laterality: N/A;   TEE WITHOUT CARDIOVERSION N/A 09/08/2015   Procedure: TRANSESOPHAGEAL ECHOCARDIOGRAM (TEE);  Surgeon: Vesta Mixer, MD;  Location: [facility] ENDOSCOPY;  Service: Cardiovascular;  Laterality: N/A;   Patient Active Problem List   Diagnosis Date Noted   Fluid overload 05/12/2023   CHF (congestive heart failure) (HCC) 05/12/2023   Heart failure with improved ejection fraction (HFimpEF) (HCC) 04/24/2023   Lower extremity edema 04/24/2023   Cerebral aneurysm 04/17/2023   COVID-19 04/03/2023   Right sided weakness 04/03/2023   Abnormal gait 03/27/2023   Subdural hematoma (HCC) 03/26/2023  Head injury 02/13/2023   Fall 02/13/2023   Subdural hematoma due to concussion (HCC) 02/13/2023   Paroxysmal atrial fibrillation (HCC) 09/15/2021   Hypercoagulable state due to paroxysmal atrial fibrillation (HCC) 09/15/2021   AAA (abdominal aortic aneurysm) without rupture (HCC) 09/15/2021   History of bradycardia 10/21/2020   Spinal stenosis at L4-L5 level 08/09/2020   Insomnia 02/06/2018   Visit for monitoring Tikosyn therapy 01/10/2016   Atrial flutter (HCC) 08/07/2015   Bilateral carotid artery disease (HCC) 07/21/2013   CAD (coronary artery  disease) 01/26/2013   Chronic obstructive pulmonary disease (HCC) 01/26/2013   Essential hypertension, benign 01/26/2013   Hyperlipidemia 01/26/2013    PCP: Benita Stabile, MD  REFERRING PROVIDER: Donalee Citrin, MD  REFERRING DIAG: M48.07 (ICD-10-CM) - Spinal stenosis of lumbosacral region  Rationale for Evaluation and Treatment: Rehabilitation  THERAPY DIAG:  Weakness of right lower extremity  Difficulty in walking, not elsewhere classified  ONSET DATE: July 2024  SUBJECTIVE:                                                                                                                                                                                           SUBJECTIVE STATEMENT:  Pt reporting no issues over the weekend. Reports that he feels his balance is getting a little bit better.   EVAL: Arrives to the clinic with c/o L LE weakness. Denies any numbness on the legs. Patient reports that he doesn't have any strength on the legs. Due to the problem, patient states that it is difficult to squat. Denies worsening of leg weakness but the weakness can be more felt when he gets physically active. Fell January 31, 2023. Had L brain surgery on July 2024 due to bleeding in the brain. Weakness on the legs started since he had the brain surgery in July 2024. Patient denies any imaging on the legs or medications to address the issues. Patient was then referred to outpatient PT evaluation and management.   PERTINENT HISTORY:  Lumbar laminectomy 2021, L brain surgery  PAIN:  Are you having pain? No  PRECAUTIONS: Fall  RED FLAGS: None   WEIGHT BEARING RESTRICTIONS: No  FALLS:  Has patient fallen in last 6 months? No  LIVING ENVIRONMENT: Lives with: lives with their spouse Lives in: House/apartment Stairs: Yes: External: 3 steps; bilateral but cannot reach both Has following equipment at home: Dan Humphreys - 4 wheeled, shower chair, and Shower bench  OCCUPATION: retired  PLOF: Independent  and Independent with basic ADLs  PATIENT GOALS: "to try to see if the left leg is going to get stronger"  NEXT MD VISIT: April 2025  OBJECTIVE:  Note: Objective measures were completed at Evaluation unless otherwise noted.  DIAGNOSTIC FINDINGS:  None performed relevant to the area/complaint recently   PATIENT SURVEYS:  ABC scale 1040/1600 = 65%  COGNITION: Overall cognitive status: Within functional limits for tasks assessed     SENSATION: Light touch: WFL on B LE  MUSCLE LENGTH: Hamstrings: moderate restriction on B Thomas test: mild restriction on B Piriformis test: mild restriction on B  POSTURE: rounded shoulders, forward head, decreased lumbar lordosis, increased thoracic kyphosis, posterior pelvic tilt, and knees slightly flexed  LUMBAR ROM:   AROM eval  Flexion 50%  Extension 5%  Right lateral flexion   Left lateral flexion   Right rotation 75%  Left rotation 50%   (Blank rows = not tested)  LOWER EXTREMITY ROM:     Active  Right eval Left eval  Hip flexion [facility]  Hip extension [facility]  Hip abduction [facility]  Hip adduction    Hip internal rotation    Hip external rotation    Knee flexion [facility] WFL  Knee extension [facility]  Ankle dorsiflexion [facility] WFL  Ankle plantarflexion [facility] WFL  Ankle inversion    Ankle eversion     (Blank rows = not tested)  LOWER EXTREMITY MMT:    MMT Right eval Left eval  Hip flexion 4 4  Hip extension 4- 4  Hip abduction 3+ 4  Hip adduction    Hip internal rotation    Hip external rotation    Knee flexion 3+ 4  Knee extension 4- 4  Ankle dorsiflexion 3+ 4  Ankle plantarflexion    Ankle inversion    Ankle eversion     (Blank rows = not tested)  LUMBAR SPECIAL TESTS:  Straight leg raise test: positive for hamstring tightness  FUNCTIONAL TESTS:  5 times sit to stand: 11.41 sec 2 minute walk test: 464 ft Dynamic Gait Index: 14  DGI 1. Gait level surface (2) Mild Impairment: Walks 20', uses assistive  devices, slower speed, mild gait deviations. 2. Change in gait speed (2) Mild Impairment: Is able to change speed but demonstrates mild gait deviations, or not gait deviations but unable to achieve a significant change in velocity, or uses an assistive device. 3. Gait with horizontal head turns (1) Moderate Impairment: Performs head turns with moderate change in gait velocity, slows down, staggers but recovers, can continue to walk. 4. Gait with vertical head turns (2) Mild Impairment: Performs head turns smoothly with slight change in gait velocity, i.e., minor disruption to smooth gait path or uses walking aid. 5. Gait and pivot turn (2) Mild Impairment: Pivot turns safely in > 3 seconds and stops with no loss of balance. 6. Step over obstacle (2) Mild Impairment: Is able to step over box, but must slow down and adjust steps to clear box safely. 7. Step around obstacles (1) Moderate Impairment: Is able to clear cones but must significantly slow, speed to accomplish task, or requires verbal cueing. 8. Stairs (2) Mild Impairment: Alternating feet, must use rail.  TOTAL SCORE: 14 / 24  GAIT: Distance walked: 464 ft Assistive device utilized: None Level of assistance: Complete Independence Comments: done during , slight foot slap on R  TREATMENT DATE:  11/22/2023  -Tandem stance on aeromat in // bars 3 x 1' with lateral head turns- cues for speed and timing  -Single leg balance with intermittent UE touchdown 2 x 1' - // bars -40ft x 6 forward/backwards ambulation with tidal tank w/ CGA -  4 way stepping with tidal tank carry x 2 -Sit/stand x 12 with tidal tank and GTB around knees  11/19/2023  -Standing hip vectors 10x 3'' bilaterally with finger UE assist -Tandem stance on aeromat 2 x 1' -LLE step ups on 4in box and 2in blue foam mat with unilateral KB with 5 and 10lb carry 2 x 10 with CGA -20ft x 2 ambulation with 10lb tidal tank carry performing lateral head turns @ CGA  -71ft x  2 ambulation with 10lb tidal tank carry performing vertical head thrusts @ CGA  -5 lateral walk outs at bodycraft #2 plate-CGA -5 backwards walk outs at bodycraft #2 palte- CGA -SL balance 1 x 1' bilaterally without UE support intermittent touch down.   11/14/23 Vitals seated 116/73, HR 81 Standing:  heelraises 20X   Knee flexion 20X each 3" holds  Hip abduction 2X10  Hip extension 2X10  Lunges onto 4" step no UE 2X10 STS with 4" box under Lt LE, tidal tank sphere 10X, CGA   PATIENT EDUCATION:  Education details: Educated on the goals and course of rehab. Educated on measures to reduce falls at home. Person educated: Patient Education method: Explanation Education comprehension: verbalized understanding  HOME EXERCISE PROGRAM: Access Code: [email or web address] URL: [email or web address] Date: 11/01/2023 Prepared by: Becky Sax  Exercises - Sit to Stand with Hands on Knees  - 2 x daily - 7 x weekly - 2 sets - 10 reps - Supine Bridge  - 1 x daily - 7 x weekly - 3 sets - 10 reps - 5" hold - Clam with Resistance  - 1 x daily - 7 x weekly - 3 sets - 10 reps - 5" hold - Standing Tandem Balance with Counter Support  - 1 x daily - 7 x weekly - 1 sets - 3 reps - 30" hold - Heel Toe Raises with Counter Support  - 1 x daily - 7 x weekly - 3 sets - 10 reps - 5" hold  Access Code: [email or web address] URL: [email or web address] Date: 11/06/2023 Prepared by: Starling Manns  Exercises - Standing Single Leg Stance with Counter Support  - 1 x daily - 7 x weekly - 3 sets - 10 reps - Standing Knee Flexion with Counter Support  - 1 x daily - 7 x weekly - 3 sets - 12-15 reps - 3-5 hold  ASSESSMENT:  CLINICAL IMPRESSION: Pt tolerating therapy session. Progressing balance reactions with increasing proprioception training and vestibular input. Pt continues with slow, guarded movements when involving head movements during ambulation. Tolerated tidal tank challenges well. Gluteal weakness  continuing to cause minor missteps when planting feet. Advanced HEP with progressing hip abduction strength as well.  Pt will continued to benefit from skilled therapy to progress towards goals.     EVAL: Patient is a 78 y.o. male who was seen today for physical therapy evaluation and treatment for lumbar stenosis. Patient was diagnosed with lumbar by referring provider further defined by difficulty with squatting due to weakness, impaired balance, and decreased soft tissue extensibility. Patient reports that his L LE is weak but per objective findings, patient's R LE is weaker than the L which could be attributed from surgery on the L side of the brain. Skilled PT is required to address the impairments and functional limitations listed below.    OBJECTIVE IMPAIRMENTS: Abnormal gait, decreased activity tolerance, decreased balance, difficulty walking, decreased ROM, decreased strength, and impaired flexibility.   ACTIVITY LIMITATIONS: carrying, lifting, bending, standing, squatting, and stairs  PARTICIPATION LIMITATIONS: meal  prep, cleaning, laundry, driving, shopping, community activity, and yard work  PERSONAL FACTORS: Age and Time since onset of injury/illness/exacerbation are also affecting patient's functional outcome.   REHAB POTENTIAL: Fair    CLINICAL DECISION MAKING: Stable/uncomplicated  EVALUATION COMPLEXITY: Low   GOALS: Goals reviewed with patient? Yes  SHORT TERM GOALS: Target date: 11/13/23  Pt will demonstrate indep in HEP to facilitate carry-over of skilled services and improve functional outcomes Goal status: INITIAL  LONG TERM GOALS: Target date: 11/27/23  Pt will improve ABC scale score by 10% in order to demonstrate improved confidence with balance and safety during ambulation and other ADLs  Baseline: 65% Goal status: INITIAL  2.  Pt will increase by at least 40 ft in order to demonstrate clinically significant improvement in community ambulation Baseline:  464 ft Goal status: INITIAL  3.  Pt will improve DGI by at least 3 points in order to demonstrate clinically significant improvement in balance and decreased risk for falls Baseline: 14 Goal status: INITIAL  4.  Pt will demonstrate increase in LE strength to 4-/5 to facilitate ease and safety in ambulation Baseline: 3+/5 Goal status: INITIAL  PLAN:  PT FREQUENCY: 2x/week  PT DURATION: 4 weeks  PLANNED INTERVENTIONS: [id number]- PT Re-evaluation, 97110-Therapeutic exercises, 97530- Therapeutic activity, [id number]- Neuromuscular re-education, 97535- Self Care, [id number]- Manual therapy, [phone]- Gait training, Patient/Family education, and Stair training.  PLAN FOR NEXT SESSION: contiinue LE strengthening, flexibility, balance, and gait activities.  Nelida Meuse PT, DPT [facility] [place] [phone] office  11/22/2023, 9:24 AM

## 2023-11-27 ENCOUNTER — Encounter (HOSPITAL_COMMUNITY): Payer: Self-pay

## 2023-11-27 ENCOUNTER — Ambulatory Visit (HOSPITAL_COMMUNITY): Attending: Neurosurgery

## 2023-11-27 DIAGNOSIS — R29898 Other symptoms and signs involving the musculoskeletal system: Secondary | ICD-10-CM | POA: Insufficient documentation

## 2023-11-27 DIAGNOSIS — R262 Difficulty in walking, not elsewhere classified: Secondary | ICD-10-CM | POA: Insufficient documentation

## 2023-11-27 NOTE — Therapy (Signed)
 OUTPATIENT PHYSICAL THERAPY THORACOLUMBAR TREATMENT  Progress Note Reporting Period 10/30/23 to 11/27/23  See note below for Objective Data and Assessment of Progress/Goals.    Patient Name: [name] MRN: [id number] DOB:May 04, 1946, 78 y.o., male Today's Date: 11/27/2023  END OF SESSION:  PT End of Session - 11/27/23 1151     Visit Number 8    Number of Visits 8    Date for PT Re-Evaluation 11/27/23    Authorization Type Medicare Part A/B    Progress Note Due on Visit 8    PT Start Time 1152    PT Stop Time 1239    PT Time Calculation (min) 47 min    Equipment Utilized During Treatment Gait belt    Activity Tolerance Patient tolerated treatment well    Behavior During Therapy [facility] for tasks assessed/performed                 Past Medical History:  Diagnosis Date   AAA (abdominal aortic aneurysm) (HCC)    06/27/20 MRI: 3.0 cm infrarenal AAA, recommend 3 year follow-up   Anxiety    Atrial fibrillation (HCC)    Bilateral carotid artery disease (HCC) 07/21/2013   CAD (coronary artery disease) 2008   CABG   COPD (chronic obstructive pulmonary disease) (HCC)    Dysrhythmia    A-fib   Gastritis    Headache(784.0)    History of hiatal hernia    Hypertension    SDH (subdural hematoma) (HCC) 01/31/2023   Visit for monitoring Tikosyn therapy 12/2015   Past Surgical History:  Procedure Laterality Date   BURR HOLE Left 03/27/2023   Procedure: BURR HOLE EVASCUATION OF SUBDURAL HEMATOMA;  Surgeon: Jadene Pierini, MD;  Location: [facility] OR;  Service: Neurosurgery;  Laterality: Left;   CARDIOVERSION N/A 09/08/2015   Procedure: CARDIOVERSION;  Surgeon: Vesta Mixer, MD;  Location: [facility] ENDOSCOPY;  Service: Cardiovascular;  Laterality: N/A;   CORONARY ARTERY BYPASS GRAFT     EXCISION OF KELOID Left 10/08/2018   Procedure: EXCISION OF CHRONIC ABDOMINAL WALL WOUND;  Surgeon: Abigail Miyamoto, MD;  Location: [facility];  Service: General;  Laterality: Left;    INCISION AND DRAINAGE DEEP NECK ABSCESS     IR ANGIO EXTERNAL CAROTID SEL EXT CAROTID UNI L MOD SED  04/17/2023   IR ANGIO INTRA EXTRACRAN SEL COM CAROTID INNOMINATE UNI L MOD SED  04/17/2023   IR NEURO EACH ADD'L AFTER BASIC UNI LEFT (MS)  04/17/2023   LUMBAR LAMINECTOMY/DECOMPRESSION MICRODISCECTOMY Right 08/09/2020   Procedure: Laminectomy and Foraminotomy - Lumbar three-Lumbar four - Lumbar four-Lumbar five- right;  Surgeon: Donalee Citrin, MD;  Location: [facility] OR;  Service: Neurosurgery;  Laterality: Right;   RADIOLOGY WITH ANESTHESIA N/A 04/17/2023   Procedure: Left MMA;  Surgeon: Lisbeth Renshaw, MD;  Location: [facility] OR;  Service: Radiology;  Laterality: N/A;   ROTATOR CUFF REPAIR Bilateral    TEE WITHOUT CARDIOVERSION N/A 08/02/2015   Procedure: TRANSESOPHAGEAL ECHOCARDIOGRAM (TEE);  Surgeon: Quintella Reichert, MD;  Location: [facility] ENDOSCOPY;  Service: Cardiovascular;  Laterality: N/A;   TEE WITHOUT CARDIOVERSION N/A 09/08/2015   Procedure: TRANSESOPHAGEAL ECHOCARDIOGRAM (TEE);  Surgeon: Vesta Mixer, MD;  Location: [facility] ENDOSCOPY;  Service: Cardiovascular;  Laterality: N/A;   Patient Active Problem List   Diagnosis Date Noted   Fluid overload 05/12/2023   CHF (congestive heart failure) (HCC) 05/12/2023   Heart failure with improved ejection fraction (HFimpEF) (HCC) 04/24/2023   Lower extremity edema 04/24/2023   Cerebral aneurysm 04/17/2023  COVID-19 04/03/2023   Right sided weakness 04/03/2023   Abnormal gait 03/27/2023   Subdural hematoma (HCC) 03/26/2023   Head injury 02/13/2023   Fall 02/13/2023   Subdural hematoma due to concussion (HCC) 02/13/2023   Paroxysmal atrial fibrillation (HCC) 09/15/2021   Hypercoagulable state due to paroxysmal atrial fibrillation (HCC) 09/15/2021   AAA (abdominal aortic aneurysm) without rupture (HCC) 09/15/2021   History of bradycardia 10/21/2020   Spinal stenosis at L4-L5 level 08/09/2020   Insomnia 02/06/2018   Visit for monitoring Tikosyn therapy  01/10/2016   Atrial flutter (HCC) 08/07/2015   Bilateral carotid artery disease (HCC) 07/21/2013   CAD (coronary artery disease) 01/26/2013   Chronic obstructive pulmonary disease (HCC) 01/26/2013   Essential hypertension, benign 01/26/2013   Hyperlipidemia 01/26/2013    PCP: Benita Stabile, MD  REFERRING PROVIDER: Donalee Citrin, MD  REFERRING DIAG: M48.07 (ICD-10-CM) - Spinal stenosis of lumbosacral region  Rationale for Evaluation and Treatment: Rehabilitation  THERAPY DIAG:  Weakness of right lower extremity  Difficulty in walking, not elsewhere classified  ONSET DATE: July 2024  SUBJECTIVE:                                                                                                                                                                                           SUBJECTIVE STATEMENT: Pt stated no issues or reports of pain.  Reports he has occasional pain in Lt hip, most at night when laying on Rt side.  Feels he has made some improvements with therapy, no reports of recent falls.   EVAL: Arrives to the clinic with c/o L LE weakness. Denies any numbness on the legs. Patient reports that he doesn't have any strength on the legs. Due to the problem, patient states that it is difficult to squat. Denies worsening of leg weakness but the weakness can be more felt when he gets physically active. Fell January 31, 2023. Had L brain surgery on July 2024 due to bleeding in the brain. Weakness on the legs started since he had the brain surgery in July 2024. Patient denies any imaging on the legs or medications to address the issues. Patient was then referred to outpatient PT evaluation and management.   PERTINENT HISTORY:  Lumbar laminectomy 2021, L brain surgery  PAIN:  Are you having pain? No  PRECAUTIONS: Fall  RED FLAGS: None   WEIGHT BEARING RESTRICTIONS: No  FALLS:  Has patient fallen in last 6 months? No  LIVING ENVIRONMENT: Lives with: lives with their spouse Lives  in: House/apartment Stairs: Yes: External: 3 steps; bilateral but cannot reach both Has following equipment at home: Dan Humphreys - 4 wheeled,  shower chair, and Shower bench  OCCUPATION: retired  PLOF: Independent and Independent with basic ADLs  PATIENT GOALS: "to try to see if the left leg is going to get stronger"  NEXT MD VISIT: April 2025  OBJECTIVE:  Note: Objective measures were completed at Evaluation unless otherwise noted.  DIAGNOSTIC FINDINGS:  None performed relevant to the area/complaint recently   PATIENT SURVEYS:  ABC scale 1040/1600 = 65%  COGNITION: Overall cognitive status: Within functional limits for tasks assessed     SENSATION: Light touch: WFL on B LE  MUSCLE LENGTH: Hamstrings: moderate restriction on B Thomas test: mild restriction on B Piriformis test: mild restriction on B  POSTURE: rounded shoulders, forward head, decreased lumbar lordosis, increased thoracic kyphosis, posterior pelvic tilt, and knees slightly flexed  LUMBAR ROM:   AROM eval  Flexion 50%  Extension 5%  Right lateral flexion   Left lateral flexion   Right rotation 75%  Left rotation 50%   (Blank rows = not tested)  LOWER EXTREMITY ROM:     Active  Right eval Left eval  Hip flexion [facility]  Hip extension [facility]  Hip abduction [facility]  Hip adduction    Hip internal rotation    Hip external rotation    Knee flexion [facility]  Knee extension [facility]  Ankle dorsiflexion [facility]  Ankle plantarflexion [facility] WFL  Ankle inversion    Ankle eversion     (Blank rows = not tested)  LOWER EXTREMITY MMT:    MMT Right eval Left eval Right 11/27/23 Left  11/27/23  Hip flexion 4 4 5  4+  Hip extension 4- 4 4- 4  Hip abduction 3+ 4 4+ 4+  Hip adduction      Hip internal rotation      Hip external rotation      Knee flexion 3+ 4 4 4+  Knee extension 4- 4 5 5   Ankle dorsiflexion 3+ 4 4- 4  Ankle plantarflexion      Ankle inversion      Ankle eversion       (Blank rows = not  tested)  LUMBAR SPECIAL TESTS:  Straight leg raise test: positive for hamstring tightness  FUNCTIONAL TESTS:  5 times sit to stand: 11.41 sec 2 minute walk test: 464 ft Dynamic Gait Index: 14  11/27/23: 419ft no AD 5 STS 10.35" DFI 23/24  DGI 1. Gait level surface (2) Mild Impairment: Walks 20', uses assistive devices, slower speed, mild gait deviations. 2. Change in gait speed (2) Mild Impairment: Is able to change speed but demonstrates mild gait deviations, or not gait deviations but unable to achieve a significant change in velocity, or uses an assistive device. 3. Gait with horizontal head turns (1) Moderate Impairment: Performs head turns with moderate change in gait velocity, slows down, staggers but recovers, can continue to walk. 4. Gait with vertical head turns (2) Mild Impairment: Performs head turns smoothly with slight change in gait velocity, i.e., minor disruption to smooth gait path or uses walking aid. 5. Gait and pivot turn (2) Mild Impairment: Pivot turns safely in > 3 seconds and stops with no loss of balance. 6. Step over obstacle (2) Mild Impairment: Is able to step over box, but must slow down and adjust steps to clear box safely. 7. Step around obstacles (1) Moderate Impairment: Is able to clear cones but must significantly slow, speed to accomplish task, or requires verbal cueing. 8. Stairs (2) Mild Impairment: Alternating feet, must  use rail.  TOTAL SCORE: 14 / 24  GAIT: Distance walked: 464 ft Assistive device utilized: None Level of assistance: Complete Independence Comments: done during , slight foot slap on R  TREATMENT DATE:  11/27/23: 491ft no AD 5 STS 10.35" MMT see above  DGI 1. Gait level surface (3) Normal: Walks 20', no assistive devices, good sped, no evidence for imbalance, normal gait pattern 2. Change in gait speed (3) Normal: Able to smoothly change walking speed without loss of balance or gait deviation. Shows a  significant difference in walking speeds between normal, fast and slow speeds. 3. Gait with horizontal head turns (2) Mild Impairment: Performs head turns smoothly with slight change in gait velocity, i.e., minor disruption to smooth gait path or uses walking aid. 4. Gait with vertical head turns (3) Normal: Performs head turns smoothly with no change in gait. 5. Gait and pivot turn (3) Normal: Pivot turns safely within 3 seconds and stops quickly with no loss of balance. 6. Step over obstacle (3) Normal: Is able to step over the box without changing gait speed, no evidence of imbalance. 7. Step around obstacles (3) Normal: Is able to walk around cones safely without changing gait speed; no evidence of imbalance. 8. Stairs (3) Normal: Alternating feet, no rail.  TOTAL SCORE: 23 / 24     11/22/2023  -Tandem stance on aeromat in // bars 3 x 1' with lateral head turns- cues for speed and timing  -Single leg balance with intermittent UE touchdown 2 x 1' - // bars -72ft x 6 forward/backwards ambulation with tidal tank w/ CGA -4 way stepping with tidal tank carry x 2 -Sit/stand x 12 with tidal tank and GTB around knees  11/19/2023  -Standing hip vectors 10x 3'' bilaterally with finger UE assist -Tandem stance on aeromat 2 x 1' -LLE step ups on 4in box and 2in blue foam mat with unilateral KB with 5 and 10lb carry 2 x 10 with CGA -38ft x 2 ambulation with 10lb tidal tank carry performing lateral head turns @ CGA  -61ft x 2 ambulation with 10lb tidal tank carry performing vertical head thrusts @ CGA  -5 lateral walk outs at bodycraft #2 plate-CGA -5 backwards walk outs at bodycraft #2 palte- CGA -SL balance 1 x 1' bilaterally without UE support intermittent touch down.   11/14/23 Vitals seated 116/73, HR 81 Standing:  heelraises 20X   Knee flexion 20X each 3" holds  Hip abduction 2X10  Hip extension 2X10  Lunges onto 4" step no UE 2X10 STS with 4" box under Lt LE, tidal tank sphere  10X, CGA   PATIENT EDUCATION:  Education details: Educated on the goals and course of rehab. Educated on measures to reduce falls at home. Person educated: Patient Education method: Explanation Education comprehension: verbalized understanding  HOME EXERCISE PROGRAM: Access Code: [email or web address] URL: [email or web address] Date: 11/01/2023 Prepared by: Becky Sax  Exercises - Sit to Stand with Hands on Knees  - 2 x daily - 7 x weekly - 2 sets - 10 reps - Supine Bridge  - 1 x daily - 7 x weekly - 3 sets - 10 reps - 5" hold - Clam with Resistance  - 1 x daily - 7 x weekly - 3 sets - 10 reps - 5" hold - Standing Tandem Balance with Counter Support  - 1 x daily - 7 x weekly - 1 sets - 3 reps - 30" hold - Heel Toe Raises with Counter Support  - 1  x daily - 7 x weekly - 3 sets - 10 reps - 5" hold  Access Code: [email or web address] URL: [email or web address] Date: 11/06/2023 Prepared by: Starling Manns  Exercises - Standing Single Leg Stance with Counter Support  - 1 x daily - 7 x weekly - 3 sets - 10 reps - Standing Knee Flexion with Counter Support  - 1 x daily - 7 x weekly - 3 sets - 12-15 reps - 3-5 hold  ASSESSMENT:  CLINICAL IMPRESSION: Reviewed goals with the following findings:  Pt has met 1/1 STG and 3/4 LTGs.  Pt presents with compliance 3 days a week with current exercise program.  Objective testing findings include increased strength, balance and confidence with ABC confidence.  Only goal not met was which began session with and pt talking during test, therapist feels the discussion limited cadence.  Reviewed findings and discussed with pt.  Pt appropriate to DC to HEP.  Pt encouraged to continue with HEP following DC with verbalized understanding.  EVAL: Patient is a 78 y.o. male who was seen today for physical therapy evaluation and treatment for lumbar stenosis. Patient was diagnosed with lumbar by referring provider further defined by difficulty with  squatting due to weakness, impaired balance, and decreased soft tissue extensibility. Patient reports that his L LE is weak but per objective findings, patient's R LE is weaker than the L which could be attributed from surgery on the L side of the brain. Skilled PT is required to address the impairments and functional limitations listed below.    OBJECTIVE IMPAIRMENTS: Abnormal gait, decreased activity tolerance, decreased balance, difficulty walking, decreased ROM, decreased strength, and impaired flexibility.   ACTIVITY LIMITATIONS: carrying, lifting, bending, standing, squatting, and stairs  PARTICIPATION LIMITATIONS: meal prep, cleaning, laundry, driving, shopping, community activity, and yard work  PERSONAL FACTORS: Age and Time since onset of injury/illness/exacerbation are also affecting patient's functional outcome.   REHAB POTENTIAL: Fair    CLINICAL DECISION MAKING: Stable/uncomplicated  EVALUATION COMPLEXITY: Low   GOALS: Goals reviewed with patient? Yes  SHORT TERM GOALS: Target date: 11/13/23  Pt will demonstrate indep in HEP to facilitate carry-over of skilled services and improve functional outcomes Baseline:  11/27/23:  Reports compliance with HEP 2-3 days a week Goal status: MET  LONG TERM GOALS: Target date: 11/27/23  Pt will improve ABC scale score by 10% in order to demonstrate improved confidence with balance and safety during ambulation and other ADLs  Baseline: 65%; 11/27/23: 92.5% Goal status: MET  2.  Pt will increase by at least 40 ft in order to demonstrate clinically significant improvement in community ambulation Baseline: 464 ft; 11/27/23: 470ft Goal status: On going  3.  Pt will improve DGI by at least 3 points in order to demonstrate clinically significant improvement in balance and decreased risk for falls Baseline: 14; 11/27/23: 23/24 Goal status: MET  4.  Pt will demonstrate increase in LE strength to 4-/5 to facilitate ease and safety in  ambulation Baseline: 3+/5; 11/27/23: see above Goal status: MET  PLAN:  PT FREQUENCY: 2x/week  PT DURATION: 4 weeks  PLANNED INTERVENTIONS: 97164- PT Re-evaluation, 97110-Therapeutic exercises, 97530- Therapeutic activity, [id number]- Neuromuscular re-education, 97535- Self Care, [id number]- Manual therapy, [phone]- Gait training, Patient/Family education, and Stair training.  PLAN FOR NEXT SESSION: DC to HEP.  Becky Sax, LPTA/CLT; [place] [phone]   11/27/2023, 2:15 PM

## 2023-11-29 ENCOUNTER — Encounter (HOSPITAL_COMMUNITY)

## 2023-12-03 DIAGNOSIS — I480 Paroxysmal atrial fibrillation: Secondary | ICD-10-CM | POA: Diagnosis not present

## 2023-12-03 DIAGNOSIS — I10 Essential (primary) hypertension: Secondary | ICD-10-CM | POA: Diagnosis not present

## 2023-12-04 ENCOUNTER — Telehealth: Payer: Self-pay | Admitting: Cardiology

## 2023-12-04 LAB — BASIC METABOLIC PANEL WITH GFR
BUN/Creatinine Ratio: 15 (ref 10–24)
BUN: 20 mg/dL (ref 8–27)
CO2: 22 mmol/L (ref 20–29)
Calcium: 9.5 mg/dL (ref 8.6–10.2)
Chloride: 104 mmol/L (ref 96–106)
Creatinine, Ser: 1.3 mg/dL — ABNORMAL HIGH (ref 0.76–1.27)
Glucose: 92 mg/dL (ref 70–99)
Potassium: 4.7 mmol/L (ref 3.5–5.2)
Sodium: 144 mmol/L (ref 134–144)
eGFR: 56 mL/min/{1.73_m2} — ABNORMAL LOW (ref >59)

## 2023-12-04 NOTE — Telephone Encounter (Signed)
 Patient's wife is calling back stating she is leaving for an appt and will not be back until 4:00, so she would like a callback at 4:30 on her cell.  She reports she does not do VM on her cell phone, so if she does not answer please call the land line to leave a VM instead.  Please advise.

## 2023-12-04 NOTE — Telephone Encounter (Signed)
 The majority of these blood pressures are at goal.  Continue current medications. Tereso Newcomer, PA-C    12/04/2023 1:18 PM

## 2023-12-04 NOTE — Telephone Encounter (Signed)
 Wife Joyce Gross) called to report patient's BP readings as requested by RN Pam.  3/25 - 125/60  HR 91 3/26 - 145/77  HR 83 3/27 - 134/69  HR 94 3/28 - 131/80  HR 99 3/29 - 133/64  HR 97 3/30 - 140/99  HR 137 3/31 - 147/86  HR 105 4/1 -   125/57  HR 115 4/2 -   136/67  HR 84 4/3 -   163/80  HR 93 4/4 -   136/84  HR 82 4/5 -   120/66  HR 86 4/6 -   131/76  HR 93 4/7 -   150/74  HR 115  Patient noted she will be leaving for an appointment at 1:00 pm.

## 2023-12-04 NOTE — Telephone Encounter (Signed)
 Spoke with wife who is aware to continue current medications and to continue to monitor blood pressures.

## 2023-12-05 NOTE — Progress Notes (Signed)
 Pt has been made aware of normal result and verbalized understanding.  jw

## 2023-12-10 ENCOUNTER — Ambulatory Visit: Payer: Medicare Other

## 2023-12-23 NOTE — Progress Notes (Unsigned)
 Electrophysiology Office Follow up Visit Note:    Date:  12/24/2023   ID:  Ian, Moyer [birth year], MRN [id number]  PCP:  Omie Bickers, MD  [facility] Cardiologist:  Dorothye Gathers, MD  [facility] Electrophysiologist:  Boyce Byes, MD    Interval History:     [name] is a 78 y.o. male who presents for a follow up visit.   I last saw the patient August 28, 2023.  The patient has a history of coronary artery disease, heart failure and atrial fibrillation.  He had a prior fall complicated by subdural hematoma while on Xarelto .  This required a bur hole for hematoma evacuation.  His atrial fibrillation is permanent. At the last appointment we discussed watchman and he was interested in proceeding.  We were awaiting neurology clearance prior to proceeding.  The patient was seen by Gearl Keens in neurosurgery in February of this year.  Dr. Lamon Pillow provided clearance for him to undergo evaluation for watchman and to use anticoagulation around the time of the procedure.  He has been doing well since I last saw him.  He is interested in proceeding with watchman evaluation in an effort to avoid indefinite exposure anticoagulation while protecting against stroke.      Past medical, surgical, social and family history were reviewed.  ROS:   Please see the history of present illness.    All other systems reviewed and are negative.  EKGs/Labs/Other Studies Reviewed:    The following studies were reviewed today:  May 13, 2023 echo EF 55-60 RV looked normal Trivial MR Mild to moderate TR         Physical Exam:    VS:  BP 133/80   Pulse 81   Ht 5\' 5"  (1.651 m)   SpO2 95%   BMI 24.10 kg/m     Wt Readings from Last 3 Encounters:  08/28/23 144 lb 12.8 oz (65.7 kg)  07/16/23 143 lb (64.9 kg)  06/28/23 138 lb 3.2 oz (62.7 kg)     GEN: no distress.  Elderly CARD: Irregularly irregular, No MRG RESP: No IWOB. CTAB.      ASSESSMENT:    1.  Permanent atrial fibrillation (HCC)   2. History of subdural hematoma    PLAN:    In order of problems listed above:  #Atrial fibrillation #History of subdural hematoma, traumatic Have previously seen the patient to discuss Watchman implant as a stroke risk mitigation strategy given his history of subdural hematoma.  Since I last saw him he has been evaluated by neurosurgery who cleared him to restart a short course of anticoagulation around the time of watchman implant.  I think he is at acceptable risk to undergo the procedure although he is certainly at an increased risk of subdural hematoma compared to the general population.  He understands this risk and wishes to proceed.  ------------  I have seen Tucker Gails in the office today who is being considered for a Watchman left atrial appendage closure device. I believe they will benefit from this procedure given their history of atrial fibrillation, CHA2DS2-VASc score of 5. Unfortunately, the patient is not felt to be a long term anticoagulation candidate secondary to history of intracranial hemorrhage (SDH). The patient's chart has been reviewed and I feel that they would be a candidate for short term oral anticoagulation after Watchman implant.    It is my belief that after undergoing a LAA closure procedure, Tucker Gails will not  need long term anticoagulation which eliminates anticoagulation side effects and major bleeding risk.    Procedural risks for the Watchman implant have been reviewed with the patient including a 0.5% risk of stroke, <1% risk of perforation and <1% risk of device embolization. Other risks include bleeding, vascular damage, tamponade, worsening renal function, and death. The patient understands these risk and wishes to proceed.       The published clinical data on the safety and effectiveness of WATCHMAN include but are not limited to the following: - Holmes DR, Evalene Hilda, Sick P et al. for the PROTECT AF  Investigators. Percutaneous closure of the left atrial appendage versus warfarin therapy for prevention of stroke in patients with atrial fibrillation: a randomised non-inferiority trial. Lancet 2009; 374: 534-42. Evalene Hilda, Doshi SK, Deloria Fetch D et al. on behalf of the PROTECT AF Investigators. Percutaneous Left Atrial Appendage Closure for Stroke Prophylaxis in Patients With Atrial Fibrillation 2.3-Year Follow-up of the PROTECT AF (Watchman Left Atrial Appendage System for Embolic Protection in Patients With Atrial Fibrillation) Trial. Circulation 2013; 127:720-729. - Alli O, Doshi S,  Kar S, Reddy VY, Sievert H et al. Quality of Life Assessment in the Randomized PROTECT AF (Percutaneous Closure of the Left Atrial Appendage Versus Warfarin Therapy for Prevention of Stroke in Patients With Atrial Fibrillation) Trial of Patients at Risk for Stroke With Nonvalvular Atrial Fibrillation. J Am Coll Cardiol 2013; 61:1790-8. Bartholome Ligas DR, Mario Sicard, Price M, Whisenant B, Sievert H, Doshi S, Huber K, Reddy V. Prospective randomized evaluation of the Watchman left atrial appendage Device in patients with atrial fibrillation versus long-term warfarin therapy; the PREVAIL trial. Journal of the [employer] of Cardiology, Vol. 4, No. 1, 2014, 1-11. - Kar S, Doshi SK, Sadhu A, Horton R, Osorio J et al. Primary outcome evaluation of a next-generation left atrial appendage closure device: results from the PINNACLE FLX trial. Circulation 2021;143(18)1754-1762.      After today's visit with the patient which was dedicated solely for shared decision making visit regarding LAA closure device, the patient decided to proceed with the LAA appendage closure procedure scheduled to be done in the near future at [facility].   Will avoid CT scanning given previous cross-sectional imaging and slightly elevated creatinine.     HAS-BLED score 3 Hypertension Yes  Abnormal renal and liver function (Dialysis,  transplant, Cr >2.26 mg/dL /Cirrhosis or Bilirubin >2x Normal or AST/ALT/AP >3x Normal) No  Stroke No  Bleeding Yes  Labile INR (Unstable/high INR) No  Elderly (>65) Yes  Drugs or alcohol (>= 8 drinks/week, anti-plt or NSAID) No    CHA2DS2-VASc Score = 5  The patient's score is based upon: CHF History: 1 HTN History: 1 Diabetes History: 0 Stroke History: 0 Vascular Disease History: 1 Age Score: 2 Gender Score: 0   He will need to start anticoagulation 2 week prior to the procedure and continue for at least 45 days following the procedure.   Signed, Harvie Liner, MD, [facility], [facility] 12/24/2023 1:46 PM    Electrophysiology  [facility]

## 2023-12-24 ENCOUNTER — Ambulatory Visit: Payer: Medicare Other | Attending: Cardiology | Admitting: Cardiology

## 2023-12-24 ENCOUNTER — Encounter: Payer: Self-pay | Admitting: Cardiology

## 2023-12-24 VITALS — BP 133/80 | HR 81 | Ht 65.0 in

## 2023-12-24 DIAGNOSIS — I4821 Permanent atrial fibrillation: Secondary | ICD-10-CM | POA: Diagnosis not present

## 2023-12-24 DIAGNOSIS — Z8679 Personal history of other diseases of the circulatory system: Secondary | ICD-10-CM | POA: Insufficient documentation

## 2023-12-24 MED ORDER — METOPROLOL SUCCINATE ER 25 MG PO TB24: 25.0000 mg | ORAL_TABLET | Freq: Two times a day (BID) | ORAL | 3 refills | Status: AC

## 2023-12-24 NOTE — Patient Instructions (Signed)
 Medication Instructions:  Your physician has recommended you make the following change in your medication:  1) DECREASE metoprolol  to 25 mg twice daily  *If you need a refill on your cardiac medications before your next appointment, please call your pharmacy*  Testing/Procedures: Watchman  Your physician has requested that you have Left atrial appendage (LAA) closure device implantation is a procedure to put a small device in the LAA of the heart. The LAA is a small sac in the wall of the heart's left upper chamber. Blood clots can form in this area. The device, Watchman closes the LAA to help prevent a blood clot and stroke.    Follow-Up: At [facility], you and your health needs are our priority.  As part of our continuing mission to provide you with exceptional heart care, our providers are all part of one team.  This team includes your primary Cardiologist (physician) and Advanced Practice Providers or APPs (Physician Assistants and Nurse Practitioners) who all work together to provide you with the care you need, when you need it.  Your next appointment:   You will be contacted by Nurse Navigator, Larkin Plumb to schedule your pre-procedure visit and procedure date. If you have any questions she can be reached at [phone].

## 2024-01-10 DIAGNOSIS — E782 Mixed hyperlipidemia: Secondary | ICD-10-CM | POA: Diagnosis not present

## 2024-01-10 DIAGNOSIS — E1169 Type 2 diabetes mellitus with other specified complication: Secondary | ICD-10-CM | POA: Diagnosis not present

## 2024-01-10 LAB — LAB REPORT - SCANNED
A1c: 6.4
Alb/Creat Ratio, Ur: 54 mg/g{creat}
Albumin, Urine POC: 23.6
Creatinine, POC: 43.7 mg/dL
EGFR (Non-African Amer.): 54

## 2024-01-16 DIAGNOSIS — E1169 Type 2 diabetes mellitus with other specified complication: Secondary | ICD-10-CM | POA: Diagnosis not present

## 2024-01-16 DIAGNOSIS — S065X0D Traumatic subdural hemorrhage without loss of consciousness, subsequent encounter: Secondary | ICD-10-CM | POA: Diagnosis not present

## 2024-01-16 DIAGNOSIS — I480 Paroxysmal atrial fibrillation: Secondary | ICD-10-CM | POA: Diagnosis not present

## 2024-01-16 DIAGNOSIS — I25810 Atherosclerosis of coronary artery bypass graft(s) without angina pectoris: Secondary | ICD-10-CM | POA: Diagnosis not present

## 2024-01-16 DIAGNOSIS — I6529 Occlusion and stenosis of unspecified carotid artery: Secondary | ICD-10-CM | POA: Diagnosis not present

## 2024-01-16 DIAGNOSIS — I5022 Chronic systolic (congestive) heart failure: Secondary | ICD-10-CM | POA: Diagnosis not present

## 2024-01-16 DIAGNOSIS — F411 Generalized anxiety disorder: Secondary | ICD-10-CM | POA: Diagnosis not present

## 2024-01-16 DIAGNOSIS — D649 Anemia, unspecified: Secondary | ICD-10-CM | POA: Diagnosis not present

## 2024-01-16 DIAGNOSIS — E782 Mixed hyperlipidemia: Secondary | ICD-10-CM | POA: Diagnosis not present

## 2024-01-16 DIAGNOSIS — N1831 Chronic kidney disease, stage 3a: Secondary | ICD-10-CM | POA: Diagnosis not present

## 2024-01-16 DIAGNOSIS — I1310 Hypertensive heart and chronic kidney disease without heart failure, with stage 1 through stage 4 chronic kidney disease, or unspecified chronic kidney disease: Secondary | ICD-10-CM | POA: Diagnosis not present

## 2024-02-20 ENCOUNTER — Telehealth: Payer: Self-pay

## 2024-02-20 NOTE — Telephone Encounter (Signed)
 The patient understood that the Fall schedule is still being finalized and he will be the first patient called when it is published to arrange LAAO. He was grateful for call and agreed with plan.

## 2024-02-22 DIAGNOSIS — R030 Elevated blood-pressure reading, without diagnosis of hypertension: Secondary | ICD-10-CM | POA: Diagnosis not present

## 2024-02-22 DIAGNOSIS — Z6823 Body mass index (BMI) 23.0-23.9, adult: Secondary | ICD-10-CM | POA: Diagnosis not present

## 2024-02-22 DIAGNOSIS — B029 Zoster without complications: Secondary | ICD-10-CM | POA: Diagnosis not present

## 2024-03-04 DIAGNOSIS — R350 Frequency of micturition: Secondary | ICD-10-CM | POA: Insufficient documentation

## 2024-03-06 ENCOUNTER — Telehealth: Payer: Self-pay

## 2024-03-06 ENCOUNTER — Ambulatory Visit
Admission: EM | Admit: 2024-03-06 | Discharge: 2024-03-06 | Disposition: A | Discharge status: Home / Self Care | Attending: Family Medicine | Admitting: Family Medicine

## 2024-03-06 DIAGNOSIS — R350 Frequency of micturition: Secondary | ICD-10-CM

## 2024-03-06 DIAGNOSIS — R102 Pelvic and perineal pain: Secondary | ICD-10-CM | POA: Diagnosis not present

## 2024-03-06 LAB — POCT URINALYSIS DIP (MANUAL ENTRY)
Bilirubin, UA: NEGATIVE
Blood, UA: NEGATIVE
Glucose, UA: 500 mg/dL — AB
Ketones, POC UA: NEGATIVE mg/dL
Leukocytes, UA: NEGATIVE
Nitrite, UA: NEGATIVE
Protein Ur, POC: NEGATIVE mg/dL
Spec Grav, UA: 1.01 (ref 1.010–1.025)
Urobilinogen, UA: 0.2 U/dL
pH, UA: 5.5 (ref 5.0–8.0)

## 2024-03-06 NOTE — Discharge Instructions (Signed)
 Your urinalysis today is very reassuring with no evidence of a kidney stone or bladder infection.  I recommend following up as soon as possible with your primary care provider for further evaluation and going to the emergency department for significant worsening symptoms at any time

## 2024-03-06 NOTE — ED Triage Notes (Signed)
 Pt states that he has urinary frequency and abdominal pressure. X2-3 days

## 2024-03-06 NOTE — Telephone Encounter (Signed)
 Patients wife called back and advised they are going to go to the urgent care and then will them and or D.r Shona send over a referral.

## 2024-03-06 NOTE — Telephone Encounter (Signed)
 Patients wife called the front office and requested to schedule an appointment. I contacted the patients wife but was unable to leave a vm due to it being full. I contacted the patients home phone and left a vm.    No referral for patient has been received yet. A referral is needed prior to scheduling an appt.

## 2024-03-07 ENCOUNTER — Emergency Department (HOSPITAL_COMMUNITY)
Admission: EM | Admit: 2024-03-07 | Discharge: 2024-03-07 | Disposition: A | Discharge status: Home / Self Care | Attending: Emergency Medicine | Admitting: Emergency Medicine

## 2024-03-07 ENCOUNTER — Other Ambulatory Visit: Payer: Self-pay

## 2024-03-07 ENCOUNTER — Encounter (HOSPITAL_COMMUNITY): Payer: Self-pay

## 2024-03-07 DIAGNOSIS — R339 Retention of urine, unspecified: Secondary | ICD-10-CM | POA: Insufficient documentation

## 2024-03-07 DIAGNOSIS — I4811 Longstanding persistent atrial fibrillation: Secondary | ICD-10-CM | POA: Diagnosis not present

## 2024-03-07 DIAGNOSIS — Z79899 Other long term (current) drug therapy: Secondary | ICD-10-CM | POA: Insufficient documentation

## 2024-03-07 DIAGNOSIS — I4891 Unspecified atrial fibrillation: Secondary | ICD-10-CM | POA: Diagnosis not present

## 2024-03-07 DIAGNOSIS — I10 Essential (primary) hypertension: Secondary | ICD-10-CM | POA: Diagnosis not present

## 2024-03-07 DIAGNOSIS — J449 Chronic obstructive pulmonary disease, unspecified: Secondary | ICD-10-CM | POA: Insufficient documentation

## 2024-03-07 DIAGNOSIS — E1165 Type 2 diabetes mellitus with hyperglycemia: Secondary | ICD-10-CM | POA: Diagnosis not present

## 2024-03-07 LAB — CBC WITH DIFFERENTIAL/PLATELET
Abs Immature Granulocytes: 0.04 K/uL (ref 0.00–0.07)
Basophils Absolute: 0.1 K/uL (ref 0.0–0.1)
Basophils Relative: 1 %
Eosinophils Absolute: 0 K/uL (ref 0.0–0.5)
Eosinophils Relative: 0 %
HCT: 49 % (ref 39.0–52.0)
Hemoglobin: 15.6 g/dL (ref 13.0–17.0)
Immature Granulocytes: 0 %
Lymphocytes Relative: 24 %
Lymphs Abs: 2.3 K/uL (ref 0.7–4.0)
MCH: 31.7 pg (ref 26.0–34.0)
MCHC: 31.8 g/dL (ref 30.0–36.0)
MCV: 99.6 fL (ref 80.0–100.0)
Monocytes Absolute: 0.7 K/uL (ref 0.1–1.0)
Monocytes Relative: 7 %
Neutro Abs: 6.7 K/uL (ref 1.7–7.7)
Neutrophils Relative %: 68 %
Platelets: 218 K/uL (ref 150–400)
RBC: 4.92 MIL/uL (ref 4.22–5.81)
RDW: 13.9 % (ref 11.5–15.5)
WBC: 9.9 K/uL (ref 4.0–10.5)
nRBC: 0 % (ref 0.0–0.2)

## 2024-03-07 LAB — URINALYSIS, ROUTINE W REFLEX MICROSCOPIC
Bacteria, UA: NONE SEEN
Bilirubin Urine: NEGATIVE
Glucose, UA: >=500 mg/dL — AB
Hgb urine dipstick: NEGATIVE
Ketones, ur: NEGATIVE mg/dL
Leukocytes,Ua: NEGATIVE
Nitrite: NEGATIVE
Protein, ur: NEGATIVE mg/dL
Specific Gravity, Urine: 1.009 (ref 1.005–1.030)
pH: 5 (ref 5.0–8.0)

## 2024-03-07 LAB — COMPREHENSIVE METABOLIC PANEL WITH GFR
ALT: 17 U/L (ref 0–44)
AST: 21 U/L (ref 15–41)
Albumin: 3.6 g/dL (ref 3.5–5.0)
Alkaline Phosphatase: 84 U/L (ref 38–126)
Anion gap: 12 (ref 5–15)
BUN: 22 mg/dL (ref 8–23)
CO2: 25 mmol/L (ref 22–32)
Calcium: 9.6 mg/dL (ref 8.9–10.3)
Chloride: 102 mmol/L (ref 98–111)
Creatinine, Ser: 1.2 mg/dL (ref 0.61–1.24)
GFR, Estimated: >60 mL/min (ref >60)
Glucose, Bld: 104 mg/dL — ABNORMAL HIGH (ref 70–99)
Potassium: 5.2 mmol/L — ABNORMAL HIGH (ref 3.5–5.1)
Sodium: 139 mmol/L (ref 135–145)
Total Bilirubin: 1.2 mg/dL (ref 0.0–1.2)
Total Protein: 7.6 g/dL (ref 6.5–8.1)

## 2024-03-07 LAB — I-STAT CHEM 8, ED
BUN: 22 mg/dL (ref 8–23)
Calcium, Ion: 1.22 mmol/L (ref 1.15–1.40)
Chloride: 101 mmol/L (ref 98–111)
Creatinine, Ser: 1.2 mg/dL (ref 0.61–1.24)
Glucose, Bld: 93 mg/dL (ref 70–99)
HCT: 44 % (ref 39.0–52.0)
Hemoglobin: 15 g/dL (ref 13.0–17.0)
Potassium: 4.8 mmol/L (ref 3.5–5.1)
Sodium: 139 mmol/L (ref 135–145)
TCO2: 24 mmol/L (ref 22–32)

## 2024-03-07 LAB — PROTIME-INR
INR: 0.9 (ref 0.8–1.2)
Prothrombin Time: 12.9 s (ref 11.4–15.2)

## 2024-03-07 MED ORDER — DILTIAZEM HCL 30 MG PO TABS
30.0000 mg | ORAL_TABLET | Freq: Once | ORAL | Status: AC
  Administered 2024-03-07: 30 mg via ORAL
  Filled 2024-03-07: qty 1

## 2024-03-07 MED ORDER — DILTIAZEM HCL 25 MG/5ML IV SOLN
10.0000 mg | Freq: Once | INTRAVENOUS | Status: AC
Start: 2024-03-07 — End: 2024-03-07
  Administered 2024-03-07: 10 mg via INTRAVENOUS
  Filled 2024-03-07: qty 5

## 2024-03-07 NOTE — ED Triage Notes (Signed)
 Patient reports he had shingles and since then he is unable to empty his bladder and is just dribbling.  Reports he has been to several doctors and told him he didn't have a UTI or kidney stone.

## 2024-03-07 NOTE — Discharge Instructions (Addendum)
 Plan follow-up with Dr. Sherrilee as we discussed.  In the interim return here if you have any problems or concerns regarding your urinary retention.

## 2024-03-07 NOTE — ED Provider Notes (Signed)
 [place] EMERGENCY DEPARTMENT AT [facility] Provider Note   CSN: [id number] Arrival date & time: 03/07/24  1142     Patient presents with: Urinary Retention   [name] is a 78 y.o. male with a history including hypertension, COPD, atrial fibrillation who is not currently anticoagulated secondary to history of subdural hematoma with a fall, currently awaiting consideration for Watchman procedure presenting secondary to decreased urination.  He has been seen by his primary provider who checked a UA and it was negative.  He was also told he does not have kidney stones.  He has increasing suprapubic pressure and states he only empties a small amount of urine at a time, described as dribbling.  He denies dysuria and hematuria.  He denies flank pain, fevers or chills.  He denies history of kidney stones, unsure if he has been diagnosed with any prostate issues.  His symptoms have been present for about 3 to 4 days.   The history is provided by the patient and the spouse.       Prior to Admission medications   Medication Sig Start Date End Date Taking? Authorizing Provider  acetaminophen  (TYLENOL ) 500 MG tablet Take 1 tablet (500 mg total) by mouth every 6 (six) hours for 8 days then as directed by MD 04/04/23   Orlando Pond, DO  ALPRAZolam  (XANAX ) 0.25 MG tablet TAKE 1 TABLET(0.25 MG) BY MOUTH DAILY 11/12/20   Waddell Catholic M, DO  ascorbic acid  (VITAMIN C) 500 MG tablet Take 500 mg by mouth at bedtime.    [provider]  atorvastatin  (LIPITOR) 40 MG tablet TAKE 1 TABLET(40 MG) BY MOUTH DAILY 06/03/20   Waddell, Malena M, DO  diltiazem  (CARDIZEM ) 30 MG tablet Take 1 tablet every 4 hours AS NEEDED for heart rate >100 as long as blood pressure >100. 06/28/23   Fenton, Clint R, PA  empagliflozin  (JARDIANCE ) 10 MG TABS tablet Take 1 tablet (10 mg total) by mouth daily. 07/02/23   Jeffrie Oneil BROCKS, MD  furosemide  (LASIX ) 20 MG tablet Take 1 tablet (20 mg total) by mouth daily.  07/02/23   Jeffrie Oneil BROCKS, MD  loperamide (IMODIUM A-D) 2 MG tablet Take 2 mg by mouth as needed for diarrhea or loose stools.    [provider]  losartan  (COZAAR ) 25 MG tablet Take 1 tablet (25 mg total) by mouth daily. 11/19/23   Lelon Hamilton T, PA-C  metoprolol  succinate (TOPROL  XL) 25 MG 24 hr tablet Take 1 tablet (25 mg total) by mouth in the morning and at bedtime. 12/24/23   Cindie Ole DASEN, MD  Multiple Vitamins-Minerals (CENTRUM SILVER PO) Take 1 tablet by mouth at bedtime.    [provider]  nitroGLYCERIN  (NITROSTAT ) 0.4 MG SL tablet Place 1 tablet (0.4 mg total) under the tongue every 5 (five) minutes as needed for chest pain. 02/09/23   Jeffrie Oneil BROCKS, MD  Omega-3 Fatty Acids (FISH OIL) 1000 MG CAPS Take 1,200 mg by mouth at bedtime.    [provider]  potassium chloride  SA (KLOR-CON  M) 10 MEQ tablet Take 2 tablets (20 mEq total) by mouth daily. 07/04/23   Jeffrie Oneil BROCKS, MD    Allergies: Vibramycin [doxycycline], Asa [aspirin], and Nsaids    Review of Systems  Constitutional:  Negative for fever.  HENT:  Negative for congestion and sore throat.   Eyes: Negative.   Respiratory:  Negative for chest tightness and shortness of breath.   Cardiovascular:  Negative for chest pain.  Gastrointestinal:  Negative for abdominal distention, abdominal pain, nausea and vomiting.  Genitourinary:  Positive for decreased urine volume and difficulty urinating. Negative for dysuria, flank pain, hematuria and urgency.  Musculoskeletal:  Negative for arthralgias, joint swelling and neck pain.  Skin: Negative.  Negative for rash and wound.  Neurological:  Negative for dizziness, weakness, light-headedness, numbness and headaches.  Psychiatric/Behavioral: Negative.      Updated Vital Signs BP 100/73   Pulse 66   Temp (!) 97.5 F (36.4 C)   Resp 14   Ht 5' 5 (1.651 m)   Wt 61.2 kg   SpO2 95%   BMI 22.47 kg/m   Physical Exam Vitals and nursing note reviewed.   Constitutional:      Appearance: He is well-developed.  HENT:     Head: Normocephalic and atraumatic.  Eyes:     Conjunctiva/sclera: Conjunctivae normal.  Cardiovascular:     Rate and Rhythm: Normal rate and regular rhythm.     Heart sounds: Normal heart sounds.  Pulmonary:     Effort: Pulmonary effort is normal.     Breath sounds: Normal breath sounds. No wheezing.  Abdominal:     General: Bowel sounds are normal. There is distension.     Palpations: Abdomen is soft.     Tenderness: There is no abdominal tenderness. There is no guarding.     Comments: Mild localized distention in the suprapubic region, no pain on palpation.  Musculoskeletal:        General: Normal range of motion.     Cervical back: Normal range of motion.  Skin:    General: Skin is warm and dry.  Neurological:     Mental Status: He is alert.     (all labs ordered are listed, but only abnormal results are displayed) Labs Reviewed  URINALYSIS, ROUTINE W REFLEX MICROSCOPIC - Abnormal; Notable for the following components:      Result Value   Glucose, UA >=500 (*)    All other components within normal limits  COMPREHENSIVE METABOLIC PANEL WITH GFR - Abnormal; Notable for the following components:   Potassium 5.2 (*)    Glucose, Bld 104 (*)    All other components within normal limits  CBC WITH DIFFERENTIAL/PLATELET  PROTIME-INR  I-STAT CHEM 8, ED    EKG: EKG Interpretation Date/Time:  Friday March 07 2024 12:09:32 EDT Ventricular Rate:  130 PR Interval:    QRS Duration:  74 QT Interval:  312 QTC Calculation: 459 R Axis:   6  Text Interpretation: Atrial fibrillation with rapid ventricular response Possible Anterior infarct (cited on or before 07-Mar-2024) Abnormal ECG When compared with ECG of 28-Jun-2023 09:35, increased rate from prior Confirmed by Towana Sharper [phone]) on 03/07/2024 12:14:09 PM  Radiology: No results found.   Procedures   Medications Ordered in the ED  diltiazem   (CARDIZEM ) injection 10 mg (10 mg Intravenous Given 03/07/24 1508)                                    Medical Decision Making Patient presenting with decreased urination, stating he is passing small amounts of urine but never feeling like he completely empties his bladder.  No fevers, no dysuria or hematuria.  No flank pain.  He presents with A-fib, of which she has his history.  Had a subdural hematoma secondary to a fall therefore taken off of his Tikosyn  and his anticoagulant, anticipating Watchman placement.  While here he did develop RVR with a rate up to 144.  He states he has been compliant with his metoprolol  twice daily, he also has a diltiazem  prescription which he takes as needed nightly after checking his heart rate with instructions that if it is over 100 he is to take a diltiazem  tablet.  He has no symptoms suggesting A-fib with RVR at this time.  He was given IV diltiazem  after which his rate returned to normal range.  No further action regarding A-fib at this time.  After a bladder scan with greater than 500 cc of urine, we placed a Foley catheter after which patient got significant relief in his symptoms.  Urinalysis is negative for infection.  Also no hemoglobin in his urine.  He does have greater than 500 glucose, patient is a diabetic.  His only other abnormality is he has a borderline elevated potassium level at 5.2. will repeat, suspect erroneous.  Glucose is 104.  Of note he is a diabetic.  No DKA today.  He is given instructions for home Foley catheter care and was given a leg bag.  He has been referred to Dr. Sherrilee for follow-up outpatient care.  Amount and/or Complexity of Data Reviewed Labs: ordered.    Details: Per above. ECG/medicine tests: ordered.    Details: A-fib with RVR, rate 130.  His rate improved after giving 1 dose of diltiazem  10 mg IV.  Risk Decision regarding hospitalization. Risk Details: No indication for hospitalization.  Patient was sent home with a  Foley catheter along with a leg bag, instructions for home care, plan close follow-up with Dr. Sherrilee, referral given.        Final diagnoses:  Urinary retention  Longstanding persistent atrial fibrillation [facility])    ED Discharge Orders     None          Arnell Mausolf, PA-C 03/07/24 1656    Towana Ozell BROCKS, MD 03/07/24 1815

## 2024-03-09 NOTE — ED Provider Notes (Signed)
 [facility]    CSN: [id number] Arrival date & time: 03/06/24  1143      History   Chief Complaint Chief Complaint  Patient presents with   Urinary Frequency    HPI [name] is a 78 y.o. male.   Patient presenting today with 2 to 3-day history of urinary frequency, urgency, suprapubic pressure and only urinating small amounts at a time.  Denies obvious fever, flank pain, hematuria, nausea, vomiting, bowel changes, dizziness, lightheadedness.  Denies past history of known GU issues.  Past medical history significant for AAA, atrial fibrillation on anticoagulation and rate control, CAD, COPD, hypertension and recent subdural hematoma.  So far not trying anything over-the-counter for symptoms.    Past Medical History:  Diagnosis Date   AAA (abdominal aortic aneurysm) (HCC)    06/27/20 MRI: 3.0 cm infrarenal AAA, recommend 3 year follow-up   Anxiety    Atrial fibrillation (HCC)    Bilateral carotid artery disease (HCC) 07/21/2013   CAD (coronary artery disease) 2008   CABG   COPD (chronic obstructive pulmonary disease) (HCC)    Dysrhythmia    A-fib   Gastritis    Headache(784.0)    History of hiatal hernia    Hypertension    SDH (subdural hematoma) (HCC) 01/31/2023   Visit for monitoring Tikosyn  therapy 12/2015    Patient Active Problem List   Diagnosis Date Noted   Fluid overload 05/12/2023   CHF (congestive heart failure) (HCC) 05/12/2023   Heart failure with improved ejection fraction (HFimpEF) (HCC) 04/24/2023   Lower extremity edema 04/24/2023   Cerebral aneurysm 04/17/2023   COVID-19 04/03/2023   Right sided weakness 04/03/2023   Abnormal gait 03/27/2023   Subdural hematoma (HCC) 03/26/2023   Head injury 02/13/2023   Fall 02/13/2023   Subdural hematoma due to concussion (HCC) 02/13/2023   Paroxysmal atrial fibrillation (HCC) 09/15/2021   Hypercoagulable state due to paroxysmal atrial fibrillation (HCC) 09/15/2021   AAA (abdominal aortic  aneurysm) without rupture (HCC) 09/15/2021   History of bradycardia 10/21/2020   Spinal stenosis at L4-L5 level 08/09/2020   Insomnia 02/06/2018   Visit for monitoring Tikosyn  therapy 01/10/2016   Atrial flutter (HCC) 08/07/2015   Bilateral carotid artery disease (HCC) 07/21/2013   CAD (coronary artery disease) 01/26/2013   Chronic obstructive pulmonary disease (HCC) 01/26/2013   Essential hypertension, benign 01/26/2013   Hyperlipidemia 01/26/2013    Past Surgical History:  Procedure Laterality Date   BURR HOLE Left 03/27/2023   Procedure: BURR HOLE EVASCUATION OF SUBDURAL HEMATOMA;  Surgeon: Cheryle Debby LABOR, MD;  Location: [facility] OR;  Service: Neurosurgery;  Laterality: Left;   CARDIOVERSION N/A 09/08/2015   Procedure: CARDIOVERSION;  Surgeon: Aleene JINNY Passe, MD;  Location: [facility] ENDOSCOPY;  Service: Cardiovascular;  Laterality: N/A;   CORONARY ARTERY BYPASS GRAFT     EXCISION OF KELOID Left 10/08/2018   Procedure: EXCISION OF CHRONIC ABDOMINAL WALL WOUND;  Surgeon: Vernetta Berg, MD;  Location: [facility];  Service: General;  Laterality: Left;   INCISION AND DRAINAGE DEEP NECK ABSCESS     IR ANGIO EXTERNAL CAROTID SEL EXT CAROTID UNI L MOD SED  04/17/2023   IR ANGIO INTRA EXTRACRAN SEL COM CAROTID INNOMINATE UNI L MOD SED  04/17/2023   IR NEURO EACH ADD'L AFTER BASIC UNI LEFT (MS)  04/17/2023   LUMBAR LAMINECTOMY/DECOMPRESSION MICRODISCECTOMY Right 08/09/2020   Procedure: Laminectomy and Foraminotomy - Lumbar three-Lumbar four - Lumbar four-Lumbar five- right;  Surgeon: Onetha Kuba, MD;  Location: [facility]  OR;  Service: Neurosurgery;  Laterality: Right;   RADIOLOGY WITH ANESTHESIA N/A 04/17/2023   Procedure: Left MMA;  Surgeon: Lanis Pupa, MD;  Location: [facility] OR;  Service: Radiology;  Laterality: N/A;   ROTATOR CUFF REPAIR Bilateral    TEE WITHOUT CARDIOVERSION N/A 08/02/2015   Procedure: TRANSESOPHAGEAL ECHOCARDIOGRAM (TEE);  Surgeon: Wilbert JONELLE Bihari, MD;  Location: [facility]  ENDOSCOPY;  Service: Cardiovascular;  Laterality: N/A;   TEE WITHOUT CARDIOVERSION N/A 09/08/2015   Procedure: TRANSESOPHAGEAL ECHOCARDIOGRAM (TEE);  Surgeon: Aleene JINNY Passe, MD;  Location: [facility] ENDOSCOPY;  Service: Cardiovascular;  Laterality: N/A;       Home Medications    Prior to Admission medications   Medication Sig Start Date End Date Taking? Authorizing Provider  acetaminophen  (TYLENOL ) 500 MG tablet Take 1 tablet (500 mg total) by mouth every 6 (six) hours for 8 days then as directed by MD 04/04/23  Yes Orlando, Kieth, DO  ALPRAZolam  (XANAX ) 0.25 MG tablet TAKE 1 TABLET(0.25 MG) BY MOUTH DAILY 11/12/20  Yes Waddell, Malena M, DO  ascorbic acid  (VITAMIN C) 500 MG tablet Take 500 mg by mouth at bedtime.   Yes [provider]  atorvastatin  (LIPITOR) 40 MG tablet TAKE 1 TABLET(40 MG) BY MOUTH DAILY 06/03/20  Yes Waddell, Malena M, DO  diltiazem  (CARDIZEM ) 30 MG tablet Take 1 tablet every 4 hours AS NEEDED for heart rate >100 as long as blood pressure >100. 06/28/23  Yes Fenton, Clint R, PA  empagliflozin  (JARDIANCE ) 10 MG TABS tablet Take 1 tablet (10 mg total) by mouth daily. 07/02/23  Yes Jeffrie Oneil BROCKS, MD  furosemide  (LASIX ) 20 MG tablet Take 1 tablet (20 mg total) by mouth daily. 07/02/23  Yes Jeffrie Oneil BROCKS, MD  losartan  (COZAAR ) 25 MG tablet Take 1 tablet (25 mg total) by mouth daily. 11/19/23  Yes Weaver, Scott T, PA-C  metoprolol  succinate (TOPROL  XL) 25 MG 24 hr tablet Take 1 tablet (25 mg total) by mouth in the morning and at bedtime. 12/24/23  Yes Cindie Ole DASEN, MD  Multiple Vitamins-Minerals (CENTRUM SILVER PO) Take 1 tablet by mouth at bedtime.   Yes [provider]  nitroGLYCERIN  (NITROSTAT ) 0.4 MG SL tablet Place 1 tablet (0.4 mg total) under the tongue every 5 (five) minutes as needed for chest pain. 02/09/23  Yes Jeffrie Oneil BROCKS, MD  Omega-3 Fatty Acids (FISH OIL) 1000 MG CAPS Take 1,200 mg by mouth at bedtime.   Yes [provider]  potassium chloride   SA (KLOR-CON  M) 10 MEQ tablet Take 2 tablets (20 mEq total) by mouth daily. 07/04/23  Yes Jeffrie Oneil BROCKS, MD  loperamide (IMODIUM A-D) 2 MG tablet Take 2 mg by mouth as needed for diarrhea or loose stools.    [provider]    Family History Family History  Problem Relation Age of Onset   Diabetes Mother    Hypertension Mother    Heart attack Father    Sudden death Father    Hypertension Sister    Diabetes Sister     Social History Social History   Tobacco Use   Smoking status: Former    Current packs/day: 0.00    Types: Cigarettes    Quit date: 1960    Years since quitting: 65.5   Smokeless tobacco: Former    Types: Chew    Quit date: 08/2022   Tobacco comments:    Former smoker 04/20/23  Vaping Use   Vaping status: Never Used  Substance Use Topics   Alcohol use:  No    Comment: quit drinking in 1971   Drug use: No     Allergies   Vibramycin [doxycycline], Asa [aspirin], and Nsaids   Review of Systems Review of Systems Per HPI  Physical Exam Triage Vital Signs ED Triage Vitals  Encounter Vitals Group     BP 03/06/24 1201 101/66     Girls Systolic BP Percentile --      Girls Diastolic BP Percentile --      Boys Systolic BP Percentile --      Boys Diastolic BP Percentile --      Pulse Rate 03/06/24 1201 62     Resp 03/06/24 1201 16     Temp --      Temp Source 03/06/24 1201 Oral     SpO2 03/06/24 1201 96 %     Weight 03/06/24 1157 135 lb (61.2 kg)     Height 03/06/24 1157 5' 5 (1.651 m)     Head Circumference --      Peak Flow --      Pain Score 03/06/24 1157 10     Pain Loc --      Pain Education --      Exclude from Growth Chart --    No data found.  Updated Vital Signs BP 101/66 (BP Location: Right Arm)   Pulse 62   Resp 16   Ht 5' 5 (1.651 m)   Wt 135 lb (61.2 kg)   SpO2 96%   BMI 22.47 kg/m   Visual Acuity Right Eye Distance:   Left Eye Distance:   Bilateral Distance:    Right Eye Near:   Left Eye Near:    Bilateral  Near:     Physical Exam Vitals and nursing note reviewed.  Constitutional:      Appearance: Normal appearance.  HENT:     Head: Atraumatic.     Mouth/Throat:     Mouth: Mucous membranes are moist.  Eyes:     Extraocular Movements: Extraocular movements intact.     Conjunctiva/sclera: Conjunctivae normal.  Cardiovascular:     Rate and Rhythm: Normal rate.  Pulmonary:     Effort: Pulmonary effort is normal.  Abdominal:     General: Bowel sounds are normal. There is no distension.     Palpations: Abdomen is soft.     Tenderness: There is no abdominal tenderness. There is no right CVA tenderness, left CVA tenderness or guarding.  Genitourinary:    Comments: GU exam deferred Musculoskeletal:        General: Normal range of motion.     Cervical back: Normal range of motion and neck supple.  Skin:    General: Skin is warm and dry.  Neurological:     Mental Status: He is oriented to person, place, and time.  Psychiatric:        Mood and Affect: Mood normal.        Thought Content: Thought content normal.        Judgment: Judgment normal.      UC Treatments / Results  Labs (all labs ordered are listed, but only abnormal results are displayed) Labs Reviewed  POCT URINALYSIS DIP (MANUAL ENTRY) - Abnormal; Notable for the following components:      Result Value   Glucose, UA =500 (*)    All other components within normal limits    EKG   Radiology No results found.  Procedures Procedures (including critical care time)  Medications Ordered in UC Medications -  No data to display  Initial Impression / Assessment and Plan / UC Course  I have reviewed the triage vital signs and the nursing notes.  Pertinent labs & imaging results that were available during my care of the patient were reviewed by me and considered in my medical decision making (see chart for details).     Urinalysis today within normal limits aside from 500 glucose which is likely secondary to  Jardiance  for diabetes.  No evidence of urinary tract infection, kidney stones or other GU acute abnormality.  Discussed with patient that the urinary retention he is describing is concerning and if this persists or continues to worsen he needs to go to the emergency department for imaging.  Recommended close follow-up with PCP as soon as possible and potentially referral to urology.  Push fluids, stay well-hydrated and continue chronic medications.  Final Clinical Impressions(s) / UC Diagnoses   Final diagnoses:  Urinary frequency  Suprapubic pressure     Discharge Instructions      Your urinalysis today is very reassuring with no evidence of a kidney stone or bladder infection.  I recommend following up as soon as possible with your primary care provider for further evaluation and going to the emergency department for significant worsening symptoms at any time    ED Prescriptions   None    PDMP not reviewed this encounter.   Stuart Millman [place], [place] 03/09/24 [phone]

## 2024-03-14 DIAGNOSIS — R339 Retention of urine, unspecified: Secondary | ICD-10-CM | POA: Diagnosis not present

## 2024-03-14 DIAGNOSIS — Z7689 Persons encountering health services in other specified circumstances: Secondary | ICD-10-CM | POA: Diagnosis not present

## 2024-03-18 ENCOUNTER — Other Ambulatory Visit (HOSPITAL_COMMUNITY): Payer: Self-pay | Admitting: Physician Assistant

## 2024-03-24 ENCOUNTER — Telehealth: Payer: Self-pay

## 2024-03-24 NOTE — Telephone Encounter (Signed)
 SABRA

## 2024-03-25 DIAGNOSIS — Z720 Tobacco use: Secondary | ICD-10-CM | POA: Insufficient documentation

## 2024-03-25 DIAGNOSIS — Z951 Presence of aortocoronary bypass graft: Secondary | ICD-10-CM | POA: Insufficient documentation

## 2024-03-25 DIAGNOSIS — Z7901 Long term (current) use of anticoagulants: Secondary | ICD-10-CM | POA: Insufficient documentation

## 2024-03-25 DIAGNOSIS — I6529 Occlusion and stenosis of unspecified carotid artery: Secondary | ICD-10-CM | POA: Insufficient documentation

## 2024-03-25 DIAGNOSIS — E7800 Pure hypercholesterolemia, unspecified: Secondary | ICD-10-CM | POA: Insufficient documentation

## 2024-03-25 DIAGNOSIS — K5730 Diverticulosis of large intestine without perforation or abscess without bleeding: Secondary | ICD-10-CM | POA: Insufficient documentation

## 2024-03-25 NOTE — Progress Notes (Unsigned)
 Name: [name] DOB: 07-07-1946 MRN: [id number]  History of Present Illness: [name] is a 78 y.o. male who presents today as a new patient at [facility] [place]. All available relevant medical records have been reviewed.  He reports chief complaint of urinary retention.  Recent history:  > 03/07/2024: Seen in ER for urinary retention.  - He has increasing suprapubic pressure and states he only empties a small amount of urine at a time, described as dribbling. - PVR >500 ml.  - UA showed no evidence of infection.  - Foley catheter placed.  Today: He {Actions; denies-reports:120008} history of urinary retention prior to current episode.  He reports the catheter is draining***.  He {Actions; denies-reports:120008} gross hematuria.  He {Actions; denies-reports:120008} flank pain or abdominal pain. He {Actions; denies-reports:120008} fevers, nausea, or vomiting.  He {Actions; denies-reports:120008} constipation. He *** taking laxatives, stool softeners, or fiber supplements.  Medications: Current Outpatient Medications  Medication Sig Dispense Refill   acetaminophen  (TYLENOL ) 500 MG tablet Take 1 tablet (500 mg total) by mouth every 6 (six) hours for 8 days then as directed by MD 100 tablet 0   ALPRAZolam  (XANAX ) 0.25 MG tablet TAKE 1 TABLET(0.25 MG) BY MOUTH DAILY 10 tablet 0   ascorbic acid  (VITAMIN C) 500 MG tablet Take 500 mg by mouth at bedtime.     atorvastatin  (LIPITOR) 40 MG tablet TAKE 1 TABLET(40 MG) BY MOUTH DAILY 90 tablet 1   diltiazem  (CARDIZEM ) 30 MG tablet TAKE 1 TABLET BY MOUTH EVERY 4 HOURS AS NEEDED FOR HEART RATE>100 AS LONG AS BLOOD PRESSURE>100 45 tablet 1   empagliflozin  (JARDIANCE ) 10 MG TABS tablet Take 1 tablet (10 mg total) by mouth daily. 90 tablet 3   furosemide  (LASIX ) 20 MG tablet Take 1 tablet (20 mg total) by mouth daily. 90 tablet 3   loperamide (IMODIUM A-D) 2 MG tablet Take 2 mg by mouth as needed for diarrhea or loose stools.      losartan  (COZAAR ) 25 MG tablet Take 1 tablet (25 mg total) by mouth daily. 90 tablet 3   metoprolol  succinate (TOPROL  XL) 25 MG 24 hr tablet Take 1 tablet (25 mg total) by mouth in the morning and at bedtime. 180 tablet 3   Multiple Vitamins-Minerals (CENTRUM SILVER PO) Take 1 tablet by mouth at bedtime.     nitroGLYCERIN  (NITROSTAT ) 0.4 MG SL tablet Place 1 tablet (0.4 mg total) under the tongue every 5 (five) minutes as needed for chest pain. 10 tablet 3   Omega-3 Fatty Acids (FISH OIL) 1000 MG CAPS Take 1,200 mg by mouth at bedtime.     potassium chloride  SA (KLOR-CON  M) 10 MEQ tablet Take 2 tablets (20 mEq total) by mouth daily. 180 tablet 3   No current facility-administered medications for this visit.    Allergies: Allergies  Allergen Reactions   Vibramycin [Doxycycline] Hives and Shortness Of Breath    Increased urinary frequency   Asa [Aspirin] Hypertension    Told to avoid due to heart condition   Nsaids Hypertension    Told to avoid due to heart condition    Past Medical History:  Diagnosis Date   AAA (abdominal aortic aneurysm) (HCC)    06/27/20 MRI: 3.0 cm infrarenal AAA, recommend 3 year follow-up   Anxiety    Atrial fibrillation (HCC)    Bilateral carotid artery disease (HCC) 07/21/2013   CAD (coronary artery disease) 2008   CABG   COPD (chronic obstructive pulmonary disease) (HCC)  Dysrhythmia    A-fib   Gastritis    Headache(784.0)    History of hiatal hernia    Hypertension    SDH (subdural hematoma) (HCC) 01/31/2023   Visit for monitoring Tikosyn  therapy 12/2015   Past Surgical History:  Procedure Laterality Date   BURR HOLE Left 03/27/2023   Procedure: BURR HOLE EVASCUATION OF SUBDURAL HEMATOMA;  Surgeon: Cheryle Debby LABOR, MD;  Location: [facility] OR;  Service: Neurosurgery;  Laterality: Left;   CARDIOVERSION N/A 09/08/2015   Procedure: CARDIOVERSION;  Surgeon: Aleene JINNY Passe, MD;  Location: [facility] ENDOSCOPY;  Service: Cardiovascular;  Laterality: N/A;    CORONARY ARTERY BYPASS GRAFT     EXCISION OF KELOID Left 10/08/2018   Procedure: EXCISION OF CHRONIC ABDOMINAL WALL WOUND;  Surgeon: Vernetta Berg, MD;  Location: [facility];  Service: General;  Laterality: Left;   INCISION AND DRAINAGE DEEP NECK ABSCESS     IR ANGIO EXTERNAL CAROTID SEL EXT CAROTID UNI L MOD SED  04/17/2023   IR ANGIO INTRA EXTRACRAN SEL COM CAROTID INNOMINATE UNI L MOD SED  04/17/2023   IR NEURO EACH ADD'L AFTER BASIC UNI LEFT (MS)  04/17/2023   LUMBAR LAMINECTOMY/DECOMPRESSION MICRODISCECTOMY Right 08/09/2020   Procedure: Laminectomy and Foraminotomy - Lumbar three-Lumbar four - Lumbar four-Lumbar five- right;  Surgeon: Onetha Kuba, MD;  Location: [facility] OR;  Service: Neurosurgery;  Laterality: Right;   RADIOLOGY WITH ANESTHESIA N/A 04/17/2023   Procedure: Left MMA;  Surgeon: Lanis Pupa, MD;  Location: [facility] OR;  Service: Radiology;  Laterality: N/A;   ROTATOR CUFF REPAIR Bilateral    TEE WITHOUT CARDIOVERSION N/A 08/02/2015   Procedure: TRANSESOPHAGEAL ECHOCARDIOGRAM (TEE);  Surgeon: Wilbert JONELLE Bihari, MD;  Location: [facility] ENDOSCOPY;  Service: Cardiovascular;  Laterality: N/A;   TEE WITHOUT CARDIOVERSION N/A 09/08/2015   Procedure: TRANSESOPHAGEAL ECHOCARDIOGRAM (TEE);  Surgeon: Aleene JINNY Passe, MD;  Location: [facility] ENDOSCOPY;  Service: Cardiovascular;  Laterality: N/A;   Family History  Problem Relation Age of Onset   Diabetes Mother    Hypertension Mother    Heart attack Father    Sudden death Father    Hypertension Sister    Diabetes Sister    Social History   Socioeconomic History   Marital status: Married    Spouse name: Not on file   Number of children: 2   Years of education: Not on file   Highest education level: Not on file  Occupational History   Not on file  Tobacco Use   Smoking status: Former    Current packs/day: 0.00    Types: Cigarettes    Quit date: 1960    Years since quitting: 65.6   Smokeless tobacco: Former    Types: Chew     Quit date: 08/2022   Tobacco comments:    Former smoker 04/20/23  Vaping Use   Vaping status: Never Used  Substance and Sexual Activity   Alcohol use: No    Comment: quit drinking in 1971   Drug use: No   Sexual activity: Not on file  Other Topics Concern   Not on file  Social History Narrative   Right Handed   Decaf Coffee per Day    Social Drivers of Health   Financial Resource Strain: Low Risk  (05/28/2023)   Overall Financial Resource Strain (CARDIA)    Difficulty of Paying Living Expenses: Not very hard  Food Insecurity: No Food Insecurity (05/28/2023)   Hunger Vital Sign    Worried About Running Out of Food in the  Last Year: Never true    Ran Out of Food in the Last Year: Never true  Transportation Needs: No Transportation Needs (05/28/2023)   PRAPARE - Administrator, Civil Service (Medical): No    Lack of Transportation (Non-Medical): No  Physical Activity: Not on file  Stress: No Stress Concern Present (05/28/2023)   [employer] of Occupational Health - Occupational Stress Questionnaire    Feeling of Stress : Only a little  Social Connections: Unknown (01/31/2023)   [facility] from [facility]   Social Network    Social Network: Not on file  Intimate Partner Violence: Not At Risk (05/12/2023)   Humiliation, Afraid, Rape, and Kick questionnaire    Fear of Current or Ex-Partner: No    Emotionally Abused: No    Physically Abused: No    Sexually Abused: No    SUBJECTIVE  Review of Systems Constitutional: Patient denies any unintentional weight loss or change in strength lntegumentary: Patient denies any rashes or pruritus Cardiovascular: Patient denies chest pain or syncope Respiratory: Patient denies shortness of breath Gastrointestinal: ***As per HPI Musculoskeletal: Patient denies muscle cramps or weakness Neurologic: Patient denies convulsions or seizures Allergic/Immunologic: Patient denies recent allergic reaction(s) Hematologic/Lymphatic:  Patient denies bleeding tendencies Endocrine: Patient denies heat/cold intolerance  GU: As per HPI.  OBJECTIVE There were no vitals filed for this visit. There is no height or weight on file to calculate BMI.  Physical Examination Constitutional: No obvious distress; patient is non-toxic appearing  Cardiovascular: No visible lower extremity edema.  Respiratory: The patient does not have audible wheezing/stridor; respirations do not appear labored  Gastrointestinal: Abdomen non-distended Musculoskeletal: Normal ROM of UEs  Skin: No obvious rashes/open sores  Neurologic: CN 2-12 grossly intact Psychiatric: Answered questions appropriately with normal affect  Hematologic/Lymphatic/Immunologic: No obvious bruises or sites of spontaneous bleeding  ***GU: Catheter draining ***clear yellow urine.   ASSESSMENT No diagnosis found.  We discussed possible etiologies for urinary retention such as: temporary detrusor areflexia s/p surgery or acute injury, over-distention bladder injury, neurogenic bladder, BPH/BOO, clot retention, constipation, anticholinergic medication use, high-tone pelvic floor dysfunction, voiding dyssynergia. He has risk factors for neurogenic bladder including: T2DM, nicotine use, spinal stenosis. Prostate was unremarkable on CT in September 2024.  We discussed risks related to urinary retention including but not limited to: bladder discomfort I pain, overflow urinary incontinence (which can result in skin breakdown / wounds), UTls, pyelonephritis, urosepsis, kidney damage/failure, decreased kidney function requiring dialysis, early mortality.  ***Passed voiding trial; Foley catheter discontinued. Advised to return to clinic if unable to void or go to ER if necessary.   ***Failed voiding trial.  ***Patient is not a viable candidate for doing clean intermittent catheterization (CIC).  ***Discussed option to replace indwelling Foley catheter or proceed with clean  intermittent catheterization (CIC); patient elected ***indwelling Foley catheter ***CIC.  ***Indwelling Foley catheter replaced. ***CIC teaching was performed by nursing staff and a sample bag of catheter supplies was provided. Patient instructed to perform CIC ***x/day after voiding volitionally and to keep a record of their PVR volumes for review at next appointment. *** Advised patient to CIC as needed and to call our office if they need additional catheter supplies to be ordered.   Patient advised that ***indwelling Foley catheter ***SP tube should be exchanged every 4 weeks by a nurse (either in office or via home health) to prevent catheter from becoming clogged with sediment.   ***Patient is strongly opposed to any form of catheterization. Advised further evaluation with  RUS to screen for hydronephrosis and BMP to assess renal function.   We discussed recommendation for further evaluation via: ***cystoscopy to assess for ***outlet obstruction, bladder abnormalities ***urodynamic testing to assess ***bladder function  Patient elected to proceed with  ***chronic indwelling Foley catheter  ***CIC ***x/day  ***SP tube placement. Order submitted for Interventional Radiology.  We agreed to plan for follow up in *** months or sooner if needed.  Patient verbalized understanding of and agreement with current plan. All questions were answered.  PLAN Advised the following: Foley catheter ***discontinued. ***No follow-ups on file.  No orders of the defined types were placed in this encounter.   It has been explained that the patient is to follow regularly with their PCP in addition to all other providers involved in their care and to follow instructions provided by these respective offices. Patient advised to contact urology clinic if any urologic-pertaining questions, concerns, new symptoms or problems arise in the interim period.  There are no Patient Instructions on file for this  visit.  Electronically signed by:  Lauraine KYM Oz, MSN, FNP-C, CUNP 03/25/2024 12:30 PM

## 2024-03-26 ENCOUNTER — Encounter: Payer: Self-pay | Admitting: Urology

## 2024-03-26 ENCOUNTER — Ambulatory Visit (INDEPENDENT_AMBULATORY_CARE_PROVIDER_SITE_OTHER): Admitting: Urology

## 2024-03-26 VITALS — BP 144/78 | HR 65

## 2024-03-26 DIAGNOSIS — Z720 Tobacco use: Secondary | ICD-10-CM

## 2024-03-26 DIAGNOSIS — E119 Type 2 diabetes mellitus without complications: Secondary | ICD-10-CM | POA: Insufficient documentation

## 2024-03-26 DIAGNOSIS — M48061 Spinal stenosis, lumbar region without neurogenic claudication: Secondary | ICD-10-CM | POA: Diagnosis not present

## 2024-03-26 DIAGNOSIS — E1165 Type 2 diabetes mellitus with hyperglycemia: Secondary | ICD-10-CM | POA: Diagnosis not present

## 2024-03-26 DIAGNOSIS — R339 Retention of urine, unspecified: Secondary | ICD-10-CM

## 2024-03-26 MED ORDER — CIPROFLOXACIN HCL 500 MG PO TABS: 500.0000 mg | ORAL_TABLET | Freq: Once | ORAL | Status: DC

## 2024-03-26 MED ORDER — TAMSULOSIN HCL 0.4 MG PO CAPS: 0.4000 mg | ORAL_CAPSULE | Freq: Every day | ORAL | 11 refills | Status: DC

## 2024-03-26 NOTE — Addendum Note (Signed)
 Addended by: GRETTA MASTERS R on: 03/26/2024 11:51 AM   Modules accepted: Orders

## 2024-03-26 NOTE — Progress Notes (Signed)
 Fill and Pull Catheter Removal  Patient is present today for a catheter removal.  300 ml of sterile water was instilled into the bladder when the patient felt the urge to urinate. 10 ml of water was then drained from the balloon.  A 16 FR foley cath was removed from the bladder no complications were noted .  Foley catheter intact and time of removal. Patient as then given some time to void on their own.  Patient cannot void  0ml on their own after some time.  Patient tolerated well.  One oral prophylactic antibiotic given per MD orders  Performed by: Carlos, CMA  Follow up/ Additional notes: N/A  Simple Catheter Placement  Due to urinary retention patient is present today for a foley cath placement.  Patient was cleaned and prepped in a sterile fashion with Betadinex3 . A 16 coude FR foley catheter was inserted, urine return was noted  375 ml, urine was Clear yellow in color.  The balloon was filled with 10cc of sterile water.  A leg bag was attached for drainage. Patient was also given a night bag to take home and was given instruction on how to change from one bag to another.  Patient was given instruction on proper catheter care.  Patient tolerated well, no complications were noted   Performed by: Carlos, CMA  Additional notes/ Follow up:

## 2024-04-03 ENCOUNTER — Ambulatory Visit

## 2024-04-03 DIAGNOSIS — R339 Retention of urine, unspecified: Secondary | ICD-10-CM | POA: Diagnosis not present

## 2024-04-03 MED ORDER — CIPROFLOXACIN HCL 500 MG PO TABS
500.0000 mg | ORAL_TABLET | Freq: Once | ORAL | Status: AC
  Administered 2024-04-03: 500 mg via ORAL

## 2024-04-03 MED ORDER — TAMSULOSIN HCL 0.4 MG PO CAPS: 0.4000 mg | ORAL_CAPSULE | Freq: Two times a day (BID) | ORAL | 11 refills | Status: DC

## 2024-04-03 NOTE — Progress Notes (Signed)
 Fill and Pull Catheter Removal  Patient is present today for a catheter removal.  200 ml of sterile water was instilled into the bladder when the patient felt the urge to urinate. 10 ml of water was then drained from the balloon.  A 16 FR coude foley cath was removed from the bladder no complications were noted .  Foley catheter intact and time of removal. Patient as then given some time to void on their own.  Patient cannot void  200 ml on their own after some time.  Patient tolerated well.  One oral prophylactic antibiotic given per MD orders  Performed by: Exie T. CMA  Follow up/ Additional notes: around 2:00/2:30PM for PVR  Bladder Scan completed today.  Patient can void prior to the bladder scan. Bladder scan result: 501  Performed By: Exie DASEN. CMA  Additional notes-  per verbal from NP [place] pt needs to increase tamsulosin  to twice daily and repeat VT in a week   Simple Catheter Placement  Due to urinary retention patient is present today for a foley cath placement.  Patient was cleaned and prepped in a sterile fashion with Betadinex3 . A 16 FR coude foley catheter was inserted, urine return was noted  500 ml, urine was Clear yellow in color.  The balloon was filled with 10cc of sterile water.  A leg bag was attached for drainage. Patient was also given a night bag to take home and was given instruction on how to change from one bag to another.  Patient was given instruction on proper catheter care.  Patient tolerated well, no complications were noted   Performed by: Exie DASEN. CMA  Additional notes/ Follow up:

## 2024-04-07 ENCOUNTER — Telehealth: Payer: Self-pay | Admitting: Urology

## 2024-04-07 ENCOUNTER — Telehealth: Payer: Self-pay

## 2024-04-07 DIAGNOSIS — R339 Retention of urine, unspecified: Secondary | ICD-10-CM

## 2024-04-07 NOTE — Telephone Encounter (Signed)
 Returned phone call to pt and advise to stop taking Rx until we receive next treatment options from a provider

## 2024-04-07 NOTE — Telephone Encounter (Signed)
 Scheduled the patient for LAAO on 05/29/2024. He will have a pre-procedure visit 9/16 in preparation. At that visit, will start a/c, obtain labs and EKG, and give the patient instructions and soap. No CT scan per Dr. Cindie given previous cross-sectional imaging and slightly increased creatinine. The patient was grateful for call and agreed with plan.

## 2024-04-07 NOTE — Telephone Encounter (Signed)
 Started taking flomax  and he has a rash that is itching

## 2024-04-08 MED ORDER — ALFUZOSIN HCL ER 10 MG PO TB24: 10.0000 mg | ORAL_TABLET | Freq: Every day | ORAL | 11 refills | Status: AC

## 2024-04-08 NOTE — Telephone Encounter (Signed)
 Called pt wife to let her know MD Dahlstedt change pt rx due to an allergic reaction pt wife voiced her understanding and confirmed pharmacy of choice

## 2024-04-08 NOTE — Addendum Note (Signed)
 Addended by: SAMMIE EXIE HERO on: 04/08/2024 10:34 AM   Modules accepted: Orders

## 2024-04-11 NOTE — Telephone Encounter (Signed)
 Patient switched from Tamsulosin  to Alfuzosin  and is still itching. She wants to know if it takes time for the Tamsulosin  to get out of his system.

## 2024-04-11 NOTE — Telephone Encounter (Signed)
 Return called to patient/wife and made them aware a message will be sent to MD on recommendation. Voiced understanding   [phone]

## 2024-04-11 NOTE — Telephone Encounter (Signed)
 Patient/wife was made aware and voiced understanding The medicine has been cleared but it might take a week or more for rash/itching to go away. If problem ask to see dermatologist or pcp

## 2024-04-14 ENCOUNTER — Ambulatory Visit: Payer: Self-pay

## 2024-04-14 ENCOUNTER — Ambulatory Visit (INDEPENDENT_AMBULATORY_CARE_PROVIDER_SITE_OTHER)

## 2024-04-14 DIAGNOSIS — R339 Retention of urine, unspecified: Secondary | ICD-10-CM

## 2024-04-14 LAB — BLADDER SCAN AMB NON-IMAGING: Scan Result: 283

## 2024-04-14 MED ORDER — CIPROFLOXACIN HCL 500 MG PO TABS
500.0000 mg | ORAL_TABLET | Freq: Once | ORAL | Status: AC
  Administered 2024-04-14: 500 mg via ORAL

## 2024-04-14 NOTE — Progress Notes (Addendum)
 Fill and Pull Catheter Removal  Patient is present today for a catheter removal.  300 ml of sterile water was instilled into the bladder when the patient felt the urge to urinate. 11 ml of water was then drained from the balloon.  A 16 FR Coude foley cath was removed from the bladder no complications were noted .  Foley catheter intact and time of removal. Patient as then given some time to void on their own.  Patient can void  50 ml on their own after some time.  Patient tolerated well.  One oral prophylactic antibiotic given per MD orders  Performed by: Carlos, CMA  Follow up/ Additional notes: Return at  1 pm for PVR  Bladder Scan completed today.  Patient can void prior to the bladder scan. Bladder scan result: 283  Performed By: Carlos, CMA  Additional notes-

## 2024-04-14 NOTE — Addendum Note (Signed)
 Addended by: GRETTA MASTERS R on: 04/14/2024 01:14 PM   Modules accepted: Orders

## 2024-04-17 ENCOUNTER — Telehealth: Payer: Self-pay | Admitting: Urology

## 2024-04-17 NOTE — Telephone Encounter (Signed)
 Wife called and made aware per last documentation patient is suppose to be taking the Allfuzosin. Wife voiced understanding.

## 2024-04-17 NOTE — Telephone Encounter (Signed)
 Patients wife called and she wants to know if he is to continue taking  alfuzosin  (UROXATRAL ) 10 MG 24 hr tablet  ? She couldn't remember if he was to stop it or continue.

## 2024-05-08 DIAGNOSIS — R051 Acute cough: Secondary | ICD-10-CM | POA: Diagnosis not present

## 2024-05-08 DIAGNOSIS — Z87891 Personal history of nicotine dependence: Secondary | ICD-10-CM | POA: Diagnosis not present

## 2024-05-12 NOTE — Progress Notes (Addendum)
  Electrophysiology Office Note:   Date:  05/12/2024  ID:  [name], DOB 01/27/1946, MRN [id number]  Primary Cardiologist: Oneil Parchment, MD Electrophysiologist: [name], MD   Electrophysiologist:  [name], MD      History of Present Illness:   [name] is a 78 y.o. male with h/o coronary artery disease, heart failure, atrial fibrillation, and mechanical fall with significant SDH on Xarelto  requiring bur hole seen today for routine electrophysiology followup.   The patient was seen by Arley Helling in neurosurgery in February of this year. Dr. Helling provided clearance for him to undergo evaluation for watchman and to use anticoagulation around the time of the procedure.   Since last being seen in our clinic the patient reports doing OK. He has occasional tachycardia at home. Takes prn diltiazem . Denies chest pain. No bleeding. No syncope, new SOB, or edema.   Review of systems complete and found to be negative unless listed in HPI.   EP Information / Studies Reviewed:    EKG is ordered today. Personal review as below.  EKG Interpretation Date/Time:  Tuesday May 13 2024 11:51:09 EDT Ventricular Rate:  121 PR Interval:    QRS Duration:  80 QT Interval:  314 QTC Calculation: 445 R Axis:   -5  Text Interpretation: Atrial fibrillation with rapid ventricular response with premature ventricular or aberrantly conducted complexes Confirmed by Lesia Sharper [phone]) on 05/13/2024 12:05:32 PM    Arrhythmia/Device History No specialty comments available.   Physical Exam:   VS:  There were no vitals taken for this visit.   Wt Readings from Last 3 Encounters:  03/07/24 135 lb (61.2 kg)  03/06/24 135 lb (61.2 kg)  08/28/23 144 lb 12.8 oz (65.7 kg)     GEN: No acute distress NECK: No JVD; No carotid bruits CARDIAC: Irregularly irregular rate and rhythm, no murmurs, rubs, gallops RESPIRATORY:  Clear to auscultation without rales, wheezing or rhonchi   ABDOMEN: Soft, non-tender, non-distended EXTREMITIES:  Trace edema; No deformity   ASSESSMENT AND PLAN:    Permanent AF EKG today shows AF with RVR. Intermittent at home per pt. Takes diltiazem  prn. CHA2DS2/VASc is at least 6. Has previously seen Dr. HOLTS and is planning for Watchman implant as a stroke risk mitigation strategy.  He has been cleared by Neurosurgery to proceed.   Planning for 05/29/2024 with TEE at the time   NYHA II symptoms chronically  Secondary hypercoagulable state H/o Subdural hematoma, traumatic Start eliquis  2.5 mg BID today. Plan for short term with usual cadence after Watchman implant  Follow up with EP Team as usual post procedure  Signed, Sharper Prentice Lesia, PA-C

## 2024-05-13 ENCOUNTER — Other Ambulatory Visit (HOSPITAL_COMMUNITY): Payer: Self-pay

## 2024-05-13 ENCOUNTER — Encounter: Payer: Self-pay | Admitting: Student

## 2024-05-13 ENCOUNTER — Ambulatory Visit: Attending: Student | Admitting: Student

## 2024-05-13 VITALS — BP 94/52 | HR 121 | Ht 65.0 in | Wt 136.0 lb

## 2024-05-13 DIAGNOSIS — Z8679 Personal history of other diseases of the circulatory system: Secondary | ICD-10-CM | POA: Diagnosis not present

## 2024-05-13 DIAGNOSIS — I4891 Unspecified atrial fibrillation: Secondary | ICD-10-CM | POA: Diagnosis not present

## 2024-05-13 DIAGNOSIS — D6869 Other thrombophilia: Secondary | ICD-10-CM | POA: Diagnosis not present

## 2024-05-13 MED ORDER — APIXABAN 2.5 MG PO TABS
2.5000 mg | ORAL_TABLET | Freq: Two times a day (BID) | ORAL | 0 refills | Status: DC
  Filled 2024-05-13: qty 60, 30d supply, fill #0

## 2024-05-13 NOTE — Patient Instructions (Signed)
 Medication Instructions:  Start eliquis  2.5 mg twice daily *If you need a refill on your cardiac medications before your next appointment, please call your pharmacy*  Lab Work: BMET, CBC-TODAY If you have labs (blood work) drawn today and your tests are completely normal, you will receive your results only by: MyChart Message (if you have MyChart) OR A paper copy in the mail If you have any lab test that is abnormal or we need to change your treatment, we will call you to review the results.  Testing/Procedures: See letter  Follow-Up: At [facility], you and your health needs are our priority.  As part of our continuing mission to provide you with exceptional heart care, our providers are all part of one team.  This team includes your primary Cardiologist (physician) and Advanced Practice Providers or APPs (Physician Assistants and Nurse Practitioners) who all work together to provide you with the care you need, when you need it.  Your next appointment:   As scheduled  We recommend signing up for the patient portal called MyChart.  Sign up information is provided on this After Visit Summary.  MyChart is used to connect with patients for Virtual Visits (Telemedicine).  Patients are able to view lab/test results, encounter notes, upcoming appointments, etc.  Non-urgent messages can be sent to your provider as well.   To learn more about what you can do with MyChart, go to [email or web address].

## 2024-05-14 ENCOUNTER — Ambulatory Visit: Payer: Self-pay | Admitting: Student

## 2024-05-14 DIAGNOSIS — E1169 Type 2 diabetes mellitus with other specified complication: Secondary | ICD-10-CM | POA: Diagnosis not present

## 2024-05-14 DIAGNOSIS — E782 Mixed hyperlipidemia: Secondary | ICD-10-CM | POA: Diagnosis not present

## 2024-05-14 LAB — CBC
Hematocrit: 39.6 % (ref 37.5–51.0)
Hemoglobin: 12.6 g/dL — ABNORMAL LOW (ref 13.0–17.7)
MCH: 31.8 pg (ref 26.6–33.0)
MCHC: 31.8 g/dL (ref 31.5–35.7)
MCV: 100 fL — ABNORMAL HIGH (ref 79–97)
Platelets: 163 x10E3/uL (ref 150–450)
RBC: 3.96 x10E6/uL — ABNORMAL LOW (ref 4.14–5.80)
RDW: 13 % (ref 11.6–15.4)
WBC: 6.7 x10E3/uL (ref 3.4–10.8)

## 2024-05-14 LAB — BASIC METABOLIC PANEL WITH GFR
BUN/Creatinine Ratio: 12 (ref 10–24)
BUN: 15 mg/dL (ref 8–27)
CO2: 21 mmol/L (ref 20–29)
Calcium: 9 mg/dL (ref 8.6–10.2)
Chloride: 106 mmol/L (ref 96–106)
Creatinine, Ser: 1.28 mg/dL — AB (ref 0.76–1.27)
Glucose: 109 mg/dL — AB (ref 70–99)
Potassium: 4.4 mmol/L (ref 3.5–5.2)
Sodium: 141 mmol/L (ref 134–144)
eGFR: 57 mL/min/1.73 — AB (ref >59)

## 2024-05-15 ENCOUNTER — Telehealth: Payer: Self-pay | Admitting: Cardiology

## 2024-05-15 NOTE — Telephone Encounter (Signed)
 Pt wife called in and stated that the pt has COPD and sometime needs Cough meds.  She would like to know what is the best one for him to take?  She has a couple question about the instruction prior to his procedure   Best number [phone]

## 2024-05-15 NOTE — Telephone Encounter (Signed)
 Wife asked coricidin and mucinex  were ok to take if he does not want to take rx dr.hall gave him for cough, yes it it ok to take plain - without dextromethorphan- mucinex  and coricidin   Patient also confirmed that surgical soap prep should not be used on genitals.

## 2024-05-19 ENCOUNTER — Ambulatory Visit

## 2024-05-20 ENCOUNTER — Telehealth: Payer: Self-pay

## 2024-05-20 DIAGNOSIS — R339 Retention of urine, unspecified: Secondary | ICD-10-CM | POA: Diagnosis not present

## 2024-05-20 DIAGNOSIS — E1169 Type 2 diabetes mellitus with other specified complication: Secondary | ICD-10-CM | POA: Diagnosis not present

## 2024-05-20 DIAGNOSIS — N1831 Chronic kidney disease, stage 3a: Secondary | ICD-10-CM | POA: Diagnosis not present

## 2024-05-20 DIAGNOSIS — E782 Mixed hyperlipidemia: Secondary | ICD-10-CM | POA: Diagnosis not present

## 2024-05-20 DIAGNOSIS — I6529 Occlusion and stenosis of unspecified carotid artery: Secondary | ICD-10-CM | POA: Diagnosis not present

## 2024-05-20 DIAGNOSIS — F411 Generalized anxiety disorder: Secondary | ICD-10-CM | POA: Diagnosis not present

## 2024-05-20 DIAGNOSIS — I480 Paroxysmal atrial fibrillation: Secondary | ICD-10-CM | POA: Diagnosis not present

## 2024-05-20 DIAGNOSIS — I25810 Atherosclerosis of coronary artery bypass graft(s) without angina pectoris: Secondary | ICD-10-CM | POA: Diagnosis not present

## 2024-05-20 DIAGNOSIS — D631 Anemia in chronic kidney disease: Secondary | ICD-10-CM | POA: Diagnosis not present

## 2024-05-20 DIAGNOSIS — I130 Hypertensive heart and chronic kidney disease with heart failure and stage 1 through stage 4 chronic kidney disease, or unspecified chronic kidney disease: Secondary | ICD-10-CM | POA: Diagnosis not present

## 2024-05-20 DIAGNOSIS — I5022 Chronic systolic (congestive) heart failure: Secondary | ICD-10-CM | POA: Diagnosis not present

## 2024-05-20 DIAGNOSIS — S065X0D Traumatic subdural hemorrhage without loss of consciousness, subsequent encounter: Secondary | ICD-10-CM | POA: Diagnosis not present

## 2024-05-20 NOTE — Telephone Encounter (Signed)
 Confirmed procedure date of 05/29/2024. Confirmed NEW arrival time of 1015 for procedure time at 1245. Reviewed pre-procedure instructions with patient. He will take Jardiance  9/28 then STOP. All other med instructions reviewed. Confirmed no contrast allergy. Confirmed he has no current PPM or defibrillator. The patient understands to call if questions/concerns arise prior to procedure. He was grateful for call and agreed with plan.

## 2024-05-23 ENCOUNTER — Ambulatory Visit (HOSPITAL_COMMUNITY)
Admission: RE | Admit: 2024-05-23 | Discharge: 2024-05-23 | Disposition: A | Discharge status: Home / Self Care | Source: Ambulatory Visit

## 2024-05-23 ENCOUNTER — Other Ambulatory Visit (HOSPITAL_COMMUNITY): Payer: Self-pay

## 2024-05-23 DIAGNOSIS — S0990XA Unspecified injury of head, initial encounter: Secondary | ICD-10-CM

## 2024-05-23 DIAGNOSIS — W228XXA Striking against or struck by other objects, initial encounter: Secondary | ICD-10-CM | POA: Diagnosis not present

## 2024-05-23 DIAGNOSIS — S0083XA Contusion of other part of head, initial encounter: Secondary | ICD-10-CM | POA: Diagnosis not present

## 2024-05-23 DIAGNOSIS — Y990 Civilian activity done for income or pay: Secondary | ICD-10-CM | POA: Diagnosis not present

## 2024-05-23 DIAGNOSIS — I6782 Cerebral ischemia: Secondary | ICD-10-CM | POA: Diagnosis not present

## 2024-05-27 ENCOUNTER — Other Ambulatory Visit: Payer: Self-pay

## 2024-05-27 DIAGNOSIS — Z8679 Personal history of other diseases of the circulatory system: Secondary | ICD-10-CM

## 2024-05-27 DIAGNOSIS — I4891 Unspecified atrial fibrillation: Secondary | ICD-10-CM

## 2024-05-28 ENCOUNTER — Telehealth: Payer: Self-pay

## 2024-05-28 ENCOUNTER — Other Ambulatory Visit (HOSPITAL_COMMUNITY): Payer: Self-pay | Admitting: *Deleted

## 2024-05-28 MED ORDER — DILTIAZEM HCL 30 MG PO TABS: ORAL_TABLET | ORAL | 1 refills | Status: DC

## 2024-05-28 NOTE — Telephone Encounter (Signed)
 Patient is taking alfuzosin  (UROXATRAL ) 10 MG 24 hr tablet   Patient is having to urinate almost every 30 minutes.  Asking if this is normal.  Please call wife - [phone]

## 2024-05-28 NOTE — Progress Notes (Signed)
 Patient was called to be informed that the surgery time for tomorrow was changed to 11:00 o'clock. Patient was aware and he and his wife verbalized that they will be at the hospital tomorrow at 08:30 o'clock.

## 2024-05-28 NOTE — Telephone Encounter (Signed)
 Pt wife called to let us  know the alfuzosin  is causing pt to urinate almost every 30 minutes what should they do? Pt is having heart surgery tomorrow so asked that we called pt wife cell phone for any update if any tomorrow pt wife advised message would be sent to provider for advisement

## 2024-05-29 ENCOUNTER — Encounter (HOSPITAL_COMMUNITY)
Admission: RE | Disposition: A | Discharge status: Home / Self Care | Payer: Self-pay | Source: Home / Self Care | Attending: Cardiology

## 2024-05-29 ENCOUNTER — Inpatient Hospital Stay (HOSPITAL_COMMUNITY): Admitting: Certified Registered Nurse Anesthetist

## 2024-05-29 ENCOUNTER — Inpatient Hospital Stay (HOSPITAL_COMMUNITY)

## 2024-05-29 ENCOUNTER — Encounter (HOSPITAL_COMMUNITY): Payer: Self-pay | Admitting: Cardiology

## 2024-05-29 ENCOUNTER — Ambulatory Visit (HOSPITAL_COMMUNITY)
Admission: RE | Admit: 2024-05-29 | Discharge: 2024-05-29 | Disposition: A | Discharge status: Home / Self Care | Attending: Cardiology | Admitting: Cardiology

## 2024-05-29 DIAGNOSIS — I4891 Unspecified atrial fibrillation: Secondary | ICD-10-CM | POA: Diagnosis not present

## 2024-05-29 DIAGNOSIS — Z538 Procedure and treatment not carried out for other reasons: Secondary | ICD-10-CM | POA: Insufficient documentation

## 2024-05-29 DIAGNOSIS — Z8679 Personal history of other diseases of the circulatory system: Secondary | ICD-10-CM | POA: Insufficient documentation

## 2024-05-29 LAB — TYPE AND SCREEN
ABO/RH(D): A POS
Antibody Screen: NEGATIVE

## 2024-05-29 LAB — SURGICAL PCR SCREEN
MRSA, PCR: NEGATIVE
Staphylococcus aureus: NEGATIVE

## 2024-05-29 SURGERY — LEFT ATRIAL APPENDAGE OCCLUSION
Anesthesia: General

## 2024-05-29 MED ORDER — CEFAZOLIN SODIUM-DEXTROSE 2-4 GM/100ML-% IV SOLN
2.0000 g | INTRAVENOUS | Status: DC
  Filled 2024-05-29: qty 100

## 2024-05-29 MED ORDER — LACTATED RINGERS IV SOLN: INTRAVENOUS | Status: DC

## 2024-05-29 MED ORDER — FENTANYL CITRATE (PF) 100 MCG/2ML IJ SOLN
INTRAMUSCULAR | Status: AC
  Filled 2024-05-29: qty 2

## 2024-05-29 MED ORDER — CHLORHEXIDINE GLUCONATE 0.12 % MT SOLN
OROMUCOSAL | Status: AC
  Filled 2024-05-29: qty 15

## 2024-05-29 MED ORDER — SODIUM CHLORIDE 0.9 % IV SOLN: INTRAVENOUS | Status: DC

## 2024-05-29 NOTE — Anesthesia Preprocedure Evaluation (Addendum)
 Anesthesia Evaluation  Patient identified by MRN, date of birth, ID band Patient awake    Reviewed: Allergy & Precautions, NPO status , Patient's Chart, lab work & pertinent test results, reviewed documented beta blocker date and time   History of Anesthesia Complications Negative for: history of anesthetic complications  Airway Mallampati: II  TM Distance: >3 FB     Dental  (+) Missing, Poor Dentition   Pulmonary neg shortness of breath, COPD,  COPD inhaler, former smoker   breath sounds clear to auscultation       Cardiovascular hypertension, + CAD and +CHF  + dysrhythmias Atrial Fibrillation  Rhythm:Irregular Rate:Normal  IMPRESSIONS     1. Left ventricular ejection fraction, by estimation, is 55 to 60%. The  left ventricle has normal function. Left ventricular endocardial border  not optimally defined to evaluate regional wall motion. There is mild left  ventricular hypertrophy. Left  ventricular diastolic parameters are indeterminate.   2. Right ventricular systolic function is low normal. The right  ventricular size is normal. There is mildly elevated pulmonary artery  systolic pressure.   3. Left atrial size was moderately dilated.   4. Right atrial size was mildly dilated.   5. The mitral valve is normal in structure. Trivial mitral valve  regurgitation. No evidence of mitral stenosis.   6. The tricuspid valve is abnormal. Tricuspid valve regurgitation is mild  to moderate.   7. The aortic valve is tricuspid. There is mild calcification of the  aortic valve. There is mild thickening of the aortic valve. Aortic valve  regurgitation is not visualized. No aortic stenosis is present.   8. The inferior vena cava is normal in size with greater than 50%  respiratory variability, suggesting right atrial pressure of 3 mmHg.      Neuro/Psych  Headaches PSYCHIATRIC DISORDERS Anxiety        GI/Hepatic hiatal hernia,,,(+)  neg Cirrhosis        Endo/Other  diabetes    Renal/GU CRFRenal disease     Musculoskeletal   Abdominal   Peds  Hematology   Anesthesia Other Findings   Reproductive/Obstetrics                              Anesthesia Physical Anesthesia Plan  ASA: 3  Anesthesia Plan: General   Post-op Pain Management:    Induction: Intravenous  PONV Risk Score and Plan: 2 and Ondansetron  and Dexamethasone   Airway Management Planned: Natural Airway and Simple Face Mask  Additional Equipment:   Intra-op Plan:   Post-operative Plan: Extubation in OR  Informed Consent: I have reviewed the patients History and Physical, chart, labs and discussed the procedure including the risks, benefits and alternatives for the proposed anesthesia with the patient or authorized representative who has indicated his/her understanding and acceptance.     Dental advisory given  Plan Discussed with: CRNA  Anesthesia Plan Comments:         Anesthesia Quick Evaluation

## 2024-05-29 NOTE — Progress Notes (Signed)
 Patient has a hematoma on Left forehead. Per Dr. Cindie, this is a contraindication for the heparin  drip that is required during surgery.  Patient will need to wait 6 weeks before he can have this surgery.

## 2024-05-30 NOTE — Telephone Encounter (Signed)
 Attempted to call pt in regard to Rx pt did not answer unable to lvm

## 2024-06-02 ENCOUNTER — Telehealth: Payer: Self-pay

## 2024-06-02 ENCOUNTER — Other Ambulatory Visit: Payer: Self-pay

## 2024-06-02 DIAGNOSIS — I4821 Permanent atrial fibrillation: Secondary | ICD-10-CM

## 2024-06-02 DIAGNOSIS — Z8679 Personal history of other diseases of the circulatory system: Secondary | ICD-10-CM

## 2024-06-02 NOTE — Telephone Encounter (Signed)
 The patient was scheduled for LAAO 05/29/2024 but was cancelled DOS due to hematoma on his forehead from an incident with a wrench a few days before the procedure. Per Dr. Cindie, the patient needed to wait 6-8 weeks to reschedule.  Spoke with the patient. Rescheduled him to 07/31/2024. He will have pre-procedure visit 11/17 to ensure he is healed and still tolerating Eliquis . He was grateful for call and agreed with plan.

## 2024-06-03 ENCOUNTER — Telehealth: Payer: Self-pay | Admitting: Student

## 2024-06-03 DIAGNOSIS — I480 Paroxysmal atrial fibrillation: Secondary | ICD-10-CM

## 2024-06-03 MED ORDER — APIXABAN 2.5 MG PO TABS: 2.5000 mg | ORAL_TABLET | Freq: Two times a day (BID) | ORAL | 5 refills | Status: AC

## 2024-06-03 NOTE — Telephone Encounter (Signed)
 Eliquis  2.5mg  refill request received. Patient is 78 years old, weight-61.7kg, Crea-1.28 on 05/13/24, Diagnosis-Afib, and last seen by Jodie Passey on 05/13/24.   Per 05/13/24 OV note, it states:  Secondary hypercoagulable state H/o Subdural hematoma, traumatic Start eliquis  2.5 mg BID today. Plan for short term with usual cadence after Watchman implant  Watchman scheduled 07/31/24

## 2024-06-03 NOTE — Telephone Encounter (Signed)
*  STAT* If patient is at the pharmacy, call can be transferred to refill team.   1. Which medications need to be refilled? (please list name of each medication and dose if known) apixaban  (ELIQUIS ) 2.5 MG TABS tablet    2. Would you like to learn more about the convenience, safety, & potential cost savings by using the [facility]?     3. Are you open to using the [facility] (Type [facility].  ).   4. Which pharmacy/location (including street and city if local pharmacy) is medication to be sent to? [id number] - [place],  - [address]    5. Do they need a 30 day or 90 day supply? 30 day

## 2024-06-05 ENCOUNTER — Telehealth: Payer: Self-pay | Admitting: Urology

## 2024-06-05 ENCOUNTER — Telehealth: Payer: Self-pay | Admitting: Cardiology

## 2024-06-05 DIAGNOSIS — S0990XD Unspecified injury of head, subsequent encounter: Secondary | ICD-10-CM | POA: Diagnosis not present

## 2024-06-05 DIAGNOSIS — S0990XA Unspecified injury of head, initial encounter: Secondary | ICD-10-CM | POA: Diagnosis not present

## 2024-06-05 NOTE — Telephone Encounter (Signed)
 I returned patients call, he denied UTI symptoms at this time and states he just has nocturia that is worsening.  Office visit scheduled with Dr. Matilda.

## 2024-06-05 NOTE — Telephone Encounter (Signed)
 Spoke with pt's wife. Pt's wife stated that the pt still has the knot on his head from the head injury 3 weeks ago and they wanted Jodie Passey, PA-C to look at it and check it out. Advised for pt to call PCP regarding this issue because this specific concern is not cardiac related. Pt's wife verbalized understanding and stated they would call PCP office.

## 2024-06-05 NOTE — Telephone Encounter (Signed)
 Wife Mariellen) stated patient was supposed to get a Watchman put in but had a head injury on 9/25 and procedure was canceled.  Wife stated patient still has a knot on his forehead and is concerned it is not breaking up.

## 2024-06-05 NOTE — Telephone Encounter (Signed)
 Wife called into the office today with general questions/concerns regarding patient is having frequent urination and it is worse at night. Patient may be reached at [phone] to discuss questions.

## 2024-06-06 ENCOUNTER — Telehealth: Payer: Self-pay | Admitting: Student

## 2024-06-06 NOTE — Telephone Encounter (Signed)
 Pt c/o medication issue:  1. Name of Medication:   apixaban  (ELIQUIS ) 2.5 MG TABS tablet    2. How are you currently taking this medication (dosage and times per day)?   3. Are you having a reaction (difficulty breathing--STAT)? No  4. What is your medication issue? Pt's wife would like a c/b regarding whether pt is to continue taking medication until procedure date. Please advise

## 2024-06-06 NOTE — Telephone Encounter (Signed)
 Spoke with pt's wife per DPR regarding Eliquis . Wife was notified that the pt is to continue Eliquis  2.5 mg twice daily per office note on 9/16 until instructed otherwise for procedure. Wife was told someone would be reaching out to them with instructions prior to procedure. Wife verbalized understanding. All questions if any were answered.

## 2024-06-08 NOTE — Progress Notes (Signed)
 H&P  Chief Complaint: Urinary difficulties  History of Present Illness: 78 year old male comes in today for evaluation/management of lower urinary tract symptoms, specifically nocturia.  He was seen here 2 to 3 months ago for urinary retention.  He developed shingles of the genital area and had temporary urinary retention.  His catheter is now out.  He is on alfuzosin .  He feels like he empties well.  His biggest issue is nocturia.  He has some mild daytime urgency but no daytime frequency.  He wakes up between 2 and 5 times a night to urinate.  He does not drink caffeine in the afternoon or evening.  He does try to limit his fluid intake in the evening.  Past Medical History:  Diagnosis Date   AAA (abdominal aortic aneurysm)    06/27/20 MRI: 3.0 cm infrarenal AAA, recommend 3 year follow-up   Anxiety    Atrial fibrillation (HCC)    Bilateral carotid artery disease 07/21/2013   CAD (coronary artery disease) 2008   CABG   COPD (chronic obstructive pulmonary disease) (HCC)    Dysrhythmia    A-fib   Gastritis    Headache(784.0)    History of hiatal hernia    Hypertension    SDH (subdural hematoma) (HCC) 01/31/2023   Visit for monitoring Tikosyn  therapy 12/2015    Past Surgical History:  Procedure Laterality Date   BURR HOLE Left 03/27/2023   Procedure: BURR HOLE EVASCUATION OF SUBDURAL HEMATOMA;  Surgeon: Cheryle Debby LABOR, MD;  Location: [facility] OR;  Service: Neurosurgery;  Laterality: Left;   CARDIOVERSION N/A 09/08/2015   Procedure: CARDIOVERSION;  Surgeon: Aleene JINNY Passe, MD;  Location: [facility] ENDOSCOPY;  Service: Cardiovascular;  Laterality: N/A;   CORONARY ARTERY BYPASS GRAFT     EXCISION OF KELOID Left 10/08/2018   Procedure: EXCISION OF CHRONIC ABDOMINAL WALL WOUND;  Surgeon: Vernetta Berg, MD;  Location: [facility];  Service: General;  Laterality: Left;   INCISION AND DRAINAGE DEEP NECK ABSCESS     IR ANGIO EXTERNAL CAROTID SEL EXT CAROTID UNI L MOD SED   04/17/2023   IR ANGIO INTRA EXTRACRAN SEL COM CAROTID INNOMINATE UNI L MOD SED  04/17/2023   IR NEURO EACH ADD'L AFTER BASIC UNI LEFT (MS)  04/17/2023   LUMBAR LAMINECTOMY/DECOMPRESSION MICRODISCECTOMY Right 08/09/2020   Procedure: Laminectomy and Foraminotomy - Lumbar three-Lumbar four - Lumbar four-Lumbar five- right;  Surgeon: Onetha Kuba, MD;  Location: [facility] OR;  Service: Neurosurgery;  Laterality: Right;   RADIOLOGY WITH ANESTHESIA N/A 04/17/2023   Procedure: Left MMA;  Surgeon: Lanis Pupa, MD;  Location: [facility] OR;  Service: Radiology;  Laterality: N/A;   ROTATOR CUFF REPAIR Bilateral    TEE WITHOUT CARDIOVERSION N/A 08/02/2015   Procedure: TRANSESOPHAGEAL ECHOCARDIOGRAM (TEE);  Surgeon: Wilbert JONELLE Bihari, MD;  Location: [facility] ENDOSCOPY;  Service: Cardiovascular;  Laterality: N/A;   TEE WITHOUT CARDIOVERSION N/A 09/08/2015   Procedure: TRANSESOPHAGEAL ECHOCARDIOGRAM (TEE);  Surgeon: Aleene JINNY Passe, MD;  Location: [facility] ENDOSCOPY;  Service: Cardiovascular;  Laterality: N/A;    Home Medications:  Allergies as of 06/10/2024       Reactions   Vibramycin [doxycycline] Hives, Shortness Of Breath   Increased urinary frequency   Asa [aspirin] Hypertension   Told to avoid due to heart condition   Nsaids Hypertension   Told to avoid due to heart condition        Medication List        Accurate as of June 08, 2024 10:10 AM. If you have  any questions, ask your nurse or doctor.          Acetaminophen  Extra Strength 500 MG Tabs Take 1 tablet (500 mg total) by mouth every 6 (six) hours for 8 days then as directed by MD   albuterol  108 (90 Base) MCG/ACT inhaler Commonly known as: VENTOLIN  HFA Inhale 2 puffs into the lungs every 4 (four) hours as needed.   alfuzosin  10 MG 24 hr tablet Commonly known as: UROXATRAL  Take 1 tablet (10 mg total) by mouth daily with breakfast.   ALPRAZolam  0.25 MG tablet Commonly known as: XANAX  TAKE 1 TABLET(0.25 MG) BY MOUTH DAILY   apixaban  2.5 MG Tabs  tablet Commonly known as: ELIQUIS  Take 1 tablet (2.5 mg total) by mouth 2 (two) times daily.   ascorbic acid  500 MG tablet Commonly known as: VITAMIN C Take 500 mg by mouth at bedtime.   atorvastatin  40 MG tablet Commonly known as: LIPITOR TAKE 1 TABLET(40 MG) BY MOUTH DAILY   CENTRUM SILVER PO Take 1 tablet by mouth at bedtime.   CORICIDIN HBP MAX STRENGTH FLU PO Take 15 mLs by mouth daily as needed (cough/COPD).   diltiazem  30 MG tablet Commonly known as: CARDIZEM  TAKE 1 TABLET BY MOUTH EVERY 4 HOURS AS NEEDED FOR HEART RATE>100 AS LONG AS BLOOD PRESSURE>100   empagliflozin  10 MG Tabs tablet Commonly known as: JARDIANCE  Take 1 tablet (10 mg total) by mouth daily.   Fish Oil 1000 MG Caps Take 1,200 mg by mouth at bedtime.   furosemide  20 MG tablet Commonly known as: Lasix  Take 1 tablet (20 mg total) by mouth daily. What changed: when to take this   loperamide 2 MG tablet Commonly known as: IMODIUM A-D Take 2 mg by mouth as needed for diarrhea or loose stools.   losartan  25 MG tablet Commonly known as: COZAAR  Take 1 tablet (25 mg total) by mouth daily.   metoprolol  succinate 25 MG 24 hr tablet Commonly known as: Toprol  XL Take 1 tablet (25 mg total) by mouth in the morning and at bedtime.   nitroGLYCERIN  0.4 MG SL tablet Commonly known as: NITROSTAT  Place 1 tablet (0.4 mg total) under the tongue every 5 (five) minutes as needed for chest pain.   potassium chloride  10 MEQ tablet Commonly known as: KLOR-CON  M Take 2 tablets (20 mEq total) by mouth daily. What changed: when to take this   TUSSIN COUGH PO Take 15 mLs by mouth daily as needed (Cough/COPD).        Allergies:  Allergies  Allergen Reactions   Vibramycin [Doxycycline] Hives and Shortness Of Breath    Increased urinary frequency   Asa [Aspirin] Hypertension    Told to avoid due to heart condition   Nsaids Hypertension    Told to avoid due to heart condition    Family History  Problem  Relation Age of Onset   Diabetes Mother    Hypertension Mother    Heart attack Father    Sudden death Father    Hypertension Sister    Diabetes Sister     Social History:  reports that he quit smoking about 65 years ago. His smoking use included cigarettes. He quit smokeless tobacco use about 21 months ago.  His smokeless tobacco use included chew. He reports that he does not drink alcohol and does not use drugs.  ROS: A complete review of systems was performed.  All systems are negative except for pertinent findings as noted.  Physical Exam:  Vital signs in  last 24 hours: There were no vitals taken for this visit. Constitutional:  Alert and oriented, No acute distress Cardiovascular: Regular rate  Respiratory: Normal respiratory effort GI: Abdomen is soft, nontender, nondistended, no abdominal masses. No CVAT.  Genitourinary: Normal male phallus, testes are descended bilaterally and non-tender and without masses, scrotum is normal in appearance without lesions or masses, perineum is normal on inspection. Prostate 25 mL,, non nodular Lymphatic: No lymphadenopathy Neurologic: Grossly intact, no focal deficits Psychiatric: Normal mood and affect  I have reviewed prior pt notes  I have reviewed notes from referring/previous physicians--ER notes  I have reviewed urinalysis results-clear  I have independently reviewed prior imaging--bladder scan volume today 25 mL    Impression/Assessment:  1.  History of urinary retention from shingles of the pelvic region, resolved  2.  Nocturia.  He does empty well.  Plan:  1.  Instructed patient to limit afternoon and evening fluids, continue to limit caffeine as well as sodium  2.  I sent in a prescription for oxybutynin 5 mg for nighttime use  3.  He will continue on the alfuzosin   4.  I will see back next available just to do a quick check on his symptoms.  If he is doing well we may consider stopping the alfuzosin 

## 2024-06-10 ENCOUNTER — Encounter: Payer: Self-pay | Admitting: Urology

## 2024-06-10 ENCOUNTER — Ambulatory Visit: Admitting: Urology

## 2024-06-10 VITALS — BP 124/72 | HR 65

## 2024-06-10 DIAGNOSIS — R339 Retention of urine, unspecified: Secondary | ICD-10-CM

## 2024-06-10 DIAGNOSIS — Z87448 Personal history of other diseases of urinary system: Secondary | ICD-10-CM | POA: Diagnosis not present

## 2024-06-10 DIAGNOSIS — R351 Nocturia: Secondary | ICD-10-CM | POA: Diagnosis not present

## 2024-06-10 LAB — URINALYSIS, ROUTINE W REFLEX MICROSCOPIC
Bilirubin, UA: NEGATIVE
Ketones, UA: NEGATIVE
Leukocytes,UA: NEGATIVE
Nitrite, UA: NEGATIVE
Protein,UA: NEGATIVE
RBC, UA: NEGATIVE
Specific Gravity, UA: 1.015 (ref 1.005–1.030)
Urobilinogen, Ur: 1 mg/dL (ref 0.2–1.0)
pH, UA: 6 (ref 5.0–7.5)

## 2024-06-10 LAB — BLADDER SCAN AMB NON-IMAGING: Scan Result: 25

## 2024-06-10 MED ORDER — OXYBUTYNIN CHLORIDE 5 MG PO TABS
ORAL_TABLET | ORAL | 11 refills | Status: AC
Start: 2024-06-10 — End: ?

## 2024-06-10 NOTE — Progress Notes (Signed)
   Patient can void prior to the bladder scan. Bladder scan result: 25  Performed By: [facility] LPN

## 2024-06-11 DIAGNOSIS — N1831 Chronic kidney disease, stage 3a: Secondary | ICD-10-CM | POA: Diagnosis not present

## 2024-06-11 DIAGNOSIS — D649 Anemia, unspecified: Secondary | ICD-10-CM | POA: Diagnosis not present

## 2024-06-21 ENCOUNTER — Other Ambulatory Visit: Payer: Self-pay | Admitting: Cardiology

## 2024-06-24 ENCOUNTER — Encounter (HOSPITAL_COMMUNITY): Payer: Self-pay

## 2024-06-24 ENCOUNTER — Other Ambulatory Visit (HOSPITAL_COMMUNITY): Payer: Self-pay

## 2024-06-24 DIAGNOSIS — Z9181 History of falling: Secondary | ICD-10-CM

## 2024-06-24 DIAGNOSIS — S0012XD Contusion of left eyelid and periocular area, subsequent encounter: Secondary | ICD-10-CM | POA: Diagnosis not present

## 2024-06-24 DIAGNOSIS — W108XXD Fall (on) (from) other stairs and steps, subsequent encounter: Secondary | ICD-10-CM | POA: Diagnosis not present

## 2024-06-24 DIAGNOSIS — S0990XA Unspecified injury of head, initial encounter: Secondary | ICD-10-CM

## 2024-06-25 ENCOUNTER — Ambulatory Visit (HOSPITAL_COMMUNITY)
Admission: RE | Admit: 2024-06-25 | Discharge: 2024-06-25 | Disposition: A | Discharge status: Home / Self Care | Source: Ambulatory Visit

## 2024-06-25 DIAGNOSIS — S0003XA Contusion of scalp, initial encounter: Secondary | ICD-10-CM | POA: Diagnosis not present

## 2024-06-25 DIAGNOSIS — S0990XA Unspecified injury of head, initial encounter: Secondary | ICD-10-CM | POA: Insufficient documentation

## 2024-06-25 DIAGNOSIS — Z9181 History of falling: Secondary | ICD-10-CM | POA: Insufficient documentation

## 2024-06-25 DIAGNOSIS — R220 Localized swelling, mass and lump, head: Secondary | ICD-10-CM | POA: Diagnosis not present

## 2024-06-25 DIAGNOSIS — I6782 Cerebral ischemia: Secondary | ICD-10-CM | POA: Diagnosis not present

## 2024-06-26 ENCOUNTER — Telehealth: Payer: Self-pay | Admitting: Cardiology

## 2024-06-26 NOTE — Telephone Encounter (Signed)
 Spoke with pt's wife, DPR who reports pt's HR last night 30 minutes after taking Metoprolol  25mg  was 47.  Pt denied any symptoms of CP, SOB or dizziness.  BP was WNL.  BP and HR this morning is 121/68 with HR of 89. Pt's wife states normally patient's HR is in the 80's and 90's even with Metoprolol . Pt's wife advised 60-100 is considered normal heart rate and possibly if pt were having palpitations the BP monitor could not accurately calculate the HR.  Encouraged to continue monitoring and taking medications as prescribed.  Contact office if HR continues to run low or pt becomes symptomatic.  Reviewed ED precautions.  Keep appointment with Jodie Passey, PA-C as previously scheduled for 07/14/2024.  Pt's wife verbalizes understanding and agrees with current plan.

## 2024-06-26 NOTE — Telephone Encounter (Signed)
 STAT if HR is under 50 or over 120 (normal HR is 60-100 beats per minute)  What is your heart rate? Last night it was 46  Do you have a log of your heart rate readings (document readings)? 90, 90, 76, 157, 88  Do you have any other symptoms? Wife wants to know how low his HR can go to before it's in the danger zone. She stated it has been in the 50's before but never drop below 50.

## 2024-07-14 ENCOUNTER — Ambulatory Visit: Attending: Student | Admitting: Student

## 2024-07-14 ENCOUNTER — Encounter: Payer: Self-pay | Admitting: Student

## 2024-07-14 VITALS — BP 117/75 | HR 82 | Ht 65.0 in | Wt 140.2 lb

## 2024-07-14 DIAGNOSIS — D6869 Other thrombophilia: Secondary | ICD-10-CM | POA: Diagnosis not present

## 2024-07-14 DIAGNOSIS — I4821 Permanent atrial fibrillation: Secondary | ICD-10-CM | POA: Insufficient documentation

## 2024-07-14 DIAGNOSIS — R001 Bradycardia, unspecified: Secondary | ICD-10-CM | POA: Insufficient documentation

## 2024-07-14 DIAGNOSIS — Z8679 Personal history of other diseases of the circulatory system: Secondary | ICD-10-CM | POA: Insufficient documentation

## 2024-07-14 NOTE — Progress Notes (Signed)
  Electrophysiology Office Note:   Date:  07/14/2024  ID:  [name], [name] 07/22/1946, MRN [id number]  Primary Cardiologist: Oneil Parchment, MD Electrophysiologist: [name], MD   Electrophysiologist:  [name], MD      History of Present Illness:   [name] is a 78 y.o. male with h/o coronary artery disease, heart failure, atrial fibrillation, and mechanical fall with significant SDH on Xarelto  seen today for routine electrophysiology followup.   Had been scheduled for Watchman in October, but hit his head with a wrench and developed a significant hematoma, was rescheduled.   Since last being seen in our clinic the patient reports doing well overall. Has had intermittent bradycardia on home pulse ox, but no symptoms. Otherwise, he denies chest pain, palpitations, dyspnea, PND, orthopnea, nausea, vomiting, dizziness, syncope, edema, weight gain, or early satiety.   Review of systems complete and found to be negative unless listed in HPI.   EP Information / Studies Reviewed:    EKG is ordered today. Personal review as below.  EKG Interpretation Date/Time:  Monday July 14 2024 11:33:55 EST Ventricular Rate:  82 PR Interval:    QRS Duration:  78 QT Interval:  370 QTC Calculation: 432 R Axis:   17  Text Interpretation: Atrial fibrillation with premature ventricular or aberrantly conducted complexes Confirmed by Lesia Sharper [phone]) on 07/14/2024 11:37:40 AM    Arrhythmia/Device History No specialty comments available.   Physical Exam:   VS:  BP 117/75   Pulse 82   Ht 5' 5 (1.651 m)   Wt 140 lb 3.2 oz (63.6 kg)   SpO2 95%   BMI 23.33 kg/m    Wt Readings from Last 3 Encounters:  07/14/24 140 lb 3.2 oz (63.6 kg)  05/13/24 136 lb (61.7 kg)  03/07/24 135 lb (61.2 kg)     GEN: No acute distress NECK: No JVD; No carotid bruits CARDIAC: Irregularly irregular rate and rhythm, no murmurs, rubs, gallops RESPIRATORY:  Clear to auscultation without  rales, wheezing or rhonchi  ABDOMEN: Soft, non-tender, non-distended EXTREMITIES:  No edema; No deformity   ASSESSMENT AND PLAN:    Permanent AF Secondary hypercoagulable state H/o subdural hematoma, traumatic EKG today shows AF with controlled rate. Continue diltiazem  prn Continue low dose eliquis  at 2.5 mg BID as short term for usual cadence after watchman implant  Bradycardia Asymptomatic, no change in meds for now  Follow up with EP Team as usual post procedure  Signed, Sharper Prentice Lesia, PA-C

## 2024-07-14 NOTE — Patient Instructions (Signed)
 Medication Instructions:  Your physician recommends that you continue on your current medications as directed. Please refer to the Current Medication list given to you today.  *If you need a refill on your cardiac medications before your next appointment, please call your pharmacy*  Lab Work: BMET, CBC-TODAY If you have labs (blood work) drawn today and your tests are completely normal, you will receive your results only by: MyChart Message (if you have MyChart) OR A paper copy in the mail If you have any lab test that is abnormal or we need to change your treatment, we will call you to review the results.  Testing/Procedures: See letter  Follow-Up: At [facility], you and your health needs are our priority.  As part of our continuing mission to provide you with exceptional heart care, our providers are all part of one team.  This team includes your primary Cardiologist (physician) and Advanced Practice Providers or APPs (Physician Assistants and Nurse Practitioners) who all work together to provide you with the care you need, when you need it.  Your next appointment:   As scheduled  We recommend signing up for the patient portal called MyChart.  Sign up information is provided on this After Visit Summary.  MyChart is used to connect with patients for Virtual Visits (Telemedicine).  Patients are able to view lab/test results, encounter notes, upcoming appointments, etc.  Non-urgent messages can be sent to your provider as well.   To learn more about what you can do with MyChart, go to [email or web address].

## 2024-07-15 ENCOUNTER — Ambulatory Visit: Payer: Self-pay | Admitting: Student

## 2024-07-15 LAB — CBC
Hematocrit: 43.9 % (ref 37.5–51.0)
Hemoglobin: 14.2 g/dL (ref 13.0–17.7)
MCH: 32.3 pg (ref 26.6–33.0)
MCHC: 32.3 g/dL (ref 31.5–35.7)
MCV: 100 fL — ABNORMAL HIGH (ref 79–97)
Platelets: 179 x10E3/uL (ref 150–450)
RBC: 4.4 x10E6/uL (ref 4.14–5.80)
RDW: 12.3 % (ref 11.6–15.4)
WBC: 8.1 x10E3/uL (ref 3.4–10.8)

## 2024-07-15 LAB — BASIC METABOLIC PANEL WITH GFR
BUN/Creatinine Ratio: 13 (ref 10–24)
BUN: 16 mg/dL (ref 8–27)
CO2: 24 mmol/L (ref 20–29)
Calcium: 9.4 mg/dL (ref 8.6–10.2)
Chloride: 104 mmol/L (ref 96–106)
Creatinine, Ser: 1.19 mg/dL (ref 0.76–1.27)
Glucose: 97 mg/dL (ref 70–99)
Potassium: 4.8 mmol/L (ref 3.5–5.2)
Sodium: 142 mmol/L (ref 134–144)
eGFR: 62 mL/min/1.73 (ref >59)

## 2024-07-23 ENCOUNTER — Telehealth: Payer: Self-pay

## 2024-07-23 NOTE — Telephone Encounter (Signed)
 Confirmed procedure date of 07/31/2024. Confirmed arrival time of 0530 for procedure time at 0730. Reviewed pre-procedure instructions with patient. Contrast allergy? No PPM or defibrillator? No Patient is aware last day to take Jardiance  is Sunday, 11/30. The patient understands to call if questions/concerns arise prior to procedure. The patient was grateful for call and agreed with plan.

## 2024-07-29 ENCOUNTER — Telehealth: Payer: Self-pay | Admitting: Physician Assistant

## 2024-07-29 NOTE — Telephone Encounter (Signed)
 1. Which medications need to be refilled? (please list name of each medication and dose if known)     potassium chloride  SA (KLOR-CON  M) 10 MEQ tablet  losartan  (COZAAR ) 25 MG tablet    2. Would you like to learn more about the convenience, safety, & potential cost savings by using the [facility]? no     3. Are you open to using the [facility] (Type [facility]. no     4. Which pharmacy/location (including street and city if local pharmacy) is medication to be sent to?   [id number] - [place], [place] - [address] AT [address]     5. Do they need a 30 day or 90 day supply? 90 days.

## 2024-07-30 ENCOUNTER — Other Ambulatory Visit (HOSPITAL_COMMUNITY): Payer: Self-pay | Admitting: Cardiology

## 2024-07-30 MED ORDER — POTASSIUM CHLORIDE CRYS ER 10 MEQ PO TBCR: 20.0000 meq | EXTENDED_RELEASE_TABLET | Freq: Every day | ORAL | 0 refills | Status: AC

## 2024-07-30 NOTE — Telephone Encounter (Signed)
 Pt scheduled to see Glendia Ferrier, 09/10/24.  Refill sent of Potassium.  Losartan  is already at pharmacy.

## 2024-07-31 ENCOUNTER — Inpatient Hospital Stay (HOSPITAL_COMMUNITY): Admitting: Certified Registered Nurse Anesthetist

## 2024-07-31 ENCOUNTER — Other Ambulatory Visit (HOSPITAL_COMMUNITY): Payer: Self-pay | Admitting: Physician Assistant

## 2024-07-31 ENCOUNTER — Inpatient Hospital Stay (HOSPITAL_COMMUNITY)
Admission: RE | Admit: 2024-07-31 | Discharge: 2024-07-31 | Disposition: A | Attending: Cardiology | Admitting: Cardiology

## 2024-07-31 ENCOUNTER — Encounter (HOSPITAL_COMMUNITY): Admission: RE | Disposition: A | Payer: Self-pay | Source: Home / Self Care | Attending: Cardiology

## 2024-07-31 ENCOUNTER — Other Ambulatory Visit: Payer: Self-pay

## 2024-07-31 ENCOUNTER — Telehealth: Payer: Self-pay

## 2024-07-31 ENCOUNTER — Inpatient Hospital Stay (HOSPITAL_COMMUNITY)

## 2024-07-31 ENCOUNTER — Encounter (HOSPITAL_COMMUNITY): Payer: Self-pay | Admitting: Cardiology

## 2024-07-31 DIAGNOSIS — I4891 Unspecified atrial fibrillation: Secondary | ICD-10-CM

## 2024-07-31 DIAGNOSIS — J449 Chronic obstructive pulmonary disease, unspecified: Secondary | ICD-10-CM | POA: Diagnosis not present

## 2024-07-31 DIAGNOSIS — Z951 Presence of aortocoronary bypass graft: Secondary | ICD-10-CM | POA: Diagnosis not present

## 2024-07-31 DIAGNOSIS — I509 Heart failure, unspecified: Secondary | ICD-10-CM | POA: Diagnosis not present

## 2024-07-31 DIAGNOSIS — I110 Hypertensive heart disease with heart failure: Secondary | ICD-10-CM | POA: Diagnosis not present

## 2024-07-31 DIAGNOSIS — I4819 Other persistent atrial fibrillation: Secondary | ICD-10-CM | POA: Diagnosis not present

## 2024-07-31 DIAGNOSIS — I130 Hypertensive heart and chronic kidney disease with heart failure and stage 1 through stage 4 chronic kidney disease, or unspecified chronic kidney disease: Secondary | ICD-10-CM | POA: Diagnosis not present

## 2024-07-31 DIAGNOSIS — I4821 Permanent atrial fibrillation: Secondary | ICD-10-CM

## 2024-07-31 DIAGNOSIS — Z8673 Personal history of transient ischemic attack (TIA), and cerebral infarction without residual deficits: Secondary | ICD-10-CM | POA: Diagnosis not present

## 2024-07-31 DIAGNOSIS — I5089 Other heart failure: Secondary | ICD-10-CM | POA: Diagnosis not present

## 2024-07-31 DIAGNOSIS — N1831 Chronic kidney disease, stage 3a: Secondary | ICD-10-CM | POA: Diagnosis not present

## 2024-07-31 DIAGNOSIS — I2510 Atherosclerotic heart disease of native coronary artery without angina pectoris: Secondary | ICD-10-CM | POA: Diagnosis not present

## 2024-07-31 DIAGNOSIS — E119 Type 2 diabetes mellitus without complications: Secondary | ICD-10-CM | POA: Diagnosis not present

## 2024-07-31 DIAGNOSIS — Z87891 Personal history of nicotine dependence: Secondary | ICD-10-CM | POA: Diagnosis not present

## 2024-07-31 DIAGNOSIS — I513 Intracardiac thrombosis, not elsewhere classified: Secondary | ICD-10-CM | POA: Diagnosis not present

## 2024-07-31 DIAGNOSIS — Z8679 Personal history of other diseases of the circulatory system: Secondary | ICD-10-CM

## 2024-07-31 HISTORY — PX: LEFT ATRIAL APPENDAGE OCCLUSION: EP1229

## 2024-07-31 HISTORY — PX: TRANSESOPHAGEAL ECHOCARDIOGRAM (CATH LAB): EP1270

## 2024-07-31 LAB — TYPE AND SCREEN
ABO/RH(D): A POS
Antibody Screen: NEGATIVE

## 2024-07-31 LAB — ECHO TEE

## 2024-07-31 SURGERY — LEFT ATRIAL APPENDAGE OCCLUSION
Anesthesia: General

## 2024-07-31 MED ORDER — ONDANSETRON HCL 4 MG/2ML IJ SOLN
INTRAMUSCULAR | Status: DC | PRN
  Administered 2024-07-31: 4 mg via INTRAVENOUS

## 2024-07-31 MED ORDER — HEPARIN (PORCINE) IN NACL 2000-0.9 UNIT/L-% IV SOLN
INTRAVENOUS | Status: DC | PRN
  Administered 2024-07-31: 1000 mL

## 2024-07-31 MED ORDER — CHLORHEXIDINE GLUCONATE 0.12 % MT SOLN
OROMUCOSAL | Status: AC
  Administered 2024-07-31: 15 mL
  Filled 2024-07-31: qty 15

## 2024-07-31 MED ORDER — LACTATED RINGERS IV SOLN: INTRAVENOUS | Status: DC

## 2024-07-31 MED ORDER — CEFAZOLIN SODIUM-DEXTROSE 2-4 GM/100ML-% IV SOLN
2.0000 g | INTRAVENOUS | Status: AC
  Administered 2024-07-31: 2 g via INTRAVENOUS
  Filled 2024-07-31: qty 100

## 2024-07-31 MED ORDER — SODIUM CHLORIDE 0.9 % IV SOLN: INTRAVENOUS | Status: DC

## 2024-07-31 MED ORDER — LIDOCAINE 2% (20 MG/ML) 5 ML SYRINGE
INTRAMUSCULAR | Status: DC | PRN
  Administered 2024-07-31: 100 mg via INTRAVENOUS

## 2024-07-31 MED ORDER — HEPARIN SODIUM (PORCINE) 1000 UNIT/ML IJ SOLN
INTRAMUSCULAR | Status: AC
  Filled 2024-07-31: qty 60

## 2024-07-31 MED ORDER — ROCURONIUM BROMIDE 10 MG/ML (PF) SYRINGE
PREFILLED_SYRINGE | INTRAVENOUS | Status: DC | PRN
  Administered 2024-07-31: 60 mg via INTRAVENOUS

## 2024-07-31 MED ORDER — OXYCODONE HCL 5 MG/5ML PO SOLN: 5.0000 mg | Freq: Once | ORAL | Status: DC | PRN

## 2024-07-31 MED ORDER — FENTANYL CITRATE (PF) 100 MCG/2ML IJ SOLN: 25.0000 ug | INTRAMUSCULAR | Status: DC | PRN

## 2024-07-31 MED ORDER — FENTANYL CITRATE (PF) 100 MCG/2ML IJ SOLN
INTRAMUSCULAR | Status: AC
  Filled 2024-07-31: qty 2

## 2024-07-31 MED ORDER — PROTAMINE SULFATE 10 MG/ML IV SOLN
INTRAVENOUS | Status: AC
  Filled 2024-07-31: qty 10

## 2024-07-31 MED ORDER — PROPOFOL 10 MG/ML IV BOLUS
INTRAVENOUS | Status: DC | PRN
  Administered 2024-07-31: 130 mg via INTRAVENOUS

## 2024-07-31 MED ORDER — PHENYLEPHRINE HCL-NACL 20-0.9 MG/250ML-% IV SOLN
INTRAVENOUS | Status: DC | PRN
  Administered 2024-07-31: 20 ug/min via INTRAVENOUS

## 2024-07-31 MED ORDER — FENTANYL CITRATE (PF) 250 MCG/5ML IJ SOLN
INTRAMUSCULAR | Status: DC | PRN
  Administered 2024-07-31: 100 ug via INTRAVENOUS

## 2024-07-31 MED ORDER — PHENYLEPHRINE 80 MCG/ML (10ML) SYRINGE FOR IV PUSH (FOR BLOOD PRESSURE SUPPORT)
PREFILLED_SYRINGE | INTRAVENOUS | Status: DC | PRN
  Administered 2024-07-31: 80 ug via INTRAVENOUS

## 2024-07-31 MED ORDER — ACETAMINOPHEN 10 MG/ML IV SOLN: 1000.0000 mg | Freq: Once | INTRAVENOUS | Status: DC | PRN

## 2024-07-31 MED ORDER — SUGAMMADEX SODIUM 200 MG/2ML IV SOLN
INTRAVENOUS | Status: DC | PRN
  Administered 2024-07-31: 254 mg via INTRAVENOUS

## 2024-07-31 MED ORDER — OXYCODONE HCL 5 MG PO TABS: 5.0000 mg | ORAL_TABLET | Freq: Once | ORAL | Status: DC | PRN

## 2024-07-31 MED ORDER — ONDANSETRON HCL 4 MG/2ML IJ SOLN: 4.0000 mg | Freq: Once | INTRAMUSCULAR | Status: DC | PRN

## 2024-07-31 SURGICAL SUPPLY — 10 items
KIT HEART LEFT (KITS) ×1 IMPLANT
PACK CARDIAC CATHETERIZATION (CUSTOM PROCEDURE TRAY) ×1 IMPLANT
PAD DEFIB RADIO PHYSIO CONN (PAD) ×1 IMPLANT
SHEATH PINNACLE 8F 10CM (SHEATH) IMPLANT
SHEATH PROBE COVER 6X72 (BAG) ×1 IMPLANT
SHIELD RADPAD SCOOP 12X17 (MISCELLANEOUS) ×1 IMPLANT
SYR CONTROL 10ML ANGIOGRAPHIC (SYRINGE) IMPLANT
TRANSDUCER W/STOPCOCK (MISCELLANEOUS) ×1 IMPLANT
TUBING ART PRESS 72 MALE/FEM (TUBING) IMPLANT
TUBING CIL FLEX 10 FLL-RA (TUBING) ×1 IMPLANT

## 2024-07-31 NOTE — H&P (Signed)
 Electrophysiology Office Follow up Visit Note:     Date:  07/31/2024    ID:  [name], [name] 11-17-1945, MRN [id number]   PCP:  Shona Norleen PEDLAR, MD           [facility] Cardiologist:  Oneil Parchment, MD  [facility] Electrophysiologist:  [name], MD      Interval History:       [name] is a 78 y.o. male who presents for a follow up visit.    I last saw the patient August 28, 2023.  The patient has a history of coronary artery disease, heart failure and atrial fibrillation.  He had a prior fall complicated by subdural hematoma while on Xarelto .  This required a bur hole for hematoma evacuation.  His atrial fibrillation is permanent. At the last appointment we discussed watchman and he was interested in proceeding.  We were awaiting neurology clearance prior to proceeding.   The patient was seen by Arley helling in neurosurgery in February of this year.  Dr. Helling provided clearance for him to undergo evaluation for watchman and to use anticoagulation around the time of the procedure.   He has been doing well since I last saw him.  He is interested in proceeding with watchman evaluation in an effort to avoid indefinite exposure anticoagulation while protecting against stroke.  Presents for LAAO today. Procedure reviewed.  Objective Past medical, surgical, social and family history were reviewed.   ROS:   Please see the history of present illness.    All other systems reviewed and are negative.   EKGs/Labs/Other Studies Reviewed:     The following studies were reviewed today:   May 13, 2023 echo EF 55-60 RV looked normal Trivial MR Mild to moderate TR             Physical Exam:     VS:  BP 139/95   Pulse 67   Ht 5' 5 (1.651 m)   SpO2 95%   BMI 24.10 kg/m         Wt Readings from Last 3 Encounters:  08/28/23 144 lb 12.8 oz (65.7 kg)  07/16/23 143 lb (64.9 kg)  06/28/23 138 lb 3.2 oz (62.7 kg)      GEN: no distress.  Elderly CARD:  Irregularly irregular, No MRG RESP: No IWOB. CTAB.     Assessment ASSESSMENT:     1. Permanent atrial fibrillation (HCC)   2. History of subdural hematoma     PLAN:     In order of problems listed above:   #Atrial fibrillation #History of subdural hematoma, traumatic Have previously seen the patient to discuss Watchman implant as a stroke risk mitigation strategy given his history of subdural hematoma.  Since I last saw him he has been evaluated by neurosurgery who cleared him to restart a short course of anticoagulation around the time of watchman implant.  I think he is at acceptable risk to undergo the procedure although he is certainly at an increased risk of subdural hematoma compared to the general population.  He understands this risk and wishes to proceed.   ------------   I have seen [name] in the office today who is being considered for a Watchman left atrial appendage closure device. I believe they will benefit from this procedure given their history of atrial fibrillation, CHA2DS2-VASc score of 5. Unfortunately, the patient is not felt to be a long term anticoagulation candidate secondary to history of intracranial hemorrhage (  SDH). The patient's chart has been reviewed and I feel that they would be a candidate for short term oral anticoagulation after Watchman implant.    It is my belief that after undergoing a LAA closure procedure, [name] will not need long term anticoagulation which eliminates anticoagulation side effects and major bleeding risk.    Procedural risks for the Watchman implant have been reviewed with the patient including a 0.5% risk of stroke, <1% risk of perforation and <1% risk of device embolization. Other risks include bleeding, vascular damage, tamponade, worsening renal function, and death. The patient understands these risk and wishes to proceed.       The published clinical data on the safety and effectiveness of WATCHMAN include but  are not limited to the following: - Holmes DR, Jess BEARD, Sick P et al. for the PROTECT AF Investigators. Percutaneous closure of the left atrial appendage versus warfarin therapy for prevention of stroke in patients with atrial fibrillation: a randomised non-inferiority trial. Lancet 2009; 374: 534-42. GLENWOOD Jess BEARD, Doshi SK, Jonita VEAR Satchel D et al. on behalf of the PROTECT AF Investigators. Percutaneous Left Atrial Appendage Closure for Stroke Prophylaxis in Patients With Atrial Fibrillation 2.3-Year Follow-up of the PROTECT AF (Watchman Left Atrial Appendage System for Embolic Protection in Patients With Atrial Fibrillation) Trial. Circulation 2013; 127:720-729. - Alli O, Doshi S,  Kar S, Reddy VY, Sievert H et al. Quality of Life Assessment in the Randomized PROTECT AF (Percutaneous Closure of the Left Atrial Appendage Versus Warfarin Therapy for Prevention of Stroke in Patients With Atrial Fibrillation) Trial of Patients at Risk for Stroke With Nonvalvular Atrial Fibrillation. J Am Coll Cardiol 2013; 61:1790-8. GLENWOOD Satchel DR, Archer RAMAN, Price M, Whisenant B, Sievert H, Doshi S, Huber K, Reddy V. Prospective randomized evaluation of the Watchman left atrial appendage Device in patients with atrial fibrillation versus long-term warfarin therapy; the PREVAIL trial. Journal of the [employer] of Cardiology, Vol. 4, No. 1, 2014, 1-11. - Kar S, Doshi SK, Sadhu A, Horton R, Osorio J et al. Primary outcome evaluation of a next-generation left atrial appendage closure device: results from the PINNACLE FLX trial. Circulation 2021;143(18)1754-1762.      After today's visit with the patient which was dedicated solely for shared decision making visit regarding LAA closure device, the patient decided to proceed with the LAA appendage closure procedure scheduled to be done in the near future at [facility].     Will avoid CT scanning given previous cross-sectional imaging and slightly elevated creatinine.      HAS-BLED score 3 Hypertension Yes  Abnormal renal and liver function (Dialysis, transplant, Cr >2.26 mg/dL /Cirrhosis or Bilirubin >2x Normal or AST/ALT/AP >3x Normal) No  Stroke No  Bleeding Yes  Labile INR (Unstable/high INR) No  Elderly (>65) Yes  Drugs or alcohol (>= 8 drinks/week, anti-plt or NSAID) No    CHA2DS2-VASc Score = 5  The patient's score is based upon: CHF History: 1 HTN History: 1 Diabetes History: 0 Stroke History: 0 Vascular Disease History: 1 Age Score: 2 Gender Score: 0     Presents for LAAO today. Procedure reviewed.   Signed, Ole Holts, MD, [facility], [facility] 07/31/2024 Electrophysiology [facility]

## 2024-07-31 NOTE — Anesthesia Preprocedure Evaluation (Signed)
 Anesthesia Evaluation  Patient identified by MRN, date of birth, ID band Patient awake    Reviewed: Allergy & Precautions, NPO status , Patient's Chart, lab work & pertinent test results, reviewed documented beta blocker date and time   History of Anesthesia Complications Negative for: history of anesthetic complications  Airway Mallampati: II  TM Distance: >3 FB     Dental  (+) Edentulous Upper, Missing   Pulmonary neg sleep apnea, COPD,  COPD inhaler, neg recent URI, former smoker, neg PE   breath sounds clear to auscultation       Cardiovascular hypertension, + CAD, + CABG and +CHF  (-) Past MI and (-) Cardiac Stents + dysrhythmias  Rhythm:Regular Rate:Normal  IMPRESSIONS     1. Left ventricular ejection fraction, by estimation, is 55 to 60%. The  left ventricle has normal function. Left ventricular endocardial border  not optimally defined to evaluate regional wall motion. There is mild left  ventricular hypertrophy. Left  ventricular diastolic parameters are indeterminate.   2. Right ventricular systolic function is low normal. The right  ventricular size is normal. There is mildly elevated pulmonary artery  systolic pressure.   3. Left atrial size was moderately dilated.   4. Right atrial size was mildly dilated.   5. The mitral valve is normal in structure. Trivial mitral valve  regurgitation. No evidence of mitral stenosis.   6. The tricuspid valve is abnormal. Tricuspid valve regurgitation is mild  to moderate.   7. The aortic valve is tricuspid. There is mild calcification of the  aortic valve. There is mild thickening of the aortic valve. Aortic valve  regurgitation is not visualized. No aortic stenosis is present.   8. The inferior vena cava is normal in size with greater than 50%  respiratory variability, suggesting right atrial pressure of 3 mmHg.      Neuro/Psych  Headaches, neg Seizures PSYCHIATRIC  DISORDERS Anxiety        GI/Hepatic hiatal hernia,,,(+) neg Cirrhosis        Endo/Other  diabetes    Renal/GU CRFRenal disease     Musculoskeletal   Abdominal   Peds  Hematology   Anesthesia Other Findings   Reproductive/Obstetrics                              Anesthesia Physical Anesthesia Plan  ASA: 3  Anesthesia Plan: General   Post-op Pain Management:    Induction: Intravenous  PONV Risk Score and Plan: 2 and Ondansetron  and Dexamethasone   Airway Management Planned: Oral ETT  Additional Equipment:   Intra-op Plan:   Post-operative Plan: Extubation in OR  Informed Consent: I have reviewed the patients History and Physical, chart, labs and discussed the procedure including the risks, benefits and alternatives for the proposed anesthesia with the patient or authorized representative who has indicated his/her understanding and acceptance.     Dental advisory given  Plan Discussed with: CRNA  Anesthesia Plan Comments:          Anesthesia Quick Evaluation

## 2024-07-31 NOTE — Transfer of Care (Signed)
 Immediate Anesthesia Transfer of Care Note  Patient: [name]  Procedure(s) Performed: LEFT ATRIAL APPENDAGE OCCLUSION TRANSESOPHAGEAL ECHOCARDIOGRAM  Patient Location: Cath Lab  Anesthesia Type:General  Level of Consciousness: awake, alert , and oriented  Airway & Oxygen Therapy: Patient Spontanous Breathing and Patient connected to face mask oxygen  Post-op Assessment: Report given to RN, Post -op Vital signs reviewed and stable, Patient moving all extremities X 4, and Patient able to stick tongue midline  Post vital signs: Reviewed and stable  Last Vitals:  Vitals Value Taken Time  BP 120/72 07/31/24 08:15  Temp 98.6   Pulse 69 07/31/24 08:17  Resp 11 07/31/24 08:17  SpO2 96 % 07/31/24 08:17  Vitals shown include unfiled device data.  Last Pain:  Vitals:   07/31/24 0625  PainSc: 0-No pain         Complications: No notable events documented.

## 2024-07-31 NOTE — Progress Notes (Signed)
 Client's wife in and states Dr Cindie spoke to her and her sister and advised to resume eliquis  today and client's wife voiced understanding

## 2024-07-31 NOTE — Anesthesia Postprocedure Evaluation (Signed)
 Anesthesia Post Note  Patient: [name]  Procedure(s) Performed: LEFT ATRIAL APPENDAGE OCCLUSION TRANSESOPHAGEAL ECHOCARDIOGRAM     Patient location during evaluation: PACU Anesthesia Type: General Level of consciousness: awake and alert Pain management: pain level controlled Vital Signs Assessment: post-procedure vital signs reviewed and stable Respiratory status: spontaneous breathing, nonlabored ventilation, respiratory function stable and patient connected to nasal cannula oxygen Cardiovascular status: blood pressure returned to baseline and stable Postop Assessment: no apparent nausea or vomiting Anesthetic complications: no   No notable events documented.  Last Vitals:  Vitals:   07/31/24 0915 07/31/24 0930  BP: 138/81 137/81  Pulse: 68 64  Resp: 14 13  Temp:    SpO2: (!) 80% 100%    Last Pain:  Vitals:   07/31/24 0922  TempSrc:   PainSc: 0-No pain                 Lynwood MARLA Cornea

## 2024-07-31 NOTE — Telephone Encounter (Signed)
 Received a message from Dr. Cindie that LAAO was cancelled 07/31/2024 due to clot in LAA during TEE.   This will be considered a cancelled case.  Follow up appointment arranged with Dr. Kennyth per Dr. Hiram request.

## 2024-07-31 NOTE — Anesthesia Procedure Notes (Signed)
 Procedure Name: Intubation Date/Time: 07/31/2024 7:32 AM  Performed by: Harrold Macintosh, CRNAPre-anesthesia Checklist: Patient identified, Emergency Drugs available, Suction available and Patient being monitored Patient Re-evaluated:Patient Re-evaluated prior to induction Oxygen Delivery Method: Circle system utilized Preoxygenation: Pre-oxygenation with 100% oxygen Induction Type: IV induction Ventilation: Mask ventilation without difficulty Laryngoscope Size: Miller and 2 Grade View: Grade I Tube type: Oral Tube size: 7.5 mm Number of attempts: 1 Airway Equipment and Method: Stylet Placement Confirmation: ETT inserted through vocal cords under direct vision, positive ETCO2 and breath sounds checked- equal and bilateral Secured at: 22 cm Tube secured with: Tape Dental Injury: Teeth and Oropharynx as per pre-operative assessment

## 2024-08-01 ENCOUNTER — Encounter (HOSPITAL_COMMUNITY): Payer: Self-pay | Admitting: Cardiology

## 2024-08-01 ENCOUNTER — Other Ambulatory Visit (HOSPITAL_COMMUNITY): Payer: Self-pay | Admitting: Cardiology

## 2024-08-01 NOTE — Telephone Encounter (Signed)
 Spoke with patient as he was cancelled LAAO 07/31/24 due to clot in LAA.  Confirmed follow up appointment with Dr. Kennyth 2/18 at 10:30 AM. Reminded patient to not miss any doses of Eliquis . Reviewed signs of stroke and to call 911 if he demonstrates any of those signs. He verbalized understanding and was grateful for the call.

## 2024-08-04 ENCOUNTER — Telehealth: Payer: Self-pay

## 2024-08-04 NOTE — Telephone Encounter (Signed)
 Called pt to rescheduled appointment and added to wait list.

## 2024-08-04 NOTE — Progress Notes (Deleted)
 Impression/Assessment:  1.  History of urinary retention from shingles of the pelvic region, resolved  2.  Nocturia.  He does empty well.   Plan:     History of Present Illness:   10.14.2025: 78 year old male comes in today for evaluation/management of lower urinary tract symptoms, specifically nocturia.  He was seen here 2 to 3 months ago for urinary retention.  He developed shingles of the genital area and had temporary urinary retention.  His catheter is now out.  He is on alfuzosin .  He feels like he empties well.  His biggest issue is nocturia.  He has some mild daytime urgency but no daytime frequency.  He wakes up between 2 and 5 times a night to urinate.  He does not drink caffeine in the afternoon or evening.  He does try to limit his fluid intake in the evening.  12.9.2025:  Past Medical History:  Diagnosis Date   AAA (abdominal aortic aneurysm)    06/27/20 MRI: 3.0 cm infrarenal AAA, recommend 3 year follow-up   Anxiety    Atrial fibrillation (HCC)    Bilateral carotid artery disease 07/21/2013   CAD (coronary artery disease) 2008   CABG   COPD (chronic obstructive pulmonary disease) (HCC)    Dysrhythmia    A-fib   Gastritis    Headache(784.0)    History of hiatal hernia    Hypertension    SDH (subdural hematoma) (HCC) 01/31/2023   Visit for monitoring Tikosyn  therapy 12/2015    Past Surgical History:  Procedure Laterality Date   BURR HOLE Left 03/27/2023   Procedure: BURR HOLE EVASCUATION OF SUBDURAL HEMATOMA;  Surgeon: Cheryle Debby LABOR, MD;  Location: [facility] OR;  Service: Neurosurgery;  Laterality: Left;   CARDIOVERSION N/A 09/08/2015   Procedure: CARDIOVERSION;  Surgeon: Aleene JINNY Passe, MD;  Location: [facility] ENDOSCOPY;  Service: Cardiovascular;  Laterality: N/A;   CORONARY ARTERY BYPASS GRAFT     EXCISION OF KELOID Left 10/08/2018   Procedure: EXCISION OF CHRONIC ABDOMINAL WALL WOUND;  Surgeon: Vernetta Berg, MD;  Location: [facility];   Service: General;  Laterality: Left;   INCISION AND DRAINAGE DEEP NECK ABSCESS     IR ANGIO EXTERNAL CAROTID SEL EXT CAROTID UNI L MOD SED  04/17/2023   IR ANGIO INTRA EXTRACRAN SEL COM CAROTID INNOMINATE UNI L MOD SED  04/17/2023   IR NEURO EACH ADD'L AFTER BASIC UNI LEFT (MS)  04/17/2023   LEFT ATRIAL APPENDAGE OCCLUSION N/A 07/31/2024   Procedure: LEFT ATRIAL APPENDAGE OCCLUSION;  Surgeon: Cindie Ole DASEN, MD;  Location: [facility] INVASIVE CV LAB;  Service: Cardiovascular;  Laterality: N/A;   LUMBAR LAMINECTOMY/DECOMPRESSION MICRODISCECTOMY Right 08/09/2020   Procedure: Laminectomy and Foraminotomy - Lumbar three-Lumbar four - Lumbar four-Lumbar five- right;  Surgeon: Onetha Kuba, MD;  Location[facility] OR;  Service: Neurosurgery;  Laterality: Right;   RADIOLOGY WITH ANESTHESIA N/A 04/17/2023   Procedure: Left MMA;  Surgeon: Lanis Pupa, MD;  Location: [facility] OR;  Service: Radiology;  Laterality: N/A;   ROTATOR CUFF REPAIR Bilateral    TEE WITHOUT CARDIOVERSION N/A 08/02/2015   Procedure: TRANSESOPHAGEAL ECHOCARDIOGRAM (TEE);  Surgeon: Wilbert JONELLE Bihari, MD;  Location: [facility] ENDOSCOPY;  Service: Cardiovascular;  Laterality: N/A;   TEE WITHOUT CARDIOVERSION N/A 09/08/2015   Procedure: TRANSESOPHAGEAL ECHOCARDIOGRAM (TEE);  Surgeon: Aleene JINNY Passe, MD;  Location: [facility] ENDOSCOPY;  Service: Cardiovascular;  Laterality: N/A;   TRANSESOPHAGEAL ECHOCARDIOGRAM (CATH LAB) N/A 07/31/2024   Procedure: TRANSESOPHAGEAL ECHOCARDIOGRAM;  Surgeon: Cindie Ole DASEN,  MD;  Location: [facility] INVASIVE CV LAB;  Service: Cardiovascular;  Laterality: N/A;    Home Medications:  Allergies as of 08/05/2024       Reactions   Vibramycin [doxycycline] Hives, Shortness Of Breath   Increased urinary frequency   Asa [aspirin] Hypertension   Told to avoid due to heart condition   Nsaids Hypertension   Told to avoid due to heart condition        Medication List        Accurate as of August 04, 2024 12:22 PM. If you have any  questions, ask your nurse or doctor.          Acetaminophen  Extra Strength 500 MG Tabs Take 1 tablet (500 mg total) by mouth every 6 (six) hours for 8 days then as directed by MD   albuterol  108 (90 Base) MCG/ACT inhaler Commonly known as: VENTOLIN  HFA Inhale 2 puffs into the lungs every 4 (four) hours as needed.   alfuzosin  10 MG 24 hr tablet Commonly known as: UROXATRAL  Take 1 tablet (10 mg total) by mouth daily with breakfast.   ALPRAZolam  0.25 MG tablet Commonly known as: XANAX  TAKE 1 TABLET(0.25 MG) BY MOUTH DAILY   apixaban  2.5 MG Tabs tablet Commonly known as: ELIQUIS  Take 1 tablet (2.5 mg total) by mouth 2 (two) times daily.   ascorbic acid  500 MG tablet Commonly known as: VITAMIN C Take 500 mg by mouth at bedtime.   atorvastatin  40 MG tablet Commonly known as: LIPITOR TAKE 1 TABLET(40 MG) BY MOUTH DAILY   CENTRUM SILVER PO Take 1 tablet by mouth at bedtime.   CORICIDIN HBP MAX STRENGTH FLU PO Take 15 mLs by mouth daily as needed (cough/COPD).   diltiazem  30 MG tablet Commonly known as: CARDIZEM  TAKE 1 TABLET BY MOUTH EVERY 4 HOURS AS NEEDED FOR HEART RATE>100 AS LONG AS BLOOD PRESSURE>100   empagliflozin  10 MG Tabs tablet Commonly known as: Jardiance  Take 1 tablet (10 mg total) by mouth daily.   Fish Oil 1200 MG Caps Take 1,200 mg by mouth at bedtime.   furosemide  20 MG tablet Commonly known as: Lasix  Take 1 tablet (20 mg total) by mouth daily. What changed: when to take this   loperamide 2 MG tablet Commonly known as: IMODIUM A-D Take 2 mg by mouth as needed for diarrhea or loose stools.   losartan  25 MG tablet Commonly known as: COZAAR  TAKE 1 TABLET(25 MG) BY MOUTH DAILY   metoprolol  succinate 25 MG 24 hr tablet Commonly known as: Toprol  XL Take 1 tablet (25 mg total) by mouth in the morning and at bedtime.   nitroGLYCERIN  0.4 MG SL tablet Commonly known as: NITROSTAT  Place 1 tablet (0.4 mg total) under the tongue every 5 (five) minutes  as needed for chest pain.   oxybutynin  5 MG tablet Commonly known as: DITROPAN  1 po qhs   potassium chloride  10 MEQ tablet Commonly known as: KLOR-CON  M Take 2 tablets (20 mEq total) by mouth daily.   TUSSIN COUGH PO Take 15 mLs by mouth daily as needed (Cough/COPD).        Allergies:  Allergies  Allergen Reactions   Vibramycin [Doxycycline] Hives and Shortness Of Breath    Increased urinary frequency   Asa [Aspirin] Hypertension    Told to avoid due to heart condition   Nsaids Hypertension    Told to avoid due to heart condition    Family History  Problem Relation Age of Onset   Diabetes Mother  Hypertension Mother    Heart attack Father    Sudden death Father    Hypertension Sister    Diabetes Sister     Social History:  reports that he quit smoking about 65 years ago. His smoking use included cigarettes. He quit smokeless tobacco use about 23 months ago.  His smokeless tobacco use included chew. He reports that he does not drink alcohol and does not use drugs.  ROS: A complete review of systems was performed.  All systems are negative except for pertinent findings as noted.  Physical Exam:  Vital signs in last 24 hours: There were no vitals taken for this visit. Constitutional:  Alert and oriented, No acute distress Cardiovascular: Regular rate  Respiratory: Normal respiratory effort GI: Abdomen is soft, nontender, nondistended, no abdominal masses. No CVAT.  Genitourinary: Normal male phallus, testes are descended bilaterally and non-tender and without masses, scrotum is normal in appearance without lesions or masses, perineum is normal on inspection. Prostate 25 mL,, non nodular Lymphatic: No lymphadenopathy Neurologic: Grossly intact, no focal deficits Psychiatric: Normal mood and affect  I have reviewed prior pt notes  I have reviewed notes from referring/previous physicians--ER notes  I have reviewed urinalysis results-clear  I have independently  reviewed prior imaging--bladder scan volume today 25 mL

## 2024-08-05 ENCOUNTER — Ambulatory Visit: Admitting: Urology

## 2024-08-11 ENCOUNTER — Other Ambulatory Visit: Payer: Self-pay | Admitting: Cardiology

## 2024-09-02 ENCOUNTER — Telehealth: Payer: Self-pay | Admitting: Cardiology

## 2024-09-02 NOTE — Telephone Encounter (Signed)
 Returned call to wife, made aware the  2/18 is an OV, not the procedure.  Aware the procedure has not been rescheduled at this time while awaiting resolution of thrombus.  Explained that pt will need follow up testing before rescheduling, but this will be discussed at next months OV with Dr. Kennyth. Aware ok to proceed with immunizations next week. Wife appreciates the follow up call and agreeable to plan.

## 2024-09-02 NOTE — Telephone Encounter (Signed)
 PT spouse wants to know if PT would be receiving the watchman procedure during his 10/15/24 appt and if so would the RSV and Pneumonia shot PT will get at his 01/14 appt affect the procedure. Please advise.

## 2024-09-09 ENCOUNTER — Ambulatory Visit: Attending: Physician Assistant | Admitting: Physician Assistant

## 2024-09-09 ENCOUNTER — Encounter: Payer: Self-pay | Admitting: Physician Assistant

## 2024-09-09 VITALS — BP 124/66 | HR 63 | Ht 65.0 in | Wt 139.0 lb

## 2024-09-09 DIAGNOSIS — I4821 Permanent atrial fibrillation: Secondary | ICD-10-CM | POA: Insufficient documentation

## 2024-09-09 DIAGNOSIS — I6523 Occlusion and stenosis of bilateral carotid arteries: Secondary | ICD-10-CM | POA: Insufficient documentation

## 2024-09-09 DIAGNOSIS — I10 Essential (primary) hypertension: Secondary | ICD-10-CM | POA: Insufficient documentation

## 2024-09-09 DIAGNOSIS — I5020 Unspecified systolic (congestive) heart failure: Secondary | ICD-10-CM | POA: Insufficient documentation

## 2024-09-09 DIAGNOSIS — E7800 Pure hypercholesterolemia, unspecified: Secondary | ICD-10-CM | POA: Diagnosis not present

## 2024-09-09 DIAGNOSIS — R0602 Shortness of breath: Secondary | ICD-10-CM | POA: Insufficient documentation

## 2024-09-09 DIAGNOSIS — I25118 Atherosclerotic heart disease of native coronary artery with other forms of angina pectoris: Secondary | ICD-10-CM | POA: Insufficient documentation

## 2024-09-09 LAB — BASIC METABOLIC PANEL WITH GFR
BUN/Creatinine Ratio: 16 (ref 10–24)
BUN: 23 mg/dL (ref 8–27)
CO2: 20 mmol/L (ref 20–29)
Calcium: 9.3 mg/dL (ref 8.6–10.2)
Chloride: 108 mmol/L — ABNORMAL HIGH (ref 96–106)
Creatinine, Ser: 1.43 mg/dL — ABNORMAL HIGH (ref 0.76–1.27)
Glucose: 66 mg/dL — ABNORMAL LOW (ref 70–99)
Potassium: 5 mmol/L (ref 3.5–5.2)
Sodium: 143 mmol/L (ref 134–144)
eGFR: 50 mL/min/1.73 — ABNORMAL LOW

## 2024-09-09 LAB — PRO B NATRIURETIC PEPTIDE: NT-Pro BNP: 3024 pg/mL — ABNORMAL HIGH (ref 0–486)

## 2024-09-09 NOTE — Assessment & Plan Note (Signed)
Recent LDL optimal -Continue Lipitor 40mg  daily.

## 2024-09-09 NOTE — Assessment & Plan Note (Addendum)
 Ultrasound in January 2025 with 60-79% right ICA stenosis.  Repeat ultrasound pending later this month.

## 2024-09-09 NOTE — Assessment & Plan Note (Addendum)
 Probable tachycardia mediated cardiomyopathy.  EF previously 35-40.  Most recent TEE in December 2025 with EF 55-60.  Currently, he does not seem to be volume overloaded.  However, question if volume excess could be contributing to his shortness of breath. - Obtain NT-proBNP as noted - Increase furosemide  if BNP elevated and consider repeat complete echocardiogram - Continue Jardiance  10 mg daily, losartan  25 mg daily, metoprolol  succinate 25 g twice daily

## 2024-09-09 NOTE — Assessment & Plan Note (Addendum)
 Status post CABG in 2008.  As noted, he has not had chest discomfort.  Question of shortness of breath with exertion could be an anginal equivalent. - Order stress PET MPI as noted - Continue Lipitor 40 mg daily, nitroglycerin  as needed, metoprolol  succinate 25 mg twice daily

## 2024-09-09 NOTE — Patient Instructions (Addendum)
 Medication Instructions:  REMEMBER TO TO CONTINUE TO TAKE THE DILTIAZEM  WHEN YOU'RE HAVING HIGH BLOOD PRESSURE AND FAST HEART  RATE  KEEP YOUR APPOINTMENT NEXT MONTH WITH DR. PARKER  *If you need a refill on your cardiac medications before your next appointment, please call your pharmacy*   Lab Work: BMET, PRO BNP If you have labs (blood work) drawn today and your tests are completely normal, you will receive your results only by: MyChart Message (if you have MyChart) OR A paper copy in the mail If you have any lab test that is abnormal or we need to change your treatment, we will call you to review the results.   Testing/Procedures: CARDIAC PET- Your physician has requested that you have a Cardiac Pet Stress Test.   This testing is completed at [facility] ([address] [place], [place] [address]) or [facility] ([address] [place], [place]). Please arrive 30 minutes prior to your scheduled time.  The schedulers will call you to get this scheduled. Please follow further testing instructions below.   Your next appointment:   6 month(s)   Provider:   Oneil Parchment, MD     Follow-Up: At [facility], you and your health needs are our priority.  As part of our continuing mission to provide you with exceptional heart care, we have created designated Provider Care Teams.  These Care Teams include your primary Cardiologist (physician) and Advanced Practice Providers (APPs -  Physician Assistants and Nurse Practitioners) who all work together to provide you with the care you need, when you need it. We recommend signing up for the patient portal called MyChart.  Sign up information is provided on this After Visit Summary.  MyChart is used to connect with patients for Virtual Visits (Telemedicine).  Patients are able to view lab/test results, encounter notes, upcoming appointments, etc.  Non-urgent messages can be sent to your  provider as well.   To learn more about what you can do with MyChart, go to [email or web address].

## 2024-09-09 NOTE — Assessment & Plan Note (Signed)
 Blood pressure fairly well controlled.  He has had some elevated blood pressures at home. - Continue diltiazem  30 mg every 4 hours as needed for elevated blood pressure/heart rate - Continue metoprolol  succinate 25 mg twice daily, losartan  25 mg daily

## 2024-09-09 NOTE — Progress Notes (Addendum)
 "     OFFICE NOTE:    Date:  09/09/2024  ID:  [name], DOB Sep 30, 1945, MRN [id number] PCP: Shona Norleen PEDLAR, MD  [facility] Providers Cardiologist:  Oneil Parchment, MD Electrophysiologist:  [name], MD (Inactive)        Coronary artery disease S/p CABG 10/31/06 Permanent atrial fibrillation S/p DCCV in 10/31/2014, 2015/11/01  Rx: Dofetilide  >> DC'd in 05/2023 (off anticoag due to SDH, failed AAD Rx) Anticoagulation DC'd in 01/2023 2/2 subdural hematoma HFimpEF (heart failure with improved ejection fraction)  Tachy mediated?? TEE 09/08/2015: EF 35-40 TTE 01/25/2016: EF 45-50, GLS -13, trivial MR, severe LAE, mild RVE, mild RAE, trivial TR, PASP 21 TTE 05/12/2023: EF 55-60, mild LVH, low normal RVSF, mildly elevated PASP, moderate LAE, mild RAE, trivial MR, mild-moderate TR, RAP 3, RVSP 40.9 TEE 07/31/2024: EF 55-60, normal RVSF, mild RAE, trivial MR, AV sclerosis, + LAA clot Abdominal aortic aneurysm  Notes: 05/2020: 3.0 cm  Carotid artery disease US  09/20/2023: R ICA 60-79, LICA 1-39 Chronic Obstructive Pulmonary Disease Hypertension  Hx of subdural hematoma S/p fall in 01/2023 >> s/p burr hole for evacuation 02/2023  Anticoagulation DC'd FHx of CAD, sudden cardiac death  S/p spine surgery November 01, 2019         Discussed the use of AI scribe software for clinical note transcription with the patient, who gave verbal consent to proceed. History of Present Illness [name] is a 79 y.o. male for follow up of CAD, AFib, CHF. He was last seen in 06/2023. He has been followed by EP since. He has a hx of subdural hematoma after a fall, ultimately requiring burr hole. He was taken off anticoagulation and he continued to have persistent atrial fibrillation. His Dofetilide  was ultimately stopped. He was started back on anticoagulation with Eliquis . He was set up for [employer]) in Dec 2025. However, TEE showed LAA thrombus. The procedure was aborted. He was asked to resumed Eliquis  with plan to  continue 2-3 mos. He has follow up with Dr. Kennyth in 10-31-2024.   He experiences shortness of breath, even when sitting still, which has been present since his head injury in July 2024.  He notes a history of COVID-19 around that time, which he believes worsened his breathing.  He has not had chest pain, wheezing, or coughing, but he becomes short of breath with minimal exertion, such as walking a short distance. He does not feel palpitations even when his heart rate is elevated according to his blood pressure machine. He has a history of smoking from age 80 until his CABG in 2006/10/31. He was told he has COPD in the past. He denies any recent weight gain, leg swelling, or passing out.    Review of Systems  Gastrointestinal:  Negative for hematochezia and melena.  Genitourinary:  Negative for hematuria.  -See HPI    Studies Reviewed:  EKG Interpretation Date/Time:  Tuesday September 09 2024 14:02:47 EST Ventricular Rate:  82 PR Interval:    QRS Duration:  74 QT Interval:  370 QTC Calculation: 432 R Axis:   -12  Text Interpretation: Atrial fibrillation with premature ventricular or aberrantly conducted complexes Septal infarct (cited on or before 09-Sep-2024) T wave abnormality V5-6 No significant change since last tracing Confirmed by Lelon Hamilton [phone]) on 09/09/2024 2:10:08 PM    LABS 01/10/2024: A1c 6.4 03/07/2024: ALT 17 05/14/2024: Total cholesterol 121, HDL 57, LDL 52, triglycerides 53 07/14/2024: K 4.8, creatinine 1.19, Hgb 14.2, PLT  179K   Risk Assessment/Calculations: CHA2DS2-VASc Score = 5   This indicates a 7.2% annual risk of stroke. The patient's score is based upon: CHF History: 1 HTN History: 1 Diabetes History: 0 Stroke History: 0 Vascular Disease History: 1 Age Score: 2 Gender Score: 0           Physical Exam:  VS:  BP 124/66   Pulse 63   Ht 5' 5 (1.651 m)   Wt 139 lb (63 kg)   SpO2 93%   BMI 23.13 kg/m        Wt Readings from Last 3 Encounters:   09/09/24 139 lb (63 kg)  07/31/24 140 lb (63.5 kg)  07/14/24 140 lb 3.2 oz (63.6 kg)    Constitutional:      Appearance: Healthy appearance. Not in distress.  Neck:     Vascular: JVD normal.  Pulmonary:     Breath sounds: Normal breath sounds. No wheezing. No rales.  Cardiovascular:     Normal rate. Irregularly irregular rhythm.     Murmurs: There is no murmur.  Edema:    Peripheral edema absent.  Abdominal:     Palpations: Abdomen is soft.       Assessment and Plan:    Assessment & Plan Shortness of breath He has chronic shortness of breath since 2024.  Shortness of breath is likely multifactorial. I suspect Chronic Obstructive Pulmonary Disease is the biggest contributor.  He has not had chest pain.  However, shortness of breath could be an anginal equivalent.  He does have a history of heart failure.  His ejection fraction has improved.  He has no evidence of volume excess on exam.  Echocardiogram in September 2024 did demonstrate mildly elevated PASP. RVSP was not calculated on recent TEE.  He notes a hx of COVID with worsening shortness of breath since.  - Order BMET, NT Pro BNP - If BNP elevated, increase furosemide  and consider repeat complete TTE. - Order Stress PET MPI to r/o ischemia.  - Will see if atten correction CT shows any evidence of parenchymal scarring - If cardiac workup unremarkable, consider referral to Pulmonology  Permanent atrial fibrillation [facility]) Patient suffered a fall in June 2024 resulting in significant subdural hematoma.  He ultimately underwent bur hole for evacuation.  He was taken off of anticoagulation and ultimately taken off of dofetilide  due to continued atrial fibrillation.  He was ultimately set up for left atrial appendage occlusion.  However, TEE in December 2025 demonstrated left atrial appendage thrombus.  Plan was to resume Eliquis  anticoagulation therapy for 2 to 3 months and follow-up with Dr. Kennyth with EP.  Of note, his weight is 63.5  kg.  I suspect he was placed on low-dose Eliquis  due to history of subdural hematoma.  He does have some episodes of elevated heart rates and blood pressures noted at home.  1 episode was recorded in the 160s. - Continue metoprolol  succinate 25 mg twice daily - Continue diltiazem  30 mg every 4 hours as needed for elevated heart rate/blood pressure - Continue Eliquis  2.5 mg twice daily - I will review with team to see if we should consider increasing Eliquis  to 5 mg (weight >60 kg, creatinine <1.5) - Follow-up with Dr. Kennyth next month as planned  ADDENDUM 09/27/2024: Discussed with Dr. Onetha (neurosurgery). If the patient needs higher dose Eliquis , we can increase it. I reviewed the patient's labs. Recent Creatinine was 1.5. I called the pt. Will continue Eliquis  2.5 mg twice daily for  now. Will repeat BMET and based dose adjustment on follow up labs.  Coronary artery disease involving native coronary artery of native heart with other form of angina pectoris Status post CABG in 2008.  As noted, he has not had chest discomfort.  Question of shortness of breath with exertion could be an anginal equivalent. 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  EF previously 35-40.  Most recent TEE in December 2025 with EF 55-60.  Currently, he does not seem to be volume overloaded.  However, question if volume excess could be contributing to his shortness of breath. - Obtain NT-proBNP as noted - Increase furosemide  if BNP elevated and consider repeat complete echocardiogram - Continue Jardiance  10 mg daily, losartan  25 mg daily, metoprolol  succinate 25 g twice daily Essential hypertension, benign Blood pressure fairly well controlled.  He has had some elevated blood pressures at home. - Continue diltiazem  30 mg every 4  hours as needed for elevated blood pressure/heart rate - Continue metoprolol  succinate 25 mg twice daily, losartan  25 mg daily Pure hypercholesterolemia Recent LDL optimal. - Continue Lipitor 40 mg daily Bilateral carotid artery stenosis Ultrasound in January 2025 with 60-79% right ICA stenosis.  Repeat ultrasound pending later this month.      Informed Consent   Shared Decision Making/Informed Consent The risks [chest pain, shortness of breath, cardiac arrhythmias, dizziness, blood pressure fluctuations, myocardial infarction, stroke/transient ischemic attack, nausea, vomiting, allergic reaction, radiation exposure, metallic taste sensation and life-threatening complications (estimated to be 1 in 10,000)], benefits (risk stratification, diagnosing coronary artery disease, treatment guidance) and alternatives of a cardiac PET stress test were discussed in detail with Mr. Nydam and he agrees to proceed.     Dispo:  Return in about 6 months (around 03/09/2025) for Routine Follow Up, w/ Dr. Jeffrie, or Glendia Ferrier, PA-C.  Signed, Glendia Ferrier, PA-C   "

## 2024-09-10 ENCOUNTER — Ambulatory Visit: Admitting: Physician Assistant

## 2024-09-11 ENCOUNTER — Ambulatory Visit: Payer: Self-pay | Admitting: Physician Assistant

## 2024-09-11 DIAGNOSIS — I4821 Permanent atrial fibrillation: Secondary | ICD-10-CM

## 2024-09-11 DIAGNOSIS — R0602 Shortness of breath: Secondary | ICD-10-CM

## 2024-09-11 DIAGNOSIS — Z79899 Other long term (current) drug therapy: Secondary | ICD-10-CM

## 2024-09-12 ENCOUNTER — Other Ambulatory Visit: Payer: Self-pay

## 2024-09-12 DIAGNOSIS — I4821 Permanent atrial fibrillation: Secondary | ICD-10-CM

## 2024-09-12 DIAGNOSIS — R0602 Shortness of breath: Secondary | ICD-10-CM

## 2024-09-12 DIAGNOSIS — Z79899 Other long term (current) drug therapy: Secondary | ICD-10-CM

## 2024-09-16 ENCOUNTER — Telehealth: Payer: Self-pay | Admitting: Physician Assistant

## 2024-09-16 ENCOUNTER — Ambulatory Visit: Admitting: Student

## 2024-09-16 NOTE — Telephone Encounter (Signed)
" °*  STAT* If patient is at the pharmacy, call can be transferred to refill team.   1. Which medications need to be refilled? (please list name of each medication and dose if known)   diltiazem  (CARDIZEM ) 30 MG tablet   losartan  (COZAAR ) 25 MG tablet   nitroGLYCERIN  (NITROSTAT ) 0.4 MG SL tablet   2. Would you like to learn more about the convenience, safety, & potential cost savings by using the [facility]? no  3. Are you open to using the [facility] (Type [facility]. no   4. Which pharmacy/location (including street and city if local pharmacy) is medication to be sent to? [id number] - [place], [place] - [address]      5. Do they need a 30 day or 90 day supply?   "

## 2024-09-17 LAB — LAB REPORT - SCANNED
A1c: 6.4
Albumin, Urine POC: 31
Creatinine, POC: 87.8 mg/dL
EGFR: 45
Microalb Creat Ratio: 35

## 2024-09-18 ENCOUNTER — Telehealth: Payer: Self-pay | Admitting: Physician Assistant

## 2024-09-18 NOTE — Telephone Encounter (Signed)
 Appt with Glendia Ferrier, PA-C has already been rescheduled for 10/06/2024.

## 2024-09-18 NOTE — Telephone Encounter (Signed)
 Pt calling to provide cell number to reach her [phone].

## 2024-09-18 NOTE — Telephone Encounter (Signed)
 Pts wife requesting c/b to reschedule 1/27 PET Stress. Please advise.

## 2024-09-19 ENCOUNTER — Ambulatory Visit (HOSPITAL_COMMUNITY)
Admission: RE | Admit: 2024-09-19 | Discharge: 2024-09-19 | Disposition: A | Source: Ambulatory Visit | Attending: Cardiology | Admitting: Cardiology

## 2024-09-19 DIAGNOSIS — I6523 Occlusion and stenosis of bilateral carotid arteries: Secondary | ICD-10-CM | POA: Diagnosis not present

## 2024-09-19 LAB — BASIC METABOLIC PANEL WITH GFR
BUN/Creatinine Ratio: 16 (ref 10–24)
BUN: 25 mg/dL (ref 8–27)
CO2: 23 mmol/L (ref 20–29)
Calcium: 9.3 mg/dL (ref 8.6–10.2)
Chloride: 106 mmol/L (ref 96–106)
Creatinine, Ser: 1.52 mg/dL — ABNORMAL HIGH (ref 0.76–1.27)
Glucose: 85 mg/dL (ref 70–99)
Potassium: 4.9 mmol/L (ref 3.5–5.2)
Sodium: 144 mmol/L (ref 134–144)
eGFR: 46 mL/min/1.73 — ABNORMAL LOW

## 2024-09-19 LAB — PRO B NATRIURETIC PEPTIDE: NT-Pro BNP: 2861 pg/mL — ABNORMAL HIGH (ref 0–486)

## 2024-09-21 ENCOUNTER — Ambulatory Visit: Payer: Self-pay | Admitting: Physician Assistant

## 2024-09-22 ENCOUNTER — Ambulatory Visit: Payer: Self-pay | Admitting: Cardiology

## 2024-09-22 DIAGNOSIS — I6523 Occlusion and stenosis of bilateral carotid arteries: Secondary | ICD-10-CM

## 2024-09-23 ENCOUNTER — Ambulatory Visit (HOSPITAL_COMMUNITY)

## 2024-09-23 MED ORDER — LOSARTAN POTASSIUM 25 MG PO TABS: 25.0000 mg | ORAL_TABLET | Freq: Every day | ORAL | 0 refills | Status: AC

## 2024-09-23 MED ORDER — DILTIAZEM HCL 30 MG PO TABS: ORAL_TABLET | ORAL | 1 refills | Status: AC

## 2024-09-23 MED ORDER — NITROGLYCERIN 0.4 MG SL SUBL: 0.4000 mg | SUBLINGUAL_TABLET | SUBLINGUAL | 3 refills | Status: AC | PRN

## 2024-09-23 NOTE — Telephone Encounter (Signed)
 Refill sent

## 2024-09-23 NOTE — Telephone Encounter (Signed)
 Spoke with pt regarding lab results. Pt verbalized understanding. Pt stated he is not having SOB. Pt was advised to call our office if he has any further concerns.

## 2024-09-24 NOTE — Telephone Encounter (Signed)
 Patient wife returning call to nurse.

## 2024-09-24 NOTE — Telephone Encounter (Signed)
 Reviewed results of carotid doppler with wife and advised to repeat per Dr Jeffrie in 1 yr.  Order placed.  Reviewed date time, location of further upcoming scheduled testing and f/u with [facility] we[facility], [place].

## 2024-09-27 NOTE — Addendum Note (Signed)
 Addended byBETHA FERRIER, GLENDIA T on: 09/27/2024 12:28 PM   Modules accepted: Orders

## 2024-09-29 ENCOUNTER — Telehealth (HOSPITAL_COMMUNITY): Payer: Self-pay | Admitting: Emergency Medicine

## 2024-09-29 NOTE — Telephone Encounter (Signed)
 Reaching out to patient to offer assistance regarding upcoming cardiac imaging study; pt verbalizes understanding of appt date/time, parking situation and where to check in, pre-test NPO status and medications ordered, and verified current allergies; name and call back number provided for further questions should they arise Rockwell Alexandria RN Navigator Cardiac Imaging [facility] [phone] office [phone] cell

## 2024-09-30 ENCOUNTER — Ambulatory Visit: Admitting: Physician Assistant

## 2024-09-30 ENCOUNTER — Ambulatory Visit (HOSPITAL_COMMUNITY): Admission: RE | Admit: 2024-09-30 | Source: Ambulatory Visit

## 2024-09-30 DIAGNOSIS — I5020 Unspecified systolic (congestive) heart failure: Secondary | ICD-10-CM

## 2024-09-30 DIAGNOSIS — R0602 Shortness of breath: Secondary | ICD-10-CM | POA: Diagnosis not present

## 2024-09-30 DIAGNOSIS — I4821 Permanent atrial fibrillation: Secondary | ICD-10-CM | POA: Diagnosis not present

## 2024-09-30 DIAGNOSIS — I25118 Atherosclerotic heart disease of native coronary artery with other forms of angina pectoris: Secondary | ICD-10-CM | POA: Diagnosis not present

## 2024-09-30 LAB — NM PET CT CARDIAC PERFUSION MULTI W/ABSOLUTE BLOODFLOW
LV dias vol: 60 mL (ref 62–150)
Nuc Rest EF: 37 %
Nuc Stress EF: 46 %
Peak HR: 126 {beats}/min
Rest HR: 85 {beats}/min
Rest Nuclear Isotope Dose: 16.7 mCi
Rest perfusion cavity size (mL): 60 mL
ST Depression (mm): 0 mm
Stress Nuclear Isotope Dose: 16.3 mCi
Stress perfusion cavity size (mL): 71 mL
TID: 1.14

## 2024-09-30 LAB — BASIC METABOLIC PANEL WITH GFR
BUN/Creatinine Ratio: 18 (ref 10–24)
BUN: 29 mg/dL — ABNORMAL HIGH (ref 8–27)
CO2: 21 mmol/L (ref 20–29)
Calcium: 9.3 mg/dL (ref 8.6–10.2)
Chloride: 108 mmol/L — ABNORMAL HIGH (ref 96–106)
Creatinine, Ser: 1.62 mg/dL — ABNORMAL HIGH (ref 0.76–1.27)
Glucose: 99 mg/dL (ref 70–99)
Potassium: 4.7 mmol/L (ref 3.5–5.2)
Sodium: 142 mmol/L (ref 134–144)
eGFR: 42 mL/min/{1.73_m2} — ABNORMAL LOW

## 2024-09-30 MED ORDER — RUBIDIUM RB82 GENERATOR (RUBYFILL)
16.2900 | PACK | Freq: Once | INTRAVENOUS | Status: AC
  Administered 2024-09-30: 16.29 via INTRAVENOUS

## 2024-09-30 MED ORDER — REGADENOSON 0.4 MG/5ML IV SOLN
INTRAVENOUS | Status: AC
  Filled 2024-09-30: qty 5

## 2024-09-30 MED ORDER — RUBIDIUM RB82 GENERATOR (RUBYFILL)
16.6600 | PACK | Freq: Once | INTRAVENOUS | Status: AC
  Administered 2024-09-30: 16.66 via INTRAVENOUS

## 2024-09-30 MED ORDER — REGADENOSON 0.4 MG/5ML IV SOLN
0.4000 mg | Freq: Once | INTRAVENOUS | Status: AC
  Administered 2024-09-30: 0.4 mg via INTRAVENOUS

## 2024-10-01 NOTE — Progress Notes (Signed)
 Wife states someone has already called with the results of this test. I do not see notes of that as of yet

## 2024-10-06 ENCOUNTER — Ambulatory Visit: Admitting: Physician Assistant

## 2024-10-15 ENCOUNTER — Ambulatory Visit: Admitting: Cardiology

## 2024-10-15 ENCOUNTER — Other Ambulatory Visit (HOSPITAL_COMMUNITY)

## 2024-12-29 ENCOUNTER — Ambulatory Visit: Admitting: Urology
